# Patient Record
Sex: Male | Born: 1960 | Race: White | Hispanic: No | Marital: Single | Smoking: Former smoker
Health system: Southern US, Community
[De-identification: ages and names within clinical notes are randomized; demographics above are authoritative.]

## PROBLEM LIST (undated history)

## (undated) DIAGNOSIS — E119 Type 2 diabetes mellitus without complications: Secondary | ICD-10-CM

## (undated) DIAGNOSIS — M1990 Unspecified osteoarthritis, unspecified site: Secondary | ICD-10-CM

## (undated) DIAGNOSIS — F32A Depression, unspecified: Secondary | ICD-10-CM

## (undated) DIAGNOSIS — K7460 Unspecified cirrhosis of liver: Secondary | ICD-10-CM

## (undated) DIAGNOSIS — F329 Major depressive disorder, single episode, unspecified: Secondary | ICD-10-CM

## (undated) HISTORY — DX: Type 2 diabetes mellitus without complications: E11.9

## (undated) HISTORY — DX: Unspecified cirrhosis of liver: K74.60

---

## 2010-03-21 ENCOUNTER — Ambulatory Visit (HOSPITAL_COMMUNITY): Admission: RE | Admit: 2010-03-21 | Discharge: 2010-03-21 | Payer: Self-pay | Admitting: Family Medicine

## 2010-05-05 ENCOUNTER — Ambulatory Visit (HOSPITAL_COMMUNITY): Admission: RE | Admit: 2010-05-05 | Discharge: 2010-05-05 | Payer: Self-pay | Admitting: Family Medicine

## 2014-08-11 ENCOUNTER — Other Ambulatory Visit (HOSPITAL_COMMUNITY): Payer: Self-pay | Admitting: Family Medicine

## 2014-08-11 ENCOUNTER — Ambulatory Visit (HOSPITAL_COMMUNITY)
Admission: RE | Admit: 2014-08-11 | Discharge: 2014-08-11 | Disposition: A | Discharge status: Home / Self Care | Payer: BC Managed Care – PPO | Source: Ambulatory Visit | Attending: Family Medicine | Admitting: Family Medicine

## 2014-08-11 DIAGNOSIS — R609 Edema, unspecified: Secondary | ICD-10-CM

## 2014-08-11 DIAGNOSIS — M7989 Other specified soft tissue disorders: Secondary | ICD-10-CM | POA: Diagnosis present

## 2018-02-19 ENCOUNTER — Other Ambulatory Visit (HOSPITAL_COMMUNITY)
Admission: RE | Admit: 2018-02-19 | Discharge: 2018-02-19 | Disposition: A | Discharge status: Home / Self Care | Payer: BLUE CROSS/BLUE SHIELD | Source: Ambulatory Visit | Attending: Family Medicine | Admitting: Family Medicine

## 2018-02-19 ENCOUNTER — Ambulatory Visit (HOSPITAL_COMMUNITY)
Admission: RE | Admit: 2018-02-19 | Discharge: 2018-02-19 | Disposition: A | Discharge status: Home / Self Care | Payer: BLUE CROSS/BLUE SHIELD | Source: Ambulatory Visit | Attending: Family Medicine | Admitting: Family Medicine

## 2018-02-19 ENCOUNTER — Other Ambulatory Visit (HOSPITAL_COMMUNITY): Payer: Self-pay | Admitting: Family Medicine

## 2018-02-19 ENCOUNTER — Ambulatory Visit (HOSPITAL_COMMUNITY): Admission: RE | Admit: 2018-02-19 | Payer: BLUE CROSS/BLUE SHIELD | Source: Ambulatory Visit

## 2018-02-19 DIAGNOSIS — R188 Other ascites: Secondary | ICD-10-CM

## 2018-02-19 DIAGNOSIS — R69 Illness, unspecified: Secondary | ICD-10-CM | POA: Diagnosis present

## 2018-02-19 DIAGNOSIS — R161 Splenomegaly, not elsewhere classified: Secondary | ICD-10-CM | POA: Diagnosis not present

## 2018-02-19 LAB — CBC
HCT: 38.7 % — ABNORMAL LOW (ref 39.0–52.0)
HEMOGLOBIN: 12.3 g/dL — AB (ref 13.0–17.0)
MCH: 27.7 pg (ref 26.0–34.0)
MCHC: 31.8 g/dL (ref 30.0–36.0)
MCV: 87.2 fL (ref 78.0–100.0)
Platelets: 79 10*3/uL — ABNORMAL LOW (ref 150–400)
RBC: 4.44 MIL/uL (ref 4.22–5.81)
RDW: 16.8 % — ABNORMAL HIGH (ref 11.5–15.5)
WBC: 4.2 10*3/uL (ref 4.0–10.5)

## 2018-02-19 LAB — COMPREHENSIVE METABOLIC PANEL
ALK PHOS: 136 U/L — AB (ref 38–126)
ALT: 40 U/L (ref 17–63)
AST: 57 U/L — AB (ref 15–41)
Albumin: 3.2 g/dL — ABNORMAL LOW (ref 3.5–5.0)
Anion gap: 9 (ref 5–15)
BUN: 14 mg/dL (ref 6–20)
CO2: 25 mmol/L (ref 22–32)
CREATININE: 0.59 mg/dL — AB (ref 0.61–1.24)
Calcium: 8.9 mg/dL (ref 8.9–10.3)
Chloride: 103 mmol/L (ref 101–111)
GFR calc non Af Amer: >60 mL/min (ref >60)
GLUCOSE: 122 mg/dL — AB (ref 65–99)
Potassium: 4.1 mmol/L (ref 3.5–5.1)
SODIUM: 137 mmol/L (ref 135–145)
Total Bilirubin: 1.8 mg/dL — ABNORMAL HIGH (ref 0.3–1.2)
Total Protein: 5.8 g/dL — ABNORMAL LOW (ref 6.5–8.1)

## 2018-02-19 LAB — SEDIMENTATION RATE: Sed Rate: 9 mm/hr (ref 0–16)

## 2018-02-19 LAB — IRON AND TIBC
Iron: 55 ug/dL (ref 45–182)
SATURATION RATIOS: 16 % — AB (ref 17.9–39.5)
TIBC: 349 ug/dL (ref 250–450)
UIBC: 294 ug/dL

## 2018-02-19 LAB — BRAIN NATRIURETIC PEPTIDE: B Natriuretic Peptide: 48 pg/mL (ref 0.0–100.0)

## 2018-02-19 LAB — RETICULOCYTES
RBC.: 4.44 MIL/uL (ref 4.22–5.81)
RETIC CT PCT: 2 % (ref 0.4–3.1)
Retic Count, Absolute: 88.8 10*3/uL (ref 19.0–186.0)

## 2018-02-19 LAB — FERRITIN: FERRITIN: 75 ng/mL (ref 24–336)

## 2018-02-19 LAB — FOLATE: Folate: 9.4 ng/mL (ref >5.9)

## 2018-02-19 LAB — VITAMIN B12: VITAMIN B 12: 589 pg/mL (ref 180–914)

## 2018-02-19 LAB — HEMOGLOBIN A1C
Hgb A1c MFr Bld: 6.3 % — ABNORMAL HIGH (ref 4.8–5.6)
Mean Plasma Glucose: 134.11 mg/dL

## 2018-02-19 LAB — C-REACTIVE PROTEIN: CRP: 1 mg/dL — ABNORMAL HIGH (ref <1.0)

## 2018-02-20 ENCOUNTER — Other Ambulatory Visit (HOSPITAL_COMMUNITY): Payer: Self-pay | Admitting: Family Medicine

## 2018-02-20 ENCOUNTER — Ambulatory Visit (HOSPITAL_COMMUNITY)
Admission: RE | Admit: 2018-02-20 | Discharge: 2018-02-20 | Disposition: A | Discharge status: Home / Self Care | Payer: BLUE CROSS/BLUE SHIELD | Source: Ambulatory Visit | Attending: Nurse Practitioner | Admitting: Nurse Practitioner

## 2018-02-20 ENCOUNTER — Encounter (HOSPITAL_COMMUNITY): Payer: Self-pay

## 2018-02-20 ENCOUNTER — Ambulatory Visit (HOSPITAL_COMMUNITY)
Admission: RE | Admit: 2018-02-20 | Discharge: 2018-02-20 | Disposition: A | Discharge status: Home / Self Care | Payer: BLUE CROSS/BLUE SHIELD | Source: Ambulatory Visit | Attending: Family Medicine | Admitting: Family Medicine

## 2018-02-20 DIAGNOSIS — R188 Other ascites: Secondary | ICD-10-CM

## 2018-02-20 DIAGNOSIS — K7460 Unspecified cirrhosis of liver: Secondary | ICD-10-CM | POA: Insufficient documentation

## 2018-02-20 LAB — GLUCOSE, PLEURAL OR PERITONEAL FLUID: Glucose, Fluid: 143 mg/dL

## 2018-02-20 LAB — BODY FLUID CELL COUNT WITH DIFFERENTIAL
Eos, Fluid: 0 %
Lymphs, Fluid: 51 %
Monocyte-Macrophage-Serous Fluid: 47 % — ABNORMAL LOW (ref 50–90)
Neutrophil Count, Fluid: 2 % (ref 0–25)
Total Nucleated Cell Count, Fluid: 146 cu mm (ref 0–1000)

## 2018-02-20 LAB — HEPATITIS PANEL, ACUTE
HEP A IGM: NEGATIVE
HEP B C IGM: NEGATIVE
Hepatitis B Surface Ag: NEGATIVE

## 2018-02-20 LAB — GRAM STAIN: GRAM STAIN: NONE SEEN

## 2018-02-20 LAB — ALBUMIN, PLEURAL OR PERITONEAL FLUID: Albumin, Fluid: <1 g/dL

## 2018-02-20 LAB — PROTEIN, PLEURAL OR PERITONEAL FLUID: Total protein, fluid: <3 g/dL

## 2018-02-20 MED ORDER — ALBUMIN HUMAN 25 % IV SOLN
50.0000 g | Freq: Once | INTRAVENOUS | Status: AC
  Administered 2018-02-20: 50 g via INTRAVENOUS

## 2018-02-20 MED ORDER — ALBUMIN HUMAN 25 % IV SOLN
INTRAVENOUS | Status: AC
  Filled 2018-02-20: qty 200

## 2018-02-20 MED ORDER — IOPAMIDOL (ISOVUE-300) INJECTION 61%
100.0000 mL | Freq: Once | INTRAVENOUS | Status: AC | PRN
  Administered 2018-02-20: 100 mL via INTRAVENOUS

## 2018-02-20 NOTE — Procedures (Signed)
PreOperative Dx: Ascites Postoperative Dx: Ascites Procedure:   US guided paracentesis Radiologist:  Thornton Papas Anesthesia:  10 ml of1% lidocaine Specimen:  4.0 L of slightly cloudy yellow ascitic fluid EBL:   < 1 ml Complications: None

## 2018-02-21 LAB — LD, BODY FLUID (OTHER): LD, BODY FLUID: 98 IU/L

## 2018-02-21 LAB — AMYLASE, BODY FLUID (OTHER): AMYLASE, BODY FLUID: 23 U/L

## 2018-02-24 LAB — PATHOLOGIST SMEAR REVIEW

## 2018-02-25 LAB — CULTURE, BODY FLUID-BOTTLE: CULTURE: NO GROWTH

## 2018-02-25 LAB — CULTURE, BODY FLUID W GRAM STAIN -BOTTLE

## 2018-02-25 LAB — TOTAL BILIRUBIN, BODY FLUID: Total bilirubin, fluid: 0.2 mg/dL

## 2018-03-24 ENCOUNTER — Telehealth (INDEPENDENT_AMBULATORY_CARE_PROVIDER_SITE_OTHER): Payer: Self-pay | Admitting: Internal Medicine

## 2018-03-24 ENCOUNTER — Encounter (INDEPENDENT_AMBULATORY_CARE_PROVIDER_SITE_OTHER): Payer: Self-pay | Admitting: Internal Medicine

## 2018-03-24 ENCOUNTER — Ambulatory Visit (INDEPENDENT_AMBULATORY_CARE_PROVIDER_SITE_OTHER): Payer: BLUE CROSS/BLUE SHIELD | Admitting: Internal Medicine

## 2018-03-24 VITALS — BP 118/90 | HR 62 | Temp 98.4°F | Ht 73.0 in | Wt 233.2 lb

## 2018-03-24 DIAGNOSIS — R188 Other ascites: Secondary | ICD-10-CM | POA: Diagnosis not present

## 2018-03-24 DIAGNOSIS — K7460 Unspecified cirrhosis of liver: Secondary | ICD-10-CM | POA: Diagnosis not present

## 2018-03-24 DIAGNOSIS — E119 Type 2 diabetes mellitus without complications: Secondary | ICD-10-CM | POA: Insufficient documentation

## 2018-03-24 MED ORDER — SPIRONOLACTONE 100 MG PO TABS: 100.0000 mg | ORAL_TABLET | Freq: Every day | ORAL | 3 refills | Status: DC

## 2018-03-24 NOTE — Addendum Note (Signed)
Addended by: Butch Penny on: 03/24/2018 04:06 PM   Modules accepted: Orders

## 2018-03-24 NOTE — Telephone Encounter (Signed)
err

## 2018-03-24 NOTE — Progress Notes (Signed)
Subjective:    Patient ID: Lawrence Rogers, male    DOB: [birth year], 57 y.o.   MRN: [id number]  HPI Referred by Dr. Karie Kirks for cirrhosis/ascites.Underwent 1st paracentesis (new onset ascites and new diagnosis of cirrhosis) which was negative for SBP. Never has had any trouble up until this point. No hx of IV drugs or etoh. No family hx of liver disease. Wt 261 in March. Today his weight is 233.  Appetite is good. No change in his bowels.   02/19/2018 Acute hepatitis panel negative. Ferritin 75, Iron 55, TIBC 349, UIBC 294.  CRP 1.0, sedrate 9, BNP 48, H and H 12.3 and 38.7 Platelet ct low at 79.  Vitamin B12 589  CBC    Component Value Date/Time   WBC 4.2 02/19/2018 1105   RBC 4.44 02/19/2018 1105   RBC 4.44 02/19/2018 1105   HGB 12.3 (L) 02/19/2018 1105   HCT 38.7 (L) 02/19/2018 1105   PLT 79 (L) 02/19/2018 1105   MCV 87.2 02/19/2018 1105   MCH 27.7 02/19/2018 1105   MCHC 31.8 02/19/2018 1105   RDW 16.8 (H) 02/19/2018 1105     CMP Latest Ref Rng & Units 02/19/2018  Glucose 65 - 99 mg/dL 122(H)  BUN 6 - 20 mg/dL 14  Creatinine 0.61 - 1.24 mg/dL 0.59(L)  Sodium 135 - 145 mmol/L 137  Potassium 3.5 - 5.1 mmol/L 4.1  Chloride 101 - 111 mmol/L 103  CO2 22 - 32 mmol/L 25  Calcium 8.9 - 10.3 mg/dL 8.9  Total Protein 6.5 - 8.1 g/dL 5.8(L)  Total Bilirubin 0.3 - 1.2 mg/dL 1.8(H)  Alkaline Phos 38 - 126 U/L 136(H)  AST 15 - 41 U/L 57(H)  ALT 17 - 63 U/L 40       02/20/2018 CT abdomen w contrast: abdominal swelling for 2 months 1. Cirrhosis of the liver. 2. Stigmata of portal venous hypertension including splenomegaly, esophageal varices and ascites.   02/19/2018 US abdomen: ascites: Large amt of ascites present.  CBC 108m. Portal vein is patient on color Doppler imaging with normal direction of blood flow towards the liver.  Cirrhosis. No suspicious hepatic masses, Splenomegaly.    Review of Systems Past Medical History:  Diagnosis Date  . Cirrhosis ([place]   .  Diabetes ([facility]       Not on File  Current Outpatient Medications on File Prior to Visit  Medication Sig Dispense Refill  . furosemide (LASIX) 40 MG tablet Take 40 mg by mouth.    . metFORMIN (GLUCOPHAGE) 1000 MG tablet Take 1,000 mg by mouth 2 (two) times daily with a meal.    . potassium chloride (K-DUR) 10 MEQ tablet Take 20 mEq by mouth daily.    .Marland Kitchenspironolactone (ALDACTONE) 100 MG tablet Take 50 mg by mouth daily.     No current facility-administered medications on file prior to visit.         Objective:   Physical Exam Blood pressure 118/90, pulse 62, temperature 98.4 F (36.9 C), height 6' 1"  (1.854 m), weight 233 lb 3.2 oz (105.8 kg). Alert and oriented. Skin warm and dry. Oral mucosa is moist.   . Sclera anicteric, conjunctivae is pink. Thyroid not enlarged. No cervical lymphadenopathy. Lungs clear. Heart regular rate and rhythm.  Abdomen is soft. Bowel sounds are positive. No hepatomegaly. No abdominal masses felt. No tenderness., Abdomen is distended but not tense. 2-3+ edema to lower extremities.            Assessment & Plan:  Cirrhosis/Ascites. Will need to be screened for varices.(EGD with possible banding) Will rule out auto immune process.  Thrombocytopenia related to his liver disease. PT/INR, AFP, Ceruloplasmin, Alpha 1, SMA, ANA  After labs are back, will probably need a liver biopsy. Will discuss with Dr. Laural Golden.

## 2018-03-24 NOTE — Patient Instructions (Addendum)
Lasix 49m spironolactone 1024m Hepatic function in 2 weeks .Stop the potassium.

## 2018-03-27 ENCOUNTER — Encounter (HOSPITAL_COMMUNITY): Payer: Self-pay

## 2018-03-27 ENCOUNTER — Ambulatory Visit (HOSPITAL_COMMUNITY)
Admission: RE | Admit: 2018-03-27 | Discharge: 2018-03-27 | Disposition: A | Discharge status: Home / Self Care | Payer: BLUE CROSS/BLUE SHIELD | Source: Ambulatory Visit | Attending: Internal Medicine | Admitting: Internal Medicine

## 2018-03-27 DIAGNOSIS — K7460 Unspecified cirrhosis of liver: Secondary | ICD-10-CM | POA: Diagnosis present

## 2018-03-27 DIAGNOSIS — R188 Other ascites: Secondary | ICD-10-CM | POA: Insufficient documentation

## 2018-03-27 LAB — PROTIME-INR
INR: 1
PROTHROMBIN TIME: 11 s (ref 9.0–11.5)

## 2018-03-27 LAB — ALPHA-1-ANTITRYPSIN: A1 ANTITRYPSIN SER: 182 mg/dL (ref 83–199)

## 2018-03-27 LAB — ANTI-SMOOTH MUSCLE ANTIBODY, IGG

## 2018-03-27 LAB — BODY FLUID CELL COUNT WITH DIFFERENTIAL
Eos, Fluid: 0 %
LYMPHS FL: 56 %
MONOCYTE-MACROPHAGE-SEROUS FLUID: 40 % — AB (ref 50–90)
NEUTROPHIL FLUID: 4 % (ref 0–25)
WBC FLUID: 316 uL (ref 0–1000)

## 2018-03-27 LAB — ANA: ANA: NEGATIVE

## 2018-03-27 LAB — AFP TUMOR MARKER: AFP-Tumor Marker: 3.2 ng/mL (ref <6.1)

## 2018-03-27 LAB — GRAM STAIN: Gram Stain: NONE SEEN

## 2018-03-27 LAB — CERULOPLASMIN: CERULOPLASMIN: 32 mg/dL (ref 18–36)

## 2018-03-27 MED ORDER — ALBUMIN HUMAN 25 % IV SOLN
INTRAVENOUS | Status: AC
  Filled 2018-03-27: qty 200

## 2018-03-27 MED ORDER — ALBUMIN HUMAN 25 % IV SOLN
50.0000 g | Freq: Once | INTRAVENOUS | Status: AC
  Administered 2018-03-27: 50 g via INTRAVENOUS

## 2018-03-27 NOTE — Procedures (Signed)
   US guided RLQ paracentesis  2.8 L yellow fluid Sent for labs per MD  Tolerated well

## 2018-03-27 NOTE — Progress Notes (Signed)
Paracentesis complete no signs of distress.

## 2018-04-01 ENCOUNTER — Other Ambulatory Visit (INDEPENDENT_AMBULATORY_CARE_PROVIDER_SITE_OTHER): Payer: Self-pay | Admitting: *Deleted

## 2018-04-01 ENCOUNTER — Telehealth (INDEPENDENT_AMBULATORY_CARE_PROVIDER_SITE_OTHER): Payer: Self-pay | Admitting: Internal Medicine

## 2018-04-01 ENCOUNTER — Encounter (INDEPENDENT_AMBULATORY_CARE_PROVIDER_SITE_OTHER): Payer: Self-pay | Admitting: *Deleted

## 2018-04-01 DIAGNOSIS — K7460 Unspecified cirrhosis of liver: Secondary | ICD-10-CM

## 2018-04-01 LAB — CULTURE, BODY FLUID-BOTTLE

## 2018-04-01 LAB — CULTURE, BODY FLUID W GRAM STAIN -BOTTLE: Culture: NO GROWTH

## 2018-04-01 NOTE — Telephone Encounter (Signed)
Wt today is 226. At OV wt 233

## 2018-04-01 NOTE — Telephone Encounter (Signed)
C-Met noted for April 15 2018 and a letter has been sent.

## 2018-04-01 NOTE — Progress Notes (Signed)
ompre

## 2018-04-01 NOTE — Telephone Encounter (Signed)
Tammy, Cmet in 2 weeks. Please send letter

## 2018-04-02 ENCOUNTER — Other Ambulatory Visit (INDEPENDENT_AMBULATORY_CARE_PROVIDER_SITE_OTHER): Payer: Self-pay | Admitting: Internal Medicine

## 2018-04-02 ENCOUNTER — Other Ambulatory Visit (INDEPENDENT_AMBULATORY_CARE_PROVIDER_SITE_OTHER): Payer: Self-pay | Admitting: *Deleted

## 2018-04-02 DIAGNOSIS — K7469 Other cirrhosis of liver: Secondary | ICD-10-CM

## 2018-04-02 NOTE — Progress Notes (Signed)
bi

## 2018-04-10 ENCOUNTER — Encounter (INDEPENDENT_AMBULATORY_CARE_PROVIDER_SITE_OTHER): Payer: Self-pay | Admitting: *Deleted

## 2018-04-10 ENCOUNTER — Other Ambulatory Visit (INDEPENDENT_AMBULATORY_CARE_PROVIDER_SITE_OTHER): Payer: Self-pay | Admitting: *Deleted

## 2018-04-10 DIAGNOSIS — K7469 Other cirrhosis of liver: Secondary | ICD-10-CM

## 2018-04-16 ENCOUNTER — Other Ambulatory Visit: Payer: Self-pay | Admitting: Radiology

## 2018-04-17 LAB — COMPREHENSIVE METABOLIC PANEL
AG Ratio: 1.9 (calc) (ref 1.0–2.5)
ALBUMIN MSPROF: 3.6 g/dL (ref 3.6–5.1)
ALT: 41 U/L (ref 9–46)
AST: 48 U/L — AB (ref 10–35)
Alkaline phosphatase (APISO): 117 U/L — ABNORMAL HIGH (ref 40–115)
BUN: 12 mg/dL (ref 7–25)
CHLORIDE: 107 mmol/L (ref 98–110)
CO2: 26 mmol/L (ref 20–32)
Calcium: 9.1 mg/dL (ref 8.6–10.3)
Creat: 0.71 mg/dL (ref 0.70–1.33)
GLOBULIN: 1.9 g/dL (ref 1.9–3.7)
GLUCOSE: 155 mg/dL — AB (ref 65–139)
POTASSIUM: 4 mmol/L (ref 3.5–5.3)
SODIUM: 138 mmol/L (ref 135–146)
TOTAL PROTEIN: 5.5 g/dL — AB (ref 6.1–8.1)
Total Bilirubin: 0.9 mg/dL (ref 0.2–1.2)

## 2018-04-18 ENCOUNTER — Ambulatory Visit (HOSPITAL_COMMUNITY)
Admission: RE | Admit: 2018-04-18 | Discharge: 2018-04-18 | Disposition: A | Discharge status: Home / Self Care | Payer: BLUE CROSS/BLUE SHIELD | Source: Ambulatory Visit | Attending: Internal Medicine | Admitting: Internal Medicine

## 2018-04-18 ENCOUNTER — Encounter (HOSPITAL_COMMUNITY): Payer: Self-pay

## 2018-04-18 ENCOUNTER — Other Ambulatory Visit (INDEPENDENT_AMBULATORY_CARE_PROVIDER_SITE_OTHER): Payer: Self-pay | Admitting: Internal Medicine

## 2018-04-18 DIAGNOSIS — K766 Portal hypertension: Secondary | ICD-10-CM | POA: Insufficient documentation

## 2018-04-18 DIAGNOSIS — R161 Splenomegaly, not elsewhere classified: Secondary | ICD-10-CM | POA: Diagnosis not present

## 2018-04-18 DIAGNOSIS — D696 Thrombocytopenia, unspecified: Secondary | ICD-10-CM | POA: Diagnosis not present

## 2018-04-18 DIAGNOSIS — Z7984 Long term (current) use of oral hypoglycemic drugs: Secondary | ICD-10-CM | POA: Insufficient documentation

## 2018-04-18 DIAGNOSIS — E119 Type 2 diabetes mellitus without complications: Secondary | ICD-10-CM | POA: Diagnosis not present

## 2018-04-18 DIAGNOSIS — K7469 Other cirrhosis of liver: Secondary | ICD-10-CM

## 2018-04-18 DIAGNOSIS — I8510 Secondary esophageal varices without bleeding: Secondary | ICD-10-CM | POA: Insufficient documentation

## 2018-04-18 DIAGNOSIS — R188 Other ascites: Secondary | ICD-10-CM | POA: Diagnosis not present

## 2018-04-18 HISTORY — PX: IR US GUIDE VASC ACCESS RIGHT: IMG2390

## 2018-04-18 HISTORY — PX: IR PARACENTESIS: IMG2679

## 2018-04-18 HISTORY — PX: IR TRANSCATHETER BX: IMG713

## 2018-04-18 HISTORY — PX: IR VENOGRAM HEPATIC W HEMODYNAMIC EVALUATION: IMG692

## 2018-04-18 LAB — CBC WITH DIFFERENTIAL/PLATELET
BASOS ABS: 0 10*3/uL (ref 0.0–0.1)
BASOS PCT: 0 %
EOS PCT: 3 %
Eosinophils Absolute: 0.1 10*3/uL (ref 0.0–0.7)
HCT: 39.3 % (ref 39.0–52.0)
Hemoglobin: 12.7 g/dL — ABNORMAL LOW (ref 13.0–17.0)
Lymphocytes Relative: 35 %
Lymphs Abs: 1.4 10*3/uL (ref 0.7–4.0)
MCH: 27.9 pg (ref 26.0–34.0)
MCHC: 32.3 g/dL (ref 30.0–36.0)
MCV: 86.2 fL (ref 78.0–100.0)
MONO ABS: 0.3 10*3/uL (ref 0.1–1.0)
MONOS PCT: 6 %
Neutro Abs: 2.3 10*3/uL (ref 1.7–7.7)
Neutrophils Relative %: 56 %
PLATELETS: 74 10*3/uL — AB (ref 150–400)
RBC: 4.56 MIL/uL (ref 4.22–5.81)
RDW: 15 % (ref 11.5–15.5)
WBC: 4.1 10*3/uL (ref 4.0–10.5)

## 2018-04-18 LAB — COMPREHENSIVE METABOLIC PANEL
ALBUMIN: 3.6 g/dL (ref 3.5–5.0)
ALT: 50 U/L (ref 17–63)
ANION GAP: 10 (ref 5–15)
AST: 59 U/L — AB (ref 15–41)
Alkaline Phosphatase: 109 U/L (ref 38–126)
BUN: 12 mg/dL (ref 6–20)
CO2: 22 mmol/L (ref 22–32)
Calcium: 9.4 mg/dL (ref 8.9–10.3)
Chloride: 106 mmol/L (ref 101–111)
Creatinine, Ser: 0.65 mg/dL (ref 0.61–1.24)
GFR calc Af Amer: >60 mL/min (ref >60)
Glucose, Bld: 146 mg/dL — ABNORMAL HIGH (ref 65–99)
POTASSIUM: 3.9 mmol/L (ref 3.5–5.1)
Sodium: 138 mmol/L (ref 135–145)
Total Bilirubin: 1.8 mg/dL — ABNORMAL HIGH (ref 0.3–1.2)
Total Protein: 6.3 g/dL — ABNORMAL LOW (ref 6.5–8.1)

## 2018-04-18 LAB — PROTIME-INR
INR: 1.1
PROTHROMBIN TIME: 14.2 s (ref 11.4–15.2)

## 2018-04-18 LAB — GLUCOSE, CAPILLARY: GLUCOSE-CAPILLARY: 138 mg/dL — AB (ref 65–99)

## 2018-04-18 MED ORDER — IOPAMIDOL (ISOVUE-300) INJECTION 61%
INTRAVENOUS | Status: AC
  Filled 2018-04-18: qty 50

## 2018-04-18 MED ORDER — FENTANYL CITRATE (PF) 100 MCG/2ML IJ SOLN
INTRAMUSCULAR | Status: AC
  Filled 2018-04-18: qty 4

## 2018-04-18 MED ORDER — MIDAZOLAM HCL 2 MG/2ML IJ SOLN
INTRAMUSCULAR | Status: AC | PRN
  Administered 2018-04-18: 2 mg via INTRAVENOUS
  Administered 2018-04-18: 1 mg via INTRAVENOUS

## 2018-04-18 MED ORDER — LIDOCAINE HCL (PF) 1 % IJ SOLN
INTRAMUSCULAR | Status: AC | PRN
  Administered 2018-04-18: 10 mL

## 2018-04-18 MED ORDER — IOPAMIDOL (ISOVUE-300) INJECTION 61%: 30.0000 mL | Freq: Once | INTRAVENOUS | Status: DC | PRN

## 2018-04-18 MED ORDER — SODIUM CHLORIDE 0.9 % IV SOLN
INTRAVENOUS | Status: DC
  Administered 2018-04-18: 08:00:00 via INTRAVENOUS

## 2018-04-18 MED ORDER — IOPAMIDOL (ISOVUE-300) INJECTION 61%
50.0000 mL | Freq: Once | INTRAVENOUS | Status: AC | PRN
  Administered 2018-04-18: 20 mL

## 2018-04-18 MED ORDER — MIDAZOLAM HCL 2 MG/2ML IJ SOLN
INTRAMUSCULAR | Status: AC
  Filled 2018-04-18: qty 6

## 2018-04-18 MED ORDER — LIDOCAINE HCL (PF) 1 % IJ SOLN
INTRAMUSCULAR | Status: AC | PRN
  Administered 2018-04-18: 5 mL

## 2018-04-18 MED ORDER — LIDOCAINE HCL (PF) 1 % IJ SOLN
INTRAMUSCULAR | Status: AC
  Filled 2018-04-18: qty 30

## 2018-04-18 MED ORDER — FENTANYL CITRATE (PF) 100 MCG/2ML IJ SOLN
INTRAMUSCULAR | Status: AC | PRN
  Administered 2018-04-18 (×2): 50 ug via INTRAVENOUS

## 2018-04-18 NOTE — H&P (Signed)
Referring Physician(s): Setzer,Terri L  Supervising Physician: Sandi Mariscal  Patient Status:  [facility] OP  Chief Complaint:  "I'm having a liver biopsy"  Subjective: Patient familiar to IR service from prior paracenteses, latest on 03/27/2018 yielding 2.8 L.  He has a history of cirrhosis by imaging, portal hypertension, elevated liver function tests, splenomegaly, esophageal varices, ascites and thrombocytopenia.  He presents today for transjugular liver biopsy for further evaluation.  He currently denies fever, headache, chest pain, dyspnea, cough, nausea, vomiting or bleeding.  He does have some abdominal fullness/bloating as well as occasional back pain and lower extremity swelling.  Past Medical History:  Diagnosis Date  . Cirrhosis ([place])   . Diabetes ([place])   History reviewed. No pertinent surgical history.   Allergies: Patient has no known allergies.  Medications: Prior to Admission medications   Medication Sig Start Date End Date Taking? Authorizing Provider  furosemide (LASIX) 40 MG tablet Take 40 mg by mouth.   Yes [provider]  metFORMIN (GLUCOPHAGE) 1000 MG tablet Take 1,000 mg by mouth 2 (two) times daily with a meal.   Yes [provider]  spironolactone (ALDACTONE) 100 MG tablet Take 1 tablet (100 mg total) by mouth daily. 03/24/18  Yes Setzer, Terri L, NP  potassium chloride (K-DUR) 10 MEQ tablet Take 20 mEq by mouth daily.    [provider]     Vital Signs: BP 131/70 (BP Location: Left Arm)   Pulse 70   Temp 98.4 F (36.9 C) (Oral)   Resp 16   SpO2 99%   Physical Exam awake, alert.  Chest with slightly diminished breath sounds left base, right clear.  Heart with regular rate and rhythm.  Abdomen distended,  positive bowel sounds, nontender.  Bilateral lower extremity edema noted  Imaging: No results found.  Labs:  CBC: Recent Labs    02/19/18 1105 04/18/18 0753  WBC 4.2 4.1  HGB 12.3* 12.7*  HCT 38.7* 39.3  PLT 79*  74*    COAGS: Recent Labs    03/24/18 1423 04/18/18 0753  INR 1.0 1.10    BMP: Recent Labs    02/19/18 1105 04/17/18 0734 04/18/18 0753  NA 137 138 138  K 4.1 4.0 3.9  CL 103 107 106  CO2 25 26 22   GLUCOSE 122* 155* 146*  BUN 14 12 12   CALCIUM 8.9 9.1 9.4  CREATININE 0.59* 0.71 0.65  GFRNONAA >60  --  >60  GFRAA >60  --  >60    LIVER FUNCTION TESTS: Recent Labs    02/19/18 1105 04/17/18 0734 04/18/18 0753  BILITOT 1.8* 0.9 1.8*  AST 57* 48* 59*  ALT 40 41 50  ALKPHOS 136*  --  109  PROT 5.8* 5.5* 6.3*  ALBUMIN 3.2*  --  3.6    Assessment and Plan: Pt with history of cirrhosis by imaging, portal hypertension, elevated liver function tests, splenomegaly, esophageal varices, ascites and thrombocytopenia.  He presents today for transjugular liver biopsy for further evaluation.Risks and benefits discussed with the patient including, but not limited to bleeding, infection, damage to adjacent structures or low yield requiring additional tests.  All of the patient's questions were answered, patient is agreeable to proceed. Consent signed and in chart.     Electronically Signed: D. Rowe Robert, PA-C 04/18/2018, 8:40 AM   I spent a total of 25 minutes at the the patient's bedside AND on the patient's hospital floor or unit, greater than 50% of which was counseling/coordinating care for transjugular  liver biopsy

## 2018-04-18 NOTE — Procedures (Signed)
Pre procedural Dx: Cirrhosis, ascites  Post procedural Dx: Same  Technically successful fluoro guided transjugular liver biopsy. Technically successful US guided paracentesis.    EBL: None.   Complications: None immediate.   Ronny Bacon, MD Pager #: [phone]

## 2018-04-18 NOTE — Discharge Instructions (Signed)
Please keep dressing covered for at least 24 hours. You may shower after 24 hours.    Liver Biopsy, Care After These instructions give you information on caring for yourself after your procedure. Your doctor may also give you more specific instructions. Call your doctor if you have any problems or questions after your procedure. Follow these instructions at home:  Rest at home for 1-2 days or as told by your doctor.  Have someone stay with you for at least 24 hours.  Do not do these things in the first 24 hours: ? Drive. ? Use machinery. ? Take care of other people. ? Sign legal documents. ? Take a bath or shower.  There are many different ways to close and cover a cut (incision). For example, a cut can be closed with stitches, skin glue, or adhesive strips. Follow your doctor's instructions on: ? Taking care of your cut. ? Changing and removing your bandage (dressing). ? Removing whatever was used to close your cut.  Do not drink alcohol in the first week.  Do not lift more than 5 pounds or play contact sports for the first 2 weeks.  Take medicines only as told by your doctor. For 1 week, do not take medicine that has aspirin in it or medicines like ibuprofen.  Get your test results. Contact a doctor if:  A cut bleeds and leaves more than just a small spot of blood.  A cut is red, puffs up (swells), or hurts more than before.  Fluid or something else comes from a cut.  A cut smells bad.  You have a fever or chills. Get help right away if:  You have swelling, bloating, or pain in your belly (abdomen).  You get dizzy or faint.  You have a rash.  You feel sick to your stomach (nauseous) or throw up (vomit).  You have trouble breathing, feel short of breath, or feel faint.  Your chest hurts.  You have problems talking or seeing.  You have trouble balancing or moving your arms or legs. This information is not intended to replace advice given to you by your  health care provider. Make sure you discuss any questions you have with your health care provider. Document Released: 09/11/2008 Document Revised: 05/10/2016 Document Reviewed: 01/29/2014 Elsevier Interactive Patient Education  2018 [employer]. Paracentesis Paracentesis is a procedure to remove excess fluid (ascites) from the belly (abdomen). Ascites can result from certain conditions, such as infection, inflammation, abdominal injury, heart failure, chronic scarring of the liver (cirrhosis), or cancer. Ascites is removed using a needle that is inserted through the skin and tissue into the abdomen. This procedure may be done:  To determine the cause of the ascites.  To relieve symptoms that are caused by the ascites, such as pain or shortness of breath.  To see if there is bleeding after an abdominal injury.  Tell a health care provider about:  Any allergies you have.  All medicines you are taking, including vitamins, herbs, eye drops, creams, and over-the-counter medicines.  Any problems you or family members have had with anesthetic medicines.  Any blood disorders you have.  Any surgeries you have had.  Any medical conditions you have.  Whether you are pregnant or may be pregnant. What are the risks? Generally, this is a safe procedure. However, problems may occur, including:  Infection.  Bleeding.  Injury to an abdominal organ, such as the bowel (large intestine), liver, spleen, or bladder.  Low blood pressure (hypotension).  Spreading of cancer, if there are cancer cells in the abdominal fluid.  Mental status changes in people who have liver disease. These changes would be caused by shifts in the balance of fluids and minerals (electrolytes) in the body.  What happens before the procedure?  Ask your health care provider about: ? Changing or stopping your regular medicines. This is especially important if you are taking diabetes medicines or blood  thinners. ? Taking medicines such as aspirin and ibuprofen. These medicines can thin your blood. Do not take these medicines before your procedure if your health care provider instructs you not to.  A blood sample may be done to determine your blood clotting time.  You will be asked to urinate. What happens during the procedure?  You may be asked to lie on your back with your head raised (elevated).  To reduce your risk of infection: ? Your health care team will wash or sanitize their hands. ? Your skin will be washed with soap.  You will be given a medicine to numb the area (local anesthetic).  Your abdominal skin will be punctured with a needle or a scalpel.  A drainage tube will be inserted through the puncture site. Fluid will drain through the tube into a container.  After enough fluid has been removed, the tube will be removed.  A sample of the fluid will be sent for examination.  A bandage (dressing) will be placed over the puncture site. The procedure may vary among health care providers and hospitals. What happens after the procedure?  It is your responsibility to get your test results. Ask your health care provider or the department performing the test when your results will be ready. This information is not intended to replace advice given to you by your health care provider. Make sure you discuss any questions you have with your health care provider. Document Released: 06/18/2005 Document Revised: 05/10/2016 Document Reviewed: 02/15/2015 Elsevier Interactive Patient Education  2018 [place].    Moderate Conscious Sedation, Adult, Care After These instructions provide you with information about caring for yourself after your procedure. Your health care provider may also give you more specific instructions. Your treatment has been planned according to current medical practices, but problems sometimes occur. Call your health care provider if you have any problems or  questions after your procedure. What can I expect after the procedure? After your procedure, it is common:  To feel sleepy for several hours.  To feel clumsy and have poor balance for several hours.  To have poor judgment for several hours.  To vomit if you eat too soon.  Follow these instructions at home: For at least 24 hours after the procedure:   Do not: ? Participate in activities where you could fall or become injured. ? Drive. ? Use heavy machinery. ? Drink alcohol. ? Take sleeping pills or medicines that cause drowsiness. ? Make important decisions or sign legal documents. ? Take care of children on your own.  Rest. Eating and drinking  Follow the diet recommended by your health care provider.  If you vomit: ? Drink water, juice, or soup when you can drink without vomiting. ? Make sure you have little or no nausea before eating solid foods. General instructions  Have a responsible adult stay with you until you are awake and alert.  Take over-the-counter and prescription medicines only as told by your health care provider.  If you smoke, do not smoke without supervision.  Keep all follow-up visits  as told by your health care provider. This is important. Contact a health care provider if:  You keep feeling nauseous or you keep vomiting.  You feel light-headed.  You develop a rash.  You have a fever. Get help right away if:  You have trouble breathing. This information is not intended to replace advice given to you by your health care provider. Make sure you discuss any questions you have with your health care provider. Document Released: 09/23/2013 Document Revised: 05/07/2016 Document Reviewed: 03/24/2016 Elsevier Interactive Patient Education  [employer].

## 2018-04-30 ENCOUNTER — Telehealth (INDEPENDENT_AMBULATORY_CARE_PROVIDER_SITE_OTHER): Payer: Self-pay | Admitting: Internal Medicine

## 2018-04-30 NOTE — Telephone Encounter (Signed)
  Lawrence Rogers  OV in 3 months

## 2018-07-31 ENCOUNTER — Other Ambulatory Visit (INDEPENDENT_AMBULATORY_CARE_PROVIDER_SITE_OTHER): Payer: Self-pay | Admitting: Internal Medicine

## 2018-07-31 DIAGNOSIS — K7460 Unspecified cirrhosis of liver: Secondary | ICD-10-CM

## 2018-07-31 DIAGNOSIS — R188 Other ascites: Principal | ICD-10-CM

## 2018-08-04 ENCOUNTER — Ambulatory Visit (INDEPENDENT_AMBULATORY_CARE_PROVIDER_SITE_OTHER): Payer: BLUE CROSS/BLUE SHIELD | Admitting: Internal Medicine

## 2018-08-06 ENCOUNTER — Encounter (INDEPENDENT_AMBULATORY_CARE_PROVIDER_SITE_OTHER): Payer: Self-pay | Admitting: Internal Medicine

## 2018-08-06 ENCOUNTER — Encounter (INDEPENDENT_AMBULATORY_CARE_PROVIDER_SITE_OTHER): Payer: Self-pay | Admitting: *Deleted

## 2018-08-06 ENCOUNTER — Telehealth (INDEPENDENT_AMBULATORY_CARE_PROVIDER_SITE_OTHER): Payer: Self-pay | Admitting: Internal Medicine

## 2018-08-06 ENCOUNTER — Ambulatory Visit (INDEPENDENT_AMBULATORY_CARE_PROVIDER_SITE_OTHER): Payer: BLUE CROSS/BLUE SHIELD | Admitting: Internal Medicine

## 2018-08-06 VITALS — BP 132/78 | HR 64 | Resp 18 | Ht 73.0 in | Wt 229.9 lb

## 2018-08-06 DIAGNOSIS — K7460 Unspecified cirrhosis of liver: Secondary | ICD-10-CM

## 2018-08-06 NOTE — Progress Notes (Addendum)
Subjective:    Patient ID: Lawrence Rogers, male    DOB: [birth year], 57 y.o.   MRN: [id number]  HPI Here today for f/u. Last seen in April of this year as a new patient. Wt in April 233.  Today his weight is 229.9. New onset of cirrhosis/ascites. Underwent 1st paracentesis in March of 2019 with removal of 4 liters.Negative for SBP  Has undergone 3 paracentesis.    He states he is working 10 hrs a day [employer]. He denies any abdominal distention. His appetite is okay. BMs are normal. No melena or BRRB. No etoh in 30 years which was very minimal at that time.  No prior hx of IV drugs.  02/19/2018 HA1C 6.3 Meld score of 10.  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   03/24/2018 SMA less than 20, ANA negative, ceruloplasmin 32, alpha 1 antitrypsin 182,  AFP 3.2, PT/INR 1.0 and 11.0  02/19/2018 Acute hepatitis panel negative. Ferritin 75, Iron 55, TIBC 349, UIBC 294.  CRP 1.0, sedrate 9, BNP 48, H and H 12.3 and 38.7 Platelet ct low at 79.  Vitamin B12 589 CMP Latest Ref Rng & Units 04/18/2018 04/17/2018 02/19/2018  Glucose 65 - 99 mg/dL 146(H) 155(H) 122(H)  BUN 6 - 20 mg/dL 12 12 14   Creatinine 0.61 - 1.24 mg/dL 0.65 0.71 0.59(L)  Sodium 135 - 145 mmol/L 138 138 137  Potassium 3.5 - 5.1 mmol/L 3.9 4.0 4.1  Chloride 101 - 111 mmol/L 106 107 103  CO2 22 - 32 mmol/L 22 26 25   Calcium 8.9 - 10.3 mg/dL 9.4 9.1 8.9  Total Protein 6.5 - 8.1 g/dL 6.3(L) 5.5(L) 5.8(L)  Total Bilirubin 0.3 - 1.2 mg/dL 1.8(H) 0.9 1.8(H)  Alkaline Phos 38 - 126 U/L 109 - 136(H)  AST 15 - 41 U/L 59(H) 48(H) 57(H)  ALT 17 - 63 U/L 50 41 40   CBC    Component Value Date/Time   WBC 4.1 04/18/2018 0753   RBC 4.56 04/18/2018 0753   HGB 12.7 (L) 04/18/2018 0753   HCT 39.3 04/18/2018 0753   PLT 74 (L) 04/18/2018 0753   MCV 86.2 04/18/2018 0753   MCH 27.9 04/18/2018 0753   MCHC 32.3 04/18/2018 0753   RDW 15.0 04/18/2018 0753   LYMPHSABS 1.4 04/18/2018 0753   MONOABS 0.3  04/18/2018 0753   EOSABS 0.1 04/18/2018 0753   BASOSABS 0.0 04/18/2018 0753       02/20/2018 CT abdomen w contrast: abdominal swelling for 2 months 1. Cirrhosis of the liver. 2. Stigmata of portal venous hypertension including splenomegaly, esophageal varices and ascites.   02/19/2018 US abdomen: ascites: Large amt of ascites present.  CBD 12m. Portal vein is patient on color Doppler imaging with normal direction of blood flow towards the liver.  Cirrhosis. No suspicious hepatic masses, Splenomegaly.   Review of Systems Past Medical History:  Diagnosis Date  . [place]   . Diabetes ([facility]     Past Surgical History:  Procedure Laterality Date  . IR PARACENTESIS  04/18/2018  . IR TRANSCATHETER BX  04/18/2018  . IR [place] VASC ACCESS RIGHT  04/18/2018  . IR VENOGRAM HEPATIC W HEMODYNAMIC EVALUATION  04/18/2018    No Known Allergies  Current Outpatient Medications on File Prior to Visit  Medication Sig Dispense Refill  . furosemide (LASIX) 40 MG tablet Take 40 mg by mouth daily.     . metFORMIN (GLUCOPHAGE) 1000 MG tablet Take 1,000 mg by mouth  2 (two) times daily with a meal.    . spironolactone (ALDACTONE) 100 MG tablet TAKE ONE TABLET (100MG TOTAL) BY MOUTH DAILY 30 tablet 3  . triamcinolone cream (KENALOG) 0.5 % Apply 1 application topically as needed.   1  . potassium chloride (K-DUR) 10 MEQ tablet Take 20 mEq by mouth daily.     No current facility-administered medications on file prior to visit.         Objective:   Physical Exam Blood pressure 132/78, pulse 64, resp. rate 18, height 6' 1"  (1.854 m), weight 229 lb 14.4 oz (104.3 kg). Alert and oriented. Skin warm and dry. Oral mucosa is moist.   . Sclera anicteric, conjunctivae is pink. Thyroid not enlarged. No cervical lymphadenopathy. Lungs clear. Heart regular rate and rhythm.  . Bowel sounds are positive. No hepatomegaly. No abdominal masses felt. No tenderness.  2+ edema to lower extremities.  Umbilical hernia  noted. Abdomen is distended but not tense. Abdomen is soft.         Assessment & Plan:  Cirrhosis with ascites. He is doing well.  Has some swelling to his ankles. Am going to get a CMET and CBC today.  Needs Hepatitis A and B vaccine. Referral to Dr. Monica Martinez for evaluation of liver transplant.  I discussed this case with Dr. Laural Golden. Will also need an EGD with propofol possible banding

## 2018-08-06 NOTE — Telephone Encounter (Signed)
Needs referral [facility] Dr. Monica Martinez

## 2018-08-06 NOTE — Patient Instructions (Addendum)
Labs today  Patient needs Hepatitis A and B vaccine. Can be given at Dr. Karie Kirks office hopefully.  Rx sent to [place].

## 2018-08-07 ENCOUNTER — Encounter (INDEPENDENT_AMBULATORY_CARE_PROVIDER_SITE_OTHER): Payer: Self-pay | Admitting: *Deleted

## 2018-08-07 ENCOUNTER — Other Ambulatory Visit (INDEPENDENT_AMBULATORY_CARE_PROVIDER_SITE_OTHER): Payer: Self-pay | Admitting: Internal Medicine

## 2018-08-07 DIAGNOSIS — K7460 Unspecified cirrhosis of liver: Secondary | ICD-10-CM | POA: Insufficient documentation

## 2018-08-07 DIAGNOSIS — K7469 Other cirrhosis of liver: Secondary | ICD-10-CM | POA: Insufficient documentation

## 2018-08-07 LAB — CBC WITH DIFFERENTIAL/PLATELET
Basophils Absolute: 29 cells/uL (ref 0–200)
Basophils Relative: 0.6 %
EOS PCT: 1.7 %
Eosinophils Absolute: 82 cells/uL (ref 15–500)
HEMATOCRIT: 38.7 % (ref 38.5–50.0)
Hemoglobin: 12.5 g/dL — ABNORMAL LOW (ref 13.2–17.1)
Lymphs Abs: 1589 cells/uL (ref 850–3900)
MCH: 28.2 pg (ref 27.0–33.0)
MCHC: 32.3 g/dL (ref 32.0–36.0)
MCV: 87.2 fL (ref 80.0–100.0)
MPV: 12.2 fL (ref 7.5–12.5)
Monocytes Relative: 5.8 %
NEUTROS PCT: 58.8 %
Neutro Abs: 2822 cells/uL (ref 1500–7800)
Platelets: 74 10*3/uL — ABNORMAL LOW (ref 140–400)
RBC: 4.44 10*6/uL (ref 4.20–5.80)
RDW: 13.6 % (ref 11.0–15.0)
TOTAL LYMPHOCYTE: 33.1 %
WBC: 4.8 10*3/uL (ref 3.8–10.8)
WBCMIX: 278 {cells}/uL (ref 200–950)

## 2018-08-07 LAB — COMPLETE METABOLIC PANEL WITH GFR
AG Ratio: 1.7 (calc) (ref 1.0–2.5)
ALBUMIN MSPROF: 3.8 g/dL (ref 3.6–5.1)
ALT: 51 U/L — ABNORMAL HIGH (ref 9–46)
AST: 49 U/L — ABNORMAL HIGH (ref 10–35)
Alkaline phosphatase (APISO): 144 U/L — ABNORMAL HIGH (ref 40–115)
BILIRUBIN TOTAL: 1 mg/dL (ref 0.2–1.2)
BUN: 14 mg/dL (ref 7–25)
CALCIUM: 9.2 mg/dL (ref 8.6–10.3)
CHLORIDE: 103 mmol/L (ref 98–110)
CO2: 27 mmol/L (ref 20–32)
CREATININE: 0.92 mg/dL (ref 0.70–1.33)
GFR, EST AFRICAN AMERICAN: 107 mL/min/{1.73_m2} (ref >=60)
GFR, EST NON AFRICAN AMERICAN: 93 mL/min/{1.73_m2} (ref >=60)
GLUCOSE: 366 mg/dL — AB (ref 65–139)
Globulin: 2.3 g/dL (calc) (ref 1.9–3.7)
Potassium: 3.9 mmol/L (ref 3.5–5.3)
Sodium: 136 mmol/L (ref 135–146)
TOTAL PROTEIN: 6.1 g/dL (ref 6.1–8.1)

## 2018-08-07 NOTE — Telephone Encounter (Signed)
Referral &  Notes faxed, they will contact patient to schedule

## 2018-08-13 ENCOUNTER — Ambulatory Visit (HOSPITAL_COMMUNITY)
Admission: RE | Admit: 2018-08-13 | Discharge: 2018-08-13 | Disposition: A | Discharge status: Home / Self Care | Payer: BLUE CROSS/BLUE SHIELD | Source: Ambulatory Visit | Attending: Internal Medicine | Admitting: Internal Medicine

## 2018-08-13 DIAGNOSIS — R188 Other ascites: Secondary | ICD-10-CM | POA: Insufficient documentation

## 2018-08-13 DIAGNOSIS — K7460 Unspecified cirrhosis of liver: Secondary | ICD-10-CM | POA: Insufficient documentation

## 2018-08-25 ENCOUNTER — Encounter (HOSPITAL_COMMUNITY): Payer: Self-pay

## 2018-08-25 ENCOUNTER — Other Ambulatory Visit: Payer: Self-pay

## 2018-08-25 ENCOUNTER — Encounter (HOSPITAL_COMMUNITY)
Admission: RE | Admit: 2018-08-25 | Discharge: 2018-08-25 | Disposition: A | Discharge status: Home / Self Care | Payer: BLUE CROSS/BLUE SHIELD | Source: Ambulatory Visit | Attending: Internal Medicine | Admitting: Internal Medicine

## 2018-08-25 DIAGNOSIS — Z01810 Encounter for preprocedural cardiovascular examination: Secondary | ICD-10-CM | POA: Diagnosis present

## 2018-08-25 HISTORY — DX: Unspecified osteoarthritis, unspecified site: M19.90

## 2018-08-25 HISTORY — DX: Major depressive disorder, single episode, unspecified: F32.9

## 2018-08-25 HISTORY — DX: Depression, unspecified: F32.A

## 2018-08-25 NOTE — Patient Instructions (Signed)
5    Your procedure is scheduled on: 08/29/2018  Report to [facility] at     8:00  AM.  Call this number if you have problems the morning of surgery: [phone]   Remember:   Do not drink or eat food:After Midnight.    Clear liquids include soda, tea, black coffee, apple or grape juice, broth.  Take these medicines the morning of surgery with A SIP OF WATER: none   Do not wear jewelry, make-up or nail polish.  Do not wear lotions, powders, or perfumes. You may wear deodorant.  Do not shave 48 hours prior to surgery. Men may shave face and neck.  Do not bring valuables to the hospital.  Contacts, dentures or bridgework may not be worn into surgery.  Leave suitcase in the car. After surgery it may be brought to your room.  For patients admitted to the hospital, checkout time is 11:00 AM the day of discharge.   Patients discharged the day of surgery will not be allowed to drive home.  Name and phone number of your driver:    Please read over the following fact sheets that you were given: Pain Booklet, Lab Information and Anesthesia Post-op Instructions   Endoscopy Care After Please read the instructions outlined below and refer to this sheet in the next few weeks. These discharge instructions provide you with general information on caring for yourself after you leave the hospital. Your doctor may also give you specific instructions. While your treatment has been planned according to the most current medical practices available, unavoidable complications occasionally occur. If you have any problems or questions after discharge, please call your doctor. HOME CARE INSTRUCTIONS Activity  You may resume your regular activity but move at a slower pace for the next 24 hours.   Take frequent rest periods for the next 24 hours.   Walking will help expel (get rid of) the air and reduce the bloated feeling in your abdomen.   No driving for 24 hours (because of the anesthesia (medicine) used  during the test).   You may shower.   Do not sign any important legal documents or operate any machinery for 24 hours (because of the anesthesia used during the test).  Nutrition  Drink plenty of fluids.   You may resume your normal diet.   Begin with a light meal and progress to your normal diet.   Avoid alcoholic beverages for 24 hours or as instructed by your caregiver.  Medications You may resume your normal medications unless your caregiver tells you otherwise. What you can expect today  You may experience abdominal discomfort such as a feeling of fullness or "gas" pains.   You may experience a sore throat for 2 to 3 days. This is normal. Gargling with salt water may help this.  Follow-up Your doctor will discuss the results of your test with you. SEEK IMMEDIATE MEDICAL CARE IF:  You have excessive nausea (feeling sick to your stomach) and/or vomiting.   You have severe abdominal pain and distention (swelling).   You have trouble swallowing.   You have a temperature over 100 F (37.8 C).   You have rectal bleeding or vomiting of blood.  Document Released: 07/17/2004 Document Revised: 11/22/2011 Document Reviewed: 01/28/2008

## 2018-08-29 ENCOUNTER — Ambulatory Visit (HOSPITAL_COMMUNITY): Payer: BLUE CROSS/BLUE SHIELD | Admitting: Anesthesiology

## 2018-08-29 ENCOUNTER — Encounter (HOSPITAL_COMMUNITY)
Admission: RE | Disposition: A | Discharge status: Home / Self Care | Payer: Self-pay | Source: Ambulatory Visit | Attending: Internal Medicine

## 2018-08-29 ENCOUNTER — Ambulatory Visit (HOSPITAL_COMMUNITY)
Admission: RE | Admit: 2018-08-29 | Discharge: 2018-08-29 | Disposition: A | Discharge status: Home / Self Care | Payer: BLUE CROSS/BLUE SHIELD | Source: Ambulatory Visit | Attending: Internal Medicine | Admitting: Internal Medicine

## 2018-08-29 DIAGNOSIS — K766 Portal hypertension: Secondary | ICD-10-CM

## 2018-08-29 DIAGNOSIS — K7469 Other cirrhosis of liver: Secondary | ICD-10-CM | POA: Insufficient documentation

## 2018-08-29 DIAGNOSIS — K3189 Other diseases of stomach and duodenum: Secondary | ICD-10-CM | POA: Insufficient documentation

## 2018-08-29 DIAGNOSIS — Z79899 Other long term (current) drug therapy: Secondary | ICD-10-CM | POA: Diagnosis not present

## 2018-08-29 DIAGNOSIS — M1990 Unspecified osteoarthritis, unspecified site: Secondary | ICD-10-CM | POA: Diagnosis not present

## 2018-08-29 DIAGNOSIS — Z7984 Long term (current) use of oral hypoglycemic drugs: Secondary | ICD-10-CM | POA: Insufficient documentation

## 2018-08-29 DIAGNOSIS — K7460 Unspecified cirrhosis of liver: Secondary | ICD-10-CM

## 2018-08-29 DIAGNOSIS — K449 Diaphragmatic hernia without obstruction or gangrene: Secondary | ICD-10-CM

## 2018-08-29 DIAGNOSIS — F329 Major depressive disorder, single episode, unspecified: Secondary | ICD-10-CM | POA: Insufficient documentation

## 2018-08-29 DIAGNOSIS — R188 Other ascites: Secondary | ICD-10-CM | POA: Diagnosis not present

## 2018-08-29 DIAGNOSIS — I8510 Secondary esophageal varices without bleeding: Secondary | ICD-10-CM | POA: Insufficient documentation

## 2018-08-29 DIAGNOSIS — E119 Type 2 diabetes mellitus without complications: Secondary | ICD-10-CM | POA: Insufficient documentation

## 2018-08-29 DIAGNOSIS — K253 Acute gastric ulcer without hemorrhage or perforation: Secondary | ICD-10-CM

## 2018-08-29 DIAGNOSIS — K259 Gastric ulcer, unspecified as acute or chronic, without hemorrhage or perforation: Secondary | ICD-10-CM | POA: Insufficient documentation

## 2018-08-29 DIAGNOSIS — E0810 Diabetes mellitus due to underlying condition with ketoacidosis without coma: Secondary | ICD-10-CM

## 2018-08-29 HISTORY — PX: ESOPHAGEAL BANDING: SHX5518

## 2018-08-29 HISTORY — PX: ESOPHAGOGASTRODUODENOSCOPY (EGD) WITH PROPOFOL: SHX5813

## 2018-08-29 LAB — GLUCOSE, CAPILLARY
GLUCOSE-CAPILLARY: 206 mg/dL — AB (ref 70–99)
GLUCOSE-CAPILLARY: 183 mg/dL — AB (ref 70–99)

## 2018-08-29 LAB — HEMOGLOBIN A1C
Hgb A1c MFr Bld: 10.1 % — ABNORMAL HIGH (ref 4.8–5.6)
MEAN PLASMA GLUCOSE: 243.17 mg/dL

## 2018-08-29 SURGERY — ESOPHAGOGASTRODUODENOSCOPY (EGD) WITH PROPOFOL
Anesthesia: General

## 2018-08-29 MED ORDER — CHLORHEXIDINE GLUCONATE CLOTH 2 % EX PADS: 6.0000 | MEDICATED_PAD | Freq: Once | CUTANEOUS | Status: DC

## 2018-08-29 MED ORDER — MIDAZOLAM HCL 2 MG/2ML IJ SOLN: 0.5000 mg | Freq: Once | INTRAMUSCULAR | Status: DC | PRN

## 2018-08-29 MED ORDER — LACTATED RINGERS IV SOLN
INTRAVENOUS | Status: DC
  Administered 2018-08-29: 09:00:00 via INTRAVENOUS

## 2018-08-29 MED ORDER — MIDAZOLAM HCL 5 MG/5ML IJ SOLN
INTRAMUSCULAR | Status: DC | PRN
  Administered 2018-08-29: 2 mg via INTRAVENOUS

## 2018-08-29 MED ORDER — MIDAZOLAM HCL 2 MG/2ML IJ SOLN
INTRAMUSCULAR | Status: AC
  Filled 2018-08-29: qty 2

## 2018-08-29 MED ORDER — GLYCOPYRROLATE 0.2 MG/ML IJ SOLN
INTRAMUSCULAR | Status: AC
  Filled 2018-08-29: qty 1

## 2018-08-29 MED ORDER — HYDROCODONE-ACETAMINOPHEN 7.5-325 MG PO TABS: 1.0000 | ORAL_TABLET | Freq: Once | ORAL | Status: DC | PRN

## 2018-08-29 MED ORDER — PROPOFOL 500 MG/50ML IV EMUL
INTRAVENOUS | Status: DC | PRN
  Administered 2018-08-29: 125 ug/kg/min via INTRAVENOUS

## 2018-08-29 MED ORDER — PROMETHAZINE HCL 25 MG/ML IJ SOLN: 6.2500 mg | INTRAMUSCULAR | Status: DC | PRN

## 2018-08-29 MED ORDER — NADOLOL 20 MG PO TABS: 20.0000 mg | ORAL_TABLET | Freq: Every day | ORAL | 5 refills | Status: DC

## 2018-08-29 MED ORDER — PANTOPRAZOLE SODIUM 40 MG PO TBEC: 40.0000 mg | DELAYED_RELEASE_TABLET | Freq: Two times a day (BID) | ORAL | 2 refills | Status: DC

## 2018-08-29 MED ORDER — HYDROMORPHONE HCL 1 MG/ML IJ SOLN: 0.2500 mg | INTRAMUSCULAR | Status: DC | PRN

## 2018-08-29 MED ORDER — PROPOFOL 10 MG/ML IV BOLUS
INTRAVENOUS | Status: AC
  Filled 2018-08-29: qty 20

## 2018-08-29 MED ORDER — LACTATED RINGERS IV SOLN
INTRAVENOUS | Status: DC | PRN
  Administered 2018-08-29: 08:00:00 via INTRAVENOUS

## 2018-08-29 MED ORDER — GLYCOPYRROLATE 0.2 MG/ML IJ SOLN
INTRAMUSCULAR | Status: DC | PRN
  Administered 2018-08-29: 0.2 mg via INTRAVENOUS

## 2018-08-29 NOTE — Discharge Instructions (Addendum)
No aspirin or NSAIDs. Resume usual medications as before. Pantoprazole 40 mg by mouth 30 minutes before breakfast and evening meal daily. Nadolol 20 mg by mouth daily. Full liquids today and usual diet starting tomorrow morning. No driving for 24 hours. Physician will call with results of blood test. Repeat EGD in 8 weeks.                                                                                                                                      Esophagogastroduodenoscopy, Care After Refer to this sheet in the next few weeks. These instructions provide you with information about caring for yourself after your procedure. Your health care provider may also give you more specific instructions. Your treatment has been planned according to current medical practices, but problems sometimes occur. Call your health care provider if you have any problems or questions after your procedure. What can I expect after the procedure? After the procedure, it is common to have:  A sore throat.  Nausea.  Bloating.  Dizziness.  Fatigue.  Follow these instructions at home:  Do not eat or drink anything until the numbing medicine (local anesthetic) has worn off and your gag reflex has returned. You will know that the local anesthetic has worn off when you can swallow comfortably.  Do not drive for 24 hours if you received a medicine to help you relax (sedative).  If your health care provider took a tissue sample for testing during the procedure, make sure to get your test results. This is your responsibility. Ask your health care provider or the department performing the test when your results will be ready.  Keep all follow-up visits as told by your health care provider. This is important. Contact a health care provider if:  You cannot stop coughing.  You are not urinating.  You are urinating less than usual. Get help right away if:  You have trouble swallowing.  You cannot eat or  drink.  You have throat or chest pain that gets worse.  You are dizzy or light-headed.  You faint.  You have nausea or vomiting.  You have chills.  You have a fever.  You have severe abdominal pain.  You have black, tarry, or bloody stools. This information is not intended to replace advice given to you by your health care provider. Make sure you discuss any questions you have with your health care provider. Document Released: 11/19/2012 Document Revised: 05/10/2016 Document Reviewed: 10/27/2015 Elsevier Interactive Patient Education  [employer].

## 2018-08-29 NOTE — Transfer of Care (Signed)
Immediate Anesthesia Transfer of Care Note  Patient: Lawrence Rogers  Procedure(s) Performed: ESOPHAGOGASTRODUODENOSCOPY (EGD) WITH PROPOFOL (N/A ) ESOPHAGEAL BANDING (N/A )  Patient Location: PACU  Anesthesia Type:MAC  Level of Consciousness: awake  Airway & Oxygen Therapy: Patient Spontanous Breathing and Patient connected to nasal cannula oxygen  Post-op Assessment: Report given to RN, Post -op Vital signs reviewed and stable and Patient moving all extremities  Post vital signs: Reviewed and stable  Last Vitals:  Vitals Value Taken Time  BP    Temp    Pulse 96 08/29/2018  9:46 AM  Resp    SpO2 96 % 08/29/2018  9:46 AM  Vitals shown include unvalidated device data.  Last Pain:  Vitals:   08/29/18 0915  PainSc: 0-No pain         Complications: No apparent anesthesia complications

## 2018-08-29 NOTE — Anesthesia Preprocedure Evaluation (Signed)
Anesthesia Evaluation  Patient identified by MRN, date of birth, ID band Patient awake    Reviewed: Allergy & Precautions, NPO status , Patient's Chart, lab work & pertinent test results  Airway Mallampati: II  TM Distance: >3 FB Neck ROM: Full    Dental no notable dental hx. (+) Edentulous Upper, Missing   Pulmonary neg pulmonary ROS,  Step daughter -states he has undiagnosed OSA- never formally tested   Pulmonary exam normal breath sounds clear to auscultation       Cardiovascular Exercise Tolerance: Good negative cardio ROS Normal cardiovascular examI Rhythm:Regular Rate:Normal     Neuro/Psych Depression negative neurological ROS  negative psych ROS   GI/Hepatic negative GI ROS, Neg liver ROS, (+) Cirrhosis       , Getting w/u for Liver transplant in October  States last paracentesis was in ~04/2018   Endo/Other  negative endocrine ROSdiabetes, Type 2  Renal/GU negative Renal ROS  negative genitourinary   Musculoskeletal negative musculoskeletal ROS (+)   Abdominal   Peds negative pediatric ROS (+)  Hematology negative hematology ROS (+)   Anesthesia Other Findings   Reproductive/Obstetrics negative OB ROS                             Anesthesia Physical Anesthesia Plan  ASA: III  Anesthesia Plan: General   Post-op Pain Management:    Induction: Intravenous  PONV Risk Score and Plan:   Airway Management Planned: Nasal Cannula and Simple Face Mask  Additional Equipment:   Intra-op Plan:   Post-operative Plan:   Informed Consent: I have reviewed the patients History and Physical, chart, labs and discussed the procedure including the risks, benefits and alternatives for the proposed anesthesia with the patient or authorized representative who has indicated his/her understanding and acceptance.   Dental advisory given  Plan Discussed with: CRNA  Anesthesia Plan  Comments:         Anesthesia Quick Evaluation

## 2018-08-29 NOTE — H&P (Signed)
Lawrence Rogers is an 57 y.o. male.   Chief Complaint: Patient is here for EGD and possible esophageal variceal banding. HPI: Patient is a 57 year old Caucasian male who was diagnosed with cirrhosis earlier this year when he presented with new onset of ascites.  No history of elevated transaminases or jaundice in the past.  Patient does not drink alcohol.  Biochemical markers are unremarkable.  Liver biopsy he had advanced cirrhosis.  Etiology felt to be Karlene Lineman given history of diabetes mellitus. Patient is here for EGD to assess and treat her esophageal varices.  He denies hematemesis melena or rectal bleeding.  He also denies abdominal pain. He does not take aspirin or other OTC NSAIDs on a regular basis. Patient has an appointment to be seen at transplant center at [facility] next month.  Past Medical History:  Diagnosis Date  . Arthritis   . Cirrhosis ([place])   . Depression   . Diabetes [facility])     Past Surgical History:  Procedure Laterality Date  . IR PARACENTESIS  04/18/2018  . IR TRANSCATHETER BX  04/18/2018  . IR US GUIDE VASC ACCESS RIGHT  04/18/2018  . IR VENOGRAM HEPATIC W HEMODYNAMIC EVALUATION  04/18/2018    No family history on file. Social History:  reports that he has never smoked. He has never used smokeless tobacco. He reports that he does not drink alcohol or use drugs.  Allergies: No Known Allergies  Medications Prior to Admission  Medication Sig Dispense Refill  . furosemide (LASIX) 40 MG tablet Take 40 mg by mouth daily.     . metFORMIN (GLUCOPHAGE-XR) 500 MG 24 hr tablet Take 1,000 mg by mouth 2 (two) times daily.  2  . spironolactone (ALDACTONE) 100 MG tablet TAKE ONE TABLET (100MG TOTAL) BY MOUTH DAILY (Patient taking differently: Take 100 mg by mouth daily. ) 30 tablet 3  . triamcinolone cream (KENALOG) 0.5 % Apply 1 application topically daily as needed (irritation).   1    Results for orders placed or performed during the hospital encounter of 08/29/18 (from the past 48  hour(s))  Glucose, capillary     Status: Abnormal   Collection Time: 08/29/18  8:01 AM  Result Value Ref Range   Glucose-Capillary 206 (H) 70 - 99 mg/dL   No results found.  ROS  There were no vitals taken for this visit. Physical Exam  Constitutional: He appears well-developed and well-nourished.  HENT:  Mouth/Throat: Oropharynx is clear and moist.  Eyes: Conjunctivae are normal.  Neck: No thyromegaly present.  Cardiovascular: Normal rate, regular rhythm and normal heart sounds.  No murmur heard. Respiratory: Effort normal and breath sounds normal.  GI:  Abdomen is full with bulging flanks.  It is soft and nontender.  Spleen is easily palpable.  Liver edge is indistinct.   Musculoskeletal: He exhibits edema.  Trace to 1+ edema involving both ankles right greater than left.  Lymphadenopathy:    He has no cervical adenopathy.  Neurological: He is alert.  No asterixis.  Skin: Skin is warm and dry.     Assessment/Plan Cirrhosis. EGD to assess and band esophageal varices.  Hildred Laser, MD 08/29/2018, 9:08 AM

## 2018-08-29 NOTE — Op Note (Signed)
[facility] Patient Name: Lawrence Rogers Procedure Date: 08/29/2018 8:49 AM MRN: [id number] Date of Birth: [birth year] Attending MD: Hildred Laser , MD CSN: [id number] Age: 57 Admit Type: Outpatient Procedure:                Upper GI endoscopy Indications:              Cirrhosis with suspected esophageal varices, For                            therapy of esophageal varices Providers:                Hildred Laser, MD, Otis Peak B. Sharon Seller, RN, Nelma Rothman, Technician Referring MD:             [name], MD Medicines:                Propofol per Anesthesia Complications:            No immediate complications. Estimated Blood Loss:     Estimated blood loss: none. Procedure:                Pre-Anesthesia Assessment:                           - Prior to the procedure, a History and Physical                            was performed, and patient medications and                            allergies were reviewed. The patient's tolerance of                            previous anesthesia was also reviewed. The risks                            and benefits of the procedure and the sedation                            options and risks were discussed with the patient.                            All questions were answered, and informed consent                            was obtained. Prior Anticoagulants: The patient has                            taken no previous anticoagulant or antiplatelet                            agents. ASA Grade Assessment: III - A patient with  severe systemic disease. After reviewing the risks                            and benefits, the patient was deemed in                            satisfactory condition to undergo the procedure.                           After obtaining informed consent, the endoscope was                            passed under direct vision. Throughout the   procedure, the patient's blood pressure, pulse, and                            oxygen saturations were monitored continuously. The                            [id number]-H190 ([id number]) scope was introduced through the                            and advanced to the second part of duodenum. The                            upper GI endoscopy was accomplished without                            difficulty. Scope In: 9:20:56 AM Scope Out: 9:36:42 AM Total Procedure Duration: 0 hours 15 minutes 46 seconds  Findings:      The proximal esophagus was normal.      Grade II, grade III varices were found in the distal esophagus. Four       bands were successfully placed with incomplete eradication of varices.       There was no bleeding during, and at the end, of the procedure.      The Z-line was regular and was found 43 cm from the incisors.      A 2 cm hiatal hernia was present.      Moderate portal hypertensive gastropathy was found in the entire       examined stomach.      One non-bleeding gastric ulcer with no stigmata of bleeding was found in       the prepyloric region of the stomach. The lesion was 4 mm in largest       dimension.      The exam of the stomach was otherwise normal.      The duodenal bulb was normal.      Congested mucosa without active bleeding and with no stigmata of       bleeding was found in the second portion of the duodenum. Impression:               - Normal proximal esophagus.                           - Grade II and grade III esophageal varices.  Incompletely eradicated. Banded.                           - Z-line regular, 43 cm from the incisors.                           - 2 cm hiatal hernia.                           - Portal hypertensive gastropathy.                           - Non-bleeding gastric ulcer with no stigmata of                            bleeding.                           - Normal duodenal bulb.                           -  Congested duodenal mucosa.                           - No specimens collected. Moderate Sedation:      Per Anesthesia Care Recommendation:           - Patient has a contact number available for                            emergencies. The signs and symptoms of potential                            delayed complications were discussed with the                            patient. Return to normal activities tomorrow.                            Written discharge instructions were provided to the                            patient.                           - Full liquid diet today.                           - Continue present medications.                           - Pantoprazole 40 mg po bid.                           - Nadolol 20 mg po qd.                           - H.Pylori serology.                           -  Repeat upper endoscopy in 8 weeks. Procedure Code(s):        --- Professional ---                           [phone], Esophagogastroduodenoscopy, flexible,                            transoral; with band ligation of esophageal/gastric                            varices Diagnosis Code(s):        --- Professional ---                           K74.60, Unspecified cirrhosis of liver                           I85.10, Secondary esophageal varices without                            bleeding                           K44.9, Diaphragmatic hernia without obstruction or                            gangrene                           K76.6, Portal hypertension                           K31.89, Other diseases of stomach and duodenum                           K25.9, Gastric ulcer, unspecified as acute or                            chronic, without hemorrhage or perforation CPT copyright 2017 American [employer] Association. All rights reserved. The codes documented in this report are preliminary and upon coder review may  be revised to meet current compliance requirements. Hildred Laser, MD Hildred Laser, MD 08/29/2018 9:49:52 AM This report has been signed electronically. Number of Addenda: 0

## 2018-08-29 NOTE — Anesthesia Postprocedure Evaluation (Signed)
Anesthesia Post Note  Patient: COMMODORE BELLEW  Procedure(s) Performed: ESOPHAGOGASTRODUODENOSCOPY (EGD) WITH PROPOFOL (N/A ) ESOPHAGEAL BANDING (N/A )  Patient location during evaluation: PACU Anesthesia Type: MAC Level of consciousness: awake and patient cooperative Pain management: pain level controlled Vital Signs Assessment: post-procedure vital signs reviewed and stable Respiratory status: spontaneous breathing, nonlabored ventilation and respiratory function stable Cardiovascular status: blood pressure returned to baseline Postop Assessment: no apparent nausea or vomiting Anesthetic complications: no     Last Vitals: There were no vitals filed for this visit.  Last Pain:  Vitals:   08/29/18 0915  PainSc: 0-No pain                 Mostafa Yuan J

## 2018-09-01 LAB — H. PYLORI ANTIBODY, IGG: H PYLORI IGG: 0.34 {index_val} (ref 0.00–0.79)

## 2018-09-04 ENCOUNTER — Encounter (HOSPITAL_COMMUNITY): Payer: Self-pay | Admitting: Internal Medicine

## 2018-09-18 ENCOUNTER — Other Ambulatory Visit (INDEPENDENT_AMBULATORY_CARE_PROVIDER_SITE_OTHER): Payer: Self-pay | Admitting: *Deleted

## 2018-09-18 DIAGNOSIS — K259 Gastric ulcer, unspecified as acute or chronic, without hemorrhage or perforation: Secondary | ICD-10-CM

## 2018-09-22 ENCOUNTER — Encounter (INDEPENDENT_AMBULATORY_CARE_PROVIDER_SITE_OTHER): Payer: Self-pay | Admitting: *Deleted

## 2018-09-22 DIAGNOSIS — K259 Gastric ulcer, unspecified as acute or chronic, without hemorrhage or perforation: Secondary | ICD-10-CM | POA: Insufficient documentation

## 2018-10-07 ENCOUNTER — Emergency Department (HOSPITAL_COMMUNITY)
Admission: EM | Admit: 2018-10-07 | Discharge: 2018-10-07 | Disposition: A | Discharge status: Home / Self Care | Payer: BLUE CROSS/BLUE SHIELD | Attending: Emergency Medicine | Admitting: Emergency Medicine

## 2018-10-07 ENCOUNTER — Emergency Department (HOSPITAL_COMMUNITY): Payer: BLUE CROSS/BLUE SHIELD

## 2018-10-07 ENCOUNTER — Encounter (HOSPITAL_COMMUNITY): Payer: Self-pay | Admitting: Emergency Medicine

## 2018-10-07 ENCOUNTER — Other Ambulatory Visit: Payer: Self-pay

## 2018-10-07 DIAGNOSIS — Z7984 Long term (current) use of oral hypoglycemic drugs: Secondary | ICD-10-CM | POA: Diagnosis not present

## 2018-10-07 DIAGNOSIS — Y999 Unspecified external cause status: Secondary | ICD-10-CM | POA: Insufficient documentation

## 2018-10-07 DIAGNOSIS — S7002XA Contusion of left hip, initial encounter: Secondary | ICD-10-CM | POA: Diagnosis not present

## 2018-10-07 DIAGNOSIS — Y9389 Activity, other specified: Secondary | ICD-10-CM | POA: Insufficient documentation

## 2018-10-07 DIAGNOSIS — Y92410 Unspecified street and highway as the place of occurrence of the external cause: Secondary | ICD-10-CM | POA: Diagnosis not present

## 2018-10-07 DIAGNOSIS — E119 Type 2 diabetes mellitus without complications: Secondary | ICD-10-CM | POA: Diagnosis not present

## 2018-10-07 DIAGNOSIS — S7012XA Contusion of left thigh, initial encounter: Secondary | ICD-10-CM | POA: Diagnosis not present

## 2018-10-07 DIAGNOSIS — Z87891 Personal history of nicotine dependence: Secondary | ICD-10-CM | POA: Diagnosis not present

## 2018-10-07 DIAGNOSIS — Z79899 Other long term (current) drug therapy: Secondary | ICD-10-CM | POA: Diagnosis not present

## 2018-10-07 DIAGNOSIS — S79912A Unspecified injury of left hip, initial encounter: Secondary | ICD-10-CM | POA: Diagnosis present

## 2018-10-07 LAB — CBC WITH DIFFERENTIAL/PLATELET
Abs Immature Granulocytes: 0.03 10*3/uL (ref 0.00–0.07)
BASOS ABS: 0 10*3/uL (ref 0.0–0.1)
BASOS PCT: 0 %
EOS PCT: 1 %
Eosinophils Absolute: 0.1 10*3/uL (ref 0.0–0.5)
HCT: 41.7 % (ref 39.0–52.0)
Hemoglobin: 13.3 g/dL (ref 13.0–17.0)
Immature Granulocytes: 1 %
LYMPHS PCT: 25 %
Lymphs Abs: 1.5 10*3/uL (ref 0.7–4.0)
MCH: 28.1 pg (ref 26.0–34.0)
MCHC: 31.9 g/dL (ref 30.0–36.0)
MCV: 88.2 fL (ref 80.0–100.0)
Monocytes Absolute: 0.4 10*3/uL (ref 0.1–1.0)
Monocytes Relative: 6 %
NRBC: 0 % (ref 0.0–0.2)
Neutro Abs: 4.1 10*3/uL (ref 1.7–7.7)
Neutrophils Relative %: 67 %
PLATELETS: DECREASED 10*3/uL (ref 150–400)
RBC: 4.73 MIL/uL (ref 4.22–5.81)
RDW: 14.9 % (ref 11.5–15.5)
WBC: 6.2 10*3/uL (ref 4.0–10.5)

## 2018-10-07 LAB — CBC
HCT: 42.7 % (ref 39.0–52.0)
HEMOGLOBIN: 13.6 g/dL (ref 13.0–17.0)
MCH: 28 pg (ref 26.0–34.0)
MCHC: 31.9 g/dL (ref 30.0–36.0)
MCV: 87.9 fL (ref 80.0–100.0)
Platelets: 83 10*3/uL — ABNORMAL LOW (ref 150–400)
RBC: 4.86 MIL/uL (ref 4.22–5.81)
RDW: 15.1 % (ref 11.5–15.5)
WBC: 9.1 10*3/uL (ref 4.0–10.5)
nRBC: 0 % (ref 0.0–0.2)

## 2018-10-07 LAB — COMPREHENSIVE METABOLIC PANEL
ALBUMIN: 3.9 g/dL (ref 3.5–5.0)
ALT: 82 U/L — ABNORMAL HIGH (ref 0–44)
ANION GAP: 7 (ref 5–15)
AST: 74 U/L — ABNORMAL HIGH (ref 15–41)
Alkaline Phosphatase: 128 U/L — ABNORMAL HIGH (ref 38–126)
BUN: 14 mg/dL (ref 6–20)
CO2: 25 mmol/L (ref 22–32)
Calcium: 9.3 mg/dL (ref 8.9–10.3)
Chloride: 103 mmol/L (ref 98–111)
Creatinine, Ser: 0.91 mg/dL (ref 0.61–1.24)
GFR calc Af Amer: >60 mL/min (ref >60)
GFR calc non Af Amer: >60 mL/min (ref >60)
GLUCOSE: 289 mg/dL — AB (ref 70–99)
POTASSIUM: 4.2 mmol/L (ref 3.5–5.1)
Sodium: 135 mmol/L (ref 135–145)
Total Bilirubin: 1.7 mg/dL — ABNORMAL HIGH (ref 0.3–1.2)
Total Protein: 6.8 g/dL (ref 6.5–8.1)

## 2018-10-07 LAB — APTT: aPTT: 30 seconds (ref 24–36)

## 2018-10-07 LAB — TYPE AND SCREEN
ABO/RH(D): O POS
ANTIBODY SCREEN: NEGATIVE

## 2018-10-07 LAB — PROTIME-INR
INR: 1.13
PROTHROMBIN TIME: 14.4 s (ref 11.4–15.2)

## 2018-10-07 MED ORDER — SODIUM CHLORIDE 0.9 % IV BOLUS
1000.0000 mL | Freq: Once | INTRAVENOUS | Status: AC
  Administered 2018-10-07: 1000 mL via INTRAVENOUS

## 2018-10-07 MED ORDER — SODIUM CHLORIDE 0.9 % IV BOLUS
500.0000 mL | Freq: Once | INTRAVENOUS | Status: AC
  Administered 2018-10-07: 500 mL via INTRAVENOUS

## 2018-10-07 MED ORDER — IOPAMIDOL (ISOVUE-300) INJECTION 61%
100.0000 mL | Freq: Once | INTRAVENOUS | Status: AC | PRN
  Administered 2018-10-07: 100 mL via INTRAVENOUS

## 2018-10-07 MED ORDER — OXYCODONE HCL 5 MG PO TABS
5.0000 mg | ORAL_TABLET | Freq: Once | ORAL | Status: AC
  Administered 2018-10-07: 5 mg via ORAL
  Filled 2018-10-07: qty 1

## 2018-10-07 MED ORDER — METHOCARBAMOL 500 MG PO TABS
1000.0000 mg | ORAL_TABLET | Freq: Once | ORAL | Status: AC
  Administered 2018-10-07: 1000 mg via ORAL
  Filled 2018-10-07: qty 2

## 2018-10-07 MED ORDER — OXYCODONE HCL 5 MG PO TABS
ORAL_TABLET | ORAL | Status: AC
  Filled 2018-10-07: qty 1

## 2018-10-07 MED ORDER — OXYCODONE HCL 5 MG PO TABS: 5.0000 mg | ORAL_TABLET | ORAL | 0 refills | Status: DC | PRN

## 2018-10-07 MED ORDER — METHOCARBAMOL 500 MG PO TABS: 500.0000 mg | ORAL_TABLET | Freq: Three times a day (TID) | ORAL | 0 refills | Status: DC | PRN

## 2018-10-07 NOTE — ED Notes (Signed)
CT notified that prescribing physician is Dr Karie Kirks

## 2018-10-07 NOTE — ED Notes (Signed)
To CT

## 2018-10-07 NOTE — ED Notes (Signed)
Frozen meal heated up and given to pt

## 2018-10-07 NOTE — ED Notes (Signed)
To Rad
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
## 2018-10-07 NOTE — ED Notes (Signed)
Unable to DC pt due to low BP at this time  Pt sipping water and reports thirsty

## 2018-10-07 NOTE — ED Notes (Addendum)
BP cuff to L arm  144/65 standing

## 2018-10-07 NOTE — ED Notes (Signed)
Pt ambulated in hall with assist if needed  He reports initial dizziness  Pain 4/10

## 2018-10-07 NOTE — ED Notes (Signed)
Pt finishing supper   He is informed to hold his metformin for the next 48 hours (2 days)

## 2018-10-08 NOTE — ED Notes (Signed)
Pt needing work note

## 2018-10-10 ENCOUNTER — Encounter (HOSPITAL_COMMUNITY): Payer: Self-pay

## 2018-10-10 ENCOUNTER — Encounter (HOSPITAL_COMMUNITY)
Admission: RE | Admit: 2018-10-10 | Discharge: 2018-10-10 | Disposition: A | Discharge status: Home / Self Care | Payer: BLUE CROSS/BLUE SHIELD | Source: Ambulatory Visit | Attending: Internal Medicine | Admitting: Internal Medicine

## 2018-10-14 ENCOUNTER — Telehealth (INDEPENDENT_AMBULATORY_CARE_PROVIDER_SITE_OTHER): Payer: Self-pay | Admitting: Internal Medicine

## 2018-10-14 NOTE — Telephone Encounter (Signed)
Please call Sarah at Dr Vickey Sages office - [phone] - regarding patients treatment of Hep A & B

## 2018-10-15 NOTE — Telephone Encounter (Signed)
Talked with Judson Roch , she states that they had called the [facility] and they do not do the Hep A and Hep B vaccinations.  She is going to talk with the NP and see if they might refer the patient to he [facility] . They will let us know the outcome for our documentation.

## 2018-10-24 ENCOUNTER — Ambulatory Visit (HOSPITAL_COMMUNITY)
Admission: RE | Admit: 2018-10-24 | Discharge: 2018-10-24 | Disposition: A | Discharge status: Home / Self Care | Payer: BLUE CROSS/BLUE SHIELD | Source: Ambulatory Visit | Attending: Internal Medicine | Admitting: Internal Medicine

## 2018-10-24 ENCOUNTER — Ambulatory Visit (HOSPITAL_COMMUNITY): Payer: BLUE CROSS/BLUE SHIELD | Admitting: Anesthesiology

## 2018-10-24 ENCOUNTER — Encounter (HOSPITAL_COMMUNITY)
Admission: RE | Disposition: A | Discharge status: Home / Self Care | Payer: Self-pay | Source: Ambulatory Visit | Attending: Internal Medicine

## 2018-10-24 ENCOUNTER — Encounter (HOSPITAL_COMMUNITY): Payer: Self-pay | Admitting: Anesthesiology

## 2018-10-24 DIAGNOSIS — K7581 Nonalcoholic steatohepatitis (NASH): Secondary | ICD-10-CM | POA: Insufficient documentation

## 2018-10-24 DIAGNOSIS — Z79899 Other long term (current) drug therapy: Secondary | ICD-10-CM | POA: Insufficient documentation

## 2018-10-24 DIAGNOSIS — F329 Major depressive disorder, single episode, unspecified: Secondary | ICD-10-CM | POA: Insufficient documentation

## 2018-10-24 DIAGNOSIS — K7469 Other cirrhosis of liver: Secondary | ICD-10-CM | POA: Diagnosis not present

## 2018-10-24 DIAGNOSIS — K228 Other specified diseases of esophagus: Secondary | ICD-10-CM | POA: Insufficient documentation

## 2018-10-24 DIAGNOSIS — K449 Diaphragmatic hernia without obstruction or gangrene: Secondary | ICD-10-CM

## 2018-10-24 DIAGNOSIS — K766 Portal hypertension: Secondary | ICD-10-CM | POA: Insufficient documentation

## 2018-10-24 DIAGNOSIS — Z7984 Long term (current) use of oral hypoglycemic drugs: Secondary | ICD-10-CM | POA: Insufficient documentation

## 2018-10-24 DIAGNOSIS — I8500 Esophageal varices without bleeding: Secondary | ICD-10-CM | POA: Diagnosis not present

## 2018-10-24 DIAGNOSIS — E119 Type 2 diabetes mellitus without complications: Secondary | ICD-10-CM | POA: Insufficient documentation

## 2018-10-24 DIAGNOSIS — M1990 Unspecified osteoarthritis, unspecified site: Secondary | ICD-10-CM | POA: Insufficient documentation

## 2018-10-24 DIAGNOSIS — K259 Gastric ulcer, unspecified as acute or chronic, without hemorrhage or perforation: Secondary | ICD-10-CM | POA: Insufficient documentation

## 2018-10-24 DIAGNOSIS — Z8711 Personal history of peptic ulcer disease: Secondary | ICD-10-CM | POA: Diagnosis not present

## 2018-10-24 DIAGNOSIS — Z87891 Personal history of nicotine dependence: Secondary | ICD-10-CM | POA: Insufficient documentation

## 2018-10-24 DIAGNOSIS — K3189 Other diseases of stomach and duodenum: Secondary | ICD-10-CM | POA: Insufficient documentation

## 2018-10-24 HISTORY — PX: ESOPHAGOGASTRODUODENOSCOPY (EGD) WITH PROPOFOL: SHX5813

## 2018-10-24 HISTORY — PX: ESOPHAGEAL BANDING: SHX5518

## 2018-10-24 LAB — GLUCOSE, CAPILLARY: GLUCOSE-CAPILLARY: 227 mg/dL — AB (ref 70–99)

## 2018-10-24 SURGERY — ESOPHAGOGASTRODUODENOSCOPY (EGD) WITH PROPOFOL
Anesthesia: Monitor Anesthesia Care

## 2018-10-24 MED ORDER — PROPOFOL 500 MG/50ML IV EMUL
INTRAVENOUS | Status: DC | PRN
  Administered 2018-10-24: 08:00:00 via INTRAVENOUS
  Administered 2018-10-24: 150 ug/kg/min via INTRAVENOUS

## 2018-10-24 MED ORDER — PROPOFOL 10 MG/ML IV BOLUS
INTRAVENOUS | Status: DC | PRN
  Administered 2018-10-24: 30 mg via INTRAVENOUS

## 2018-10-24 MED ORDER — CHLORHEXIDINE GLUCONATE CLOTH 2 % EX PADS: 6.0000 | MEDICATED_PAD | Freq: Once | CUTANEOUS | Status: DC

## 2018-10-24 MED ORDER — LACTATED RINGERS IV SOLN
INTRAVENOUS | Status: DC
  Administered 2018-10-24: 1000 mL via INTRAVENOUS

## 2018-10-24 MED ORDER — GLYCOPYRROLATE 0.2 MG/ML IJ SOLN
INTRAMUSCULAR | Status: DC | PRN
  Administered 2018-10-24: 0.2 mg via INTRAVENOUS

## 2018-10-24 MED ORDER — PANTOPRAZOLE SODIUM 40 MG PO TBEC: 40.0000 mg | DELAYED_RELEASE_TABLET | Freq: Every day | ORAL | 5 refills | Status: DC

## 2018-10-24 MED ORDER — PROPOFOL 10 MG/ML IV BOLUS
INTRAVENOUS | Status: AC
  Filled 2018-10-24: qty 80

## 2018-10-24 MED ORDER — LIDOCAINE HCL (CARDIAC) PF 50 MG/5ML IV SOSY
PREFILLED_SYRINGE | INTRAVENOUS | Status: DC | PRN
  Administered 2018-10-24: 40 mg via INTRAVENOUS

## 2018-10-24 MED ORDER — LIDOCAINE HCL (PF) 1 % IJ SOLN
INTRAMUSCULAR | Status: AC
  Filled 2018-10-24: qty 5

## 2018-10-24 MED ORDER — GLYCOPYRROLATE 0.2 MG/ML IJ SOLN
INTRAMUSCULAR | Status: AC
  Filled 2018-10-24: qty 1

## 2018-10-24 NOTE — Op Note (Signed)
[facility] Patient Name: Lawrence Rogers Procedure Date: 10/24/2018 7:11 AM MRN: [id number] Date of Birth: Oct 16, 1961 Attending MD: Hildred Laser , MD CSN: [id number] Age: 57 Admit Type: Outpatient Procedure:                Upper GI endoscopy Indications:              Follow-up of gastric ulcer, For therapy of                            esophageal varices Providers:                Hildred Laser, MD, Lurline Del, RN, Randa Spike,                            Technician Referring MD:             [name], MD Medicines:                Propofol per Anesthesia Complications:            No immediate complications. Estimated Blood Loss:     Estimated blood loss was minimal. Procedure:                Pre-Anesthesia Assessment:                           - Prior to the procedure, a History and Physical                            was performed, and patient medications and                            allergies were reviewed. The patient's tolerance of                            previous anesthesia was also reviewed. The risks                            and benefits of the procedure and the sedation                            options and risks were discussed with the patient.                            All questions were answered, and informed consent                            was obtained. Prior Anticoagulants: The patient has                            taken no previous anticoagulant or antiplatelet                            agents. ASA Grade Assessment: III - A patient with  severe systemic disease. After reviewing the risks                            and benefits, the patient was deemed in                            satisfactory condition to undergo the procedure.                           After obtaining informed consent, the endoscope was                            passed under direct vision. Throughout the                            procedure, the  patient's blood pressure, pulse, and                            oxygen saturations were monitored continuously. The                            [id number]-[id number] ([id number]) scope was introduced through the                            and advanced to the second part of duodenum. The                            upper GI endoscopy was accomplished without                            difficulty. The patient tolerated the procedure                            well. Scope In: 7:36:36 AM Scope Out: 7:50:17 AM Total Procedure Duration: 0 hours 13 minutes 41 seconds  Findings:      The proximal esophagus was normal.      Grade III varices were found in the mid esophagus and in the distal       esophagus. Three bands were successfully placed with incomplete       eradication of varices. There was no bleeding at the end of the       procedure.      A post variceal banding scar was found in the distal esophagus.      The Z-line was regular and was found 43 cm from the incisors.      A 2 cm hiatal hernia was present.      Moderate portal hypertensive gastropathy was found in the gastric       fundus, in the gastric body and in the gastric antrum.      A healed ulcer was found in the prepyloric region of the stomach.      Diffuse mildly congested mucosa without active bleeding and with no       stigmata of bleeding was found in the duodenal bulb and in the second       portion of the duodenum. Impression:               -  Normal proximal esophagus.                           - Grade III esophageal varices. Incompletely                            eradicated. Banded.                           - Scar in the distal esophagus.                           - Z-line regular, 43 cm from the incisors.                           - 2 cm hiatal hernia.                           - Portal hypertensive gastropathy.                           - Scar in the prepyloric region of the stomach.                           - Congested  duodenal mucosa.                           - No specimens collected. Moderate Sedation:      Per Anesthesia Care Recommendation:           - Patient has a contact number available for                            emergencies. The signs and symptoms of potential                            delayed complications were discussed with the                            patient. Return to normal activities tomorrow.                            Written discharge instructions were provided to the                            patient.                           - Full liquid diet today.                           - Continue present medications.                           - No aspirin, ibuprofen, naproxen, or other                            non-steroidal  anti-inflammatory drugs.                           - Return to GI clinic in 3 weeks. Procedure Code(s):        --- Professional ---                           [phone], Esophagogastroduodenoscopy, flexible,                            transoral; with band ligation of esophageal/gastric                            varices Diagnosis Code(s):        --- Professional ---                           I85.00, Esophageal varices without bleeding                           K22.8, Other specified diseases of esophagus                           K31.89, Other diseases of stomach and duodenum                           K44.9, Diaphragmatic hernia without obstruction or                            gangrene                           K76.6, Portal hypertension                           K25.9, Gastric ulcer, unspecified as acute or                            chronic, without hemorrhage or perforation CPT copyright 2018 American Medical Association. All rights reserved. The codes documented in this report are preliminary and upon coder review may  be revised to meet current compliance requirements. Hildred Laser, MD Hildred Laser, MD 10/24/2018 8:03:29 AM This report has been signed  electronically. Number of Addenda: 0

## 2018-10-24 NOTE — Anesthesia Preprocedure Evaluation (Signed)
Anesthesia Evaluation  Patient identified by MRN, date of birth, ID band Patient awake    Reviewed: Allergy & Precautions, H&P , NPO status , Patient's Chart, lab work & pertinent test results, reviewed documented beta blocker date and time   Airway Mallampati: II  TM Distance: >3 FB Neck ROM: full    Dental  (+) Edentulous Upper, Poor Dentition, Missing   Pulmonary neg pulmonary ROS, former smoker,    Pulmonary exam normal breath sounds clear to auscultation       Cardiovascular Exercise Tolerance: Good negative cardio ROS   Rhythm:regular Rate:Normal     Neuro/Psych PSYCHIATRIC DISORDERS Depression negative neurological ROS     GI/Hepatic Neg liver ROS, PUD,   Endo/Other  negative endocrine ROSdiabetes  Renal/GU negative Renal ROS  negative genitourinary   Musculoskeletal   Abdominal   Peds  Hematology negative hematology ROS (+)   Anesthesia Other Findings   Reproductive/Obstetrics negative OB ROS                             Anesthesia Physical Anesthesia Plan  ASA: III  Anesthesia Plan: MAC   Post-op Pain Management:    Induction:   PONV Risk Score and Plan:   Airway Management Planned:   Additional Equipment:   Intra-op Plan:   Post-operative Plan:   Informed Consent: I have reviewed the patients History and Physical, chart, labs and discussed the procedure including the risks, benefits and alternatives for the proposed anesthesia with the patient or authorized representative who has indicated his/her understanding and acceptance.   Dental Advisory Given  Plan Discussed with: CRNA  Anesthesia Plan Comments:         Anesthesia Quick Evaluation
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
## 2018-10-24 NOTE — Anesthesia Procedure Notes (Signed)
Procedure Name: MAC Date/Time: 10/24/2018 7:27 AM Performed by: Andree Elk  A, CRNA Pre-anesthesia Checklist: Patient identified, Emergency Drugs available, Suction available, Patient being monitored and Timeout performed Oxygen Delivery Method: Simple face mask

## 2018-10-24 NOTE — Transfer of Care (Signed)
Immediate Anesthesia Transfer of Care Note  Patient: Lawrence Rogers  Procedure(s) Performed: ESOPHAGOGASTRODUODENOSCOPY (EGD) WITH PROPOFOL (N/A ) ESOPHAGEAL BANDING  Patient Location: PACU  Anesthesia Type:MAC  Level of Consciousness: awake, alert , oriented and patient cooperative  Airway & Oxygen Therapy: Patient Spontanous Breathing  Post-op Assessment: Report given to RN and Post -op Vital signs reviewed and stable  Post vital signs: Reviewed and stable  Last Vitals:  Vitals Value Taken Time  BP 106/81 10/24/2018  7:57 AM  Temp    Pulse 107 10/24/2018  7:58 AM  Resp 19 10/24/2018  7:58 AM  SpO2 97 % 10/24/2018  7:58 AM  Vitals shown include unvalidated device data.  Last Pain:  Vitals:   10/24/18 0653  TempSrc: Oral  PainSc: 2       Patients Stated Pain Goal: 8 (30/17/20 9106)  Complications: No apparent anesthesia complications

## 2018-10-24 NOTE — Anesthesia Postprocedure Evaluation (Signed)
Anesthesia Post Note  Patient: Lawrence Rogers  Procedure(s) Performed: ESOPHAGOGASTRODUODENOSCOPY (EGD) WITH PROPOFOL (N/A ) ESOPHAGEAL BANDING  Patient location during evaluation: PACU Anesthesia Type: MAC Level of consciousness: awake and alert and oriented Pain management: pain level controlled Vital Signs Assessment: post-procedure vital signs reviewed and stable Respiratory status: spontaneous breathing Cardiovascular status: stable Postop Assessment: no apparent nausea or vomiting Anesthetic complications: no     Last Vitals:  Vitals:   10/24/18 0715 10/24/18 0720  BP: 103/70 99/71  Pulse:    Resp: 14 14  Temp:    SpO2: 97% 99%    Last Pain:  Vitals:   10/24/18 0653  TempSrc: Oral  PainSc: 2                  ADAMS, AMY A

## 2018-10-24 NOTE — Discharge Instructions (Signed)
°  Decrease pantoprazole to once daily.  Take 40 mg by mouth 30 minutes before breakfast daily. Resume other medications as before. Clear liquids today and then usual diet starting tomorrow morning. No driving for 24 hours. Office visit in 3 months.   Esophagogastroduodenoscopy, Care After Refer to this sheet in the next few weeks. These instructions provide you with information about caring for yourself after your procedure. Your health care provider may also give you more specific instructions. Your treatment has been planned according to current medical practices, but problems sometimes occur. Call your health care provider if you have any problems or questions after your procedure. What can I expect after the procedure? After the procedure, it is common to have:  A sore throat.  Nausea.  Bloating.  Dizziness.  Fatigue.  Follow these instructions at home:  Do not eat or drink anything until the numbing medicine (local anesthetic) has worn off and your gag reflex has returned. You will know that the local anesthetic has worn off when you can swallow comfortably.  Do not drive for 24 hours if you received a medicine to help you relax (sedative).  If your health care provider took a tissue sample for testing during the procedure, make sure to get your test results. This is your responsibility. Ask your health care provider or the department performing the test when your results will be ready.  Keep all follow-up visits as told by your health care provider. This is important. Contact a health care provider if:  You cannot stop coughing.  You are not urinating.  You are urinating less than usual. Get help right away if:  You have trouble swallowing.  You cannot eat or drink.  You have throat or chest pain that gets worse.  You are dizzy or light-headed.  You faint.  You have nausea or vomiting.  You have chills.  You have a fever.  You have severe abdominal  pain.  You have black, tarry, or bloody stools. This information is not intended to replace advice given to you by your health care provider. Make sure you discuss any questions you have with your health care provider. Document Released: 11/19/2012 Document Revised: 05/10/2016 Document Reviewed: 10/27/2015 Elsevier Interactive Patient Education  [employer].

## 2018-10-30 ENCOUNTER — Encounter (HOSPITAL_COMMUNITY): Payer: Self-pay | Admitting: Internal Medicine

## 2018-11-06 ENCOUNTER — Encounter (INDEPENDENT_AMBULATORY_CARE_PROVIDER_SITE_OTHER): Payer: Self-pay | Admitting: Internal Medicine

## 2018-11-06 ENCOUNTER — Ambulatory Visit (INDEPENDENT_AMBULATORY_CARE_PROVIDER_SITE_OTHER): Payer: BLUE CROSS/BLUE SHIELD | Admitting: Internal Medicine

## 2018-11-06 ENCOUNTER — Other Ambulatory Visit (INDEPENDENT_AMBULATORY_CARE_PROVIDER_SITE_OTHER): Payer: Self-pay | Admitting: *Deleted

## 2018-11-06 VITALS — BP 112/80 | HR 84 | Temp 98.2°F | Ht 74.0 in | Wt 219.2 lb

## 2018-11-06 DIAGNOSIS — K7460 Unspecified cirrhosis of liver: Secondary | ICD-10-CM

## 2018-11-06 DIAGNOSIS — R188 Other ascites: Secondary | ICD-10-CM | POA: Diagnosis not present

## 2018-11-06 DIAGNOSIS — K7031 Alcoholic cirrhosis of liver with ascites: Secondary | ICD-10-CM

## 2018-11-06 DIAGNOSIS — K760 Fatty (change of) liver, not elsewhere classified: Secondary | ICD-10-CM

## 2018-11-06 NOTE — Progress Notes (Addendum)
Subjective:    Patient ID: Lawrence Rogers, male    DOB: [birth year], 57 y.o.   MRN: [id number]  HPI Here today for f/u. Last seen in August of this year. Weight in August 229.Today his weight is 219.2. New onset of cirrhosis/ascites. Underwent 1st paracentesis in March of this year with the removal of 4 liters. Negative for SBP. Has undergone 3 paracentesis. Has not had a paracentesis since April.  Auto immune process ruled out . Has been evaluated at [facility] for possible transplant last month. Evaluated and was told he was not a candidate at this time. Will be seen back in 1 year.  He says he is doing pretty good.  Monday and Tuesday he had some nausea. None today.  Back to work full time. Trying to eat healthy. Maintaining a low fat diet 50/50 10/03/2018 AFP 4 10/03/2018 H and H 13.6 and 42.0, Platelet ct 104.    Has undergone 2 EGD's with banding.  Will finish Hepatitis A and B vaccine 1st of year.   Nadolol was stopped by Dr. Brigitte Pulse per medication list.   08/13/2018 [place] RUQ IMPRESSION: 1. Cirrhosis without focal liver lesion identified. 2. Small volume perihepatic ascites.  HA1C 9.7  10/2018.   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   03/24/2018 SMA less than 20, ANA negative, ceruloplasmin 32, alpha 1 antitrypsin 182,  AFP 3.2, PT/INR 1.0 and 11.0 10/03/2018 Acute Hepatitis Panel negative.    CMP Latest Ref Rng & Units 10/07/2018 08/06/2018 04/18/2018  Glucose 70 - 99 mg/dL 289(H) 366(H) 146(H)  BUN 6 - 20 mg/dL 14 14 12   Creatinine 0.61 - 1.24 mg/dL 0.91 0.92 0.65  Sodium 135 - 145 mmol/L 135 136 138  Potassium 3.5 - 5.1 mmol/L 4.2 3.9 3.9  Chloride 98 - 111 mmol/L 103 103 106  CO2 22 - 32 mmol/L 25 27 22   Calcium 8.9 - 10.3 mg/dL 9.3 9.2 9.4  Total Protein 6.5 - 8.1 g/dL 6.8 6.1 6.3(L)  Total Bilirubin 0.3 - 1.2 mg/dL 1.7(H) 1.0 1.8(H)  Alkaline Phos 38 - 126 U/L 128(H) - 109  AST 15 - 41 U/L 74(H) 49(H) 59(H)  ALT 0 - 44 U/L 82(H) 51(H)  50    CBC    Component Value Date/Time   WBC 9.1 10/07/2018 2037   RBC 4.86 10/07/2018 2037   HGB 13.6 10/07/2018 2037   HCT 42.7 10/07/2018 2037   PLT 83 (L) 10/07/2018 2037   MCV 87.9 10/07/2018 2037   MCH 28.0 10/07/2018 2037   MCHC 31.9 10/07/2018 2037   RDW 15.1 10/07/2018 2037   LYMPHSABS 1.5 10/07/2018 1733   MONOABS 0.4 10/07/2018 1733   EOSABS 0.1 10/07/2018 1733   BASOSABS 0.0 10/07/2018 1733    Review of Systems Past Medical History:  Diagnosis Date  . Arthritis   . Cirrhosis ([place])   . Depression   . Diabetes [facility])     Past Surgical History:  Procedure Laterality Date  . ESOPHAGEAL BANDING N/A 08/29/2018   Procedure: ESOPHAGEAL BANDING;  Surgeon: Rogene Houston, MD;  [facility];  Service: Endoscopy;  Laterality: N/A;  . ESOPHAGEAL BANDING  10/24/2018   Procedure: ESOPHAGEAL BANDING;  Surgeon: Rogene Houston, MD;  [facility];  Service: Endoscopy;;  3 bands  . ESOPHAGOGASTRODUODENOSCOPY (EGD) WITH PROPOFOL N/A 08/29/2018   Procedure: ESOPHAGOGASTRODUODENOSCOPY (EGD) WITH PROPOFOL;  Surgeon: Rogene Houston, MD;  [facility];  Service: Endoscopy;  Laterality:  N/A;  9:30  . ESOPHAGOGASTRODUODENOSCOPY (EGD) WITH PROPOFOL N/A 10/24/2018   Procedure: ESOPHAGOGASTRODUODENOSCOPY (EGD) WITH PROPOFOL;  Surgeon: Rogene Houston, MD;  [facility];  Service: Endoscopy;  Laterality: N/A;  7:30  . IR PARACENTESIS  04/18/2018  . IR TRANSCATHETER BX  04/18/2018  . IR US GUIDE VASC ACCESS RIGHT  04/18/2018  . IR VENOGRAM HEPATIC W HEMODYNAMIC EVALUATION  04/18/2018    No Known Allergies  Current Outpatient Medications on File Prior to Visit  Medication Sig Dispense Refill  . cholecalciferol (VITAMIN D3) 25 MCG (1000 UT) tablet Take 1,000 Units by mouth daily.    . furosemide (LASIX) 40 MG tablet Take 80 mg by mouth daily.     . metFORMIN (GLUCOPHAGE-XR) 500 MG 24 hr tablet Take 1,000 mg by mouth 2 (two) times daily.  2  .  methocarbamol (ROBAXIN) 500 MG tablet Take 1 tablet (500 mg total) by mouth every 8 (eight) hours as needed for muscle spasms. 30 tablet 0  . pantoprazole (PROTONIX) 40 MG tablet Take 1 tablet (40 mg total) by mouth daily before breakfast. 30 tablet 5  . spironolactone (ALDACTONE) 100 MG tablet TAKE ONE TABLET (100MG TOTAL) BY MOUTH DAILY (Patient taking differently: Take 200 mg by mouth daily. ) 30 tablet 3  . triamcinolone cream (KENALOG) 0.5 % Apply 1 application topically daily as needed (irritation).   1   No current facility-administered medications on file prior to visit.         Objective:   Physical Exam Blood pressure 112/80, pulse 84, temperature 98.2 F (36.8 C), height 6' 2"  (1.88 m), weight 219 lb 3.2 oz (99.4 kg). Alert and oriented. Skin warm and dry. Oral mucosa is moist.   . Sclera anicteric, conjunctivae is pink. Thyroid not enlarged. No cervical lymphadenopathy. Lungs clear. Heart regular rate and rhythm.  Abdomen is soft. Bowel sounds are positive. No hepatomegaly. No abdominal masses felt. No tenderness.  No edema to lower extremities.          Assessment & Plan:  Cirrhosis with ascites. NAFLD. He needs to diet and exercise. OV in 4 months.  Needs to get BS under control.

## 2018-11-06 NOTE — Patient Instructions (Addendum)
Diet and exercise.  Labs in 3 months.  Milk Thistle

## 2019-01-12 ENCOUNTER — Other Ambulatory Visit (INDEPENDENT_AMBULATORY_CARE_PROVIDER_SITE_OTHER): Payer: Self-pay | Admitting: *Deleted

## 2019-01-12 ENCOUNTER — Encounter (INDEPENDENT_AMBULATORY_CARE_PROVIDER_SITE_OTHER): Payer: Self-pay | Admitting: *Deleted

## 2019-01-12 DIAGNOSIS — K760 Fatty (change of) liver, not elsewhere classified: Secondary | ICD-10-CM

## 2019-01-12 DIAGNOSIS — K7460 Unspecified cirrhosis of liver: Secondary | ICD-10-CM

## 2019-01-12 DIAGNOSIS — R188 Other ascites: Secondary | ICD-10-CM

## 2019-02-07 LAB — COMPREHENSIVE METABOLIC PANEL
AG RATIO: 1.9 (calc) (ref 1.0–2.5)
ALBUMIN MSPROF: 3.7 g/dL (ref 3.6–5.1)
ALKALINE PHOSPHATASE (APISO): 108 U/L (ref 35–144)
ALT: 55 U/L — ABNORMAL HIGH (ref 9–46)
AST: 56 U/L — AB (ref 10–35)
BILIRUBIN TOTAL: 1.4 mg/dL — AB (ref 0.2–1.2)
BUN: 15 mg/dL (ref 7–25)
CHLORIDE: 106 mmol/L (ref 98–110)
CO2: 25 mmol/L (ref 20–32)
Calcium: 9.1 mg/dL (ref 8.6–10.3)
Creat: 0.77 mg/dL (ref 0.70–1.33)
Globulin: 2 g/dL (calc) (ref 1.9–3.7)
Glucose, Bld: 121 mg/dL — ABNORMAL HIGH (ref 65–99)
POTASSIUM: 4.3 mmol/L (ref 3.5–5.3)
Sodium: 138 mmol/L (ref 135–146)
Total Protein: 5.7 g/dL — ABNORMAL LOW (ref 6.1–8.1)

## 2019-02-07 LAB — CBC
HCT: 35 % — ABNORMAL LOW (ref 38.5–50.0)
Hemoglobin: 11.6 g/dL — ABNORMAL LOW (ref 13.2–17.1)
MCH: 28.6 pg (ref 27.0–33.0)
MCHC: 33.1 g/dL (ref 32.0–36.0)
MCV: 86.2 fL (ref 80.0–100.0)
MPV: 12 fL (ref 7.5–12.5)
PLATELETS: 94 10*3/uL — AB (ref 140–400)
RBC: 4.06 10*6/uL — ABNORMAL LOW (ref 4.20–5.80)
RDW: 13.8 % (ref 11.0–15.0)
WBC: 4.9 10*3/uL (ref 3.8–10.8)

## 2019-02-07 LAB — AMMONIA: Ammonia: 112 umol/L — ABNORMAL HIGH (ref <=72)

## 2019-02-18 ENCOUNTER — Telehealth (INDEPENDENT_AMBULATORY_CARE_PROVIDER_SITE_OTHER): Payer: Self-pay | Admitting: Internal Medicine

## 2019-02-18 ENCOUNTER — Other Ambulatory Visit (INDEPENDENT_AMBULATORY_CARE_PROVIDER_SITE_OTHER): Payer: Self-pay | Admitting: *Deleted

## 2019-02-18 ENCOUNTER — Other Ambulatory Visit (INDEPENDENT_AMBULATORY_CARE_PROVIDER_SITE_OTHER): Payer: Self-pay | Admitting: Internal Medicine

## 2019-02-18 DIAGNOSIS — K760 Fatty (change of) liver, not elsewhere classified: Secondary | ICD-10-CM

## 2019-02-18 DIAGNOSIS — K7469 Other cirrhosis of liver: Secondary | ICD-10-CM

## 2019-02-18 MED ORDER — LACTULOSE 20 GM/30ML PO SOLN: 30.0000 mL | Freq: Two times a day (BID) | ORAL | 3 refills | Status: DC

## 2019-02-18 NOTE — Progress Notes (Signed)
Lactulose ordered

## 2019-02-18 NOTE — Telephone Encounter (Signed)
This has been ordered and the patient will be sent a letter as a reminder.

## 2019-02-18 NOTE — Telephone Encounter (Signed)
Lawrence Rogers, Ammonia in 4 weeks.

## 2019-03-05 ENCOUNTER — Encounter (INDEPENDENT_AMBULATORY_CARE_PROVIDER_SITE_OTHER): Payer: Self-pay | Admitting: *Deleted

## 2019-03-05 ENCOUNTER — Other Ambulatory Visit (INDEPENDENT_AMBULATORY_CARE_PROVIDER_SITE_OTHER): Payer: Self-pay | Admitting: *Deleted

## 2019-03-05 DIAGNOSIS — K760 Fatty (change of) liver, not elsewhere classified: Secondary | ICD-10-CM

## 2019-03-10 ENCOUNTER — Other Ambulatory Visit: Payer: Self-pay

## 2019-03-10 ENCOUNTER — Telehealth (INDEPENDENT_AMBULATORY_CARE_PROVIDER_SITE_OTHER): Payer: Self-pay | Admitting: Internal Medicine

## 2019-03-10 ENCOUNTER — Ambulatory Visit (INDEPENDENT_AMBULATORY_CARE_PROVIDER_SITE_OTHER): Payer: BLUE CROSS/BLUE SHIELD | Admitting: Internal Medicine

## 2019-03-10 DIAGNOSIS — K7469 Other cirrhosis of liver: Secondary | ICD-10-CM

## 2019-03-10 DIAGNOSIS — R188 Other ascites: Secondary | ICD-10-CM

## 2019-03-10 NOTE — Progress Notes (Addendum)
Virtual Visit via Telephone Note Time 11:15am-1130pm. Total time 15 minutes. Unable to do video OV.  Telephone OV due to COVID-19.   I connected with Lawrence Rogers on 03/10/19 at 11:00 AM EDT by telephone and verified that I am speaking with the correct person using two identifiers. Patient was at home and was agreeable to this visit.    I discussed the limitations, risks, security and privacy concerns of performing an evaluation and management service by telephone and the availability of in person appointments. I also discussed with the patient that there may be a patient responsible charge related to this service. The patient expressed understanding and agreed to proceed.   History of Present Illness: Hx of NAFLD with cirrhosis, complicated by ascites..  Last seen 11/06/2018.   Wt in November 219, Today his stated weight  228. States he has some abdominal swelling, but not tense. He is exercising by walking. His appetite has remained good. Has received the Hep A and B vaccine.  02/06/2019 Ammon 117 He was started on Lactulose. 10/07/2018 PT/INR 14.4 and 1.13.  Auto immune process ruled out.  Underwent a liver biopsy in May of 2019 which revealed cirrhosis (stage 4 of 4 of uncertain etiology. Mild active steatohepatitis.  Has been evaluated at [facility] for possible transplant. He was told his Meld score was too low.  Has undergone 2 EGDs with bandings last year.  Three paracentesis last year.  Last paracentesis in May of 2019.  Past Medical History:  Diagnosis Date  . Arthritis   . Cirrhosis ([employer])   . Depression   . Diabetes [facility]     Past Surgical History:  Procedure Laterality Date  . ESOPHAGEAL BANDING N/A 08/29/2018   Procedure: ESOPHAGEAL BANDING;  Surgeon: Rogene Houston, MD;  [facility];  Service: Endoscopy;  Laterality: N/A;  . ESOPHAGEAL BANDING  10/24/2018   Procedure: ESOPHAGEAL BANDING;  Surgeon: Rogene Houston, MD;  [facility];   Service: Endoscopy;;  3 bands  . ESOPHAGOGASTRODUODENOSCOPY (EGD) WITH PROPOFOL N/A 08/29/2018   Procedure: ESOPHAGOGASTRODUODENOSCOPY (EGD) WITH PROPOFOL;  Surgeon: Rogene Houston, MD;  [facility];  Service: Endoscopy;  Laterality: N/A;  9:30  . ESOPHAGOGASTRODUODENOSCOPY (EGD) WITH PROPOFOL N/A 10/24/2018   Procedure: ESOPHAGOGASTRODUODENOSCOPY (EGD) WITH PROPOFOL;  Surgeon: Rogene Houston, MD;  [facility];  Service: Endoscopy;  Laterality: N/A;  7:30  . IR PARACENTESIS  04/18/2018  . IR TRANSCATHETER BX  04/18/2018  . IR US GUIDE VASC ACCESS RIGHT  04/18/2018  . IR VENOGRAM HEPATIC W HEMODYNAMIC EVALUATION  04/18/2018    No Known Allergies  Current Outpatient Medications on File Prior to Visit  Medication Sig Dispense Refill  . cholecalciferol (VITAMIN D3) 25 MCG (1000 UT) tablet Take 1,000 Units by mouth daily.    . furosemide (LASIX) 40 MG tablet Take 80 mg by mouth daily.     . Lactulose 20 GM/30ML SOLN Take 30 mLs (20 g total) by mouth 2 (two) times daily. 450 mL 3  . metFORMIN (GLUCOPHAGE-XR) 500 MG 24 hr tablet Take 1,000 mg by mouth 2 (two) times daily.  2  . pantoprazole (PROTONIX) 40 MG tablet Take 1 tablet (40 mg total) by mouth daily before breakfast. 30 tablet 5  . spironolactone (ALDACTONE) 100 MG tablet TAKE ONE TABLET (100MG TOTAL) BY MOUTH DAILY (Patient taking differently: Take 200 mg by mouth daily. ) 30 tablet 3  . traMADol (ULTRAM) 50 MG tablet Take by  mouth every 6 (six) hours as needed.    . triamcinolone cream (KENALOG) 0.5 % Apply 1 application topically daily as needed (irritation).   1   No current facility-administered medications on file prior to visit.        Observations/Objective:   Assessment and Plan: NAFLD/Cirrhosis complicated by ascites. Auto immune process ruled out. AFP, ammonia, [place] RUQ in 2 weeks.  Follow Up Instructions: OV in 6 months. Rx for Lactulose called to his pharmacy ([place])  I discussed the  assessment and treatment plan with the patient. The patient was provided an opportunity to ask questions and all were answered. The patient agreed with the plan and demonstrated an understanding of the instructions.   The patient was advised to call back or seek an in-person evaluation if the symptoms worsen or if the condition fails to improve as anticipated.  I provided 15 minutes of non-face-to-face time during this encounter.   Butch Penny, NP

## 2019-03-10 NOTE — Patient Instructions (Signed)
Labs and [place]. Rx for Lactulose called to his pharmacy.

## 2019-04-10 ENCOUNTER — Ambulatory Visit (HOSPITAL_COMMUNITY)
Admission: RE | Admit: 2019-04-10 | Discharge: 2019-04-10 | Disposition: A | Discharge status: Home / Self Care | Payer: BLUE CROSS/BLUE SHIELD | Source: Ambulatory Visit | Attending: Internal Medicine | Admitting: Internal Medicine

## 2019-04-10 ENCOUNTER — Other Ambulatory Visit: Payer: Self-pay

## 2019-04-10 DIAGNOSIS — K7469 Other cirrhosis of liver: Secondary | ICD-10-CM | POA: Diagnosis present

## 2019-04-30 ENCOUNTER — Other Ambulatory Visit (INDEPENDENT_AMBULATORY_CARE_PROVIDER_SITE_OTHER): Payer: Self-pay | Admitting: Internal Medicine

## 2019-06-15 ENCOUNTER — Other Ambulatory Visit (INDEPENDENT_AMBULATORY_CARE_PROVIDER_SITE_OTHER): Payer: Self-pay | Admitting: Internal Medicine

## 2019-06-15 ENCOUNTER — Telehealth (INDEPENDENT_AMBULATORY_CARE_PROVIDER_SITE_OTHER): Payer: Self-pay | Admitting: Internal Medicine

## 2019-06-15 ENCOUNTER — Other Ambulatory Visit: Payer: Self-pay

## 2019-06-15 ENCOUNTER — Other Ambulatory Visit: Payer: BC Managed Care – PPO

## 2019-06-15 DIAGNOSIS — Z20822 Contact with and (suspected) exposure to covid-19: Secondary | ICD-10-CM

## 2019-06-15 DIAGNOSIS — R188 Other ascites: Secondary | ICD-10-CM

## 2019-06-15 NOTE — Telephone Encounter (Signed)
Patient called stated needs paracentesis - please call him at [phone]

## 2019-06-15 NOTE — Telephone Encounter (Signed)
[place] para sch'd 06/18/19 at 930, patient aware

## 2019-06-15 NOTE — Telephone Encounter (Signed)
Lawrence Rogers, US paracentesis

## 2019-06-18 ENCOUNTER — Ambulatory Visit (HOSPITAL_COMMUNITY): Payer: BC Managed Care – PPO

## 2019-06-18 LAB — NOVEL CORONAVIRUS, NAA: SARS-CoV-2, NAA: NOT DETECTED

## 2019-07-19 ENCOUNTER — Emergency Department (HOSPITAL_COMMUNITY): Payer: BC Managed Care – PPO

## 2019-07-19 ENCOUNTER — Other Ambulatory Visit: Payer: Self-pay

## 2019-07-19 ENCOUNTER — Inpatient Hospital Stay (HOSPITAL_COMMUNITY)
Admission: EM | Admit: 2019-07-19 | Discharge: 2019-07-30 | DRG: 163 | Disposition: A | Discharge status: Home / Self Care | Payer: BC Managed Care – PPO | Attending: Internal Medicine | Admitting: Internal Medicine

## 2019-07-19 ENCOUNTER — Encounter (HOSPITAL_COMMUNITY): Payer: Self-pay | Admitting: Emergency Medicine

## 2019-07-19 DIAGNOSIS — R0902 Hypoxemia: Secondary | ICD-10-CM

## 2019-07-19 DIAGNOSIS — R161 Splenomegaly, not elsewhere classified: Secondary | ICD-10-CM | POA: Diagnosis present

## 2019-07-19 DIAGNOSIS — I700 Atherosclerosis of aorta: Secondary | ICD-10-CM | POA: Diagnosis present

## 2019-07-19 DIAGNOSIS — Z833 Family history of diabetes mellitus: Secondary | ICD-10-CM

## 2019-07-19 DIAGNOSIS — Z87891 Personal history of nicotine dependence: Secondary | ICD-10-CM

## 2019-07-19 DIAGNOSIS — K7460 Unspecified cirrhosis of liver: Secondary | ICD-10-CM | POA: Diagnosis present

## 2019-07-19 DIAGNOSIS — J869 Pyothorax without fistula: Secondary | ICD-10-CM | POA: Diagnosis not present

## 2019-07-19 DIAGNOSIS — J918 Pleural effusion in other conditions classified elsewhere: Secondary | ICD-10-CM | POA: Diagnosis present

## 2019-07-19 DIAGNOSIS — Z09 Encounter for follow-up examination after completed treatment for conditions other than malignant neoplasm: Secondary | ICD-10-CM

## 2019-07-19 DIAGNOSIS — E119 Type 2 diabetes mellitus without complications: Secondary | ICD-10-CM

## 2019-07-19 DIAGNOSIS — R188 Other ascites: Secondary | ICD-10-CM

## 2019-07-19 DIAGNOSIS — Z7984 Long term (current) use of oral hypoglycemic drugs: Secondary | ICD-10-CM

## 2019-07-19 DIAGNOSIS — J939 Pneumothorax, unspecified: Secondary | ICD-10-CM

## 2019-07-19 DIAGNOSIS — E1165 Type 2 diabetes mellitus with hyperglycemia: Secondary | ICD-10-CM | POA: Diagnosis present

## 2019-07-19 DIAGNOSIS — D696 Thrombocytopenia, unspecified: Secondary | ICD-10-CM | POA: Diagnosis present

## 2019-07-19 DIAGNOSIS — J9811 Atelectasis: Secondary | ICD-10-CM | POA: Diagnosis present

## 2019-07-19 DIAGNOSIS — K219 Gastro-esophageal reflux disease without esophagitis: Secondary | ICD-10-CM | POA: Diagnosis present

## 2019-07-19 DIAGNOSIS — K7581 Nonalcoholic steatohepatitis (NASH): Secondary | ICD-10-CM | POA: Diagnosis present

## 2019-07-19 DIAGNOSIS — R591 Generalized enlarged lymph nodes: Secondary | ICD-10-CM | POA: Diagnosis present

## 2019-07-19 DIAGNOSIS — G8921 Chronic pain due to trauma: Secondary | ICD-10-CM | POA: Diagnosis present

## 2019-07-19 DIAGNOSIS — Z79891 Long term (current) use of opiate analgesic: Secondary | ICD-10-CM

## 2019-07-19 DIAGNOSIS — J9601 Acute respiratory failure with hypoxia: Secondary | ICD-10-CM | POA: Diagnosis present

## 2019-07-19 DIAGNOSIS — Z20828 Contact with and (suspected) exposure to other viral communicable diseases: Secondary | ICD-10-CM | POA: Diagnosis present

## 2019-07-19 DIAGNOSIS — R918 Other nonspecific abnormal finding of lung field: Secondary | ICD-10-CM | POA: Diagnosis present

## 2019-07-19 DIAGNOSIS — Z79899 Other long term (current) drug therapy: Secondary | ICD-10-CM

## 2019-07-19 DIAGNOSIS — Z9889 Other specified postprocedural states: Secondary | ICD-10-CM

## 2019-07-19 DIAGNOSIS — J90 Pleural effusion, not elsewhere classified: Secondary | ICD-10-CM

## 2019-07-19 DIAGNOSIS — R0603 Acute respiratory distress: Secondary | ICD-10-CM | POA: Diagnosis not present

## 2019-07-19 DIAGNOSIS — K766 Portal hypertension: Secondary | ICD-10-CM | POA: Diagnosis present

## 2019-07-19 DIAGNOSIS — K7290 Hepatic failure, unspecified without coma: Secondary | ICD-10-CM

## 2019-07-19 DIAGNOSIS — I8510 Secondary esophageal varices without bleeding: Secondary | ICD-10-CM | POA: Diagnosis present

## 2019-07-19 DIAGNOSIS — R0602 Shortness of breath: Secondary | ICD-10-CM

## 2019-07-19 LAB — ALBUMIN, PLEURAL OR PERITONEAL FLUID: Albumin, Fluid: 1.2 g/dL

## 2019-07-19 LAB — COMPREHENSIVE METABOLIC PANEL
ALT: 48 U/L — ABNORMAL HIGH (ref 0–44)
AST: 70 U/L — ABNORMAL HIGH (ref 15–41)
Albumin: 3.7 g/dL (ref 3.5–5.0)
Alkaline Phosphatase: 117 U/L (ref 38–126)
Anion gap: 10 (ref 5–15)
BUN: 12 mg/dL (ref 6–20)
CO2: 21 mmol/L — ABNORMAL LOW (ref 22–32)
Calcium: 9.2 mg/dL (ref 8.9–10.3)
Chloride: 105 mmol/L (ref 98–111)
Creatinine, Ser: 0.78 mg/dL (ref 0.61–1.24)
GFR calc Af Amer: >60 mL/min (ref >60)
GFR calc non Af Amer: >60 mL/min (ref >60)
Glucose, Bld: 169 mg/dL — ABNORMAL HIGH (ref 70–99)
Potassium: 3.7 mmol/L (ref 3.5–5.1)
Sodium: 136 mmol/L (ref 135–145)
Total Bilirubin: 2.1 mg/dL — ABNORMAL HIGH (ref 0.3–1.2)
Total Protein: 6.4 g/dL — ABNORMAL LOW (ref 6.5–8.1)

## 2019-07-19 LAB — CBC WITH DIFFERENTIAL/PLATELET
Abs Immature Granulocytes: 0.01 10*3/uL (ref 0.00–0.07)
Basophils Absolute: 0 10*3/uL (ref 0.0–0.1)
Basophils Relative: 0 %
Eosinophils Absolute: 0 10*3/uL (ref 0.0–0.5)
Eosinophils Relative: 0 %
HCT: 40 % (ref 39.0–52.0)
Hemoglobin: 12 g/dL — ABNORMAL LOW (ref 13.0–17.0)
Immature Granulocytes: 0 %
Lymphocytes Relative: 7 %
Lymphs Abs: 0.5 10*3/uL — ABNORMAL LOW (ref 0.7–4.0)
MCH: 25.3 pg — ABNORMAL LOW (ref 26.0–34.0)
MCHC: 30 g/dL (ref 30.0–36.0)
MCV: 84.4 fL (ref 80.0–100.0)
Monocytes Absolute: 0.3 10*3/uL (ref 0.1–1.0)
Monocytes Relative: 4 %
Neutro Abs: 6.7 10*3/uL (ref 1.7–7.7)
Neutrophils Relative %: 89 %
Platelets: 92 10*3/uL — ABNORMAL LOW (ref 150–400)
RBC: 4.74 MIL/uL (ref 4.22–5.81)
RDW: 17 % — ABNORMAL HIGH (ref 11.5–15.5)
WBC: 7.6 10*3/uL (ref 4.0–10.5)
nRBC: 0 % (ref 0.0–0.2)

## 2019-07-19 LAB — GRAM STAIN

## 2019-07-19 LAB — URINALYSIS, ROUTINE W REFLEX MICROSCOPIC
Bilirubin Urine: NEGATIVE
Glucose, UA: NEGATIVE mg/dL
Ketones, ur: NEGATIVE mg/dL
Nitrite: NEGATIVE
Protein, ur: NEGATIVE mg/dL
Specific Gravity, Urine: 1.036 — ABNORMAL HIGH (ref 1.005–1.030)
pH: 5 (ref 5.0–8.0)

## 2019-07-19 LAB — GLUCOSE, CAPILLARY
Glucose-Capillary: 236 mg/dL — ABNORMAL HIGH (ref 70–99)
Glucose-Capillary: 194 mg/dL — ABNORMAL HIGH (ref 70–99)

## 2019-07-19 LAB — TROPONIN I (HIGH SENSITIVITY)
Troponin I (High Sensitivity): 3 ng/L (ref <18)
Troponin I (High Sensitivity): 3 ng/L (ref <18)

## 2019-07-19 LAB — GLUCOSE, PLEURAL OR PERITONEAL FLUID: Glucose, Fluid: 97 mg/dL

## 2019-07-19 LAB — BODY FLUID CELL COUNT WITH DIFFERENTIAL
Eos, Fluid: 0 %
Lymphs, Fluid: 5 %
Monocyte-Macrophage-Serous Fluid: 4 % — ABNORMAL LOW (ref 50–90)
Neutrophil Count, Fluid: 91 % — ABNORMAL HIGH (ref 0–25)
Total Nucleated Cell Count, Fluid: 22531 cu mm — ABNORMAL HIGH (ref 0–1000)

## 2019-07-19 LAB — CBC
HCT: 34.8 % — ABNORMAL LOW (ref 39.0–52.0)
Hemoglobin: 10.6 g/dL — ABNORMAL LOW (ref 13.0–17.0)
MCH: 25.2 pg — ABNORMAL LOW (ref 26.0–34.0)
MCHC: 30.5 g/dL (ref 30.0–36.0)
MCV: 82.9 fL (ref 80.0–100.0)
Platelets: 78 10*3/uL — ABNORMAL LOW (ref 150–400)
RBC: 4.2 MIL/uL — ABNORMAL LOW (ref 4.22–5.81)
RDW: 17.1 % — ABNORMAL HIGH (ref 11.5–15.5)
WBC: 8.1 10*3/uL (ref 4.0–10.5)
nRBC: 0 % (ref 0.0–0.2)

## 2019-07-19 LAB — PROTEIN, PLEURAL OR PERITONEAL FLUID: Total protein, fluid: <3 g/dL

## 2019-07-19 LAB — LACTATE DEHYDROGENASE, PLEURAL OR PERITONEAL FLUID: LD, Fluid: 84 U/L — ABNORMAL HIGH (ref 3–23)

## 2019-07-19 LAB — D-DIMER, QUANTITATIVE: D-Dimer, Quant: 0.77 ug/mL-FEU — ABNORMAL HIGH (ref 0.00–0.50)

## 2019-07-19 LAB — SARS CORONAVIRUS 2 BY RT PCR (HOSPITAL ORDER, PERFORMED IN ~~LOC~~ HOSPITAL LAB): SARS Coronavirus 2: NEGATIVE

## 2019-07-19 LAB — LIPASE, BLOOD: Lipase: 24 U/L (ref 11–51)

## 2019-07-19 MED ORDER — PANTOPRAZOLE SODIUM 40 MG PO TBEC
40.0000 mg | DELAYED_RELEASE_TABLET | Freq: Every day | ORAL | Status: DC
  Administered 2019-07-20 – 2019-07-30 (×10): 40 mg via ORAL
  Filled 2019-07-19 (×10): qty 1

## 2019-07-19 MED ORDER — INSULIN ASPART 100 UNIT/ML ~~LOC~~ SOLN
0.0000 [IU] | Freq: Three times a day (TID) | SUBCUTANEOUS | Status: DC
  Administered 2019-07-20: 12:00:00 2 [IU] via SUBCUTANEOUS
  Administered 2019-07-21: 17:00:00 3 [IU] via SUBCUTANEOUS
  Administered 2019-07-21 – 2019-07-22 (×2): 2 [IU] via SUBCUTANEOUS
  Administered 2019-07-22: 17:00:00 8 [IU] via SUBCUTANEOUS
  Administered 2019-07-23 – 2019-07-24 (×4): 3 [IU] via SUBCUTANEOUS
  Administered 2019-07-25: 5 [IU] via SUBCUTANEOUS
  Administered 2019-07-26 (×2): 3 [IU] via SUBCUTANEOUS
  Administered 2019-07-27: 5 [IU] via SUBCUTANEOUS
  Administered 2019-07-28 (×2): 2 [IU] via SUBCUTANEOUS
  Administered 2019-07-28: 5 [IU] via SUBCUTANEOUS
  Administered 2019-07-29 (×2): 2 [IU] via SUBCUTANEOUS
  Administered 2019-07-29: 5 [IU] via SUBCUTANEOUS
  Administered 2019-07-30: 3 [IU] via SUBCUTANEOUS
  Administered 2019-07-30: 2 [IU] via SUBCUTANEOUS

## 2019-07-19 MED ORDER — MORPHINE SULFATE (PF) 4 MG/ML IV SOLN
4.0000 mg | Freq: Once | INTRAVENOUS | Status: AC
  Administered 2019-07-19: 4 mg via INTRAVENOUS
  Filled 2019-07-19: qty 1

## 2019-07-19 MED ORDER — LIDOCAINE-EPINEPHRINE (PF) 2 %-1:200000 IJ SOLN
10.0000 mL | Freq: Once | INTRAMUSCULAR | Status: AC
  Administered 2019-07-19: 10 mL via INTRADERMAL

## 2019-07-19 MED ORDER — GLIPIZIDE 5 MG PO TABS: 5.0000 mg | ORAL_TABLET | Freq: Every day | ORAL | Status: DC

## 2019-07-19 MED ORDER — ALBUTEROL SULFATE (2.5 MG/3ML) 0.083% IN NEBU
5.0000 mg | INHALATION_SOLUTION | Freq: Once | RESPIRATORY_TRACT | Status: DC
  Filled 2019-07-19: qty 6

## 2019-07-19 MED ORDER — FUROSEMIDE 80 MG PO TABS
80.0000 mg | ORAL_TABLET | Freq: Every day | ORAL | Status: DC
  Administered 2019-07-20 – 2019-07-21 (×2): 80 mg via ORAL
  Filled 2019-07-19 (×2): qty 1

## 2019-07-19 MED ORDER — ENOXAPARIN SODIUM 40 MG/0.4ML ~~LOC~~ SOLN
40.0000 mg | SUBCUTANEOUS | Status: DC
  Administered 2019-07-19 – 2019-07-21 (×3): 40 mg via SUBCUTANEOUS
  Filled 2019-07-19 (×3): qty 0.4

## 2019-07-19 MED ORDER — INSULIN GLARGINE 100 UNIT/ML ~~LOC~~ SOLN
10.0000 [IU] | Freq: Every day | SUBCUTANEOUS | Status: DC
  Administered 2019-07-19 – 2019-07-29 (×10): 10 [IU] via SUBCUTANEOUS
  Filled 2019-07-19 (×14): qty 0.1

## 2019-07-19 MED ORDER — SPIRONOLACTONE 25 MG PO TABS
200.0000 mg | ORAL_TABLET | Freq: Every day | ORAL | Status: DC
  Administered 2019-07-20 – 2019-07-21 (×2): 200 mg via ORAL
  Filled 2019-07-19 (×2): qty 8

## 2019-07-19 MED ORDER — TRAMADOL HCL 50 MG PO TABS
100.0000 mg | ORAL_TABLET | Freq: Four times a day (QID) | ORAL | Status: DC | PRN
  Administered 2019-07-19 – 2019-07-28 (×13): 100 mg via ORAL
  Filled 2019-07-19 (×13): qty 2

## 2019-07-19 MED ORDER — INSULIN ASPART 100 UNIT/ML ~~LOC~~ SOLN
0.0000 [IU] | Freq: Every day | SUBCUTANEOUS | Status: DC
  Administered 2019-07-19: 2 [IU] via SUBCUTANEOUS
  Administered 2019-07-21: 22:00:00 1 [IU] via SUBCUTANEOUS
  Administered 2019-07-22 – 2019-07-29 (×2): 2 [IU] via SUBCUTANEOUS

## 2019-07-19 MED ORDER — METHOCARBAMOL 500 MG PO TABS
500.0000 mg | ORAL_TABLET | Freq: Three times a day (TID) | ORAL | Status: DC | PRN
  Administered 2019-07-19 – 2019-07-24 (×3): 500 mg via ORAL
  Filled 2019-07-19 (×3): qty 1

## 2019-07-19 MED ORDER — IOHEXOL 350 MG/ML SOLN
100.0000 mL | Freq: Once | INTRAVENOUS | Status: AC | PRN
  Administered 2019-07-19: 100 mL via INTRAVENOUS

## 2019-07-19 NOTE — ED Triage Notes (Signed)
Patient c/o shortness of breath and mid back pain that started today while out shopping. Denies any chest pain. Per patient took 129m of tramadol and used ice pack, 2 hours prior to arriving to ED, with no relief. Patient reports trying to get appointment x1 month for paracentesis. Ascites noted.

## 2019-07-19 NOTE — ED Notes (Signed)
Patient transported to CT 

## 2019-07-19 NOTE — H&P (Signed)
History and Physical  [name] CVE:[id number] DOB: Oct 05, 1961 DOA: 07/19/2019  Referring physician: Coral Ceo, PA-C, ED provider PCP: [name], MD  Outpatient Specialists:   Patient Coming From: Home  Chief Complaint: Shortness of breath  HPI: Lawrence Rogers is a 58 y.o. male with a history of fatty liver cirrhosis, diabetes, depression.  Patient seen for shortness of breath that is been gradually increasing over the past month, but acutely worse since this morning.  Patient has known pleural effusion and has been trying to get a thoracentesis.  He was required to get a COVID test prior to the procedure, which he did last month but was still required to wait.  He presents today with acute shortness of breath that is worse with exertion and improved with rest.  No other palliating or provoking factors.  Denies fevers, chills, nausea, vomiting.  In the past, he has had a paracentesis a few times, which is largely due to ascites secondary to his cirrhosis.  Emergency Department Course: CTA shows complete collapse of his right lower lobe and partial collapse of his right middle lobe.  He does have some mild lymphadenopathy and 3 small lung nodules measuring between 6 and 7 mm.  Thoracentesis done by ED physician ultrasound-guided.  Review of Systems:   Pt denies any fevers, chills, nausea, vomiting, diarrhea, constipation, abdominal pain, palpitations, headache, vision changes, lightheadedness, dizziness, melena, rectal bleeding.  Review of systems are otherwise negative  Past Medical History:  Diagnosis Date   Arthritis    Cirrhosis ([place])    Depression    Diabetes ([place])    Past Surgical History:  Procedure Laterality Date   ESOPHAGEAL BANDING N/A 08/29/2018   Procedure: ESOPHAGEAL BANDING;  Surgeon: Rogene Houston, MD;  [facility];  Service: Endoscopy;  Laterality: N/A;   ESOPHAGEAL BANDING  10/24/2018   Procedure: ESOPHAGEAL BANDING;  Surgeon:  Rogene Houston, MD;  [facility];  Service: Endoscopy;;  3 bands   ESOPHAGOGASTRODUODENOSCOPY (EGD) WITH PROPOFOL N/A 08/29/2018   Procedure: ESOPHAGOGASTRODUODENOSCOPY (EGD) WITH PROPOFOL;  Surgeon: Rogene Houston, MD;  [facility];  Service: Endoscopy;  Laterality: N/A;  9:30   ESOPHAGOGASTRODUODENOSCOPY (EGD) WITH PROPOFOL N/A 10/24/2018   Procedure: ESOPHAGOGASTRODUODENOSCOPY (EGD) WITH PROPOFOL;  Surgeon: Rogene Houston, MD;  [facility];  Service: Endoscopy;  Laterality: N/A;  7:30   IR PARACENTESIS  04/18/2018   IR TRANSCATHETER BX  04/18/2018   IR US GUIDE VASC ACCESS RIGHT  04/18/2018   IR VENOGRAM HEPATIC W HEMODYNAMIC EVALUATION  04/18/2018   Social History:  reports that he has quit smoking. His smoking use included cigarettes. He has never used smokeless tobacco. He reports that he does not drink alcohol or use drugs. Patient lives at home  No Known Allergies  No family history on file.  Family history of diabetes  Prior to Admission medications   Medication Sig Start Date End Date Taking? Authorizing Provider  furosemide (LASIX) 40 MG tablet Take 80 mg by mouth daily.    Yes [provider]  glipiZIDE (GLUCOTROL) 5 MG tablet Take 5 mg by mouth daily before breakfast.   Yes [provider]  metFORMIN (GLUCOPHAGE-XR) 500 MG 24 hr tablet Take 1,000 mg by mouth 2 (two) times daily. 05/26/18  Yes [provider]  methocarbamol (ROBAXIN) 500 MG tablet Take 500 mg by mouth every 8 (eight) hours as needed for muscle spasms.   Yes [provider]  pantoprazole (  PROTONIX) 40 MG tablet TAKE ONE TABLET (40MG TOTAL) BY MOUTH DAILY BEFORE BREAKFAST 05/04/19  Yes Rehman, Mechele Dawley, MD  spironolactone (ALDACTONE) 100 MG tablet TAKE ONE TABLET (100MG TOTAL) BY MOUTH DAILY Patient taking differently: Take 200 mg by mouth daily.  07/31/18  Yes Setzer, Rona Ravens, NP  traMADol (ULTRAM) 50 MG tablet Take 100 mg by mouth every 6  (six) hours as needed.    Yes [provider]  triamcinolone cream (KENALOG) 0.5 % Apply 1 application topically daily as needed (irritation).  06/30/18  Yes [provider]    Physical Exam: BP 132/70    Pulse 99    Temp 98.1 F (36.7 C) (Oral)    Resp 17    Ht 6' 1"  (1.854 m)    Wt 99.8 kg    SpO2 95%    BMI 29.03 kg/m    General: 53 Caucasian male. Awake and alert and oriented x3. No acute cardiopulmonary distress.   HEENT: Normocephalic atraumatic.  Right and left ears normal in appearance.  Pupils equal, round, reactive to light. Extraocular muscles are intact. Sclerae anicteric and noninjected.  Moist mucosal membranes. No mucosal lesions.   Neck: Neck supple without lymphadenopathy. No carotid bruits. No masses palpated.   Cardiovascular: Regular rate with normal S1-S2 sounds. No murmurs, rubs, gallops auscultated. No JVD.   Respiratory: Absent breath sounds in the right lower lobe and diminished breath sounds in the right middle lobe.  No rales or rhonchi.  No accessory muscle use.  Abdomen: Abdomen is significantly distended.  Abdomen soft and nontender. Active bowel sounds. No masses or hepatosplenomegaly   Skin: No rashes, lesions, or ulcerations.  Dry, warm to touch. 2+ dorsalis pedis and radial pulses.  Musculoskeletal: No calf or leg pain. All major joints not erythematous nontender.  No upper or lower joint deformation.  Good ROM.  No contractures   Psychiatric: Intact judgment and insight. Pleasant and cooperative.  Neurologic: No focal neurological deficits. Strength is 5/5 and symmetric in upper and lower extremities.  Cranial nerves II through XII are grossly intact.           Labs on Admission: I have personally reviewed following labs and imaging studies  CBC: Recent Labs  Lab 07/19/19 1231  WBC 7.6  NEUTROABS 6.7  HGB 12.0*  HCT 40.0  MCV 84.4  PLT 92*   Basic Metabolic Panel: Recent Labs  Lab 07/19/19 1231  NA 136  K  3.7  CL 105  CO2 21*  GLUCOSE 169*  BUN 12  CREATININE 0.78  CALCIUM 9.2   GFR: Estimated Creatinine Clearance: 126.7 mL/min (by C-G formula based on SCr of 0.78 mg/dL). Liver Function Tests: Recent Labs  Lab 07/19/19 1231  AST 70*  ALT 48*  ALKPHOS 117  BILITOT 2.1*  PROT 6.4*  ALBUMIN 3.7   Recent Labs  Lab 07/19/19 1237  LIPASE 24   No results for input(s): AMMONIA in the last 168 hours. Coagulation Profile: No results for input(s): INR, PROTIME in the last 168 hours. Cardiac Enzymes: No results for input(s): CKTOTAL, CKMB, CKMBINDEX, TROPONINI in the last 168 hours. BNP (last 3 results) No results for input(s): PROBNP in the last 8760 hours. HbA1C: No results for input(s): HGBA1C in the last 72 hours. CBG: No results for input(s): GLUCAP in the last 168 hours. Lipid Profile: No results for input(s): CHOL, HDL, LDLCALC, TRIG, CHOLHDL, LDLDIRECT in the last 72 hours. Thyroid Function Tests: No results for input(s): TSH, T4TOTAL, FREET4,  T3FREE, THYROIDAB in the last 72 hours. Anemia Panel: No results for input(s): VITAMINB12, FOLATE, FERRITIN, TIBC, IRON, RETICCTPCT in the last 72 hours. Urine analysis:    Component Value Date/Time   COLORURINE YELLOW 07/19/2019 1553   APPEARANCEUR HAZY (A) 07/19/2019 1553   LABSPEC 1.036 (H) 07/19/2019 1553   PHURINE 5.0 07/19/2019 1553   GLUCOSEU NEGATIVE 07/19/2019 1553   HGBUR SMALL (A) 07/19/2019 1553   BILIRUBINUR NEGATIVE 07/19/2019 1553   KETONESUR NEGATIVE 07/19/2019 1553   PROTEINUR NEGATIVE 07/19/2019 1553   NITRITE NEGATIVE 07/19/2019 1553   LEUKOCYTESUR TRACE (A) 07/19/2019 1553   Sepsis Labs: @LABRCNTIP (procalcitonin:4,lacticidven:4) ) Recent Results (from the past 240 hour(s))  SARS Coronavirus 2 [facility] order, Performed in [facility] hospital lab) Nasopharyngeal Nasopharyngeal Swab     Status: None   Collection Time: 07/19/19 12:32 PM   Specimen: Nasopharyngeal Swab  Result Value Ref Range Status     SARS Coronavirus 2 NEGATIVE NEGATIVE Final    Comment: (NOTE) If result is NEGATIVE SARS-CoV-2 target nucleic acids are NOT DETECTED. The SARS-CoV-2 RNA is generally detectable in upper and lower  respiratory specimens during the acute phase of infection. The lowest  concentration of SARS-CoV-2 viral copies this assay can detect is 250  copies / mL. A negative result does not preclude SARS-CoV-2 infection  and should not be used as the sole basis for treatment or other  patient management decisions.  A negative result may occur with  improper specimen collection / handling, submission of specimen other  than nasopharyngeal swab, presence of viral mutation(s) within the  areas targeted by this assay, and inadequate number of viral copies  (<250 copies / mL). A negative result must be combined with clinical  observations, patient history, and epidemiological information. If result is POSITIVE SARS-CoV-2 target nucleic acids are DETECTED. The SARS-CoV-2 RNA is generally detectable in upper and lower  respiratory specimens dur ing the acute phase of infection.  Positive  results are indicative of active infection with SARS-CoV-2.  Clinical  correlation with patient history and other diagnostic information is  necessary to determine patient infection status.  Positive results do  not rule out bacterial infection or co-infection with other viruses. If result is PRESUMPTIVE POSTIVE SARS-CoV-2 nucleic acids MAY BE PRESENT.   A presumptive positive result was obtained on the submitted specimen  and confirmed on repeat testing.  While 2019 novel coronavirus  (SARS-CoV-2) nucleic acids may be present in the submitted sample  additional confirmatory testing may be necessary for epidemiological  and / or clinical management purposes  to differentiate between  SARS-CoV-2 and other Sarbecovirus currently known to infect humans.  If clinically indicated additional testing with an alternate test   methodology [phone]) is advised. The SARS-CoV-2 RNA is generally  detectable in upper and lower respiratory sp ecimens during the acute  phase of infection. The expected result is Negative. Fact Sheet for Patients:  StrictlyIdeas.no Fact Sheet for Healthcare Providers: BankingDealers.co.za This test is not yet approved or cleared by the [place] FDA and has been authorized for detection and/or diagnosis of SARS-CoV-2 by FDA under an Emergency Use Authorization (EUA).  This EUA will remain in effect (meaning this test can be used) for the duration of the COVID-19 declaration under Section 564(b)(1) of the Act, 21 U.S.C. section 360bbb-3(b)(1), unless the authorization is terminated or revoked sooner. Performed at [facility], [address], [place], [place] [address]   Culture, body fluid-bottle     Status: None (Preliminary result)  Collection Time: 07/19/19  4:18 PM   Specimen: Pleura  Result Value Ref Range Status   Specimen Description PLEURAL BOTTLES DRAWN AEROBIC AND ANAEROBIC  Final   Special Requests   Final    10CC Performed at [facility], [address], [place], [place] [address]    Culture PENDING  Incomplete   Report Status PENDING  Incomplete  Gram stain     Status: None   Collection Time: 07/19/19  4:18 PM   Specimen: Pleura  Result Value Ref Range Status   Specimen Description PLEURAL  Final   Special Requests NONE  Final   Gram Stain   Final    CYTOSPIN SMEAR NO ORGANISMS SEEN WBC PRESENT,BOTH PMN AND MONONUCLEAR Performed at [facility], [address], [place], [place] [address]    Report Status 07/19/2019 FINAL  Final     Radiological Exams on Admission: Dg Chest 2 View  Result Date: 07/19/2019 CLINICAL DATA:  Post right thoracentesis in ER, did inspiration and expiration images EXAM: CHEST - 2 VIEW COMPARISON:  Current CTA of the chest. FINDINGS: There is still significant opacity extending from  the mid lung to the lung base obscuring the right heart border and right hemidiaphragm. This is consistent with residual fluid and atelectasis. No pneumothorax. Minimal left effusion, stable. Mild left lung base atelectasis. Left lung otherwise clear. IMPRESSION: 1. No pneumothorax or evidence of a complication following thoracentesis. 2. There is still significant opacity on the right consistent with residual pleural fluid and atelectasis. Electronically Signed   By: Lajean Manes M.D.   On: 07/19/2019 16:57   Ct Angio Chest Pe W And/or Wo Contrast  Result Date: 07/19/2019 CLINICAL DATA:  Patient c/o shortness of breath and mid back pain that started today while out shopping. Denies any chest pain. Per patient took 124m of tramadol and used ice pack, 2 hours prior to arriving to ED, with no relief. Patient reports trying to get appointment x1 month for paracentesis. Ascites noted.Hx of DM, cirrhosis. D-dimer .727EXAM: CT ANGIOGRAPHY CHEST WITH CONTRAST TECHNIQUE: Multidetector CT imaging of the chest was performed using the standard protocol during bolus administration of intravenous contrast. Multiplanar CT image reconstructions and MIPs were obtained to evaluate the vascular anatomy. CONTRAST:  1051mOMNIPAQUE IOHEXOL 350 MG/ML SOLN COMPARISON:  Chest radiograph dated 10/07/2018. Abdomen and pelvis CT, 10/07/2018. FINDINGS: Cardiovascular: There is satisfactory opacification of the central pulmonary arteries. The segmental and smaller branches are less well-defined due to some respiratory motion and some dispersion of the contrast bolus. Allowing for this, there is no evidence of a pulmonary embolism. Heart is normal in size. No pericardial effusion. No significant coronary artery calcifications. Great vessels are normal in caliber. No aortic dissection. Minimal aortic atherosclerosis. Mediastinum/Nodes: Numerous subcentimeter neck base and mediastinal lymph nodes. 9 are enlarged by size criteria. Visualized  thyroid is unremarkable. Trachea is patent. 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 Lungs/Pleura: Large right pleural effusion occupying most of the right hemithorax. Right lower lobe is completely collapsed. There is partial atelectasis of the right middle lobe and mild dependent atelectasis in the right upper lobe. Minimal left pleural effusion. No convincing centrally obstructing mass or lesion. Small cavitary nodule, subpleural left anterior upper lobe, image 46, series 10, 7 mm. 7 mm nodule, subpleural posteromedial left lower lobe, image 111, series 10. Probable additional nodule, left lower lobe, image 115, series 10, 6 mm. No evidence of pneumonia or pulmonary edema. No pneumothorax. Minor dependent  atelectasis in the posterior inferior left lower lobe. Upper Abdomen: Changes of advanced cirrhosis with a nodular liver, extensive venous collaterals and splenomegaly as well as a small amount of ascites. There are prominent and enlarged Peri celiac and gastrohepatic ligament lymph nodes as well as retroperitoneal lymph nodes. Adenopathy and ascites have increased compared to the prior CT. Musculoskeletal: No fracture or acute finding. No osteoblastic or osteolytic lesions. Review of the MIP images confirms the above findings. IMPRESSION: 1. Large right-sided pleural effusion with complete collapse of the right lower lobe and partial atelectasis of the right middle lobe and mild dependent atelectasis in the right upper lobe. 2. Minimal left pleural effusion. 3. Three left lung nodules. Two in the left lower lobe would be in the included field of view from the prior abdomen and pelvis CT, with a are not visualized. Metastatic/neoplastic disease is possible. 4. Multiple subcentimeter neck base and mediastinal lymph nodes, more numerous than typically seen, but nonenlarged. There are prominent and enlarged Peri celiac, gastrohepatic ligament and  retroperitoneal lymph nodes increased in size and number from the prior CT. 5. Changes of advanced cirrhosis with portal venous hypertension, the latter reflected by extensive varices, splenomegaly and ascites. No defined liver mass visualized, although this would be best assessed with liver MRI with and without contrast. Aortic Atherosclerosis (ICD10-I70.0). Electronically Signed   By: Lajean Manes M.D.   On: 07/19/2019 15:12    EKG: Independently reviewed.  Sinus tachycardia with no ST changes.  Assessment/Plan: Principal Problem:   Acute respiratory distress Active Problems:   Diabetes ([place])   Cirrhosis of liver with ascites ([facility])   Pleural effusion    This patient was discussed with the ED physician, including pertinent vitals, physical exam findings, labs, and imaging.  We also discussed care given by the ED provider.  1. Acute respiratory distress a. Continue oxygen therapy 2. Pleural effusion a. Status post thoracentesis by EDP b. Will continue with oxygen therapy c. We will evaluate how patient progresses symptomatically.  May need to consult IR for repeat thoracentesis if patient continues to be symptomatic. d. Question whether the patient would benefit from paracentesis as well 3. Cirrhosis of the liver with ascites a.  4. Diabetes a. Hold metformin due to CTA b. We will give patient Lantus overnight c. Sliding scale insulin with CBGs before meals and nightly  DVT prophylaxis: Lovenox Consultants: None Code Status: Full code Family Communication: None Disposition Plan: Patient should be able to return home following stabilization   Truett Mainland, DO

## 2019-07-19 NOTE — ED Notes (Signed)
Hospitalist at bedside 

## 2019-07-19 NOTE — ED Provider Notes (Signed)
THORACENTESIS BEDSIDE  Date/Time: 07/19/2019 4:16 PM Performed by: Noemi Chapel, MD Authorized by: Noemi Chapel, MD   Consent:    Consent obtained:  Written   Consent given by:  Patient   Risks discussed:  Bleeding, infection, pain, pneumothorax and incomplete drainage   Alternatives discussed:  No treatment, delayed treatment and alternative treatment Anesthesia (see MAR for exact dosages):    Anesthesia method:  Local infiltration   Local anesthetic:  Lidocaine 1% w/o epi Procedure details:    Preparation: Patient was prepped and draped in usual sterile fashion     Patient position:  Sitting   Location:  R posterior   Intercostal space:  6th   Puncture method:  Over-the-needle catheter   Ultrasound guidance: no     Indwelling catheter placed: no     Needle gauge:  18   Number of attempts:  1   Drainage characteristics:  Serosanguinous Post-procedure details:    Chest x-ray performed: yes     Chest x-ray findings:  Pleural effusion improved   Patient tolerance of procedure:  Tolerated well, no immediate complications Comments:          This patient presents with absent lung sounds in the right side of his chest, increasing shortness of breath and dyspnea and appears to have a large pleural effusion on the right with almost complete compression of the right lung.  He was consented to a thoracentesis, this was done successfully as above, 1 L of fluid was removed and samples removed for laboratory evaluation.  The case was discussed with the hospitalist Dr. Sheran Lawless.  The patient will need a post procedure chest x-ray and repeat evaluation to make sure that his dyspnea has improved.  Admit if unable to tolerate O2 at home.    Noemi Chapel, MD 07/21/19 2044

## 2019-07-19 NOTE — ED Notes (Signed)
Pt ambulated around nursing station on RA. O2 sats 94% prior to ambulating. Pt's O2 sat increased to 97% with ambulation, but pt's work of breathing increased. He had labored breathing and pt stated, "I feel like my breathing is so heavy". Pt returned to bed and upon return to bed, pt's O2 sat was 90% on RA. Pt was placed back on O2 via [place] at 2L with O2 sat increase to 94%. MD and PA notified.

## 2019-07-19 NOTE — Sedation Documentation (Signed)
Dr. Sabra Heck withdrew 1 L via thoracentesis of clear dark yellow fluid from right lung cavity. Pt tolerated procedure well and fluid sent to lab.
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
## 2019-07-20 DIAGNOSIS — R188 Other ascites: Secondary | ICD-10-CM | POA: Diagnosis present

## 2019-07-20 DIAGNOSIS — J90 Pleural effusion, not elsewhere classified: Secondary | ICD-10-CM | POA: Diagnosis present

## 2019-07-20 DIAGNOSIS — D696 Thrombocytopenia, unspecified: Secondary | ICD-10-CM | POA: Diagnosis present

## 2019-07-20 DIAGNOSIS — K7460 Unspecified cirrhosis of liver: Secondary | ICD-10-CM

## 2019-07-20 DIAGNOSIS — R0603 Acute respiratory distress: Secondary | ICD-10-CM | POA: Diagnosis not present

## 2019-07-20 DIAGNOSIS — I8510 Secondary esophageal varices without bleeding: Secondary | ICD-10-CM | POA: Diagnosis present

## 2019-07-20 DIAGNOSIS — Z20828 Contact with and (suspected) exposure to other viral communicable diseases: Secondary | ICD-10-CM | POA: Diagnosis present

## 2019-07-20 DIAGNOSIS — K219 Gastro-esophageal reflux disease without esophagitis: Secondary | ICD-10-CM | POA: Diagnosis present

## 2019-07-20 DIAGNOSIS — R591 Generalized enlarged lymph nodes: Secondary | ICD-10-CM | POA: Diagnosis present

## 2019-07-20 DIAGNOSIS — J9601 Acute respiratory failure with hypoxia: Secondary | ICD-10-CM | POA: Diagnosis present

## 2019-07-20 DIAGNOSIS — K7581 Nonalcoholic steatohepatitis (NASH): Secondary | ICD-10-CM | POA: Diagnosis present

## 2019-07-20 DIAGNOSIS — Z87891 Personal history of nicotine dependence: Secondary | ICD-10-CM | POA: Diagnosis not present

## 2019-07-20 DIAGNOSIS — Z79891 Long term (current) use of opiate analgesic: Secondary | ICD-10-CM | POA: Diagnosis not present

## 2019-07-20 DIAGNOSIS — J918 Pleural effusion in other conditions classified elsewhere: Secondary | ICD-10-CM | POA: Diagnosis present

## 2019-07-20 DIAGNOSIS — R161 Splenomegaly, not elsewhere classified: Secondary | ICD-10-CM | POA: Diagnosis present

## 2019-07-20 DIAGNOSIS — J9811 Atelectasis: Secondary | ICD-10-CM | POA: Diagnosis present

## 2019-07-20 DIAGNOSIS — Z7984 Long term (current) use of oral hypoglycemic drugs: Secondary | ICD-10-CM | POA: Diagnosis not present

## 2019-07-20 DIAGNOSIS — K766 Portal hypertension: Secondary | ICD-10-CM | POA: Diagnosis present

## 2019-07-20 DIAGNOSIS — E1165 Type 2 diabetes mellitus with hyperglycemia: Secondary | ICD-10-CM | POA: Diagnosis present

## 2019-07-20 DIAGNOSIS — Z79899 Other long term (current) drug therapy: Secondary | ICD-10-CM | POA: Diagnosis not present

## 2019-07-20 DIAGNOSIS — J869 Pyothorax without fistula: Secondary | ICD-10-CM | POA: Diagnosis not present

## 2019-07-20 DIAGNOSIS — I700 Atherosclerosis of aorta: Secondary | ICD-10-CM | POA: Diagnosis present

## 2019-07-20 DIAGNOSIS — Z833 Family history of diabetes mellitus: Secondary | ICD-10-CM | POA: Diagnosis not present

## 2019-07-20 DIAGNOSIS — R918 Other nonspecific abnormal finding of lung field: Secondary | ICD-10-CM | POA: Diagnosis present

## 2019-07-20 DIAGNOSIS — G8921 Chronic pain due to trauma: Secondary | ICD-10-CM | POA: Diagnosis present

## 2019-07-20 LAB — BASIC METABOLIC PANEL
Anion gap: 7 (ref 5–15)
BUN: 10 mg/dL (ref 6–20)
CO2: 22 mmol/L (ref 22–32)
Calcium: 8.6 mg/dL — ABNORMAL LOW (ref 8.9–10.3)
Chloride: 105 mmol/L (ref 98–111)
Creatinine, Ser: 0.75 mg/dL (ref 0.61–1.24)
GFR calc Af Amer: >60 mL/min (ref >60)
GFR calc non Af Amer: >60 mL/min (ref >60)
Glucose, Bld: 182 mg/dL — ABNORMAL HIGH (ref 70–99)
Potassium: 3.7 mmol/L (ref 3.5–5.1)
Sodium: 134 mmol/L — ABNORMAL LOW (ref 135–145)

## 2019-07-20 LAB — COMPREHENSIVE METABOLIC PANEL
ALT: 39 U/L (ref 0–44)
AST: 47 U/L — ABNORMAL HIGH (ref 15–41)
Albumin: 3 g/dL — ABNORMAL LOW (ref 3.5–5.0)
Alkaline Phosphatase: 84 U/L (ref 38–126)
Anion gap: 8 (ref 5–15)
BUN: 11 mg/dL (ref 6–20)
CO2: 22 mmol/L (ref 22–32)
Calcium: 8.5 mg/dL — ABNORMAL LOW (ref 8.9–10.3)
Chloride: 104 mmol/L (ref 98–111)
Creatinine, Ser: 0.66 mg/dL (ref 0.61–1.24)
GFR calc Af Amer: >60 mL/min (ref >60)
GFR calc non Af Amer: >60 mL/min (ref >60)
Glucose, Bld: 117 mg/dL — ABNORMAL HIGH (ref 70–99)
Potassium: 3.6 mmol/L (ref 3.5–5.1)
Sodium: 134 mmol/L — ABNORMAL LOW (ref 135–145)
Total Bilirubin: 2.6 mg/dL — ABNORMAL HIGH (ref 0.3–1.2)
Total Protein: 5.3 g/dL — ABNORMAL LOW (ref 6.5–8.1)

## 2019-07-20 LAB — GLUCOSE, CAPILLARY
Glucose-Capillary: 105 mg/dL — ABNORMAL HIGH (ref 70–99)
Glucose-Capillary: 133 mg/dL — ABNORMAL HIGH (ref 70–99)
Glucose-Capillary: 108 mg/dL — ABNORMAL HIGH (ref 70–99)
Glucose-Capillary: 143 mg/dL — ABNORMAL HIGH (ref 70–99)

## 2019-07-20 LAB — PH, BODY FLUID: pH, Body Fluid: 7.2

## 2019-07-20 LAB — HEMOGLOBIN A1C
Hgb A1c MFr Bld: 7 % — ABNORMAL HIGH (ref 4.8–5.6)
Mean Plasma Glucose: 154.2 mg/dL

## 2019-07-20 LAB — LACTATE DEHYDROGENASE: LDH: 114 U/L (ref 98–192)

## 2019-07-20 MED ORDER — ONDANSETRON HCL 4 MG/2ML IJ SOLN: 4.0000 mg | Freq: Four times a day (QID) | INTRAMUSCULAR | Status: DC | PRN

## 2019-07-20 MED ORDER — VANCOMYCIN HCL IN DEXTROSE 1-5 GM/200ML-% IV SOLN
1000.0000 mg | Freq: Three times a day (TID) | INTRAVENOUS | Status: DC
  Administered 2019-07-20 – 2019-07-23 (×8): 1000 mg via INTRAVENOUS
  Filled 2019-07-20 (×10): qty 200

## 2019-07-20 MED ORDER — VANCOMYCIN HCL IN DEXTROSE 1-5 GM/200ML-% IV SOLN
1000.0000 mg | Freq: Once | INTRAVENOUS | Status: AC
  Administered 2019-07-20: 1000 mg via INTRAVENOUS
  Filled 2019-07-20: qty 200

## 2019-07-20 MED ORDER — SODIUM CHLORIDE 0.9 % IV SOLN
INTRAVENOUS | Status: DC | PRN
  Administered 2019-07-20: 11:00:00 250 mL via INTRAVENOUS

## 2019-07-20 MED ORDER — SODIUM CHLORIDE 0.9 % IV SOLN
2.0000 g | INTRAVENOUS | Status: DC
  Administered 2019-07-20 – 2019-07-30 (×11): 2 g via INTRAVENOUS
  Filled 2019-07-20 (×2): qty 20
  Filled 2019-07-20 (×6): qty 2
  Filled 2019-07-20 (×2): qty 20
  Filled 2019-07-20 (×2): qty 2

## 2019-07-20 NOTE — Progress Notes (Signed)
Report given to [facility] with [facility]. Stated ETA 20 minutes. Patient aware of transfer and [facility] en route. Donavan Foil, RN

## 2019-07-20 NOTE — Progress Notes (Signed)
Patient to transfer to [facility] 4E [id number]-01. Notified [facility] of need for transport, stated would have truck available soon. Called report to [facility], receiving nurse on 4E. Pt in room awaiting [facility] transport for transfer. States comfortable at this time. Donavan Foil, RN

## 2019-07-20 NOTE — Progress Notes (Signed)
Patient left unit via stretcher with [facility] transport. Transferred to [facility] 4E. Donavan Foil, RN

## 2019-07-20 NOTE — Progress Notes (Signed)
PROGRESS NOTE  Lawrence Rogers RAX:[id number] DOB: 02-28-1961 DOA: 07/19/2019 PCP: [name], MD  Brief History:  58 year old male with a history of NASH Cirrhosis, diabetes mellitus type 2, and GERD presenting with 1 month history of shortness of breath and dyspnea on exertion.  He stated that his breathing and dyspnea on exertion got significantly worse in the 24 hours prior to admission.  In addition, he began developing right-sided pleuritic chest pain.  He had denied any fevers, chills, nausea, vomiting, diarrhea, abdominal pain, headache, coughing, hemoptysis. In the emergency department, the patient had a low-grade temperature of 99.6 F but remained hemodynamically stable.  He required supplemental oxygen at 3 L for his hypoxia.  WBC was 7.6 at the time of admission.  CT angiogram of the chest showed a large right-sided pleural effusion with complete collapse of the right lower lobe.  There is also some scattered left-sided lung nodules.  There was no pulmonary embolus.  Thoracocentesis was performed in the emergency department draining 1 L of fluid.  Pleural fluid studies show an exudate with WBC 22,531.  Preliminary Gram stain shows gram-positive cocci.  The patient was started on vancomycin and ceftriaxone.  He has been transferred to [facility] for thoracic surgery consultation for empyema and possible decortication.  Assessment/Plan: Acute respiratory failure with hypoxia -Secondary to pleural effusion/empyema -Currently stable on 3 L nasal cannula -Wean oxygen as tolerated for saturation greater than 92%  Lung Empyema -Pleural fluid shows 22,531 WBC -Lights criteria suggests exudate as expected -start vanco and ceftriaxone -transfer to [facility] for possible decortication -I have spoken with TCTS already  NASH Liver Cirrhosis with ascites -Continue furosemide and spironolactone  Diabetes mellitus type 2 -Check hemoglobin A1c -Continue NovoLog sliding  scale -Holding metformin and glipizide  GERD -Continue Protonix     Disposition Plan:   Transfer to [facility] Family Communication:   Daughter updated on phone 07/20/19  Consultants:  TCTS  Code Status:  FULL   DVT Prophylaxis: Jerauld Lovenox   Procedures: As Listed in Progress Note Above  Antibiotics: Vancomycin 8/3>>> Ceftriaxone 8/3>>>       Subjective: Patient continues to have pleuritic right-sided chest pain.  He is still having dyspnea on exertion although better since the thoracocentesis.  He denies any headache, nausea, vomiting, diarrhea, abdominal pain, hemoptysis, dysuria, hematochezia, melena.  Objective: Vitals:   07/19/19 2022 07/20/19 0000 07/20/19 0500 07/20/19 0843  BP:   139/68   Pulse:  90 91   Resp:   18   Temp:   98.7 F (37.1 C)   TempSrc:   Oral   SpO2: 98% 97% 96% 96%  Weight:      Height:        Intake/Output Summary (Last 24 hours) at 07/20/2019 1042 Last data filed at 07/20/2019 0413 Gross per 24 hour  Intake 240 ml  Output 600 ml  Net -360 ml   Weight change:  Exam:   General:  Pt is alert, follows commands appropriately, not in acute distress  HEENT: No icterus, No thrush, No neck mass, [place]/AT  Cardiovascular: RRR, S1/S2, no rubs, no gallops  Respiratory: Bibasilar crackles.  Diminished breath sounds on the right.  Abdomen: Soft/+BS, non tender, non distended, no guarding  Extremities: 2 + LEedema, No lymphangitis, No petechiae, No rashes, no synovitis   Data Reviewed: I have personally reviewed following labs and imaging studies Basic Metabolic Panel: Recent Labs  Lab 07/19/19 1231  07/19/19 2311 07/20/19 0633  NA 136 134* 134*  K 3.7 3.7 3.6  CL 105 105 104  CO2 21* 22 22  GLUCOSE 169* 182* 117*  BUN 12 10 11   CREATININE 0.78 0.75 0.66  CALCIUM 9.2 8.6* 8.5*   Liver Function Tests: Recent Labs  Lab 07/19/19 1231 07/20/19 0633  AST 70* 47*  ALT 48* 39  ALKPHOS 117 84  BILITOT 2.1* 2.6*  PROT 6.4*  5.3*  ALBUMIN 3.7 3.0*   Recent Labs  Lab 07/19/19 1237  LIPASE 24   No results for input(s): AMMONIA in the last 168 hours. Coagulation Profile: No results for input(s): INR, PROTIME in the last 168 hours. CBC: Recent Labs  Lab 07/19/19 1231 07/19/19 2312  WBC 7.6 8.1  NEUTROABS 6.7  --   HGB 12.0* 10.6*  HCT 40.0 34.8*  MCV 84.4 82.9  PLT 92* 78*   Cardiac Enzymes: No results for input(s): CKTOTAL, CKMB, CKMBINDEX, TROPONINI in the last 168 hours. BNP: Invalid input(s): POCBNP CBG: Recent Labs  Lab 07/19/19 2043 07/19/19 2132 07/20/19 0838  GLUCAP 236* 194* 105*   HbA1C: No results for input(s): HGBA1C in the last 72 hours. Urine analysis:    Component Value Date/Time   COLORURINE YELLOW 07/19/2019 1553   APPEARANCEUR HAZY (A) 07/19/2019 1553   LABSPEC 1.036 (H) 07/19/2019 1553   PHURINE 5.0 07/19/2019 1553   GLUCOSEU NEGATIVE 07/19/2019 1553   HGBUR SMALL (A) 07/19/2019 1553   BILIRUBINUR NEGATIVE 07/19/2019 1553   KETONESUR NEGATIVE 07/19/2019 1553   PROTEINUR NEGATIVE 07/19/2019 1553   NITRITE NEGATIVE 07/19/2019 1553   LEUKOCYTESUR TRACE (A) 07/19/2019 1553   Sepsis Labs: @LABRCNTIP (procalcitonin:4,lacticidven:4) ) Recent Results (from the past 240 hour(s))  SARS Coronavirus 2 [facility] order, Performed in [facility] hospital lab) Nasopharyngeal Nasopharyngeal Swab     Status: None   Collection Time: 07/19/19 12:32 PM   Specimen: Nasopharyngeal Swab  Result Value Ref Range Status   SARS Coronavirus 2 NEGATIVE NEGATIVE Final    Comment: (NOTE) If result is NEGATIVE SARS-CoV-2 target nucleic acids are NOT DETECTED. The SARS-CoV-2 RNA is generally detectable in upper and lower  respiratory specimens during the acute phase of infection. The lowest  concentration of SARS-CoV-2 viral copies this assay can detect is 250  copies / mL. A negative result does not preclude SARS-CoV-2 infection  and should not be used as the sole basis for treatment or  other  patient management decisions.  A negative result may occur with  improper specimen collection / handling, submission of specimen other  than nasopharyngeal swab, presence of viral mutation(s) within the  areas targeted by this assay, and inadequate number of viral copies  (<250 copies / mL). A negative result must be combined with clinical  observations, patient history, and epidemiological information. If result is POSITIVE SARS-CoV-2 target nucleic acids are DETECTED. The SARS-CoV-2 RNA is generally detectable in upper and lower  respiratory specimens dur ing the acute phase of infection.  Positive  results are indicative of active infection with SARS-CoV-2.  Clinical  correlation with patient history and other diagnostic information is  necessary to determine patient infection status.  Positive results do  not rule out bacterial infection or co-infection with other viruses. If result is PRESUMPTIVE POSTIVE SARS-CoV-2 nucleic acids MAY BE PRESENT.   A presumptive positive result was obtained on the submitted specimen  and confirmed on repeat testing.  While 2019 novel coronavirus  (SARS-CoV-2) nucleic acids may be present in the submitted sample  additional confirmatory testing may be necessary for epidemiological  and / or clinical management purposes  to differentiate between  SARS-CoV-2 and other Sarbecovirus currently known to infect humans.  If clinically indicated additional testing with an alternate test  methodology [phone]) is advised. The SARS-CoV-2 RNA is generally  detectable in upper and lower respiratory sp ecimens during the acute  phase of infection. The expected result is Negative. Fact Sheet for Patients:  StrictlyIdeas.no Fact Sheet for Healthcare Providers: BankingDealers.co.za This test is not yet approved or cleared by the [place] FDA and has been authorized for detection and/or diagnosis of  SARS-CoV-2 by FDA under an Emergency Use Authorization (EUA).  This EUA will remain in effect (meaning this test can be used) for the duration of the COVID-19 declaration under Section 564(b)(1) of the Act, 21 U.S.C. section 360bbb-3(b)(1), unless the authorization is terminated or revoked sooner. Performed at [facility], [address] [place], [place] [address]   Culture, body fluid-bottle     Status: None (Preliminary result)   Collection Time: 07/19/19  4:18 PM   Specimen: Pleura  Result Value Ref Range Status   Specimen Description PLEURAL BOTTLES DRAWN AEROBIC AND ANAEROBIC  Final   Special Requests 10CC  Final   Gram Stain   Final    GRAM POSITIVE COCCI Gram Stain Report Called to,Read Back By and Verified With: A. ROGERS @0907  08/03/2020KAY    Culture   Final    NO GROWTH < 24 HOURS Performed at [facility], [address], [place], [place] [address]    Report Status PENDING  Incomplete  Gram stain     Status: None   Collection Time: 07/19/19  4:18 PM   Specimen: Pleura  Result Value Ref Range Status   Specimen Description PLEURAL  Final   Special Requests NONE  Final   Gram Stain   Final    CYTOSPIN SMEAR NO ORGANISMS SEEN WBC PRESENT,BOTH PMN AND MONONUCLEAR Performed at [facility], [address], [place], [place] [address]    Report Status 07/19/2019 FINAL  Final     Scheduled Meds:  enoxaparin (LOVENOX) injection  40 mg Subcutaneous Q24H   furosemide  80 mg Oral Daily   insulin aspart  0-15 Units Subcutaneous TID WC   insulin aspart  0-5 Units Subcutaneous QHS   insulin glargine  10 Units Subcutaneous QHS   pantoprazole  40 mg Oral Daily   spironolactone  200 mg Oral Daily   Continuous Infusions:  cefTRIAXone (ROCEPHIN)  IV     vancomycin     Followed by   vancomycin      Procedures/Studies: Dg Chest 2 View  Result Date: 07/19/2019 CLINICAL DATA:  Post right thoracentesis in ER, did inspiration and expiration images EXAM: CHEST - 2  VIEW COMPARISON:  Current CTA of the chest. FINDINGS: There is still significant opacity extending from the mid lung to the lung base obscuring the right heart border and right hemidiaphragm. This is consistent with residual fluid and atelectasis. No pneumothorax. Minimal left effusion, stable. Mild left lung base atelectasis. Left lung otherwise clear. IMPRESSION: 1. No pneumothorax or evidence of a complication following thoracentesis. 2. There is still significant opacity on the right consistent with residual pleural fluid and atelectasis. Electronically Signed   By: Lajean Manes M.D.   On: 07/19/2019 16:57   Ct Angio Chest Pe W And/or Wo Contrast  Result Date: 07/19/2019 CLINICAL DATA:  Patient c/o shortness of breath and mid back pain that started  today while out shopping. Denies any chest pain. Per patient took 174m of tramadol and used ice pack, 2 hours prior to arriving to ED, with no relief. Patient reports trying to get appointment x1 month for paracentesis. Ascites noted.Hx of DM, cirrhosis. D-dimer .732EXAM: CT ANGIOGRAPHY CHEST WITH CONTRAST TECHNIQUE: Multidetector CT imaging of the chest was performed using the standard protocol during bolus administration of intravenous contrast. Multiplanar CT image reconstructions and MIPs were obtained to evaluate the vascular anatomy. CONTRAST:  1058mOMNIPAQUE IOHEXOL 350 MG/ML SOLN COMPARISON:  Chest radiograph dated 10/07/2018. Abdomen and pelvis CT, 10/07/2018. FINDINGS: Cardiovascular: There is satisfactory opacification of the central pulmonary arteries. The segmental and smaller branches are less well-defined due to some respiratory motion and some dispersion of the contrast bolus. Allowing for this, there is no evidence of a pulmonary embolism. Heart is normal in size. No pericardial effusion. No significant coronary artery calcifications. Great vessels are normal in caliber. No aortic dissection. Minimal aortic atherosclerosis. Mediastinum/Nodes:  Numerous subcentimeter neck base and mediastinal lymph nodes. 9 are enlarged by size criteria. Visualized thyroid is unremarkable. Trachea is patent. 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 Lungs/Pleura: Large right pleural effusion occupying most of the right hemithorax. Right lower lobe is completely collapsed. There is partial atelectasis of the right middle lobe and mild dependent atelectasis in the right upper lobe. Minimal left pleural effusion. No convincing centrally obstructing mass or lesion. Small cavitary nodule, subpleural left anterior upper lobe, image 46, series 10, 7 mm. 7 mm nodule, subpleural posteromedial left lower lobe, image 111, series 10. Probable additional nodule, left lower lobe, image 115, series 10, 6 mm. No evidence of pneumonia or pulmonary edema. No pneumothorax. Minor dependent atelectasis in the posterior inferior left lower lobe. Upper Abdomen: Changes of advanced cirrhosis with a nodular liver, extensive venous collaterals and splenomegaly as well as a small amount of ascites. There are prominent and enlarged Peri celiac and gastrohepatic ligament lymph nodes as well as retroperitoneal lymph nodes. Adenopathy and ascites have increased compared to the prior CT. Musculoskeletal: No fracture or acute finding. No osteoblastic or osteolytic lesions. Review of the MIP images confirms the above findings. IMPRESSION: 1. Large right-sided pleural effusion with complete collapse of the right lower lobe and partial atelectasis of the right middle lobe and mild dependent atelectasis in the right upper lobe. 2. Minimal left pleural effusion. 3. Three left lung nodules. Two in the left lower lobe would be in the included field of view from the prior abdomen and pelvis CT, with a are not visualized. Metastatic/neoplastic disease is possible. 4. Multiple subcentimeter neck base and mediastinal lymph nodes, more numerous than typically  seen, but nonenlarged. There are prominent and enlarged Peri celiac, gastrohepatic ligament and retroperitoneal lymph nodes increased in size and number from the prior CT. 5. Changes of advanced cirrhosis with portal venous hypertension, the latter reflected by extensive varices, splenomegaly and ascites. No defined liver mass visualized, although this would be best assessed with liver MRI with and without contrast. Aortic Atherosclerosis (ICD10-I70.0). Electronically Signed   By: DaLajean Manes.D.   On: 07/19/2019 15:12    DaOrson EvaDO  [facility] Pager [phone] 7PM-7AM, please contact night-coverage [email or web address] Password [email or web address] 07/20/2019, 10:42 AM   LOS: 0 days

## 2019-07-20 NOTE — Progress Notes (Signed)
CRITICAL VALUE ALERT  Critical Value:  Fluid culture/gram stain gram positive cocci per lab notification this am  Date & Time Notied:  07/20/2019 0908  Provider Notified: Text-paged Dr. Carles Collet  Orders Received/Actions taken:

## 2019-07-20 NOTE — Progress Notes (Signed)
Pt transferred to [id number]-[id number] via carelink from [facility]. Pt given CHG bath. Tele applied, CCMD notified. VSS. Will continue to monitor. Amanda Cockayne, RN

## 2019-07-20 NOTE — Progress Notes (Signed)
Pharmacy Antibiotic Note  Lawrence Rogers is a 58 y.o. male admitted on 07/19/2019 with pneumonia/lung empyema  Pharmacy has been consulted for Vancomycin dosing.  Plan: Vancomycin 2000 mg IV x 1 dose. Vancomycin 1000 mg IV every 8 hours.  Goal trough 15-20 mcg/mL.  Ceftriaxone 2000 mg IV every 24 hours. Monitor labs, c/s, and vanco levels as indicated.  Height: 6' 1"  (185.4 cm) Weight: 229 lb 4.5 oz (104 kg) IBW/kg (Calculated) : 79.9  Temp (24hrs), Avg:98.6 F (37 C), Min:98.1 F (36.7 C), Max:99.6 F (37.6 C)  Recent Labs  Lab 07/19/19 1231 07/19/19 2311 07/19/19 2312 07/20/19 0633  WBC 7.6  --  8.1  --   CREATININE 0.78 0.75  --  0.66    Estimated Creatinine Clearance: 129 mL/min (by C-G formula based on SCr of 0.66 mg/dL).    No Known Allergies  Antimicrobials this admission: CTX 8/3 >>  Vanco 8/3 >>   Dose adjustments this admission: N/A  Microbiology results: 8/2 Plueral fluid: gram + cocci   Thank you for allowing pharmacy to be a part of this patient's care.  Ramond Craver 07/20/2019 11:07 AM

## 2019-07-21 ENCOUNTER — Inpatient Hospital Stay (HOSPITAL_COMMUNITY): Payer: BC Managed Care – PPO

## 2019-07-21 ENCOUNTER — Encounter (HOSPITAL_COMMUNITY): Payer: Self-pay | Admitting: Anesthesiology

## 2019-07-21 ENCOUNTER — Telehealth (INDEPENDENT_AMBULATORY_CARE_PROVIDER_SITE_OTHER): Payer: Self-pay | Admitting: Nurse Practitioner

## 2019-07-21 DIAGNOSIS — J869 Pyothorax without fistula: Secondary | ICD-10-CM

## 2019-07-21 LAB — BASIC METABOLIC PANEL
Anion gap: 10 (ref 5–15)
BUN: 7 mg/dL (ref 6–20)
CO2: 25 mmol/L (ref 22–32)
Calcium: 8.7 mg/dL — ABNORMAL LOW (ref 8.9–10.3)
Chloride: 99 mmol/L (ref 98–111)
Creatinine, Ser: 0.75 mg/dL (ref 0.61–1.24)
GFR calc Af Amer: >60 mL/min (ref >60)
GFR calc non Af Amer: >60 mL/min (ref >60)
Glucose, Bld: 137 mg/dL — ABNORMAL HIGH (ref 70–99)
Potassium: 3.4 mmol/L — ABNORMAL LOW (ref 3.5–5.1)
Sodium: 134 mmol/L — ABNORMAL LOW (ref 135–145)

## 2019-07-21 LAB — HEPATIC FUNCTION PANEL
ALT: 36 U/L (ref 0–44)
AST: 38 U/L (ref 15–41)
Albumin: 2.8 g/dL — ABNORMAL LOW (ref 3.5–5.0)
Alkaline Phosphatase: 91 U/L (ref 38–126)
Bilirubin, Direct: 0.5 mg/dL — ABNORMAL HIGH (ref 0.0–0.2)
Indirect Bilirubin: 1.1 mg/dL — ABNORMAL HIGH (ref 0.3–0.9)
Total Bilirubin: 1.6 mg/dL — ABNORMAL HIGH (ref 0.3–1.2)
Total Protein: 5.5 g/dL — ABNORMAL LOW (ref 6.5–8.1)

## 2019-07-21 LAB — GLUCOSE, CAPILLARY
Glucose-Capillary: 120 mg/dL — ABNORMAL HIGH (ref 70–99)
Glucose-Capillary: 131 mg/dL — ABNORMAL HIGH (ref 70–99)
Glucose-Capillary: 160 mg/dL — ABNORMAL HIGH (ref 70–99)
Glucose-Capillary: 124 mg/dL — ABNORMAL HIGH (ref 70–99)

## 2019-07-21 LAB — CBC
HCT: 35.7 % — ABNORMAL LOW (ref 39.0–52.0)
Hemoglobin: 11.1 g/dL — ABNORMAL LOW (ref 13.0–17.0)
MCH: 25.4 pg — ABNORMAL LOW (ref 26.0–34.0)
MCHC: 31.1 g/dL (ref 30.0–36.0)
MCV: 81.7 fL (ref 80.0–100.0)
Platelets: 87 10*3/uL — ABNORMAL LOW (ref 150–400)
RBC: 4.37 MIL/uL (ref 4.22–5.81)
RDW: 17.2 % — ABNORMAL HIGH (ref 11.5–15.5)
WBC: 5.7 10*3/uL (ref 4.0–10.5)
nRBC: 0 % (ref 0.0–0.2)

## 2019-07-21 LAB — TYPE AND SCREEN
ABO/RH(D): O POS
Antibody Screen: NEGATIVE

## 2019-07-21 LAB — HIV ANTIBODY (ROUTINE TESTING W REFLEX): HIV Screen 4th Generation wRfx: NONREACTIVE

## 2019-07-21 LAB — ABO/RH: ABO/RH(D): O POS

## 2019-07-21 LAB — PROTIME-INR
INR: 1.2 (ref 0.8–1.2)
Prothrombin Time: 15.2 seconds (ref 11.4–15.2)

## 2019-07-21 LAB — HEMOGLOBIN A1C
Hgb A1c MFr Bld: 7 % — ABNORMAL HIGH (ref 4.8–5.6)
Mean Plasma Glucose: 154.2 mg/dL

## 2019-07-21 MED ORDER — POTASSIUM CHLORIDE CRYS ER 20 MEQ PO TBCR
40.0000 meq | EXTENDED_RELEASE_TABLET | Freq: Once | ORAL | Status: AC
  Administered 2019-07-21: 16:00:00 40 meq via ORAL
  Filled 2019-07-21: qty 2

## 2019-07-21 NOTE — Consult Note (Signed)
[address]       [place] [address]             [phone]                    Gaetan R Jorgenson [place] Medical Record #[id number] Date of Birth: Aug 10, 1961  Referring: No ref. provider found Primary Care: [name], MD Primary Cardiologist: No primary care provider on file.  Chief Complaint:    Chief Complaint  Patient presents with  . Shortness of Breath    History of Present Illness:    Lawrence Rogers 58 y.o. male is admitted with acute onset dyspnea.  Presented to the ED on 8/2, and was noted to have a large volume right pleural effusion, and underwent that thoracentesis with return on 1L of straw-colored fluid.  Gram positive Cocci have grown in the cultures.  He was diagnosed with cirrhosis, and underwent 3 paracentesis last year.    Outside of the dyspnea, and mild right sided chest pain, he has no other complaints.     Past Medical History:  Diagnosis Date  . Arthritis   . Cirrhosis ([place])   . Depression   . Diabetes [facility])     Past Surgical History:  Procedure Laterality Date  . ESOPHAGEAL BANDING N/A 08/29/2018   Procedure: ESOPHAGEAL BANDING;  Surgeon: Rogene Houston, MD;  Location: [facility]O [facility];  Service: Endoscopy;  Laterality: N/A;  . ESOPHAGEAL BANDING  10/24/2018   Procedure: ESOPHAGEAL BANDING;  Surgeon: Rogene Houston, MD;  Location: [facility] ENDO [facility];  Service: Endoscopy;;  3 bands  . ESOPHAGOGASTRODUODENOSCOPY (EGD) WITH PROPOFOL N/A 08/29/2018   Procedure: ESOPHAGOGASTRODUODENOSCOPY (EGD) WITH PROPOFOL;  Surgeon: Rogene Houston, MD;  Location: [facility];  Service: Endoscopy;  Laterality: N/A;  9:30  . ESOPHAGOGASTRODUODENOSCOPY (EGD) WITH PROPOFOL N/A 10/24/2018   Procedure: ESOPHAGOGASTRODUODENOSCOPY (EGD) WITH PROPOFOL;  Surgeon: Rogene Houston, MD;  Location: [facility] ENDO [facility];  Service: Endoscopy;  Laterality: N/A;  7:30  . IR PARACENTESIS  04/18/2018  . IR TRANSCATHETER BX  04/18/2018  . IR US GUIDE VASC ACCESS  RIGHT  04/18/2018  . IR VENOGRAM HEPATIC W HEMODYNAMIC EVALUATION  04/18/2018    No family history on file.   Social History   Tobacco Use  Smoking Status Former Smoker  . Types: Cigarettes  Smokeless Tobacco Never Used    Social History   Substance and Sexual Activity  Alcohol Use Never  . Frequency: Never   Comment: Has not drank in 30 yrs.      No Known Allergies  Current Facility-Administered Medications  Medication Dose Route Frequency Provider Last Rate Last Dose  . 0.9 %  sodium chloride infusion   Intravenous PRN Orson Eva, MD   Stopped at 07/20/19 1204  . cefTRIAXone (ROCEPHIN) 2 g in sodium chloride 0.9 % 100 mL IVPB  2 g Intravenous Q24H Orson Eva, MD 200 mL/hr at 07/21/19 0936 2 g at 07/21/19 0936  . enoxaparin (LOVENOX) injection 40 mg  40 mg Subcutaneous Q24H Tat, David, MD   40 mg at 07/20/19 2211  . furosemide (LASIX) tablet 80 mg  80 mg Oral Daily Tat, Shanon Brow, MD   80 mg at 07/21/19 0841  . insulin aspart (novoLOG) injection 0-15 Units  0-15 Units Subcutaneous TID WC Orson Eva, MD   2 Units at 07/21/19 1207  . insulin aspart (novoLOG) injection 0-5 Units  0-5 Units Subcutaneous QHS Orson Eva, MD  2 Units at 07/19/19 2056  . insulin glargine (LANTUS) injection 10 Units  10 Units Subcutaneous Benay Pike, MD   10 Units at 07/19/19 2056  . methocarbamol (ROBAXIN) tablet 500 mg  500 mg Oral Q8H PRN Orson Eva, MD   500 mg at 07/19/19 2036  . ondansetron (ZOFRAN) injection 4 mg  4 mg Intravenous Q6H PRN Tat, David, MD      . pantoprazole (PROTONIX) EC tablet 40 mg  40 mg Oral Daily Tat, David, MD   40 mg at 07/21/19 0842  . spironolactone (ALDACTONE) tablet 200 mg  200 mg Oral Daily Tat, David, MD   200 mg at 07/21/19 0841  . traMADol (ULTRAM) tablet 100 mg  100 mg Oral Q6H PRN Orson Eva, MD   100 mg at 07/20/19 2017  . vancomycin (VANCOCIN) IVPB 1000 mg/200 mL premix  1,000 mg Intravenous Franco Collet, MD 200 mL/hr at 07/21/19 1210 1,000 mg at 07/21/19 1210      Review of Systems:  Constitutional: no fevers, chills.  Weight stable Cardiovascular: denies any chest pain Pulmonary: dyspnea GI: negative MSK: negative Neuro: negative  PHYSICAL EXAMINATION: BP 93/68 (BP Location: Right Arm)   Pulse 71   Temp 98.4 F (36.9 C) (Oral)   Resp 18   Ht 6' 1"  (1.854 m)   Wt 104 kg   SpO2 97%   BMI 30.25 kg/m  General: non-toxic, appears stated age.   HEENT: NCAT.  Moist mucous membranes, Oropharynx clear.   Neuro: Alert, Oriented X 3. Non focal. Psych: appropriate mood. Cardiovascular: Sinus regular rate, no murmurs Pulmonary:  Decreased on the right.   GI: distended.  Fluid wave present MSK: Chest wall normal.   Diagnostic Studies & Laboratory data:     Recent Radiology Findings:  CT Chest: large right pleural effusion with collapse of the RLL  CXR: large to moderate pleural effusion    I have independently reviewed the above radiology studies  and reviewed the findings with the patient.   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  His serum/pleural albumin ration is 2.8, and the pleural fluid total protein is < 3.  Given the elevated WBC and gram + cocci, he will require surgical drainage.  He will need aggressive medical management or his cirrhosis as well.    Will plan for R VATS, Decortication on 07/22/19.      Lajuana Matte 07/21/2019 1:41  PM

## 2019-07-21 NOTE — Anesthesia Preprocedure Evaluation (Addendum)
Anesthesia Evaluation  Patient identified by MRN, date of birth, ID band Patient awake    Reviewed: Allergy & Precautions, NPO status , Patient's Chart, lab work & pertinent test results  Airway Mallampati: I  TM Distance: >3 FB Neck ROM: Full    Dental  (+) Edentulous Upper, Edentulous Lower   Pulmonary former smoker,     + decreased breath sounds      Cardiovascular negative cardio ROS   Rhythm:Regular Rate:Normal     Neuro/Psych Depression    GI/Hepatic PUD, GERD  Medicated,(+) Cirrhosis   Esophageal Varices    , Hepatitis -  Endo/Other  diabetes, Type 2, Oral Hypoglycemic Agents  Renal/GU negative Renal ROS     Musculoskeletal  (+) Arthritis ,   Abdominal Normal abdominal exam  (+)   Peds  Hematology negative hematology ROS (+)   Anesthesia Other Findings   Reproductive/Obstetrics                            Anesthesia Physical Anesthesia Plan  ASA: III  Anesthesia Plan: General   Post-op Pain Management:    Induction: Intravenous  PONV Risk Score and Plan: 3 and Ondansetron, Dexamethasone and Midazolam  Airway Management Planned: Double Lumen EBT  Additional Equipment: Arterial line  Intra-op Plan:   Post-operative Plan: Extubation in OR  Informed Consent: I have reviewed the patients History and Physical, chart, labs and discussed the procedure including the risks, benefits and alternatives for the proposed anesthesia with the patient or authorized representative who has indicated his/her understanding and acceptance.       Plan Discussed with: CRNA  Anesthesia Plan Comments:        Anesthesia Quick Evaluation

## 2019-07-21 NOTE — Progress Notes (Signed)
PROGRESS NOTE    Lawrence Rogers  RFF:[id number] DOB: 1961/09/05 DOA: 07/19/2019 PCP: [name], MD    Brief Narrative:   58 year old male with a history of NASH Cirrhosis, diabetes mellitus type 2, and GERD presenting with 1 month history of shortness of breath and dyspnea on exertion.  He stated that his breathing and dyspnea on exertion got significantly worse in the 24 hours prior to admission.  In addition, he began developing right-sided pleuritic chest pain.  He had denied any fevers, chills, nausea, vomiting, diarrhea, abdominal pain, headache, coughing, hemoptysis.  In the emergency department, the patient had a low-grade temperature of 99.6 F but remained hemodynamically stable.  He required supplemental oxygen at 3 L for his hypoxia.  WBC was 7.6 at the time of admission.  CT angiogram of the chest showed a large right-sided pleural effusion with complete collapse of the right lower lobe.  There is also some scattered left-sided lung nodules.  There was no pulmonary embolus.  Thoracocentesis was performed in the emergency department draining 1 L of fluid. Pleural fluid studies show an exudate with WBC 22,531.  Preliminary Gram stain shows gram-positive cocci.  The patient was started on vancomycin and ceftriaxone.    Transferred to [facility] on 07/20/2019 for thoracic surgery consultation for empyema and possible decortication.   Assessment & Plan:   Principal Problem:   Acute respiratory distress Active Problems:   Diabetes ([place])   Cirrhosis of liver with ascites (HCC)   Pleural effusion   Empyema lung (HCC)   Acute respiratory failure with hypoxia (HCC)   Empyema (HCC)  Acute hypoxic respiratory failure Right large pleural effusion with empyema Patient presenting with progressive shortness of breath and right-sided pleuritic chest pain.  CT angiogram chest notable for a large right-sided pleural effusion with complete collapse of the right lower lobe; negative for  pulmonary embolism.  Patient underwent thoracentesis by EDP at any [facility] with removal of 1 L fluid with pleural fluid studies notable for WBC count of 22,531 and Gram stain showing gram-positive cocci.  Effusion consistent with exudate/empyema. --Cardiothoracic surgery consulted, appreciate assistance --Continue antibiotics with vancomycin and ceftriaxone --Pleural Fluid culture shows no growth x2 days --Continue supplemental oxygen, wean to maintain SPO2 greater than 92% --Further dependent on CTS evaluation  Hx NASH cirrhosis with ascites --Continue furosemide 80 mg p.o. daily --continue spironolactone 200 mg p.o. daily --Monitor strict I's and O's and daily weights  Type 2 diabetes mellitus Hemoglobin A1c 7.0 on 07/19/2019.  Home regimen includes glipizide 5 mg p.o. daily, metformin 1000 mg p.o. twice daily. --Hold oral hypoglycemics while inpatient --Lantus 10 units subcutaneously daily --Insulin sliding scale for further coverage --Monitor CBGs qAC/HS  GERD: Continue PPI    DVT prophylaxis: Lovenox Code Status: Full code Family Communication: None Disposition Plan: Continue inpatient hospitalization, further depend on clinical course, anticipate discharge home when medically ready   Consultants:   Cardiothoracic surgery  Procedures:   Thoracentesis by [facility] EDP 8/2 w/ 1L removed  Antimicrobials:   Vancomycin 8/2>>  Ceftriaxone 8/2>>   Subjective: Seen and examined at bedside, resting comfortably.  Continues with mild/moderate dyspnea, slightly improved following thoracentesis.  No other complaints at this time.  Denies headache, no fever/chills/night sweats, no nausea vomiting/diarrhea, no chest pain, no palpitations, no abdominal pain, no weakness, no issues with bowel/bladder function, no paresthesias.  No acute events overnight per nursing staff.  Objective: Vitals:   07/20/19 1556 07/20/19 1857 07/21/19 0414 07/21/19 1107  BP:  113/63 110/64 91/63  93/68  Pulse: 83 91 71 71  Resp: 18  19 18   Temp: 99.6 F (37.6 C) 99 F (37.2 C) 98.1 F (36.7 C) 98.4 F (36.9 C)  TempSrc: Oral Oral Oral Oral  SpO2: 95% 96% 95% 97%  Weight:      Height:        Intake/Output Summary (Last 24 hours) at 07/21/2019 1434 Last data filed at 07/21/2019 1211 Gross per 24 hour  Intake 935 ml  Output 1090 ml  Net -155 ml   Filed Weights   07/19/19 1211 07/19/19 2017  Weight: 99.8 kg 104 kg    Examination:  General exam: Appears calm and comfortable  Respiratory system: Breath sounds decreased bilateral bases, right greater than left, normal respiratory effort, no crackles/wheezing, on 3 L nasal cannula, SPO2 95%. Cardiovascular system: S1 & S2 heard, RRR. No JVD, murmurs, rubs, gallops or clicks.  2+ pitting edema bilateral lower extremities to mid shin Gastrointestinal system: Abdomen is nondistended, soft and nontender. No organomegaly or masses felt. Normal bowel sounds heard. Central nervous system: Alert and oriented. No focal neurological deficits. Extremities: Symmetric 5 x 5 power.  2+ pitting edema bilateral lower extremities to mid shin Skin: No rashes, lesions or ulcers Psychiatry: Judgement and insight appear normal. Mood & affect appropriate.     Data Reviewed: I have personally reviewed following labs and imaging studies  CBC: Recent Labs  Lab 07/19/19 1231 07/19/19 2312 07/21/19 1149  WBC 7.6 8.1 5.7  NEUTROABS 6.7  --   --   HGB 12.0* 10.6* 11.1*  HCT 40.0 34.8* 35.7*  MCV 84.4 82.9 81.7  PLT 92* 78* 87*   Basic Metabolic Panel: Recent Labs  Lab 07/19/19 1231 07/19/19 2311 07/20/19 0633 07/21/19 1149  NA 136 134* 134* 134*  K 3.7 3.7 3.6 3.4*  CL 105 105 104 99  CO2 21* 22 22 25   GLUCOSE 169* 182* 117* 137*  BUN 12 10 11 7   CREATININE 0.78 0.75 0.66 0.75  CALCIUM 9.2 8.6* 8.5* 8.7*   GFR: Estimated Creatinine Clearance: 129 mL/min (by C-G formula based on SCr of 0.75 mg/dL). Liver Function Tests: Recent  Labs  Lab 07/19/19 1231 07/20/19 0633 07/21/19 1149  AST 70* 47* 38  ALT 48* 39 36  ALKPHOS 117 84 91  BILITOT 2.1* 2.6* 1.6*  PROT 6.4* 5.3* 5.5*  ALBUMIN 3.7 3.0* 2.8*   Recent Labs  Lab 07/19/19 1237  LIPASE 24   No results for input(s): AMMONIA in the last 168 hours. Coagulation Profile: No results for input(s): INR, PROTIME in the last 168 hours. Cardiac Enzymes: No results for input(s): CKTOTAL, CKMB, CKMBINDEX, TROPONINI in the last 168 hours. BNP (last 3 results) No results for input(s): PROBNP in the last 8760 hours. HbA1C: Recent Labs    07/19/19 2311 07/21/19 1149  HGBA1C 7.0* 7.0*   CBG: Recent Labs  Lab 07/20/19 1200 07/20/19 1630 07/20/19 2210 07/21/19 0631 07/21/19 1106  GLUCAP 133* 108* 143* 120* 131*   Lipid Profile: No results for input(s): CHOL, HDL, LDLCALC, TRIG, CHOLHDL, LDLDIRECT in the last 72 hours. Thyroid Function Tests: No results for input(s): TSH, T4TOTAL, FREET4, T3FREE, THYROIDAB in the last 72 hours. Anemia Panel: No results for input(s): VITAMINB12, FOLATE, FERRITIN, TIBC, IRON, RETICCTPCT in the last 72 hours. Sepsis Labs: No results for input(s): PROCALCITON, LATICACIDVEN in the last 168 hours.  Recent Results (from the past 240 hour(s))  SARS Coronavirus 2 [facility] order, Performed in [facility]  hospital lab) Nasopharyngeal Nasopharyngeal Swab     Status: None   Collection Time: 07/19/19 12:32 PM   Specimen: Nasopharyngeal Swab  Result Value Ref Range Status   SARS Coronavirus 2 NEGATIVE NEGATIVE Final    Comment: (NOTE) If result is NEGATIVE SARS-CoV-2 target nucleic acids are NOT DETECTED. The SARS-CoV-2 RNA is generally detectable in upper and lower  respiratory specimens during the acute phase of infection. The lowest  concentration of SARS-CoV-2 viral copies this assay can detect is 250  copies / mL. A negative result does not preclude SARS-CoV-2 infection  and should not be used as the sole basis for treatment  or other  patient management decisions.  A negative result may occur with  improper specimen collection / handling, submission of specimen other  than nasopharyngeal swab, presence of viral mutation(s) within the  areas targeted by this assay, and inadequate number of viral copies  (<250 copies / mL). A negative result must be combined with clinical  observations, patient history, and epidemiological information. If result is POSITIVE SARS-CoV-2 target nucleic acids are DETECTED. The SARS-CoV-2 RNA is generally detectable in upper and lower  respiratory specimens dur ing the acute phase of infection.  Positive  results are indicative of active infection with SARS-CoV-2.  Clinical  correlation with patient history and other diagnostic information is  necessary to determine patient infection status.  Positive results do  not rule out bacterial infection or co-infection with other viruses. If result is PRESUMPTIVE POSTIVE SARS-CoV-2 nucleic acids MAY BE PRESENT.   A presumptive positive result was obtained on the submitted specimen  and confirmed on repeat testing.  While 2019 novel coronavirus  (SARS-CoV-2) nucleic acids may be present in the submitted sample  additional confirmatory testing may be necessary for epidemiological  and / or clinical management purposes  to differentiate between  SARS-CoV-2 and other Sarbecovirus currently known to infect humans.  If clinically indicated additional testing with an alternate test  methodology [phone]) is advised. The SARS-CoV-2 RNA is generally  detectable in upper and lower respiratory sp ecimens during the acute  phase of infection. The expected result is Negative. Fact Sheet for Patients:  StrictlyIdeas.no Fact Sheet for Healthcare Providers: BankingDealers.co.za This test is not yet approved or cleared by the [place] FDA and has been authorized for detection and/or diagnosis of  SARS-CoV-2 by FDA under an Emergency Use Authorization (EUA).  This EUA will remain in effect (meaning this test can be used) for the duration of the COVID-19 declaration under Section 564(b)(1) of the Act, 21 U.S.C. section 360bbb-3(b)(1), unless the authorization is terminated or revoked sooner. Performed at [facility], [address] [place], [place] [address]   Culture, body fluid-bottle     Status: None (Preliminary result)   Collection Time: 07/19/19  4:18 PM   Specimen: Pleura  Result Value Ref Range Status   Specimen Description PLEURAL BOTTLES DRAWN AEROBIC AND ANAEROBIC  Final   Special Requests 10CC  Final   Gram Stain   Final    GRAM POSITIVE COCCI Gram Stain Report Called to,Read Back By and Verified With: A. ROGERS @[id number]  08/03/2020KAY AEROBIC AND ANAEROBIC BOTTLES    Culture   Final    NO GROWTH 2 DAYS Performed at [facility], [address], [place], [place] [address]    Report Status PENDING  Incomplete  Gram stain     Status: None   Collection Time: 07/19/19  4:18 PM   Specimen: Pleura  Result Value Ref Range  Status   Specimen Description PLEURAL  Final   Special Requests NONE  Final   Gram Stain   Final    CYTOSPIN SMEAR NO ORGANISMS SEEN WBC PRESENT,BOTH PMN AND MONONUCLEAR Performed at [facility], [address], [place], [place] [address]    Report Status 07/19/2019 FINAL  Final         Radiology Studies: Dg Chest 2 View  Result Date: 07/19/2019 CLINICAL DATA:  Post right thoracentesis in ER, did inspiration and expiration images EXAM: CHEST - 2 VIEW COMPARISON:  Current CTA of the chest. FINDINGS: There is still significant opacity extending from the mid lung to the lung base obscuring the right heart border and right hemidiaphragm. This is consistent with residual fluid and atelectasis. No pneumothorax. Minimal left effusion, stable. Mild left lung base atelectasis. Left lung otherwise clear. IMPRESSION: 1. No pneumothorax or evidence of a  complication following thoracentesis. 2. There is still significant opacity on the right consistent with residual pleural fluid and atelectasis. Electronically Signed   By: Lajean Manes M.D.   On: 07/19/2019 16:57   Ct Angio Chest Pe W And/or Wo Contrast  Result Date: 07/19/2019 CLINICAL DATA:  Patient c/o shortness of breath and mid back pain that started today while out shopping. Denies any chest pain. Per patient took 164m of tramadol and used ice pack, 2 hours prior to arriving to ED, with no relief. Patient reports trying to get appointment x1 month for paracentesis. Ascites noted.Hx of DM, cirrhosis. D-dimer .748EXAM: CT ANGIOGRAPHY CHEST WITH CONTRAST TECHNIQUE: Multidetector CT imaging of the chest was performed using the standard protocol during bolus administration of intravenous contrast. Multiplanar CT image reconstructions and MIPs were obtained to evaluate the vascular anatomy. CONTRAST:  1045mOMNIPAQUE IOHEXOL 350 MG/ML SOLN COMPARISON:  Chest radiograph dated 10/07/2018. Abdomen and pelvis CT, 10/07/2018. FINDINGS: Cardiovascular: There is satisfactory opacification of the central pulmonary arteries. The segmental and smaller branches are less well-defined due to some respiratory motion and some dispersion of the contrast bolus. Allowing for this, there is no evidence of a pulmonary embolism. Heart is normal in size. No pericardial effusion. No significant coronary artery calcifications. Great vessels are normal in caliber. No aortic dissection. Minimal aortic atherosclerosis. Mediastinum/Nodes: Numerous subcentimeter neck base and mediastinal lymph nodes. 9 are enlarged by size criteria. Visualized thyroid is unremarkable. Trachea is patent. 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 Lungs/Pleura: Large right pleural effusion occupying most of the right hemithorax. Right lower lobe is completely collapsed. There is partial atelectasis of the  right middle lobe and mild dependent atelectasis in the right upper lobe. Minimal left pleural effusion. No convincing centrally obstructing mass or lesion. Small cavitary nodule, subpleural left anterior upper lobe, image 46, series 10, 7 mm. 7 mm nodule, subpleural posteromedial left lower lobe, image 111, series 10. Probable additional nodule, left lower lobe, image 115, series 10, 6 mm. No evidence of pneumonia or pulmonary edema. No pneumothorax. Minor dependent atelectasis in the posterior inferior left lower lobe. Upper Abdomen: Changes of advanced cirrhosis with a nodular liver, extensive venous collaterals and splenomegaly as well as a small amount of ascites. There are prominent and enlarged Peri celiac and gastrohepatic ligament lymph nodes as well as retroperitoneal lymph nodes. Adenopathy and ascites have increased compared to the prior CT. Musculoskeletal: No fracture or acute finding. No osteoblastic or osteolytic lesions. Review of the MIP images confirms the above findings. IMPRESSION: 1. Large right-sided pleural effusion with complete collapse  of the right lower lobe and partial atelectasis of the right middle lobe and mild dependent atelectasis in the right upper lobe. 2. Minimal left pleural effusion. 3. Three left lung nodules. Two in the left lower lobe would be in the included field of view from the prior abdomen and pelvis CT, with a are not visualized. Metastatic/neoplastic disease is possible. 4. Multiple subcentimeter neck base and mediastinal lymph nodes, more numerous than typically seen, but nonenlarged. There are prominent and enlarged Peri celiac, gastrohepatic ligament and retroperitoneal lymph nodes increased in size and number from the prior CT. 5. Changes of advanced cirrhosis with portal venous hypertension, the latter reflected by extensive varices, splenomegaly and ascites. No defined liver mass visualized, although this would be best assessed with liver MRI with and without  contrast. Aortic Atherosclerosis (ICD10-I70.0). Electronically Signed   By: Lajean Manes M.D.   On: 07/19/2019 15:12   Dg Chest Port 1 View  Result Date: 07/21/2019 CLINICAL DATA:  Shortness of breath history of lung surgery EXAM: PORTABLE CHEST 1 VIEW COMPARISON:  CT chest July 19, 2019, radiograph FINDINGS: Again noted is a moderate to large right-sided pleural effusion with hazy opacity in the right lung likely layering pleural effusion. There is a trace blunting of the left costophrenic angle. There is mildly increased pulmonary vasculature seen throughout. Cardiomediastinal silhouette is unchanged from prior exam. No acute osseous findings. IMPRESSION: 1. Moderate-to-large right pleural effusion with probable adjacent layering effusion. 2. Pulmonary vascular congestion Electronically Signed   By: Prudencio Pair M.D.   On: 07/21/2019 08:57        Scheduled Meds:  enoxaparin (LOVENOX) injection  40 mg Subcutaneous Q24H   furosemide  80 mg Oral Daily   insulin aspart  0-15 Units Subcutaneous TID WC   insulin aspart  0-5 Units Subcutaneous QHS   insulin glargine  10 Units Subcutaneous QHS   pantoprazole  40 mg Oral Daily   spironolactone  200 mg Oral Daily   Continuous Infusions:  sodium chloride Stopped (07/20/19 1204)   cefTRIAXone (ROCEPHIN)  IV 2 g (07/21/19 0936)   vancomycin 1,000 mg (07/21/19 1210)     LOS: 1 day    Time spent: 41 minutes spent on chart review, personally reviewing all imaging studies and labs, discussion with nursing staff, consultants, updating family and interview/physical exam; more than 50% of that time was spent in counseling and/or coordination of care.    Jerremy Maione J [place] DO [facility] Pager [phone]  If 7PM-7AM, please contact night-coverage [email or web address] Password [email or web address] 07/21/2019, 2:34 PM

## 2019-07-21 NOTE — Telephone Encounter (Signed)
Patient left message stating he needs a paracentesis scheduled - he is currently a patient at [facility]

## 2019-07-22 ENCOUNTER — Inpatient Hospital Stay (HOSPITAL_COMMUNITY): Payer: BC Managed Care – PPO | Admitting: Certified Registered Nurse Anesthetist

## 2019-07-22 ENCOUNTER — Encounter (HOSPITAL_COMMUNITY)
Admission: EM | Disposition: A | Discharge status: Home / Self Care | Payer: Self-pay | Source: Home / Self Care | Attending: Internal Medicine

## 2019-07-22 ENCOUNTER — Inpatient Hospital Stay (HOSPITAL_COMMUNITY): Payer: BC Managed Care – PPO

## 2019-07-22 ENCOUNTER — Encounter (HOSPITAL_COMMUNITY): Payer: Self-pay | Admitting: Certified Registered Nurse Anesthetist

## 2019-07-22 DIAGNOSIS — J869 Pyothorax without fistula: Secondary | ICD-10-CM

## 2019-07-22 HISTORY — PX: PLEURAL EFFUSION DRAINAGE: SHX5099

## 2019-07-22 HISTORY — PX: VIDEO ASSISTED THORACOSCOPY (VATS)/DECORTICATION: SHX6171

## 2019-07-22 LAB — CBC
HCT: 34.4 % — ABNORMAL LOW (ref 39.0–52.0)
Hemoglobin: 10.7 g/dL — ABNORMAL LOW (ref 13.0–17.0)
MCH: 25.5 pg — ABNORMAL LOW (ref 26.0–34.0)
MCHC: 31.1 g/dL (ref 30.0–36.0)
MCV: 82.1 fL (ref 80.0–100.0)
Platelets: 88 10*3/uL — ABNORMAL LOW (ref 150–400)
RBC: 4.19 MIL/uL — ABNORMAL LOW (ref 4.22–5.81)
RDW: 17.2 % — ABNORMAL HIGH (ref 11.5–15.5)
WBC: 5.2 10*3/uL (ref 4.0–10.5)
nRBC: 0 % (ref 0.0–0.2)

## 2019-07-22 LAB — COMPREHENSIVE METABOLIC PANEL
ALT: 31 U/L (ref 0–44)
AST: 33 U/L (ref 15–41)
Albumin: 2.6 g/dL — ABNORMAL LOW (ref 3.5–5.0)
Alkaline Phosphatase: 84 U/L (ref 38–126)
Anion gap: 9 (ref 5–15)
BUN: 9 mg/dL (ref 6–20)
CO2: 25 mmol/L (ref 22–32)
Calcium: 8.5 mg/dL — ABNORMAL LOW (ref 8.9–10.3)
Chloride: 101 mmol/L (ref 98–111)
Creatinine, Ser: 0.62 mg/dL (ref 0.61–1.24)
GFR calc Af Amer: >60 mL/min (ref >60)
GFR calc non Af Amer: >60 mL/min (ref >60)
Glucose, Bld: 110 mg/dL — ABNORMAL HIGH (ref 70–99)
Potassium: 3.7 mmol/L (ref 3.5–5.1)
Sodium: 135 mmol/L (ref 135–145)
Total Bilirubin: 1.5 mg/dL — ABNORMAL HIGH (ref 0.3–1.2)
Total Protein: 5 g/dL — ABNORMAL LOW (ref 6.5–8.1)

## 2019-07-22 LAB — GLUCOSE, CAPILLARY
Glucose-Capillary: 148 mg/dL — ABNORMAL HIGH (ref 70–99)
Glucose-Capillary: 100 mg/dL — ABNORMAL HIGH (ref 70–99)
Glucose-Capillary: 153 mg/dL — ABNORMAL HIGH (ref 70–99)
Glucose-Capillary: 267 mg/dL — ABNORMAL HIGH (ref 70–99)
Glucose-Capillary: 218 mg/dL — ABNORMAL HIGH (ref 70–99)

## 2019-07-22 LAB — PROTIME-INR
INR: 1.3 — ABNORMAL HIGH (ref 0.8–1.2)
Prothrombin Time: 15.8 seconds — ABNORMAL HIGH (ref 11.4–15.2)

## 2019-07-22 LAB — MAGNESIUM: Magnesium: 2 mg/dL (ref 1.7–2.4)

## 2019-07-22 SURGERY — VIDEO ASSISTED THORACOSCOPY (VATS)/DECORTICATION
Anesthesia: General | Site: Chest | Laterality: Right

## 2019-07-22 MED ORDER — MEPERIDINE HCL 25 MG/ML IJ SOLN: 6.2500 mg | INTRAMUSCULAR | Status: DC | PRN

## 2019-07-22 MED ORDER — LACTATED RINGERS IV SOLN: INTRAVENOUS | Status: DC

## 2019-07-22 MED ORDER — ALBUMIN HUMAN 5 % IV SOLN
12.5000 g | Freq: Once | INTRAVENOUS | Status: AC
  Administered 2019-07-22: 11:00:00 12.5 g via INTRAVENOUS

## 2019-07-22 MED ORDER — 0.9 % SODIUM CHLORIDE (POUR BTL) OPTIME
TOPICAL | Status: DC | PRN
  Administered 2019-07-22: 2000 mL

## 2019-07-22 MED ORDER — SENNOSIDES-DOCUSATE SODIUM 8.6-50 MG PO TABS
1.0000 | ORAL_TABLET | Freq: Every day | ORAL | Status: DC
  Administered 2019-07-22 – 2019-07-29 (×7): 1 via ORAL
  Filled 2019-07-22 (×8): qty 1

## 2019-07-22 MED ORDER — MIDAZOLAM HCL 2 MG/2ML IJ SOLN
INTRAMUSCULAR | Status: AC
  Filled 2019-07-22: qty 2

## 2019-07-22 MED ORDER — ALBUMIN HUMAN 5 % IV SOLN
INTRAVENOUS | Status: AC
  Filled 2019-07-22: qty 250

## 2019-07-22 MED ORDER — PROPOFOL 10 MG/ML IV BOLUS
INTRAVENOUS | Status: DC | PRN
  Administered 2019-07-22: 140 mg via INTRAVENOUS

## 2019-07-22 MED ORDER — PHENYLEPHRINE 40 MCG/ML (10ML) SYRINGE FOR IV PUSH (FOR BLOOD PRESSURE SUPPORT)
PREFILLED_SYRINGE | INTRAVENOUS | Status: AC
  Filled 2019-07-22: qty 10

## 2019-07-22 MED ORDER — DEXAMETHASONE SODIUM PHOSPHATE 10 MG/ML IJ SOLN
INTRAMUSCULAR | Status: AC
  Filled 2019-07-22: qty 2

## 2019-07-22 MED ORDER — SODIUM CHLORIDE 0.9 % IV SOLN
INTRAVENOUS | Status: DC
  Administered 2019-07-22 – 2019-07-23 (×2): via INTRAVENOUS

## 2019-07-22 MED ORDER — ACETAMINOPHEN 160 MG/5ML PO SOLN: 1000.0000 mg | Freq: Four times a day (QID) | ORAL | Status: DC

## 2019-07-22 MED ORDER — SUCCINYLCHOLINE CHLORIDE 200 MG/10ML IV SOSY
PREFILLED_SYRINGE | INTRAVENOUS | Status: AC
  Filled 2019-07-22: qty 10

## 2019-07-22 MED ORDER — ACETAMINOPHEN 160 MG/5ML PO SOLN: 325.0000 mg | Freq: Once | ORAL | Status: DC | PRN

## 2019-07-22 MED ORDER — ACETAMINOPHEN 325 MG PO TABS: 325.0000 mg | ORAL_TABLET | Freq: Once | ORAL | Status: DC | PRN

## 2019-07-22 MED ORDER — KETOROLAC TROMETHAMINE 30 MG/ML IJ SOLN
INTRAMUSCULAR | Status: DC | PRN
  Administered 2019-07-22: 30 mg via INTRAVENOUS

## 2019-07-22 MED ORDER — EPHEDRINE 5 MG/ML INJ
INTRAVENOUS | Status: AC
  Filled 2019-07-22: qty 10

## 2019-07-22 MED ORDER — BUPIVACAINE HCL 0.5 % IJ SOLN
INTRAMUSCULAR | Status: DC | PRN
  Administered 2019-07-22: 30 mL

## 2019-07-22 MED ORDER — SPIRONOLACTONE 25 MG PO TABS
200.0000 mg | ORAL_TABLET | Freq: Every day | ORAL | Status: DC
  Administered 2019-07-23 – 2019-07-30 (×8): 200 mg via ORAL
  Filled 2019-07-22 (×8): qty 8

## 2019-07-22 MED ORDER — ACETAMINOPHEN 10 MG/ML IV SOLN: 1000.0000 mg | Freq: Once | INTRAVENOUS | Status: DC | PRN

## 2019-07-22 MED ORDER — LIDOCAINE 2% (20 MG/ML) 5 ML SYRINGE
INTRAMUSCULAR | Status: DC | PRN
  Administered 2019-07-22: 50 mg via INTRAVENOUS

## 2019-07-22 MED ORDER — ACETAMINOPHEN 500 MG PO TABS
1000.0000 mg | ORAL_TABLET | Freq: Four times a day (QID) | ORAL | Status: DC
  Filled 2019-07-22 (×2): qty 2

## 2019-07-22 MED ORDER — DEXAMETHASONE SODIUM PHOSPHATE 10 MG/ML IJ SOLN
INTRAMUSCULAR | Status: DC | PRN
  Administered 2019-07-22: 10 mg via INTRAVENOUS

## 2019-07-22 MED ORDER — PROMETHAZINE HCL 25 MG/ML IJ SOLN: 6.2500 mg | INTRAMUSCULAR | Status: DC | PRN

## 2019-07-22 MED ORDER — LACTATED RINGERS IV SOLN
INTRAVENOUS | Status: DC | PRN
  Administered 2019-07-22: 09:00:00 via INTRAVENOUS

## 2019-07-22 MED ORDER — HYDROMORPHONE HCL 1 MG/ML IJ SOLN: 0.2500 mg | INTRAMUSCULAR | Status: DC | PRN

## 2019-07-22 MED ORDER — PROPOFOL 10 MG/ML IV BOLUS
INTRAVENOUS | Status: AC
  Filled 2019-07-22: qty 20

## 2019-07-22 MED ORDER — BISACODYL 5 MG PO TBEC
10.0000 mg | DELAYED_RELEASE_TABLET | Freq: Every day | ORAL | Status: DC
  Administered 2019-07-23 – 2019-07-30 (×6): 10 mg via ORAL
  Filled 2019-07-22 (×8): qty 2

## 2019-07-22 MED ORDER — SODIUM CHLORIDE (PF) 0.9 % IJ SOLN
INTRAMUSCULAR | Status: DC | PRN
  Administered 2019-07-22: 50 mL

## 2019-07-22 MED ORDER — FENTANYL CITRATE (PF) 250 MCG/5ML IJ SOLN
INTRAMUSCULAR | Status: DC | PRN
  Administered 2019-07-22: 50 ug via INTRAVENOUS
  Administered 2019-07-22: 100 ug via INTRAVENOUS
  Administered 2019-07-22: 50 ug via INTRAVENOUS

## 2019-07-22 MED ORDER — BUPIVACAINE HCL (PF) 0.5 % IJ SOLN
INTRAMUSCULAR | Status: AC
  Filled 2019-07-22: qty 30

## 2019-07-22 MED ORDER — ROCURONIUM BROMIDE 10 MG/ML (PF) SYRINGE
PREFILLED_SYRINGE | INTRAVENOUS | Status: AC
  Filled 2019-07-22: qty 10

## 2019-07-22 MED ORDER — ONDANSETRON HCL 4 MG/2ML IJ SOLN
INTRAMUSCULAR | Status: AC
  Filled 2019-07-22: qty 2

## 2019-07-22 MED ORDER — BUPIVACAINE LIPOSOME 1.3 % IJ SUSP
INTRAMUSCULAR | Status: DC | PRN
  Administered 2019-07-22: 20 mL

## 2019-07-22 MED ORDER — PHENYLEPHRINE 40 MCG/ML (10ML) SYRINGE FOR IV PUSH (FOR BLOOD PRESSURE SUPPORT)
PREFILLED_SYRINGE | INTRAVENOUS | Status: DC | PRN
  Administered 2019-07-22: 80 ug via INTRAVENOUS

## 2019-07-22 MED ORDER — ENOXAPARIN SODIUM 40 MG/0.4ML ~~LOC~~ SOLN
40.0000 mg | SUBCUTANEOUS | Status: DC
  Administered 2019-07-23 – 2019-07-29 (×7): 40 mg via SUBCUTANEOUS
  Filled 2019-07-22 (×7): qty 0.4

## 2019-07-22 MED ORDER — LIDOCAINE 2% (20 MG/ML) 5 ML SYRINGE
INTRAMUSCULAR | Status: AC
  Filled 2019-07-22: qty 5

## 2019-07-22 MED ORDER — KETOROLAC TROMETHAMINE 15 MG/ML IJ SOLN
15.0000 mg | Freq: Four times a day (QID) | INTRAMUSCULAR | Status: AC | PRN
  Administered 2019-07-23 – 2019-07-25 (×5): 15 mg via INTRAVENOUS
  Filled 2019-07-22 (×5): qty 1

## 2019-07-22 MED ORDER — SUGAMMADEX SODIUM 200 MG/2ML IV SOLN
INTRAVENOUS | Status: DC | PRN
  Administered 2019-07-22: 200 mg via INTRAVENOUS

## 2019-07-22 MED ORDER — ROCURONIUM BROMIDE 50 MG/5ML IV SOSY
PREFILLED_SYRINGE | INTRAVENOUS | Status: DC | PRN
  Administered 2019-07-22: 50 mg via INTRAVENOUS
  Administered 2019-07-22: 20 mg via INTRAVENOUS

## 2019-07-22 MED ORDER — ONDANSETRON HCL 4 MG/2ML IJ SOLN
INTRAMUSCULAR | Status: DC | PRN
  Administered 2019-07-22: 4 mg via INTRAVENOUS

## 2019-07-22 MED ORDER — FENTANYL CITRATE (PF) 250 MCG/5ML IJ SOLN
INTRAMUSCULAR | Status: AC
  Filled 2019-07-22: qty 5

## 2019-07-22 SURGICAL SUPPLY — 93 items
APPLICATOR COTTON TIP 6 STRL (MISCELLANEOUS) ×1 IMPLANT
APPLICATOR COTTON TIP 6IN STRL (MISCELLANEOUS) ×3
APPLIER CLIP ROT 10 11.4 M/L (STAPLE)
CANISTER SUCT 3000ML PPV (MISCELLANEOUS) ×3 IMPLANT
CATH THORACIC 28FR (CATHETERS) IMPLANT
CATH THORACIC 28FR RT ANG (CATHETERS) IMPLANT
CATH THORACIC 36FR (CATHETERS) IMPLANT
CATH THORACIC 36FR RT ANG (CATHETERS) IMPLANT
CATH TROCAR 20FR (CATHETERS) IMPLANT
CLIP APPLIE ROT 10 11.4 M/L (STAPLE) IMPLANT
CLIP VESOCCLUDE MED 6/CT (CLIP) ×3 IMPLANT
CONN ST 1/4X3/8  BEN (MISCELLANEOUS)
CONN ST 1/4X3/8 BEN (MISCELLANEOUS) IMPLANT
CONN Y 3/8X3/8X3/8  BEN (MISCELLANEOUS)
CONN Y 3/8X3/8X3/8 BEN (MISCELLANEOUS) IMPLANT
CONT SPEC 4OZ CLIKSEAL STRL BL (MISCELLANEOUS) ×6 IMPLANT
COVER SURGICAL LIGHT HANDLE (MISCELLANEOUS) IMPLANT
COVER WAND RF STERILE (DRAPES) ×3 IMPLANT
DERMABOND ADVANCED (GAUZE/BANDAGES/DRESSINGS) ×2
DERMABOND ADVANCED .7 DNX12 (GAUZE/BANDAGES/DRESSINGS) ×1 IMPLANT
DISSECTOR BLUNT TIP ENDO 5MM (MISCELLANEOUS) IMPLANT
DRAIN CHANNEL 28F RND 3/8 FF (WOUND CARE) IMPLANT
DRAIN CHANNEL 32F RND 10.7 FF (WOUND CARE) IMPLANT
DRAPE CV SPLIT W-CLR ANES SCRN (DRAPES) ×3 IMPLANT
DRAPE ORTHO SPLIT 77X108 STRL (DRAPES) ×2
DRAPE SURG ORHT 6 SPLT 77X108 (DRAPES) ×1 IMPLANT
DRAPE WARM FLUID 44X44 (DRAPES) ×3 IMPLANT
ELECT BLADE 6.5 EXT (BLADE) ×3 IMPLANT
ELECT REM PT RETURN 9FT ADLT (ELECTROSURGICAL) ×3
ELECTRODE REM PT RTRN 9FT ADLT (ELECTROSURGICAL) ×1 IMPLANT
GAUZE SPONGE 4X4 12PLY STRL (GAUZE/BANDAGES/DRESSINGS) ×3 IMPLANT
GLOVE BIO SURGEON STRL SZ7 (GLOVE) ×6 IMPLANT
GOWN STRL REUS W/ TWL LRG LVL3 (GOWN DISPOSABLE) ×2 IMPLANT
GOWN STRL REUS W/ TWL XL LVL3 (GOWN DISPOSABLE) ×1 IMPLANT
GOWN STRL REUS W/TWL LRG LVL3 (GOWN DISPOSABLE) ×4
GOWN STRL REUS W/TWL XL LVL3 (GOWN DISPOSABLE) ×2
HANDLE STAPLE ENDO GIA SHORT (STAPLE) ×2
HEMOSTAT SURGICEL 2X14 (HEMOSTASIS) IMPLANT
KIT BASIN OR (CUSTOM PROCEDURE TRAY) ×3 IMPLANT
KIT SUCTION CATH 14FR (SUCTIONS) IMPLANT
KIT TURNOVER KIT B (KITS) ×3 IMPLANT
NEEDLE 22X1 1/2 (OR ONLY) (NEEDLE) ×6 IMPLANT
NEEDLE SPNL 18GX3.5 QUINCKE PK (NEEDLE) IMPLANT
NS IRRIG 1000ML POUR BTL (IV SOLUTION) ×9 IMPLANT
PACK CHEST (CUSTOM PROCEDURE TRAY) ×3 IMPLANT
PAD ARMBOARD 7.5X6 YLW CONV (MISCELLANEOUS) ×6 IMPLANT
POUCH ENDO CATCH II 15MM (MISCELLANEOUS) IMPLANT
POUCH SPECIMEN RETRIEVAL 10MM (ENDOMECHANICALS) IMPLANT
SCISSORS LAP 5X35 DISP (ENDOMECHANICALS) IMPLANT
SEALANT PROGEL (MISCELLANEOUS) IMPLANT
SEALANT SURG COSEAL 4ML (VASCULAR PRODUCTS) IMPLANT
SEALANT SURG COSEAL 8ML (VASCULAR PRODUCTS) IMPLANT
SEALER LIGASURE MARYLAND 30 (ELECTROSURGICAL) ×3 IMPLANT
SET IRRIG TUBING LAPAROSCOPIC (IRRIGATION / IRRIGATOR) IMPLANT
SOLUTION ANTI FOG 6CC (MISCELLANEOUS) ×3 IMPLANT
SPECIMEN JAR MEDIUM (MISCELLANEOUS) IMPLANT
SPONGE INTESTINAL PEANUT (DISPOSABLE) ×6 IMPLANT
SPONGE TONSIL TAPE 1 RFD (DISPOSABLE) ×3 IMPLANT
STAPLER ENDO GIA 12MM SHORT (STAPLE) ×1 IMPLANT
STOPCOCK 4 WAY LG BORE MALE ST (IV SETS) ×6 IMPLANT
SUT MNCRL AB 3-0 PS2 18 (SUTURE) IMPLANT
SUT MON AB 2-0 CT1 36 (SUTURE) IMPLANT
SUT PDS AB 1 CTX 36 (SUTURE) IMPLANT
SUT PROLENE 4 0 RB 1 (SUTURE)
SUT PROLENE 4-0 RB1 .5 CRCL 36 (SUTURE) IMPLANT
SUT SILK  1 MH (SUTURE) ×2
SUT SILK 1 MH (SUTURE) ×1 IMPLANT
SUT SILK 1 TIES 10X30 (SUTURE) ×3 IMPLANT
SUT SILK 2 0 SH (SUTURE) IMPLANT
SUT SILK 2 0SH CR/8 30 (SUTURE) IMPLANT
SUT VIC AB 1 CTX 36 (SUTURE)
SUT VIC AB 1 CTX36XBRD ANBCTR (SUTURE) IMPLANT
SUT VIC AB 2-0 CT1 27 (SUTURE) ×4
SUT VIC AB 2-0 CT1 TAPERPNT 27 (SUTURE) ×2 IMPLANT
SUT VIC AB 2-0 SH 27 (SUTURE)
SUT VIC AB 2-0 SH 27XBRD (SUTURE) IMPLANT
SUT VIC AB 3-0 SH 27 (SUTURE) ×4
SUT VIC AB 3-0 SH 27X BRD (SUTURE) ×2 IMPLANT
SUT VICRYL 0 UR6 27IN ABS (SUTURE) ×3 IMPLANT
SUT VICRYL 2 TP 1 (SUTURE) IMPLANT
SYR 10ML LL (SYRINGE) ×3 IMPLANT
SYR 30ML LL (SYRINGE) ×3 IMPLANT
SYSTEM SAHARA CHEST DRAIN ATS (WOUND CARE) ×3 IMPLANT
TAPE CLOTH 4X10 WHT NS (GAUZE/BANDAGES/DRESSINGS) ×3 IMPLANT
TAPE CLOTH SURG 4X10 WHT LF (GAUZE/BANDAGES/DRESSINGS) ×3 IMPLANT
TIP APPLICATOR SPRAY EXTEND 16 (VASCULAR PRODUCTS) IMPLANT
TOWEL GREEN STERILE (TOWEL DISPOSABLE) ×3 IMPLANT
TOWEL GREEN STERILE FF (TOWEL DISPOSABLE) ×3 IMPLANT
TRAP SPECIMEN MUCOUS 40CC (MISCELLANEOUS) ×6 IMPLANT
TRAY FOLEY MTR SLVR 16FR STAT (SET/KITS/TRAYS/PACK) ×3 IMPLANT
TROCAR XCEL BLADELESS 5X75MML (TROCAR) ×3 IMPLANT
TUBING EXTENTION W/L.L. (IV SETS) ×6 IMPLANT
WATER STERILE IRR 1000ML POUR (IV SOLUTION) ×3 IMPLANT

## 2019-07-22 NOTE — Anesthesia Procedure Notes (Signed)
Procedure Name: Intubation Date/Time: 07/22/2019 8:46 AM Performed by: Shirlyn Goltz, CRNA Pre-anesthesia Checklist: Patient identified, Emergency Drugs available, Suction available and Patient being monitored Patient Re-evaluated:Patient Re-evaluated prior to induction Oxygen Delivery Method: Circle system utilized Preoxygenation: Pre-oxygenation with 100% oxygen Induction Type: IV induction Ventilation: Mask ventilation without difficulty Laryngoscope Size: Mac and 4 Endobronchial tube: Left, Double lumen EBT, EBT position confirmed by auscultation and EBT position confirmed by fiberoptic bronchoscope and 39 Fr Number of attempts: 1 Airway Equipment and Method: Stylet Placement Confirmation: ETT inserted through vocal cords under direct vision,  positive ETCO2 and breath sounds checked- equal and bilateral Tube secured with: Tape Dental Injury: Teeth and Oropharynx as per pre-operative assessment

## 2019-07-22 NOTE — Progress Notes (Signed)
Per Dr. Kipp Brood okay to treat pt by cuff blood pressure at this time. Dr. Kipp Brood aware of small air leak, no new orders. Will continue to monitor pt closely.

## 2019-07-22 NOTE — Op Note (Signed)
     [address]       [place] [address]             [phone]         07/22/2019 Patient:  .Maree Erie Pre-Op Dx: right pleural effusion   Post-op Dx:  same Procedure: - Right video assisted thoracoscopy - Decortication - Intercostal nerve block  Surgeon and Role:      * Mel Langan, Lucile Crater, MD - Primary    * M. 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  His serum/pleural albumin ration is 2.8, and the pleural fluid total protein is < 3.  Given the elevated WBC and gram + cocci, he will require surgical drainage.   Findings: Straw colored pleural fluid, proteinaceous exudate.  Good expansion of the lung.  Operative Technique: After the risks, benefits and alternatives were thoroughly discussed, the patient was brought to the operative theatre.  Anesthesia was induced, the patient was then placed in a left decubitus position and was prepped and draped in normal sterile fashion.  An appropriate surgical pause was performed, and pre-operative antibiotics were dosed accordingly.  We began with 2cm incision in the anterior axillary line at the 5th intercostal space.  The chest was entered, and we then placed a 1cm incision at the 10th intercostal space, and introduced our camera port.  The lung was directly visualized.  2.5L of straw colored pleural fluid was removed from the pleural space.  Proteinaceous exudate was evident on the lung and in the fissure.   The exudate was carefully removed from the lung.  We achieved good expansion of the lung.  The chest was then irrigated.    An intercostal nerve block was performed under direct visualization.  A 84F chest with then placed, and we watch  the remaining lobes re-expand.  The skin and soft tissue were closed with absorbable suture    The patient tolerated the procedure without any immediate complications, and was transferred to the PACU in stable condition.  Kamica Florance Bary Leriche

## 2019-07-22 NOTE — OR Nursing (Signed)
Dr. Kipp Brood drained 2700 cc of pleural fluid during VATS procedure.

## 2019-07-22 NOTE — Telephone Encounter (Signed)
Lawrence Rogers, I called patient's home number listed in profile, I left a message for patient to call our office once he is discharged from [facility]. If he is still in the hospital a paracentesis might be done while he is there.

## 2019-07-22 NOTE — Brief Op Note (Signed)
07/19/2019 - 07/22/2019  9:45 AM  PATIENT:  Lawrence Rogers  58 y.o. male  PRE-OPERATIVE DIAGNOSIS:  Right Pleural Effusion, Cirrhosis  POST-OPERATIVE DIAGNOSIS: Right Pleural Effusion, Cirrhosis  PROCEDURE:  VIDEO ASSISTED THORACOSCOPY (VATS), DRAINAGE OF PLEURAL EFFUSION, DECORTICATION (Right)  SURGEON:  Lajuana Matte, MD   PHYSICIAN ASSISTANT: Adhya Cocco, PA-C  ANESTHESIA:   general  EBL:  MINIMAL  BLOOD ADMINISTERED:none  DRAINS: 28FR Blake drain in the right pleural space.    LOCAL MEDICATIONS USED:  Exparel  SPECIMEN:  Source of Specimen:  Right pleural peel, pleural fluid  DISPOSITION OF SPECIMEN:  Microbiology lab and pathology  COUNTS:  YES  DICTATION: .Dragon Dictation  PLAN OF CARE: Admit to inpatient   PATIENT DISPOSITION:  PACU - hemodynamically stable.   Delay start of Pharmacological VTE agent (>24hrs) due to surgical blood loss or risk of bleeding: yes

## 2019-07-22 NOTE — Transfer of Care (Signed)
Immediate Anesthesia Transfer of Care Note  Patient: Lawrence Rogers  Procedure(s) Performed: VIDEO ASSISTED THORACOSCOPY (VATS)/DECORTICATION (Right Chest) Drainage Of Pleural Effusion (Right Chest)  Patient Location: PACU  Anesthesia Type:General  Level of Consciousness: drowsy  Airway & Oxygen Therapy: Patient Spontanous Breathing and Patient connected to face mask oxygen  Post-op Assessment: Report given to RN and Post -op Vital signs reviewed and stable  Post vital signs: Reviewed and stable  Last Vitals:  Vitals Value Taken Time  BP 82/63 07/22/19 1015  Temp    Pulse 80 07/22/19 1016  Resp 14 07/22/19 1016  SpO2 98 % 07/22/19 1016  Vitals shown include unvalidated device data.  Last Pain:  Vitals:   07/22/19 0500  TempSrc: Oral  PainSc:       Patients Stated Pain Goal: 0 (61/61/22 4001)  Complications: No apparent anesthesia complications

## 2019-07-22 NOTE — Anesthesia Procedure Notes (Signed)
Arterial Line Insertion Start/End8/04/2019 7:45 AM, 07/22/2019 8:00 AM Performed by: Inda Coke, CRNA, CRNA  Patient location: Pre-op. Preanesthetic checklist: patient identified, IV checked, site marked, risks and benefits discussed, surgical consent, monitors and equipment checked, pre-op evaluation and anesthesia consent Lidocaine 1% used for infiltration and patient sedated Right, radial was placed Catheter size: 20 G Hand hygiene performed , maximum sterile barriers used  and Seldinger technique used Allen's test indicative of satisfactory collateral circulation Attempts: 1 Procedure performed without using ultrasound guided technique. Following insertion, dressing applied. Post procedure assessment: normal

## 2019-07-22 NOTE — Progress Notes (Signed)
     [address]       [place] [address]             [phone]       No events overnight  Vitals:   07/21/19 2100 07/22/19 0500  BP: 96/67 101/68  Pulse: 71 72  Resp: 18 (!) 22  Temp: 98.4 F (36.9 C) 98.6 F (37 C)  SpO2: 92% 95%   Alert NAD RRR Clear on L, decreased on the right  CBC Latest Ref Rng & Units 07/22/2019 07/21/2019 07/19/2019  WBC 4.0 - 10.5 K/uL 5.2 5.7 8.1  Hemoglobin 13.0 - 17.0 g/dL 10.7(L) 11.1(L) 10.6(L)  Hematocrit 39.0 - 52.0 % 34.4(L) 35.7(L) 34.8(L)  Platelets 150 - 400 K/uL 88(L) 87(L) 78(L)   BMP Latest Ref Rng & Units 07/22/2019 07/21/2019 07/20/2019  Glucose 70 - 99 mg/dL 110(H) 137(H) 117(H)  BUN 6 - 20 mg/dL 9 7 11   Creatinine 0.61 - 1.24 mg/dL 0.62 0.75 0.66  BUN/Creat Ratio 6 - 22 (calc) - - -  Sodium 135 - 145 mmol/L 135 134(L) 134(L)  Potassium 3.5 - 5.1 mmol/L 3.7 3.4(L) 3.6  Chloride 98 - 111 mmol/L 101 99 104  CO2 22 - 32 mmol/L 25 25 22   Calcium 8.9 - 10.3 mg/dL 8.5(L) 8.7(L) 8.5(L)    Right pleural effusion, 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

## 2019-07-22 NOTE — Progress Notes (Signed)
Pt transferred back to 4E-23 via bed with staff. Chest tube set up to suction per order. Chest tube output 320 ml on assessment. VSS. Ordered pt lunch tray. Will continue to monitor.

## 2019-07-22 NOTE — Progress Notes (Signed)
PROGRESS NOTE    Lawrence Rogers  WIO:[id number] DOB: 01/18/1961 DOA: 07/19/2019 PCP: [name], MD    Brief Narrative:   58 year old male with a history of NASH Cirrhosis, diabetes mellitus type 2, and GERD presenting with 1 month history of shortness of breath and dyspnea on exertion.  He stated that his breathing and dyspnea on exertion got significantly worse in the 24 hours prior to admission.  In addition, he began developing right-sided pleuritic chest pain.  He had denied any fevers, chills, nausea, vomiting, diarrhea, abdominal pain, headache, coughing, hemoptysis.  In the emergency department, the patient had a low-grade temperature of 99.6 F but remained hemodynamically stable.  He required supplemental oxygen at 3 L for his hypoxia.  WBC was 7.6 at the time of admission.  CT angiogram of the chest showed a large right-sided pleural effusion with complete collapse of the right lower lobe.  There is also some scattered left-sided lung nodules.  There was no pulmonary embolus.  Thoracocentesis was performed in the emergency department draining 1 L of fluid. Pleural fluid studies show an exudate with WBC 22,531.  Preliminary Gram stain shows gram-positive cocci.  The patient was started on vancomycin and ceftriaxone.    Transferred to [facility] on 07/20/2019 for thoracic surgery consultation for empyema and possible decortication.   Assessment & Plan:   Principal Problem:   Empyema lung ([place]) Active Problems:   Diabetes ([name])   Cirrhosis of liver with ascites (HCC)   Acute respiratory distress   Pleural effusion   Acute respiratory failure with hypoxia (HCC)   Empyema (HCC)  Acute hypoxic respiratory failure Right large pleural effusion with empyema  Patient presenting with progressive shortness of breath and right-sided pleuritic chest pain.  CT angiogram chest notable for a large right-sided pleural effusion with complete collapse of the right lower lobe; negative for  pulmonary embolism.  Patient underwent [facility]acentesis by [facility] at [facility] with removal of 1 L fluid with pleural fluid studies notable for WBC count of 22,531 and Gram stain showing gram-positive cocci.  Effusion consistent with exudate/empyema. --Cardiothoracic surgery consulted, appreciate assistance --Pleural Fluid culture 07/19/2019+ for viridans Streptococcus; susceptibilities pending --s/p R VATS with decortication and intercostal nerve block on 07/22/2019 --Intraoperative pleural culture on 07/22/2019: Pending --Continue chest tube to suction per CTS --Continue antibiotics with vancomycin and ceftriaxone --Continue supplemental oxygen, wean to maintain SPO2 greater than 92%  Hx NASH cirrhosis with ascites Patient with mild abdominal distention and lower extremity edema. MELD score on admission 11 correlating with a 6% estimated 5-monthmortality. --Continue furosemide 80 mg p.o. daily --continue spironolactone 200 mg p.o. daily --Sodium restricted diet, fluid restrict 2000 mL's/day --Likely would benefit from therapeutic paracentesis during this hospitalization --Strict I's and O's and daily weights  Type 2 diabetes mellitus Hemoglobin A1c 7.0 on 07/19/2019.  Home regimen includes glipizide 5 mg p.o. daily, metformin 1000 mg p.o. twice daily. --Hold oral hypoglycemics while inpatient --Lantus 10 units subcutaneously daily --Insulin sliding scale for further coverage --Monitor CBGs qAC/HS  GERD: Continue PPI   DVT prophylaxis: Lovenox Code Status: Full code Family Communication: None Disposition Plan: Continue inpatient hospitalization, further dependent on clinical course, anticipate discharge home when medically ready   Consultants:   Cardiothoracic surgery  Procedures:   Thoracentesis by [facility] 8/2 w/ 1L removed  R VATS with decortication and intercostal nerve block on 07/22/2019 by Dr. LKipp Brood Antimicrobials:   Vancomycin 8/2>>  Ceftriaxone  8/2>>   Subjective: Patient  seen and examined at bedside following return from PACU.  Eating lunch, reports appetite improved and shortness of breath much improved following surgical intervention.  No other complaints at this time.  Denies headache, no fever/chills/night sweats, no nausea vomiting/diarrhea, no chest pain, no palpitations, no abdominal pain, no weakness, no issues with bowel/bladder function, no paresthesias.  No acute events overnight per nursing staff.  Objective: Vitals:   07/22/19 1210 07/22/19 1225 07/22/19 1230 07/22/19 1249  BP: (!) 109/58 109/61 110/62 114/62  Pulse: 73 67 68 73  Resp: 17 13 16 17   Temp:   (!) 97.5 F (36.4 C) 98.1 F (36.7 C)  TempSrc:    Oral  SpO2: 94% 97% 97% 96%  Weight:      Height:        Intake/Output Summary (Last 24 hours) at 07/22/2019 1340 Last data filed at 07/22/2019 1236 Gross per 24 hour  Intake 1375 ml  Output 1422 ml  Net -47 ml   Filed Weights   07/19/19 1211 07/19/19 2017  Weight: 99.8 kg 104 kg    Examination:  General exam: Appears calm and comfortable  Respiratory system: Breath sounds slightly decreased right base, normal respiratory effort, no crackles/wheezing, on 3 L nasal cannula, SPO2 95%. Cardiovascular system: S1 & S2 heard, RRR. No JVD, murmurs, rubs, gallops or clicks.  2+ pitting edema bilateral lower extremities to mid shin Gastrointestinal system: Abdomen is nondistended, soft and nontender. No organomegaly or masses felt. Normal bowel sounds heard. Central nervous system: Alert and oriented. No focal neurological deficits. Extremities: Symmetric 5 x 5 power.  2+ pitting edema bilateral lower extremities to mid shin Skin: No rashes, lesions or ulcers Psychiatry: Judgement and insight appear normal. Mood & affect appropriate.     Data Reviewed: I have personally reviewed following labs and imaging studies  CBC: Recent Labs  Lab 07/19/19 1231 07/19/19 2312 07/21/19 1149 07/22/19 0358  WBC 7.6  8.1 5.7 5.2  NEUTROABS 6.7  --   --   --   HGB 12.0* 10.6* 11.1* 10.7*  HCT 40.0 34.8* 35.7* 34.4*  MCV 84.4 82.9 81.7 82.1  PLT 92* 78* 87* 88*   Basic Metabolic Panel: Recent Labs  Lab 07/19/19 1231 07/19/19 2311 07/20/19 0633 07/21/19 1149 07/22/19 0358  NA 136 134* 134* 134* 135  K 3.7 3.7 3.6 3.4* 3.7  CL 105 105 104 99 101  CO2 21* 22 22 25 25   GLUCOSE 169* 182* 117* 137* 110*  BUN 12 10 11 7 9   CREATININE 0.78 0.75 0.66 0.75 0.62  CALCIUM 9.2 8.6* 8.5* 8.7* 8.5*  MG  --   --   --   --  2.0   GFR: Estimated Creatinine Clearance: 129 mL/min (by C-G formula based on SCr of 0.62 mg/dL). Liver Function Tests: Recent Labs  Lab 07/19/19 1231 07/20/19 0633 07/21/19 1149 07/22/19 0358  AST 70* 47* 38 33  ALT 48* 39 36 31  ALKPHOS 117 84 91 84  BILITOT 2.1* 2.6* 1.6* 1.5*  PROT 6.4* 5.3* 5.5* 5.0*  ALBUMIN 3.7 3.0* 2.8* 2.6*   Recent Labs  Lab 07/19/19 1237  LIPASE 24   No results for input(s): AMMONIA in the last 168 hours. Coagulation Profile: Recent Labs  Lab 07/21/19 1600 07/22/19 0358  INR 1.2 1.3*   Cardiac Enzymes: No results for input(s): CKTOTAL, CKMB, CKMBINDEX, TROPONINI in the last 168 hours. BNP (last 3 results) No results for input(s): PROBNP in the last 8760 hours. HbA1C: Recent Labs  07/19/19 2311 07/21/19 1149  HGBA1C 7.0* 7.0*   CBG: Recent Labs  Lab 07/21/19 1615 07/21/19 2123 07/22/19 0605 07/22/19 1012 07/22/19 1305  GLUCAP 160* 124* 148* 100* 153*   Lipid Profile: No results for input(s): CHOL, HDL, LDLCALC, TRIG, CHOLHDL, LDLDIRECT in the last 72 hours. Thyroid Function Tests: No results for input(s): TSH, T4TOTAL, FREET4, T3FREE, THYROIDAB in the last 72 hours. Anemia Panel: No results for input(s): VITAMINB12, FOLATE, FERRITIN, TIBC, IRON, RETICCTPCT in the last 72 hours. Sepsis Labs: No results for input(s): PROCALCITON, LATICACIDVEN in the last 168 hours.  Recent Results (from the past 240 hour(s))  SARS  Coronavirus 2 [facility] order, Performed in [facility] hospital lab) Nasopharyngeal Nasopharyngeal Swab     Status: None   Collection Time: 07/19/19 12:32 PM   Specimen: Nasopharyngeal Swab  Result Value Ref Range Status   SARS Coronavirus 2 NEGATIVE NEGATIVE Final    Comment: (NOTE) If result is NEGATIVE SARS-CoV-2 target nucleic acids are NOT DETECTED. The SARS-CoV-2 RNA is generally detectable in upper and lower  respiratory specimens during the acute phase of infection. The lowest  concentration of SARS-CoV-2 viral copies this assay can detect is 250  copies / mL. A negative result does not preclude SARS-CoV-2 infection  and should not be used as the sole basis for treatment or other  patient management decisions.  A negative result may occur with  improper specimen collection / handling, submission of specimen other  than nasopharyngeal swab, presence of viral mutation(s) within the  areas targeted by this assay, and inadequate number of viral copies  (<250 copies / mL). A negative result must be combined with clinical  observations, patient history, and epidemiological information. If result is POSITIVE SARS-CoV-2 target nucleic acids are DETECTED. The SARS-CoV-2 RNA is generally detectable in upper and lower  respiratory specimens dur ing the acute phase of infection.  Positive  results are indicative of active infection with SARS-CoV-2.  Clinical  correlation with patient history and other diagnostic information is  necessary to determine patient infection status.  Positive results do  not rule out bacterial infection or co-infection with other viruses. If result is PRESUMPTIVE POSTIVE SARS-CoV-2 nucleic acids MAY BE PRESENT.   A presumptive positive result was obtained on the submitted specimen  and confirmed on repeat testing.  While 2019 novel coronavirus  (SARS-CoV-2) nucleic acids may be present in the submitted sample  additional confirmatory testing may be necessary  for epidemiological  and / or clinical management purposes  to differentiate between  SARS-CoV-2 and other Sarbecovirus currently known to infect humans.  If clinically indicated additional testing with an alternate test  methodology [phone]) is advised. The SARS-CoV-2 RNA is generally  detectable in upper and lower respiratory sp ecimens during the acute  phase of infection. The expected result is Negative. Fact Sheet for Patients:  StrictlyIdeas.no Fact Sheet for Healthcare Providers: BankingDealers.co.za This test is not yet approved or cleared by the [place] FDA and has been authorized for detection and/or diagnosis of SARS-CoV-2 by FDA under an Emergency Use Authorization (EUA).  This EUA will remain in effect (meaning this test can be used) for the duration of the COVID-19 declaration under Section 564(b)(1) of the Act, 21 U.S.C. section 360bbb-3(b)(1), unless the authorization is terminated or revoked sooner. Performed at [facility], [address], [place], [place] [address]   Culture, body fluid-bottle     Status: Abnormal (Preliminary result)   Collection Time: 07/19/19  4:18 PM   Specimen:  Pleura  Result Value Ref Range Status   Specimen Description   Final    PLEURAL BOTTLES DRAWN AEROBIC AND ANAEROBIC Performed at [facility], [address] [place], [place] [address]    Special Requests   Final    10CC Performed at [facility], [address], [place], [place] [address]    Gram Stain   Final    GRAM POSITIVE COCCI Gram Stain Report Called to,Read Back By and Verified With: A. ROGERS @0907  08/03/2020KAY AEROBIC AND ANAEROBIC BOTTLES Performed at [facility], [address], [place], [place] [address]    Culture (A)  Final    VIRIDANS STREPTOCOCCUS SUSCEPTIBILITIES TO FOLLOW Performed at [facility], [place] [address] [place], [place] [address]    Report Status PENDING  Incomplete  Gram stain      Status: None   Collection Time: 07/19/19  4:18 PM   Specimen: Pleura  Result Value Ref Range Status   Specimen Description PLEURAL  Final   Special Requests NONE  Final   Gram Stain   Final    CYTOSPIN SMEAR NO ORGANISMS SEEN WBC PRESENT,BOTH PMN AND MONONUCLEAR Performed at [facility], [address] [place], [place] [address]    Report Status 07/19/2019 FINAL  Final  Body fluid culture     Status: None (Preliminary result)   Collection Time: 07/22/19  9:19 AM   Specimen: Pleura  Result Value Ref Range Status   Specimen Description PLEURAL RIGHT  Final   Special Requests PATIENT ON FOLLOWING VANC  Final   Gram Stain   Final    MODERATE WBC PRESENT,BOTH PMN AND MONONUCLEAR NO ORGANISMS SEEN Performed at [facility], [address], [place], [place] [address]    Culture PENDING  Incomplete   Report Status PENDING  Incomplete  Aerobic/Anaerobic Culture (surgical/deep wound)     Status: None (Preliminary result)   Collection Time: 07/22/19  9:27 AM   Specimen: Pleural, Right; Tissue  Result Value Ref Range Status   Specimen Description TISSUE RIGHT PLEURAL  Final   Special Requests PATIENT ON FOLLOWING VANC  Final   Gram Stain   Final    MODERATE WBC PRESENT,BOTH PMN AND MONONUCLEAR NO ORGANISMS SEEN Performed at [facility], [place] [address], [place], [place] [address]    Culture PENDING  Incomplete   Report Status PENDING  Incomplete         Radiology Studies: Dg Chest Port 1 View  Result Date: 07/22/2019 CLINICAL DATA:  Post right VATS. EXAM: PORTABLE CHEST 1 VIEW COMPARISON:  Pelvis 04/06/2019 FINDINGS: The cardiac silhouette is normal. 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 Resolution of previously seen right pleural effusion. Right chest tube in place. No significant pneumothorax. IMPRESSION: 1. Resolution of previously seen right pleural effusion. 2. Right chest tube in place. No significant pneumothorax. Electronically Signed   By:  Fidela Salisbury M.D.   On: 07/22/2019 10:32   Dg Chest Port 1 View  Result Date: 07/21/2019 CLINICAL DATA:  Shortness of breath history of lung surgery EXAM: PORTABLE CHEST 1 VIEW COMPARISON:  CT chest July 19, 2019, radiograph FINDINGS: Again noted is a moderate to large right-sided pleural effusion with hazy opacity in the right lung likely layering pleural effusion. There is a trace blunting of the left costophrenic angle. There is mildly increased pulmonary vasculature seen throughout. Cardiomediastinal silhouette is unchanged from prior exam. No acute osseous findings. IMPRESSION: 1. Moderate-to-large right pleural effusion with probable adjacent layering  effusion. 2. Pulmonary vascular congestion Electronically Signed   By: Prudencio Pair M.D.   On: 07/21/2019 08:57        Scheduled Meds:  acetaminophen  1,000 mg Oral Q6H   Or   acetaminophen (TYLENOL) oral liquid 160 mg/5 mL  1,000 mg Oral Q6H   bisacodyl  10 mg Oral Daily   [START ON 07/23/2019] enoxaparin (LOVENOX) injection  40 mg Subcutaneous Q24H   insulin aspart  0-15 Units Subcutaneous TID WC   insulin aspart  0-5 Units Subcutaneous QHS   insulin glargine  10 Units Subcutaneous QHS   pantoprazole  40 mg Oral Daily   senna-docusate  1 tablet Oral QHS   [START ON 07/23/2019] spironolactone  200 mg Oral Daily   Continuous Infusions:  sodium chloride 75 mL/hr at 07/22/19 1302   cefTRIAXone (ROCEPHIN)  IV Stopped (07/21/19 1027)   vancomycin 1,000 mg (07/22/19 1328)     LOS: 2 days    Time spent: 39 minutes spent on chart review, personally reviewing all imaging studies and labs, discussion with nursing staff, consultants, updating family and interview/physical exam; more than 50% of that time was spent in counseling and/or coordination of care.    Geriann Lafont J [place] DO [facility] Pager [phone]  If 7PM-7AM, please contact night-coverage [email or web address] Password [email or web address] 07/22/2019, 1:40 PM

## 2019-07-22 NOTE — Anesthesia Postprocedure Evaluation (Signed)
Anesthesia Post Note  Patient: Lawrence Rogers  Procedure(s) Performed: VIDEO ASSISTED THORACOSCOPY (VATS)/DECORTICATION (Right Chest) Drainage Of Pleural Effusion (Right Chest)     Patient location during evaluation: PACU Anesthesia Type: General Level of consciousness: awake and alert Pain management: pain level controlled Vital Signs Assessment: post-procedure vital signs reviewed and stable Respiratory status: spontaneous breathing, nonlabored ventilation, respiratory function stable and patient connected to nasal cannula oxygen Cardiovascular status: blood pressure returned to baseline and stable Postop Assessment: no apparent nausea or vomiting Anesthetic complications: no    Last Vitals:  Vitals:   07/22/19 1400 07/22/19 1500  BP: 122/67 111/61  Pulse: 86 78  Resp: 17 15  Temp:    SpO2: 96% 92%    Last Pain:  Vitals:   07/22/19 1249  TempSrc: Oral  PainSc:                  [name]

## 2019-07-23 ENCOUNTER — Encounter (HOSPITAL_COMMUNITY): Payer: Self-pay | Admitting: Thoracic Surgery (Cardiothoracic Vascular Surgery)

## 2019-07-23 ENCOUNTER — Inpatient Hospital Stay (HOSPITAL_COMMUNITY): Payer: BC Managed Care – PPO

## 2019-07-23 LAB — BLOOD GAS, ARTERIAL
Acid-base deficit: 0.5 mmol/L (ref 0.0–2.0)
Bicarbonate: 23.8 mmol/L (ref 20.0–28.0)
O2 Content: 1 L/min
O2 Saturation: 97.8 %
Patient temperature: 98.6
pCO2 arterial: 40.1 mmHg (ref 32.0–48.0)
pH, Arterial: 7.391 (ref 7.350–7.450)
pO2, Arterial: 101 mmHg (ref 83.0–108.0)

## 2019-07-23 LAB — CBC
HCT: 31.5 % — ABNORMAL LOW (ref 39.0–52.0)
Hemoglobin: 9.7 g/dL — ABNORMAL LOW (ref 13.0–17.0)
MCH: 25.7 pg — ABNORMAL LOW (ref 26.0–34.0)
MCHC: 30.8 g/dL (ref 30.0–36.0)
MCV: 83.3 fL (ref 80.0–100.0)
Platelets: 93 10*3/uL — ABNORMAL LOW (ref 150–400)
RBC: 3.78 MIL/uL — ABNORMAL LOW (ref 4.22–5.81)
RDW: 16.8 % — ABNORMAL HIGH (ref 11.5–15.5)
WBC: 5.8 10*3/uL (ref 4.0–10.5)
nRBC: 0 % (ref 0.0–0.2)

## 2019-07-23 LAB — GLUCOSE, CAPILLARY
Glucose-Capillary: 193 mg/dL — ABNORMAL HIGH (ref 70–99)
Glucose-Capillary: 200 mg/dL — ABNORMAL HIGH (ref 70–99)
Glucose-Capillary: 173 mg/dL — ABNORMAL HIGH (ref 70–99)
Glucose-Capillary: 146 mg/dL — ABNORMAL HIGH (ref 70–99)

## 2019-07-23 LAB — CULTURE, BODY FLUID W GRAM STAIN -BOTTLE

## 2019-07-23 LAB — MAGNESIUM: Magnesium: 2 mg/dL (ref 1.7–2.4)

## 2019-07-23 LAB — BASIC METABOLIC PANEL
Anion gap: 9 (ref 5–15)
BUN: 15 mg/dL (ref 6–20)
CO2: 22 mmol/L (ref 22–32)
Calcium: 8.5 mg/dL — ABNORMAL LOW (ref 8.9–10.3)
Chloride: 103 mmol/L (ref 98–111)
Creatinine, Ser: 0.75 mg/dL (ref 0.61–1.24)
GFR calc Af Amer: >60 mL/min (ref >60)
GFR calc non Af Amer: >60 mL/min (ref >60)
Glucose, Bld: 219 mg/dL — ABNORMAL HIGH (ref 70–99)
Potassium: 4.4 mmol/L (ref 3.5–5.1)
Sodium: 134 mmol/L — ABNORMAL LOW (ref 135–145)

## 2019-07-23 LAB — ACID FAST SMEAR (AFB, MYCOBACTERIA): Acid Fast Smear: NEGATIVE

## 2019-07-23 MED ORDER — OXYCODONE HCL 5 MG PO TABS
5.0000 mg | ORAL_TABLET | ORAL | Status: AC | PRN
  Administered 2019-07-23 – 2019-07-24 (×2): 5 mg via ORAL
  Filled 2019-07-23 (×2): qty 1

## 2019-07-23 NOTE — Plan of Care (Signed)
  Problem: Pain Managment: Goal: General experience of comfort will improve Outcome: Progressing   

## 2019-07-23 NOTE — Progress Notes (Signed)
Pharmacy Antibiotic Note  Lawrence Rogers is a 58 y.o. male admitted on 07/19/2019 with strep viridans pneumonia/lung empyema  Pharmacy has been consulted for Vancomycin dosing (he is also on rocephin). Culture sensitivities pending.  -WBC= 5.8, afeb, SCr= 0.75  Plan: Vancomycin 1000 mg IV every 8 hours.  Goal trough 15-20 mcg/mL.  Consider discontinuing vancomycin Ceftriaxone 2000 mg IV every 24 hours. Follow culture sensitivities   Height: 6' 1"  (881.1 cm) Weight: 225 lb 8.5 oz (102.3 kg) IBW/kg (Calculated) : 79.9  Temp (24hrs), Avg:98 F (36.7 C), Min:97.3 F (36.3 C), Max:98.5 F (36.9 C)  Recent Labs  Lab 07/19/19 1231 07/19/19 2311 07/19/19 2312 07/20/19 0633 07/21/19 1149 07/22/19 0358 07/23/19 0535  WBC 7.6  --  8.1  --  5.7 5.2 5.8  CREATININE 0.78 0.75  --  0.66 0.75 0.62 0.75    Estimated Creatinine Clearance: 128.1 mL/min (by C-G formula based on SCr of 0.75 mg/dL).    No Known Allergies  Antimicrobials this admission: CTX 8/3 >>  Vanco 8/3 >>   Dose adjustments this admission: N/A  Microbiology results: 8/2 Plueral fluid:viridans strep 8/5 pleural fluid pending   Thank you for allowing pharmacy to be a part of this patient's care.  Hildred Laser, PharmD Clinical Pharmacist **Pharmacist phone directory can now be found on [email or web address] (PW [email or web address]).  Listed under [place].

## 2019-07-23 NOTE — Progress Notes (Signed)
1 Day Post-Op Procedure(s) (LRB): VIDEO ASSISTED THORACOSCOPY (VATS)/DECORTICATION (Right) Drainage Of Pleural Effusion (Right) Subjective: Awake and alert, denies shortness of breath. Says pain is controlled.   Objective: Vital signs in last 24 hours: Temp:  [97.3 F (36.3 C)-98.5 F (36.9 C)] 98.2 F (36.8 C) (08/06 0406) Pulse Rate:  [65-93] 65 (08/06 0406) Cardiac Rhythm: Normal sinus rhythm (08/06 0015) Resp:  [11-22] 16 (08/06 0406) BP: (72-137)/(44-71) 107/57 (08/06 0406) SpO2:  [88 %-100 %] 100 % (08/06 0406) Arterial Line BP: (73-106)/(43-68) 96/61 (08/06 0015) Weight:  [102.3 kg] 102.3 kg (08/06 0406)     Intake/Output from previous day: 08/05 0701 - 08/06 0700 In: 2725.2 [P.O.:795; I.V.:1430.2; IV Piggyback:500] Out: 2837 [Urine:885; Blood:50; Chest Tube:1902] Intake/Output this shift: No intake/output data recorded.  General appearance: alert, cooperative and mild distress Neurologic: intact Heart: regular rate and rhythm Lungs: Breath sounds are clear anterior.  Ct drained ~670m past 12 hours. No air leak.  Wound: Right chest incision is covered with a dry dressing.   Lab Results: Recent Labs    07/22/19 0358 07/23/19 0535  WBC 5.2 5.8  HGB 10.7* 9.7*  HCT 34.4* 31.5*  PLT 88* 93*   BMET:  Recent Labs    07/22/19 0358 07/23/19 0535  NA 135 134*  K 3.7 4.4  CL 101 103  CO2 25 22  GLUCOSE 110* 219*  BUN 9 15  CREATININE 0.62 0.75  CALCIUM 8.5* 8.5*    PT/INR:  Recent Labs    07/22/19 0358  LABPROT 15.8*  INR 1.3*   ABG    Component Value Date/Time   PHART 7.391 07/23/2019 0540   HCO3 23.8 07/23/2019 0540   ACIDBASEDEF 0.5 07/23/2019 0540   O2SAT 97.8 07/23/2019 0540   CBG (last 3)  Recent Labs    07/22/19 1625 07/22/19 2152 07/23/19 0654  GLUCAP 267* 218* 193*    Assessment/Plan: S/P Procedure(s) (LRB): VIDEO ASSISTED THORACOSCOPY (VATS)/DECORTICATION (Right) Drainage Of Pleural Effusion (Right)  -POD1 VATS, drainage  of recurrent right pleural effusion. Small air leak noted yesterday has resolved. Significant volume of drainage persists. Place tube to water seal. D/C a-line.  Repeat CXR in AM.  -[facility]  Sensitivities pending. Continue broad spectrum abx coverage.   -Diabetes mellitus- hyperglycemic overnight, covered with SSI.   -NASH-mgt per medicine team  -DVT PPX-to resume SQ enoxaparin  this PM.    LOS: 3 days    MAntony Odea PA-C [phone]/05/2019

## 2019-07-23 NOTE — Progress Notes (Signed)
PROGRESS NOTE    LEGION DISCHER  ZRA:[id number] DOB: [birth year] DOA: 07/19/2019 PCP: [name], MD    Brief Narrative:   58 year old male with a history of NASH Cirrhosis, diabetes mellitus type 2, and GERD presenting with 1 month history of shortness of breath and dyspnea on exertion.  He stated that his breathing and dyspnea on exertion got significantly worse in the 24 hours prior to admission.  In addition, he began developing right-sided pleuritic chest pain.  He had denied any fevers, chills, nausea, vomiting, diarrhea, abdominal pain, headache, coughing, hemoptysis.  In the emergency department, the patient had a low-grade temperature of 99.6 F but remained hemodynamically stable.  He required supplemental oxygen at 3 L for his hypoxia.  WBC was 7.6 at the time of admission.  CT angiogram of the chest showed a large right-sided pleural effusion with complete collapse of the right lower lobe.  There is also some scattered left-sided lung nodules.  There was no pulmonary embolus.  Thoracocentesis was performed in the emergency department draining 1 L of fluid. Pleural fluid studies show an exudate with WBC 22,531.  Preliminary Gram stain shows gram-positive cocci.  The patient was started on vancomycin and ceftriaxone.    Transferred to [facility] on 07/20/2019 for thoracic surgery consultation for empyema and possible decortication.   Assessment & Plan:   Principal Problem:   Empyema lung ([place]) Active Problems:   Diabetes ([place])   Cirrhosis of liver with ascites (HCC)   Acute respiratory distress   Pleural effusion   Acute respiratory failure with hypoxia (HCC)   Empyema (HCC)  Acute hypoxic respiratory failure Right large pleural effusion with empyema Streptococcus mitis/oralis Patient presenting with progressive shortness of breath and right-sided pleuritic chest pain.  CT angiogram chest notable for a large right-sided pleural effusion with complete collapse of the right  lower lobe; negative for pulmonary embolism.  Patient underwent thoracentesis by EDP at any [facility] with removal of 1 L fluid with pleural fluid studies notable for WBC count of 22,531 and Gram stain showing gram-positive cocci.  Effusion consistent with exudate/empyema. --Cardiothoracic surgery following, appreciate assistance --Pleural Fluid culture 07/19/2019 positive for Streptococcus mitis/oralis --s/p R VATS with decortication and intercostal nerve block on 07/22/2019 --Intraoperative pleural fluid/tissue culture on 07/22/2019: no growth x 24hrs --chest tube placed to water seal per CTS today --Continue antibiotics with vancomycin and ceftriaxone --Repeat chest x-ray in the a.m.  Hx NASH cirrhosis with ascites Patient with mild abdominal distention and lower extremity edema. MELD score on admission 11 correlating with a 6% estimated 75-monthmortality. --Continue furosemide 80 mg p.o. daily --continue spironolactone 200 mg p.o. daily --Sodium restricted diet, fluid restrict 2000 mL's/day --Likely would benefit from therapeutic paracentesis during this hospitalization --Strict I's and O's and daily weights  Type 2 diabetes mellitus Hemoglobin A1c 7.0 on 07/19/2019.  Home regimen includes glipizide 5 mg p.o. daily, metformin 1000 mg p.o. twice daily. --Hold oral hypoglycemics while inpatient --Lantus 10 units subcutaneously daily --Insulin sliding scale for further coverage --Monitor CBGs qAC/HS  GERD: Continue PPI   DVT prophylaxis: Lovenox Code Status: Full code Family Communication: None Disposition Plan: Continue inpatient hospitalization, further dependent on clinical course, anticipate discharge home when medically ready   Consultants:   Cardiothoracic surgery  Procedures:   Thoracentesis by AForest[facility] NaEDP 8/2 w/ 1L removed  R VATS with decortication and intercostal nerve block on 07/22/2019 by Dr. LKipp Brood Antimicrobials:   Vancomycin 8/2>>  Ceftriaxone 8/2>>  Subjective: Patient seen and examined at bedside, resting comfortably.  Breathing much improved.  Has been titrated off of supplemental oxygen.  Slight irritation at chest tube site.  No other complaints or concerns at this time.  Denies headache, no fever/chills/night sweats, no nausea vomiting/diarrhea, no chest pain, no palpitations, no abdominal pain, no weakness, no issues with bowel/bladder function, no paresthesias.  No acute events overnight per nursing staff.  Objective: Vitals:   07/22/19 1945 07/22/19 2220 07/23/19 0015 07/23/19 0406  BP: (!) 113/58 108/64 110/62 (!) 107/57  Pulse: 75 65 65 65  Resp:    16  Temp: 98.5 F (36.9 C)  98.2 F (36.8 C) 98.2 F (36.8 C)  TempSrc: Oral  Oral Oral  SpO2: 98% 92% 93% 100%  Weight:    102.3 kg  Height:        Intake/Output Summary (Last 24 hours) at 07/23/2019 1208 Last data filed at 07/23/2019 0600 Gross per 24 hour  Intake 2418.31 ml  Output 2544 ml  Net -125.69 ml   Filed Weights   07/19/19 1211 07/19/19 2017 07/23/19 0406  Weight: 99.8 kg 104 kg 102.3 kg    Examination:  General exam: Appears calm and comfortable  Respiratory system: Breath sounds slightly decreased right base, normal respiratory effort, no crackles/wheezing, oxygenating well on room air, chest tube site noted, currently on low wall suction Cardiovascular system: S1 & S2 heard, RRR. No JVD, murmurs, rubs, gallops or clicks.  2+ pitting edema bilateral lower extremities to mid shin Gastrointestinal system: Abdomen is nondistended, soft and nontender. No organomegaly or masses felt. Normal bowel sounds heard. Central nervous system: Alert and oriented. No focal neurological deficits. Extremities: Symmetric 5 x 5 power.  1+ pitting edema bilateral lower extremities to mid shin Skin: No rashes, lesions or ulcers Psychiatry: Judgement and insight appear normal. Mood & affect appropriate.     Data Reviewed: I have personally reviewed following labs and  imaging studies  CBC: Recent Labs  Lab 07/19/19 1231 07/19/19 2312 07/21/19 1149 07/22/19 0358 07/23/19 0535  WBC 7.6 8.1 5.7 5.2 5.8  NEUTROABS 6.7  --   --   --   --   HGB 12.0* 10.6* 11.1* 10.7* 9.7*  HCT 40.0 34.8* 35.7* 34.4* 31.5*  MCV 84.4 82.9 81.7 82.1 83.3  PLT 92* 78* 87* 88* 93*   Basic Metabolic Panel: Recent Labs  Lab 07/19/19 2311 07/20/19 0633 07/21/19 1149 07/22/19 0358 07/23/19 0535  NA 134* 134* 134* 135 134*  K 3.7 3.6 3.4* 3.7 4.4  CL 105 104 99 101 103  CO2 22 22 25 25 22   GLUCOSE 182* 117* 137* 110* 219*  BUN 10 11 7 9 15   CREATININE 0.75 0.66 0.75 0.62 0.75  CALCIUM 8.6* 8.5* 8.7* 8.5* 8.5*  MG  --   --   --  2.0 2.0   GFR: Estimated Creatinine Clearance: 128.1 mL/min (by C-G formula based on SCr of 0.75 mg/dL). Liver Function Tests: Recent Labs  Lab 07/19/19 1231 07/20/19 0633 07/21/19 1149 07/22/19 0358  AST 70* 47* 38 33  ALT 48* 39 36 31  ALKPHOS 117 84 91 84  BILITOT 2.1* 2.6* 1.6* 1.5*  PROT 6.4* 5.3* 5.5* 5.0*  ALBUMIN 3.7 3.0* 2.8* 2.6*   Recent Labs  Lab 07/19/19 1237  LIPASE 24   No results for input(s): AMMONIA in the last 168 hours. Coagulation Profile: Recent Labs  Lab 07/21/19 1600 07/22/19 0358  INR 1.2 1.3*   Cardiac Enzymes: No results  for input(s): CKTOTAL, CKMB, CKMBINDEX, TROPONINI in the last 168 hours. BNP (last 3 results) No results for input(s): PROBNP in the last 8760 hours. HbA1C: Recent Labs    07/21/19 1149  HGBA1C 7.0*   CBG: Recent Labs  Lab 07/22/19 1305 07/22/19 1625 07/22/19 2152 07/23/19 0654 07/23/19 1127  GLUCAP 153* 267* 218* 193* 200*   Lipid Profile: No results for input(s): CHOL, HDL, LDLCALC, TRIG, CHOLHDL, LDLDIRECT in the last 72 hours. Thyroid Function Tests: No results for input(s): TSH, T4TOTAL, FREET4, T3FREE, THYROIDAB in the last 72 hours. Anemia Panel: No results for input(s): VITAMINB12, FOLATE, FERRITIN, TIBC, IRON, RETICCTPCT in the last 72 hours.  Sepsis Labs: No results for input(s): PROCALCITON, LATICACIDVEN in the last 168 hours.  Recent Results (from the past 240 hour(s))  SARS Coronavirus 2 [facility] order, Performed in [facility] hospital lab) Nasopharyngeal Nasopharyngeal Swab     Status: None   Collection Time: 07/19/19 12:32 PM   Specimen: Nasopharyngeal Swab  Result Value Ref Range Status   SARS Coronavirus 2 NEGATIVE NEGATIVE Final    Comment: (NOTE) If result is NEGATIVE SARS-CoV-2 target nucleic acids are NOT DETECTED. The SARS-CoV-2 RNA is generally detectable in upper and lower  respiratory specimens during the acute phase of infection. The lowest  concentration of SARS-CoV-2 viral copies this assay can detect is 250  copies / mL. A negative result does not preclude SARS-CoV-2 infection  and should not be used as the sole basis for treatment or other  patient management decisions.  A negative result may occur with  improper specimen collection / handling, submission of specimen other  than nasopharyngeal swab, presence of viral mutation(s) within the  areas targeted by this assay, and inadequate number of viral copies  (<250 copies / mL). A negative result must be combined with clinical  observations, patient history, and epidemiological information. If result is POSITIVE SARS-CoV-2 target nucleic acids are DETECTED. The SARS-CoV-2 RNA is generally detectable in upper and lower  respiratory specimens dur ing the acute phase of infection.  Positive  results are indicative of active infection with SARS-CoV-2.  Clinical  correlation with patient history and other diagnostic information is  necessary to determine patient infection status.  Positive results do  not rule out bacterial infection or co-infection with other viruses. If result is PRESUMPTIVE POSTIVE SARS-CoV-2 nucleic acids MAY BE PRESENT.   A presumptive positive result was obtained on the submitted specimen  and confirmed on repeat testing.  While  2019 novel coronavirus  (SARS-CoV-2) nucleic acids may be present in the submitted sample  additional confirmatory testing may be necessary for epidemiological  and / or clinical management purposes  to differentiate between  SARS-CoV-2 and other Sarbecovirus currently known to infect humans.  If clinically indicated additional testing with an alternate test  methodology [phone]) is advised. The SARS-CoV-2 RNA is generally  detectable in upper and lower respiratory sp ecimens during the acute  phase of infection. The expected result is Negative. Fact Sheet for Patients:  StrictlyIdeas.no Fact Sheet for Healthcare Providers: BankingDealers.co.za This test is not yet approved or cleared by the [place] FDA and has been authorized for detection and/or diagnosis of SARS-CoV-2 by FDA under an Emergency Use Authorization (EUA).  This EUA will remain in effect (meaning this test can be used) for the duration of the COVID-19 declaration under Section 564(b)(1) of the Act, 21 U.S.C. section 360bbb-3(b)(1), unless the authorization is terminated or revoked sooner. Performed at [facility], [place]  [address], [place], [place] [address]   Culture, body fluid-bottle     Status: Abnormal   Collection Time: 07/19/19  4:18 PM   Specimen: Pleura  Result Value Ref Range Status   Specimen Description   Final    PLEURAL BOTTLES DRAWN AEROBIC AND ANAEROBIC Performed at [facility], [address], [place], [place] [address]    Special Requests   Final    10CC Performed at [facility], [address] [place], [place] [address]    Gram Stain   Final    GRAM POSITIVE COCCI Gram Stain Report Called to,Read Back By and Verified With: A. ROGERS @0907  08/03/2020KAY AEROBIC AND ANAEROBIC BOTTLES Performed at [facility], [address] [place], [place] [address]    Culture STREPTOCOCCUS MITIS/ORALIS (A)  Final   Report Status 07/23/2019 FINAL  Final    Organism ID, Bacteria STREPTOCOCCUS MITIS/ORALIS  Final      Susceptibility   Streptococcus mitis/oralis - MIC*    TETRACYCLINE 4 SENSITIVE Sensitive     VANCOMYCIN <=0.12 SENSITIVE Sensitive     CLINDAMYCIN <=0.25 SENSITIVE Sensitive     * STREPTOCOCCUS MITIS/ORALIS  Gram stain     Status: None   Collection Time: 07/19/19  4:18 PM   Specimen: Pleura  Result Value Ref Range Status   Specimen Description PLEURAL  Final   Special Requests NONE  Final   Gram Stain   Final    CYTOSPIN SMEAR NO ORGANISMS SEEN WBC PRESENT,BOTH PMN AND MONONUCLEAR Performed at [facility], [address], [place], [place] [address]    Report Status 07/19/2019 FINAL  Final  Body fluid culture     Status: None (Preliminary result)   Collection Time: 07/22/19  9:19 AM   Specimen: Pleura  Result Value Ref Range Status   Specimen Description PLEURAL RIGHT  Final   Special Requests PATIENT ON FOLLOWING VANC  Final   Gram Stain   Final    MODERATE WBC PRESENT,BOTH PMN AND MONONUCLEAR NO ORGANISMS SEEN    Culture   Final    NO GROWTH < 24 HOURS Performed at [facility], [place] [address], [place], [place] [address]    Report Status PENDING  Incomplete  Aerobic/Anaerobic Culture (surgical/deep wound)     Status: None (Preliminary result)   Collection Time: 07/22/19  9:27 AM   Specimen: Pleural, Right; Tissue  Result Value Ref Range Status   Specimen Description TISSUE RIGHT PLEURAL  Final   Special Requests PATIENT ON FOLLOWING VANC  Final   Gram Stain   Final    MODERATE WBC PRESENT,BOTH PMN AND MONONUCLEAR NO ORGANISMS SEEN    Culture   Final    NO GROWTH < 24 HOURS Performed at [facility], [place] [address], [place],  [address]    Report Status PENDING  Incomplete         Radiology Studies: Dg Chest Port 1 View  Result Date: 07/23/2019 CLINICAL DATA:  58 year old male postoperative day 1 right side decortication for right pleural effusion. EXAM: PORTABLE CHEST 1 VIEW  COMPARISON:  07/22/2019 and earlier. FINDINGS: Portable AP upright view at 0641 hours. The stable right chest tube. No pneumothorax. Improved right lung ventilation since 07/21/2019 but mildly increased patchy right lung base opacity since the postoperative film yesterday. Mildly lower lung volumes overall. Stable cardiac size and mediastinal contours. Visualized tracheal air column is within normal limits. Stable left lung. No acute osseous abnormality identified. IMPRESSION: 1. Stable right chest tube.  No  pneumothorax. 2. Mildly lower lung volumes with increased right lung base opacity, favor atelectasis. Electronically Signed   By: Genevie Ann M.D.   On: 07/23/2019 09:06   Dg Chest Port 1 View  Result Date: 07/22/2019 CLINICAL DATA:  Post right VATS. EXAM: PORTABLE CHEST 1 VIEW COMPARISON:  Pelvis 04/06/2019 FINDINGS: The cardiac silhouette is normal. 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 Resolution of previously seen right pleural effusion. Right chest tube in place. No significant pneumothorax. IMPRESSION: 1. Resolution of previously seen right pleural effusion. 2. Right chest tube in place. No significant pneumothorax. Electronically Signed   By: Fidela Salisbury M.D.   On: 07/22/2019 10:32        Scheduled Meds: . acetaminophen  1,000 mg Oral Q6H   Or  . acetaminophen (TYLENOL) oral liquid 160 mg/5 mL  1,000 mg Oral Q6H  . bisacodyl  10 mg Oral Daily  . enoxaparin (LOVENOX) injection  40 mg Subcutaneous Q24H  . insulin aspart  0-15 Units Subcutaneous TID WC  . insulin aspart  0-5 Units Subcutaneous QHS  . insulin glargine  10 Units Subcutaneous QHS  . pantoprazole  40 mg Oral Daily  . senna-docusate  1 tablet Oral QHS  . spironolactone  200 mg Oral Daily   Continuous Infusions: . sodium chloride Stopped (07/23/19 0532)  . cefTRIAXone (ROCEPHIN)  IV 2 g (07/23/19 1047)  . vancomycin 200 mL/hr at 07/23/19 0600     LOS: 3 days    Time spent: 36 minutes spent on  chart review, personally reviewing all imaging studies and labs, discussion with nursing staff, consultants, updating family and interview/physical exam; more than 50% of that time was spent in counseling and/or coordination of care.    Shishir Krantz J [place] DO [facility] Pager [phone]  If 7PM-7AM, please contact night-coverage [email or web address] Password [email or web address] 07/23/2019, 12:08 PM

## 2019-07-24 ENCOUNTER — Inpatient Hospital Stay (HOSPITAL_COMMUNITY): Payer: BC Managed Care – PPO

## 2019-07-24 LAB — CBC
HCT: 35.5 % — ABNORMAL LOW (ref 39.0–52.0)
Hemoglobin: 11 g/dL — ABNORMAL LOW (ref 13.0–17.0)
MCH: 25.7 pg — ABNORMAL LOW (ref 26.0–34.0)
MCHC: 31 g/dL (ref 30.0–36.0)
MCV: 82.9 fL (ref 80.0–100.0)
Platelets: 114 10*3/uL — ABNORMAL LOW (ref 150–400)
RBC: 4.28 MIL/uL (ref 4.22–5.81)
RDW: 17.2 % — ABNORMAL HIGH (ref 11.5–15.5)
WBC: 5.9 10*3/uL (ref 4.0–10.5)
nRBC: 0 % (ref 0.0–0.2)

## 2019-07-24 LAB — COMPREHENSIVE METABOLIC PANEL
ALT: 37 U/L (ref 0–44)
AST: 44 U/L — ABNORMAL HIGH (ref 15–41)
Albumin: 2.6 g/dL — ABNORMAL LOW (ref 3.5–5.0)
Alkaline Phosphatase: 108 U/L (ref 38–126)
Anion gap: 9 (ref 5–15)
BUN: 16 mg/dL (ref 6–20)
CO2: 22 mmol/L (ref 22–32)
Calcium: 8.5 mg/dL — ABNORMAL LOW (ref 8.9–10.3)
Chloride: 103 mmol/L (ref 98–111)
Creatinine, Ser: 0.8 mg/dL (ref 0.61–1.24)
GFR calc Af Amer: >60 mL/min (ref >60)
GFR calc non Af Amer: >60 mL/min (ref >60)
Glucose, Bld: 118 mg/dL — ABNORMAL HIGH (ref 70–99)
Potassium: 4 mmol/L (ref 3.5–5.1)
Sodium: 134 mmol/L — ABNORMAL LOW (ref 135–145)
Total Bilirubin: 1.5 mg/dL — ABNORMAL HIGH (ref 0.3–1.2)
Total Protein: 4.9 g/dL — ABNORMAL LOW (ref 6.5–8.1)

## 2019-07-24 LAB — GLUCOSE, CAPILLARY
Glucose-Capillary: 91 mg/dL (ref 70–99)
Glucose-Capillary: 157 mg/dL — ABNORMAL HIGH (ref 70–99)
Glucose-Capillary: 104 mg/dL — ABNORMAL HIGH (ref 70–99)
Glucose-Capillary: 145 mg/dL — ABNORMAL HIGH (ref 70–99)

## 2019-07-24 MED ORDER — FUROSEMIDE 10 MG/ML IJ SOLN
40.0000 mg | Freq: Two times a day (BID) | INTRAMUSCULAR | Status: DC
  Administered 2019-07-24 (×2): 40 mg via INTRAVENOUS
  Filled 2019-07-24 (×2): qty 4

## 2019-07-24 MED ORDER — ALBUMIN HUMAN 25 % IV SOLN
50.0000 g | INTRAVENOUS | Status: AC
  Administered 2019-07-24: 12:00:00 50 g via INTRAVENOUS
  Filled 2019-07-24: qty 200

## 2019-07-24 NOTE — Progress Notes (Signed)
PROGRESS NOTE    LYNNE RIGHI  NOI:[id number] DOB: 26-Sep-1961 DOA: 07/19/2019 PCP: [name], MD    Brief Narrative:   58 year old male with a history of NASH Cirrhosis, diabetes mellitus type 2, and GERD presenting with 1 month history of shortness of breath and dyspnea on exertion.  He stated that his breathing and dyspnea on exertion got significantly worse in the 24 hours prior to admission.  In addition, he began developing right-sided pleuritic chest pain.  He had denied any fevers, chills, nausea, vomiting, diarrhea, abdominal pain, headache, coughing, hemoptysis.  In the emergency department, the patient had a low-grade temperature of 99.6 F but remained hemodynamically stable.  He required supplemental oxygen at 3 L for his hypoxia.  WBC was 7.6 at the time of admission.  CT angiogram of the chest showed a large right-sided pleural effusion with complete collapse of the right lower lobe.  There is also some scattered left-sided lung nodules.  There was no pulmonary embolus.  Thoracocentesis was performed in the emergency department draining 1 L of fluid. Pleural fluid studies show an exudate with WBC 22,531.  Preliminary Gram stain shows gram-positive cocci.  The patient was started on vancomycin and ceftriaxone.    Transferred to [facility] on 07/20/2019 for thoracic surgery consultation for empyema and possible decortication.   Assessment & Plan:   Principal Problem:   Empyema lung ([place]) Active Problems:   Diabetes ([place])   Cirrhosis of liver with ascites (HCC)   Acute respiratory distress   Pleural effusion   Acute respiratory failure with hypoxia (HCC)   Empyema (HCC)  Acute hypoxic respiratory failure Right large pleural effusion with empyema Streptococcus mitis/oralis Patient presenting with progressive shortness of breath and right-sided pleuritic chest pain.  CT angiogram chest notable for a large right-sided pleural effusion with complete collapse of the right  lower lobe; negative for pulmonary embolism.  Patient underwent thoracentesis by EDP at any [facility] with removal of 1 L fluid with pleural fluid studies notable for WBC count of 22,531 and Gram stain showing gram-positive cocci.  Effusion consistent with exudate/empyema. --Cardiothoracic surgery following, appreciate assistance --Pleural Fluid culture 07/19/2019 positive for Streptococcus mitis/oralis --s/p R VATS with decortication and intercostal nerve block on 07/22/2019 --Intraoperative pleural fluid/tissue culture on 07/22/2019: no growth x 2 days --chest tube placed to water seal per CTS --CT w/ 1661m out past 24h --Continue antibiotics with ceftriaxone --Repeat chest x-ray in the a.m.  Hx NASH cirrhosis with ascites Patient with mild abdominal distention and lower extremity edema. MELD score on admission 11 correlating with a 6% estimated 361-monthortality.  On furosemide 80 mg p.o. daily and spironolactone 200 mg p.o. daily at home. --furosemide 40 mg IV BID --continue spironolactone 200 mg p.o. daily --Sodium restricted diet, fluid restrict 2000 mL's/day --IR for therapeutic paracentesis today, will administer 50 g of IV albumin with procedure --Strict I's and O's and daily weights  Type 2 diabetes mellitus Hemoglobin A1c 7.0 on 07/19/2019.  Home regimen includes glipizide 5 mg p.o. daily, metformin 1000 mg p.o. twice daily. --Hold oral hypoglycemics while inpatient --Lantus 10 units subcutaneously daily --Insulin sliding scale for further coverage --Monitor CBGs qAC/HS  GERD: Continue PPI  Thrombocytopenia Etiology likely secondary to underlying [place] cirrhosis.  Platelet count 114 today.  Continues on Lovenox for DVT prophylaxis. --Continue to monitor platelet count closely   DVT prophylaxis: Lovenox; continue to monitor platelet count closely Code Status: Full code Family Communication: None Disposition Plan: Continue inpatient hospitalization, further  dependent on  clinical course, anticipate discharge home when medically ready   Consultants:   Cardiothoracic surgery  Procedures:   Thoracentesis by [facility] EDP 8/2 w/ 1L removed  R VATS with decortication and intercostal nerve block on 07/22/2019 by Dr. Kipp Brood  Paracentesis 07/24/2019  Antimicrobials:   Vancomycin 8/2>>  Ceftriaxone 8/2>>   Subjective: Patient seen and examined at bedside, resting comfortably.  Continues with improved shortness of breath.  No other complaints or concerns at this time.  Denies headache, no fever/chills/night sweats, no nausea vomiting/diarrhea, no chest pain, no palpitations, no abdominal pain, no weakness, no issues with bowel/bladder function, no paresthesias.  No acute events overnight per nursing staff.  Objective: Vitals:   07/24/19 0250 07/24/19 0404 07/24/19 0808 07/24/19 1127  BP: 107/68 108/61 (!) 101/54 90/63  Pulse: 66 68 69 69  Resp:  13    Temp: 98.2 F (36.8 C) 98.5 F (36.9 C) 98.2 F (36.8 C) 97.9 F (36.6 C)  TempSrc: Oral Oral Oral Oral  SpO2: 95% 92% 98% 96%  Weight:  102.4 kg    Height:        Intake/Output Summary (Last 24 hours) at 07/24/2019 1243 Last data filed at 07/24/2019 0930 Gross per 24 hour  Intake 870 ml  Output 2550 ml  Net -1680 ml   Filed Weights   07/19/19 2017 07/23/19 0406 07/24/19 0404  Weight: 104 kg 102.3 kg 102.4 kg    Examination:  General exam: Appears calm and comfortable  Respiratory system: Breath sounds slightly decreased right base, normal respiratory effort, no crackles/wheezing, oxygenating well on room air, chest tube site noted, currently on low wall suction Cardiovascular system: S1 & S2 heard, RRR. No JVD, murmurs, rubs, gallops or clicks.  1+ pitting edema bilateral lower extremities to mid shin Gastrointestinal system: Abdomen is nondistended, soft and nontender. No organomegaly or masses felt. Normal bowel sounds heard. Central nervous system: Alert and oriented. No focal  neurological deficits. Extremities: Symmetric 5 x 5 power.  1+ pitting edema bilateral lower extremities to mid shin Skin: No rashes, lesions or ulcers Psychiatry: Judgement and insight appear normal. Mood & affect appropriate.     Data Reviewed: I have personally reviewed following labs and imaging studies  CBC: Recent Labs  Lab 07/19/19 1231 07/19/19 2312 07/21/19 1149 07/22/19 0358 07/23/19 0535 07/24/19 0722  WBC 7.6 8.1 5.7 5.2 5.8 5.9  NEUTROABS 6.7  --   --   --   --   --   HGB 12.0* 10.6* 11.1* 10.7* 9.7* 11.0*  HCT 40.0 34.8* 35.7* 34.4* 31.5* 35.5*  MCV 84.4 82.9 81.7 82.1 83.3 82.9  PLT 92* 78* 87* 88* 93* 630*   Basic Metabolic Panel: Recent Labs  Lab 07/20/19 0633 07/21/19 1149 07/22/19 0358 07/23/19 0535 07/24/19 0722  NA 134* 134* 135 134* 134*  K 3.6 3.4* 3.7 4.4 4.0  CL 104 99 101 103 103  CO2 22 25 25 22 22   GLUCOSE 117* 137* 110* 219* 118*  BUN 11 7 9 15 16   CREATININE 0.66 0.75 0.62 0.75 0.80  CALCIUM 8.5* 8.7* 8.5* 8.5* 8.5*  MG  --   --  2.0 2.0  --    GFR: Estimated Creatinine Clearance: 128.1 mL/min (by C-G formula based on SCr of 0.8 mg/dL). Liver Function Tests: Recent Labs  Lab 07/19/19 1231 07/20/19 0633 07/21/19 1149 07/22/19 0358 07/24/19 0722  AST 70* 47* 38 33 44*  ALT 48* 39 36 31 37  ALKPHOS 117 84  91 84 108  BILITOT 2.1* 2.6* 1.6* 1.5* 1.5*  PROT 6.4* 5.3* 5.5* 5.0* 4.9*  ALBUMIN 3.7 3.0* 2.8* 2.6* 2.6*   Recent Labs  Lab 07/19/19 1237  LIPASE 24   No results for input(s): AMMONIA in the last 168 hours. Coagulation Profile: Recent Labs  Lab 07/21/19 1600 07/22/19 0358  INR 1.2 1.3*   Cardiac Enzymes: No results for input(s): CKTOTAL, CKMB, CKMBINDEX, TROPONINI in the last 168 hours. BNP (last 3 results) No results for input(s): PROBNP in the last 8760 hours. HbA1C: No results for input(s): HGBA1C in the last 72 hours. CBG: Recent Labs  Lab 07/23/19 1127 07/23/19 1653 07/23/19 2128 07/24/19 0625  07/24/19 1126  GLUCAP 200* 173* 146* 91 157*   Lipid Profile: No results for input(s): CHOL, HDL, LDLCALC, TRIG, CHOLHDL, LDLDIRECT in the last 72 hours. Thyroid Function Tests: No results for input(s): TSH, T4TOTAL, FREET4, T3FREE, THYROIDAB in the last 72 hours. Anemia Panel: No results for input(s): VITAMINB12, FOLATE, FERRITIN, TIBC, IRON, RETICCTPCT in the last 72 hours. Sepsis Labs: No results for input(s): PROCALCITON, LATICACIDVEN in the last 168 hours.  Recent Results (from the past 240 hour(s))  SARS Coronavirus 2 [facility] order, Performed in [facility] hospital lab) Nasopharyngeal Nasopharyngeal Swab     Status: None   Collection Time: 07/19/19 12:32 PM   Specimen: Nasopharyngeal Swab  Result Value Ref Range Status   SARS Coronavirus 2 NEGATIVE NEGATIVE Final    Comment: (NOTE) If result is NEGATIVE SARS-CoV-2 target nucleic acids are NOT DETECTED. The SARS-CoV-2 RNA is generally detectable in upper and lower  respiratory specimens during the acute phase of infection. The lowest  concentration of SARS-CoV-2 viral copies this assay can detect is 250  copies / mL. A negative result does not preclude SARS-CoV-2 infection  and should not be used as the sole basis for treatment or other  patient management decisions.  A negative result may occur with  improper specimen collection / handling, submission of specimen other  than nasopharyngeal swab, presence of viral mutation(s) within the  areas targeted by this assay, and inadequate number of viral copies  (<250 copies / mL). A negative result must be combined with clinical  observations, patient history, and epidemiological information. If result is POSITIVE SARS-CoV-2 target nucleic acids are DETECTED. The SARS-CoV-2 RNA is generally detectable in upper and lower  respiratory specimens dur ing the acute phase of infection.  Positive  results are indicative of active infection with SARS-CoV-2.  Clinical  correlation  with patient history and other diagnostic information is  necessary to determine patient infection status.  Positive results do  not rule out bacterial infection or co-infection with other viruses. If result is PRESUMPTIVE POSTIVE SARS-CoV-2 nucleic acids MAY BE PRESENT.   A presumptive positive result was obtained on the submitted specimen  and confirmed on repeat testing.  While 2019 novel coronavirus  (SARS-CoV-2) nucleic acids may be present in the submitted sample  additional confirmatory testing may be necessary for epidemiological  and / or clinical management purposes  to differentiate between  SARS-CoV-2 and other Sarbecovirus currently known to infect humans.  If clinically indicated additional testing with an alternate test  methodology [phone]) is advised. The SARS-CoV-2 RNA is generally  detectable in upper and lower respiratory sp ecimens during the acute  phase of infection. The expected result is Negative. Fact Sheet for Patients:  StrictlyIdeas.no Fact Sheet for Healthcare Providers: BankingDealers.co.za This test is not yet approved or cleared by the [place]  States FDA and has been authorized for detection and/or diagnosis of SARS-CoV-2 by FDA under an Emergency Use Authorization (EUA).  This EUA will remain in effect (meaning this test can be used) for the duration of the COVID-19 declaration under Section 564(b)(1) of the Act, 21 U.S.C. section 360bbb-3(b)(1), unless the authorization is terminated or revoked sooner. Performed at [facility], [address] [place], [place] [address]   Culture, body fluid-bottle     Status: Abnormal   Collection Time: 07/19/19  4:18 PM   Specimen: Pleura  Result Value Ref Range Status   Specimen Description   Final    PLEURAL BOTTLES DRAWN AEROBIC AND ANAEROBIC Performed at [facility], [address] [place], [place] [address]    Special Requests   Final    10CC Performed at  [facility], [address], [place], [place] [address]    Gram Stain   Final    GRAM POSITIVE COCCI Gram Stain Report Called to,Read Back By and Verified With: A. ROGERS @0907  08/03/2020KAY AEROBIC AND ANAEROBIC BOTTLES Performed at [facility] [address], [place], [place] [address]    Culture STREPTOCOCCUS MITIS/ORALIS (A)  Final   Report Status 07/23/2019 FINAL  Final   Organism ID, Bacteria STREPTOCOCCUS MITIS/ORALIS  Final      Susceptibility   Streptococcus mitis/oralis - MIC*    TETRACYCLINE 4 SENSITIVE Sensitive     VANCOMYCIN <=0.12 SENSITIVE Sensitive     CLINDAMYCIN <=0.25 SENSITIVE Sensitive     PENICILLIN Value in next row Sensitive      SENSITIVE<=0.06    CEFTRIAXONE Value in next row Sensitive      SENSITIVE<=0.12    * STREPTOCOCCUS MITIS/ORALIS  Gram stain     Status: None   Collection Time: 07/19/19  4:18 PM   Specimen: Pleura  Result Value Ref Range Status   Specimen Description PLEURAL  Final   Special Requests NONE  Final   Gram Stain   Final    CYTOSPIN SMEAR NO ORGANISMS SEEN WBC PRESENT,BOTH PMN AND MONONUCLEAR Performed at [facility], [address] [place], [place] [address]    Report Status 07/19/2019 FINAL  Final  Acid Fast Smear (AFB)     Status: None   Collection Time: 07/22/19  9:19 AM   Specimen: Pleural, Right; Body Fluid  Result Value Ref Range Status   AFB Specimen Processing Concentration  Final   Acid Fast Smear Negative  Final    Comment: (NOTE) Performed At: [facility] [place], [place] [id number] Rush Farmer MD KP:[id number]    Source (AFB) PLEURAL  Final    Comment: RIGHT Performed at [facility], [place] [address] [place], [place] [address]   Body fluid culture     Status: None (Preliminary result)   Collection Time: 07/22/19  9:19 AM   Specimen: Pleura  Result Value Ref Range Status   Specimen Description PLEURAL RIGHT  Final   Special Requests PATIENT ON FOLLOWING VANC  Final    Gram Stain   Final    MODERATE WBC PRESENT,BOTH PMN AND MONONUCLEAR NO ORGANISMS SEEN    Culture   Final    NO GROWTH 2 DAYS Performed at [facility], [place] [address], [place],  [address]    Report Status PENDING  Incomplete  Aerobic/Anaerobic Culture (surgical/deep wound)     Status: None (Preliminary result)   Collection Time: 07/22/19  9:27 AM   Specimen: Pleural, Right; Tissue  Result Value Ref  Range Status   Specimen Description TISSUE RIGHT PLEURAL  Final   Special Requests PATIENT ON FOLLOWING VANC  Final   Gram Stain   Final    MODERATE WBC PRESENT,BOTH PMN AND MONONUCLEAR NO ORGANISMS SEEN    Culture   Final    NO GROWTH 2 DAYS NO ANAEROBES ISOLATED; CULTURE IN PROGRESS FOR 5 DAYS Performed at [facility], [address] [place], [place] [address]    Report Status PENDING  Incomplete         Radiology Studies: Dg Chest Port 1 View  Result Date: 07/24/2019 CLINICAL DATA:  Pleural effusion. EXAM: PORTABLE CHEST 1 VIEW COMPARISON:  07/23/2019.  07/22/2019.  CT 07/19/2019. FINDINGS: Right chest tube in stable position. Stable cardiomegaly. Persistent low lung volumes. Progressive infiltrate right lower lung. Small right pleural effusion cannot be excluded. Reference is made to prior CT report for discussion of pulmonary nodules present. IMPRESSION: 1.  Right chest tube in stable position.  No pneumothorax. 2. Low lung volumes. Progressive right lower lung infiltrate. Right pleural effusion again noted. Electronically Signed   By: Marcello Moores  Register   On: 07/24/2019 08:39   Dg Chest Port 1 View  Result Date: 07/23/2019 CLINICAL DATA:  58 year old male postoperative day 1 right side decortication for right pleural effusion. EXAM: PORTABLE CHEST 1 VIEW COMPARISON:  07/22/2019 and earlier. FINDINGS: Portable AP upright view at 0641 hours. The stable right chest tube. No pneumothorax. Improved right lung ventilation since 07/21/2019 but mildly increased patchy  right lung base opacity since the postoperative film yesterday. Mildly lower lung volumes overall. Stable cardiac size and mediastinal contours. Visualized tracheal air column is within normal limits. Stable left lung. No acute osseous abnormality identified. IMPRESSION: 1. Stable right chest tube.  No pneumothorax. 2. Mildly lower lung volumes with increased right lung base opacity, favor atelectasis. Electronically Signed   By: Genevie Ann M.D.   On: 07/23/2019 09:06        Scheduled Meds:  acetaminophen  1,000 mg Oral Q6H   Or   acetaminophen (TYLENOL) oral liquid 160 mg/5 mL  1,000 mg Oral Q6H   bisacodyl  10 mg Oral Daily   enoxaparin (LOVENOX) injection  40 mg Subcutaneous Q24H   furosemide  40 mg Intravenous BID   insulin aspart  0-15 Units Subcutaneous TID WC   insulin aspart  0-5 Units Subcutaneous QHS   insulin glargine  10 Units Subcutaneous QHS   pantoprazole  40 mg Oral Daily   senna-docusate  1 tablet Oral QHS   spironolactone  200 mg Oral Daily   Continuous Infusions:  cefTRIAXone (ROCEPHIN)  IV 2 g (07/24/19 1221)     LOS: 4 days    Time spent: 36 minutes spent on chart review, personally reviewing all imaging studies and labs, discussion with nursing staff, consultants, updating family and interview/physical exam; more than 50% of that time was spent in counseling and/or coordination of care.    Lavere Shinsky J [place] DO [facility] Pager [phone]  If 7PM-7AM, please contact night-coverage [email or web address] Password [facility] 07/24/2019, 12:43 PM

## 2019-07-24 NOTE — Progress Notes (Addendum)
2 Days Post-Op Procedure(s) (LRB): VIDEO ASSISTED THORACOSCOPY (VATS)/DECORTICATION (Right) Drainage Of Pleural Effusion (Right) Subjective: Up in the bedside chair. Says he rested better last night and is feeling better in general. Still  having some soreness at the right chest incision. The chest tube drained 1692m past 24 hours.   Objective: Vital signs in last 24 hours: Temp:  [98.2 F (36.8 C)-98.7 F (37.1 C)] 98.5 F (36.9 C) (08/07 0404) Pulse Rate:  [66-93] 68 (08/07 0404) Cardiac Rhythm: Normal sinus rhythm (08/07 0250) Resp:  [11-18] 13 (08/07 0404) BP: (105-117)/(59-75) 108/61 (08/07 0404) SpO2:  [91 %-98 %] 92 % (08/07 0404) Weight:  [102.4 kg] 102.4 kg (08/07 0404)     Intake/Output from previous day: 08/06 0701 - 08/07 0700 In: 1300 [P.O.:1200; IV Piggyback:100] Out: 1950 [Urine:300; Chest Tube:1650] Intake/Output this shift: No intake/output data recorded.  Physical Exam: General appearance: alert, cooperative and no distress Neurologic: intact Heart: regular rate and rhythm Lungs: Breath sounds are clear.  Ct drained 16569mpast 24 hours. No air leak. All connections secure.  Wound: Right chest incision is covered with a dry dressing.   Lab Results: Recent Labs    07/22/19 0358 07/23/19 0535  WBC 5.2 5.8  HGB 10.7* 9.7*  HCT 34.4* 31.5*  PLT 88* 93*   BMET:  Recent Labs    07/22/19 0358 07/23/19 0535  NA 135 134*  K 3.7 4.4  CL 101 103  CO2 25 22  GLUCOSE 110* 219*  BUN 9 15  CREATININE 0.62 0.75  CALCIUM 8.5* 8.5*    PT/INR:  Recent Labs    07/22/19 0358  LABPROT 15.8*  INR 1.3*   ABG    Component Value Date/Time   PHART 7.391 07/23/2019 0540   HCO3 23.8 07/23/2019 0540   ACIDBASEDEF 0.5 07/23/2019 0540   O2SAT 97.8 07/23/2019 0540   CBG (last 3)  Recent Labs    07/23/19 1653 07/23/19 2128 07/24/19 0625  GLUCAP 173* 146* 91    Assessment/Plan: S/P Procedure(s) (LRB): VIDEO ASSISTED THORACOSCOPY  (VATS)/DECORTICATION (Right) Drainage Of Pleural Effusion (Right)  -POD2 VATS, drainage of recurrent right pleural effusion. No air leak but a large volume of drainage persists. Will leave the chest tube to water seal.  Repeat CXR in AM.  -[facility]  Sensitivities pending. Pleural fluid cx from 8/5 show no growth at 24 hours.  Continue broad spectrum abx coverage.   -Diabetes mellitus-mgt per medicine service,  covered with SSI.   -NASH-mgt per medicine team  -DVT PPX-  SQ enoxaparin resumed last PM, need to watch plt count.     LOS: 4 days   MyAntony OdeaPA-C] [phone]/06/2019  Continues to have large volume output from the chest tube Will need medical optimization of his cirrhosis Continue CT to drainage for now.  Lomax Poehler O Bary Leriche

## 2019-07-24 NOTE — Progress Notes (Signed)
Per PA Progress Note, CT to H20 seal @ 0020 on 8/7. Will convey to Day RN.

## 2019-07-25 ENCOUNTER — Inpatient Hospital Stay (HOSPITAL_COMMUNITY): Payer: BC Managed Care – PPO

## 2019-07-25 LAB — CBC
HCT: 33.4 % — ABNORMAL LOW (ref 39.0–52.0)
Hemoglobin: 10.2 g/dL — ABNORMAL LOW (ref 13.0–17.0)
MCH: 25.2 pg — ABNORMAL LOW (ref 26.0–34.0)
MCHC: 30.5 g/dL (ref 30.0–36.0)
MCV: 82.7 fL (ref 80.0–100.0)
Platelets: 105 10*3/uL — ABNORMAL LOW (ref 150–400)
RBC: 4.04 MIL/uL — ABNORMAL LOW (ref 4.22–5.81)
RDW: 17.2 % — ABNORMAL HIGH (ref 11.5–15.5)
WBC: 4.6 10*3/uL (ref 4.0–10.5)
nRBC: 0 % (ref 0.0–0.2)

## 2019-07-25 LAB — BODY FLUID CULTURE: Culture: NO GROWTH

## 2019-07-25 LAB — BASIC METABOLIC PANEL
Anion gap: 9 (ref 5–15)
BUN: 13 mg/dL (ref 6–20)
CO2: 23 mmol/L (ref 22–32)
Calcium: 8.4 mg/dL — ABNORMAL LOW (ref 8.9–10.3)
Chloride: 104 mmol/L (ref 98–111)
Creatinine, Ser: 0.69 mg/dL (ref 0.61–1.24)
GFR calc Af Amer: >60 mL/min (ref >60)
GFR calc non Af Amer: >60 mL/min (ref >60)
Glucose, Bld: 108 mg/dL — ABNORMAL HIGH (ref 70–99)
Potassium: 3.7 mmol/L (ref 3.5–5.1)
Sodium: 136 mmol/L (ref 135–145)

## 2019-07-25 LAB — GLUCOSE, CAPILLARY
Glucose-Capillary: 109 mg/dL — ABNORMAL HIGH (ref 70–99)
Glucose-Capillary: 100 mg/dL — ABNORMAL HIGH (ref 70–99)
Glucose-Capillary: 89 mg/dL (ref 70–99)
Glucose-Capillary: 202 mg/dL — ABNORMAL HIGH (ref 70–99)
Glucose-Capillary: 120 mg/dL — ABNORMAL HIGH (ref 70–99)

## 2019-07-25 MED ORDER — FUROSEMIDE 10 MG/ML IJ SOLN
40.0000 mg | Freq: Three times a day (TID) | INTRAMUSCULAR | Status: DC
  Administered 2019-07-25 – 2019-07-30 (×16): 40 mg via INTRAVENOUS
  Filled 2019-07-25 (×16): qty 4

## 2019-07-25 NOTE — Progress Notes (Signed)
PROGRESS NOTE    Lawrence Rogers  TZG:[id number] DOB: [birth year] DOA: 07/19/2019 PCP: [name], MD    Brief Narrative:   58 year old male with a history of NASH Cirrhosis, diabetes mellitus type 2, and GERD presenting with 1 month history of shortness of breath and dyspnea on exertion.  He stated that his breathing and dyspnea on exertion got significantly worse in the 24 hours prior to admission.  In addition, he began developing right-sided pleuritic chest pain.  He had denied any fevers, chills, nausea, vomiting, diarrhea, abdominal pain, headache, coughing, hemoptysis.  In the emergency department, the patient had a low-grade temperature of 99.6 F but remained hemodynamically stable.  He required supplemental oxygen at 3 L for his hypoxia.  WBC was 7.6 at the time of admission.  CT angiogram of the chest showed a large right-sided pleural effusion with complete collapse of the right lower lobe.  There is also some scattered left-sided lung nodules.  There was no pulmonary embolus.  Thoracocentesis was performed in the emergency department draining 1 L of fluid. Pleural fluid studies show an exudate with WBC 22,531.  Preliminary Gram stain shows gram-positive cocci.  The patient was started on vancomycin and ceftriaxone.    Transferred to [facility] on 07/20/2019 for thoracic surgery consultation for empyema and possible decortication.   Assessment & Plan:   Principal Problem:   Empyema lung ([place]) Active Problems:   Diabetes ([place])   Cirrhosis of liver with ascites (HCC)   Acute respiratory distress   Pleural effusion   Acute respiratory failure with hypoxia (HCC)   Empyema (HCC)  Acute hypoxic respiratory failure Right large pleural effusion with empyema Streptococcus mitis/oralis Patient presenting with progressive shortness of breath and right-sided pleuritic chest pain.  CT angiogram chest notable for a large right-sided pleural effusion with complete collapse of the right  lower lobe; negative for pulmonary embolism.  Patient underwent thoracentesis by EDP at any [facility] with removal of 1 L fluid with pleural fluid studies notable for WBC count of 22,531 and Gram stain showing gram-positive cocci.  Effusion consistent with exudate/empyema. --Cardiothoracic surgery following, appreciate assistance --Pleural Fluid culture 07/19/2019 positive for Streptococcus mitis/oralis --s/p R VATS with decortication and intercostal nerve block on 07/22/2019 --Intraoperative pleural fluid/tissue culture on 07/22/2019: no growth x 2 days --chest tube placed to water seal per CTS --CT w/ 1650-->838m out past 24h --Continue antibiotics with ceftriaxone --Repeat chest x-ray in the a.m.  Hx NASH cirrhosis with ascites Patient with mild abdominal distention and lower extremity edema. MELD score on admission 11 correlating with a 6% estimated 340-monthortality.  On furosemide 80 mg p.o. daily and spironolactone 200 mg p.o. daily at home. --increase furosemide to 40 mg IV TID today --continue spironolactone 200 mg p.o. daily --Sodium restricted diet, fluid restrict 2000 mL's/day --IR consulted for therapeutic paracentesis; but not able 2/2 insufficient fluid present in abdomen --Strict I's and O's and daily weights  Type 2 diabetes mellitus Hemoglobin A1c 7.0 on 07/19/2019.  Home regimen includes glipizide 5 mg p.o. daily, metformin 1000 mg p.o. twice daily. 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  Platelet count 105 today.  Continues on Lovenox for DVT prophylaxis. --Continue to monitor platelet count closely   DVT prophylaxis: Lovenox; continue to monitor platelet count closely Code Status: Full code Family Communication: None Disposition Plan: Continue  inpatient  hospitalization, further dependent on clinical course, anticipate discharge home when medically ready   Consultants:   Cardiothoracic surgery  Procedures:   Thoracentesis by [facility] EDP 8/2 w/ 1L removed  R VATS with decortication and intercostal nerve block on 07/22/2019 by Dr. Kipp Brood  Attempt Paracentesis 07/24/2019; not enough fluid per IR  Antimicrobials:   Vancomycin 8/2 - 8/6  Ceftriaxone 8/2>>   Subjective: Patient seen and examined at bedside, resting comfortably.  No complaints or concerns at this time.  CT continues to drain, but less today. IR unable to perform paracentesis on 8/7 due insufficient fluid present in abdomen. Denies headache, no fever/chills/night sweats, no nausea vomiting/diarrhea, no chest pain, no palpitations, no abdominal pain, no weakness, no issues with bowel/bladder function, no paresthesias.  No acute events overnight per nursing staff.  Objective: Vitals:   07/24/19 2358 07/25/19 0408 07/25/19 0736 07/25/19 0800  BP: (!) 110/54 (!) 117/53 112/62   Pulse: 77 73 68 67  Resp: 13 14 16    Temp: 98.3 F (36.8 C) 98.5 F (36.9 C) 98.8 F (37.1 C)   TempSrc: Oral Oral Oral   SpO2: 93% 95% 95% 96%  Weight:  100 kg    Height:        Intake/Output Summary (Last 24 hours) at 07/25/2019 1032 Last data filed at 07/25/2019 0931 Gross per 24 hour  Intake 480 ml  Output 2450 ml  Net -1970 ml   Filed Weights   07/23/19 0406 07/24/19 0404 07/25/19 0408  Weight: 102.3 kg 102.4 kg 100 kg    Examination:  General exam: Appears calm and comfortable  Respiratory system: Breath sounds slightly decreased right base, normal respiratory effort, no crackles/wheezing, oxygenating well on room air, chest tube site noted, currently on waterseal Cardiovascular system: S1 & S2 heard, RRR. No JVD, murmurs, rubs, gallops or clicks.  1+ pitting edema bilateral lower extremities to mid shin Gastrointestinal system: Abdomen is nondistended, soft and nontender. No  organomegaly or masses felt. Normal bowel sounds heard. Central nervous system: Alert and oriented. No focal neurological deficits. Extremities: Symmetric 5 x 5 power.  1+ pitting edema bilateral lower extremities to mid shin Skin: No rashes, lesions or ulcers Psychiatry: Judgement and insight appear normal. Mood & affect appropriate.     Data Reviewed: I have personally reviewed following labs and imaging studies  CBC: Recent Labs  Lab 07/19/19 1231  07/21/19 1149 07/22/19 0358 07/23/19 0535 07/24/19 0722 07/25/19 0229  WBC 7.6   < > 5.7 5.2 5.8 5.9 4.6  NEUTROABS 6.7  --   --   --   --   --   --   HGB 12.0*   < > 11.1* 10.7* 9.7* 11.0* 10.2*  HCT 40.0   < > 35.7* 34.4* 31.5* 35.5* 33.4*  MCV 84.4   < > 81.7 82.1 83.3 82.9 82.7  PLT 92*   < > 87* 88* 93* 114* 105*   < > = values in this interval not displayed.   Basic Metabolic Panel: Recent Labs  Lab 07/21/19 1149 07/22/19 0358 07/23/19 0535 07/24/19 0722 07/25/19 0229  NA 134* 135 134* 134* 136  K 3.4* 3.7 4.4 4.0 3.7  CL 99 101 103 103 104  CO2 25 25 22 22 23   GLUCOSE 137* 110* 219* 118* 108*  BUN 7 9 15 16 13   CREATININE 0.75 0.62 0.75 0.80 0.69  CALCIUM 8.7* 8.5* 8.5* 8.5* 8.4*  MG  --  2.0 2.0  --   --  GFR: Estimated Creatinine Clearance: 126.7 mL/min (by C-G formula based on SCr of 0.69 mg/dL). Liver Function Tests: Recent Labs  Lab 07/19/19 1231 07/20/19 0633 07/21/19 1149 07/22/19 0358 07/24/19 0722  AST 70* 47* 38 33 44*  ALT 48* 39 36 31 37  ALKPHOS 117 84 91 84 108  BILITOT 2.1* 2.6* 1.6* 1.5* 1.5*  PROT 6.4* 5.3* 5.5* 5.0* 4.9*  ALBUMIN 3.7 3.0* 2.8* 2.6* 2.6*   Recent Labs  Lab 07/19/19 1237  LIPASE 24   No results for input(s): AMMONIA in the last 168 hours. Coagulation Profile: Recent Labs  Lab 07/21/19 1600 07/22/19 0358  INR 1.2 1.3*   Cardiac Enzymes: No results for input(s): CKTOTAL, CKMB, CKMBINDEX, TROPONINI in the last 168 hours. BNP (last 3 results) No results  for input(s): PROBNP in the last 8760 hours. HbA1C: No results for input(s): HGBA1C in the last 72 hours. CBG: Recent Labs  Lab 07/24/19 1126 07/24/19 1629 07/24/19 2119 07/25/19 0603 07/25/19 0757  GLUCAP 157* 104* 145* 109* 100*   Lipid Profile: No results for input(s): CHOL, HDL, LDLCALC, TRIG, CHOLHDL, LDLDIRECT in the last 72 hours. Thyroid Function Tests: No results for input(s): TSH, T4TOTAL, FREET4, T3FREE, THYROIDAB in the last 72 hours. Anemia Panel: No results for input(s): VITAMINB12, FOLATE, FERRITIN, TIBC, IRON, RETICCTPCT in the last 72 hours. Sepsis Labs: No results for input(s): PROCALCITON, LATICACIDVEN in the last 168 hours.  Recent Results (from the past 240 hour(s))  SARS Coronavirus 2 [facility] order, Performed in [facility] hospital lab) Nasopharyngeal Nasopharyngeal Swab     Status: None   Collection Time: 07/19/19 12:32 PM   Specimen: Nasopharyngeal Swab  Result Value Ref Range Status   SARS Coronavirus 2 NEGATIVE NEGATIVE Final    Comment: (NOTE) If result is NEGATIVE SARS-CoV-2 target nucleic acids are NOT DETECTED. The SARS-CoV-2 RNA is generally detectable in upper and lower  respiratory specimens during the acute phase of infection. The lowest  concentration of SARS-CoV-2 viral copies this assay can detect is 250  copies / mL. A negative result does not preclude SARS-CoV-2 infection  and should not be used as the sole basis for treatment or other  patient management decisions.  A negative result may occur with  improper specimen collection / handling, submission of specimen other  than nasopharyngeal swab, presence of viral mutation(s) within the  areas targeted by this assay, and inadequate number of viral copies  (<250 copies / mL). A negative result must be combined with clinical  observations, patient history, and epidemiological information. If result is POSITIVE SARS-CoV-2 target nucleic acids are DETECTED. The SARS-CoV-2 RNA is  generally detectable in upper and lower  respiratory specimens dur ing the acute phase of infection.  Positive  results are indicative of active infection with SARS-CoV-2.  Clinical  correlation with patient history and other diagnostic information is  necessary to determine patient infection status.  Positive results do  not rule out bacterial infection or co-infection with other viruses. If result is PRESUMPTIVE POSTIVE SARS-CoV-2 nucleic acids MAY BE PRESENT.   A presumptive positive result was obtained on the submitted specimen  and confirmed on repeat testing.  While 2019 novel coronavirus  (SARS-CoV-2) nucleic acids may be present in the submitted sample  additional confirmatory testing may be necessary for epidemiological  and / or clinical management purposes  to differentiate between  SARS-CoV-2 and other Sarbecovirus currently known to infect humans.  If clinically indicated additional testing with an alternate test  methodology [phone]) is  advised. The SARS-CoV-2 RNA is generally  detectable in upper and lower respiratory sp ecimens during the acute  phase of infection. The expected result is Negative. Fact Sheet for Patients:  StrictlyIdeas.no Fact Sheet for Healthcare Providers: BankingDealers.co.za This test is not yet approved or cleared by the [place] FDA and has been authorized for detection and/or diagnosis of SARS-CoV-2 by FDA under an Emergency Use Authorization (EUA).  This EUA will remain in effect (meaning this test can be used) for the duration of the COVID-19 declaration under Section 564(b)(1) of the Act, 21 U.S.C. section 360bbb-3(b)(1), unless the authorization is terminated or revoked sooner. Performed at [facility], [address] [place], [place] [address]   Culture, body fluid-bottle     Status: Abnormal   Collection Time: 07/19/19  4:18 PM   Specimen: Pleura  Result Value Ref Range Status    Specimen Description   Final    PLEURAL BOTTLES DRAWN AEROBIC AND ANAEROBIC Performed at [facility], [address], [place], [place] [address]    Special Requests   Final    10CC Performed at [facility], [address] [place], [place] [address]    Gram Stain   Final    GRAM POSITIVE COCCI Gram Stain Report Called to,Read Back By and Verified With: A. ROGERS @0907  08/03/2020KAY AEROBIC AND ANAEROBIC BOTTLES Performed at [facility], [address] [place], [place] [address]    Culture STREPTOCOCCUS MITIS/ORALIS (A)  Final   Report Status 07/23/2019 FINAL  Final   Organism ID, Bacteria STREPTOCOCCUS MITIS/ORALIS  Final      Susceptibility   Streptococcus mitis/oralis - MIC*    TETRACYCLINE 4 SENSITIVE Sensitive     VANCOMYCIN <=0.12 SENSITIVE Sensitive     CLINDAMYCIN <=0.25 SENSITIVE Sensitive     PENICILLIN Value in next row Sensitive      SENSITIVE<=0.06    CEFTRIAXONE Value in next row Sensitive      SENSITIVE<=0.12    * STREPTOCOCCUS MITIS/ORALIS  Gram stain     Status: None   Collection Time: 07/19/19  4:18 PM   Specimen: Pleura  Result Value Ref Range Status   Specimen Description PLEURAL  Final   Special Requests NONE  Final   Gram Stain   Final    CYTOSPIN SMEAR NO ORGANISMS SEEN WBC PRESENT,BOTH PMN AND MONONUCLEAR Performed at [facility], [address], [place], [place] [address]    Report Status 07/19/2019 FINAL  Final  Acid Fast Smear (AFB)     Status: None   Collection Time: 07/22/19  9:19 AM   Specimen: Pleural, Right; Body Fluid  Result Value Ref Range Status   AFB Specimen Processing Concentration  Final   Acid Fast Smear Negative  Final    Comment: (NOTE) Performed At: [facility] [place], [place] [id number] Rush Farmer MD NL:[id number]    Source (AFB) PLEURAL  Final    Comment: RIGHT Performed at [facility], [place] [address], [place], [place] [address]   Body fluid culture     Status: None   Collection  Time: 07/22/19  9:19 AM   Specimen: Pleura  Result Value Ref Range Status   Specimen Description PLEURAL RIGHT  Final   Special Requests PATIENT ON FOLLOWING VANC  Final   Gram Stain   Final    MODERATE WBC PRESENT,BOTH PMN AND MONONUCLEAR NO ORGANISMS SEEN    Culture   Final    NO GROWTH 3 DAYS Performed at [facility], [place] [address] [place], [place] [address]    Report Status 07/25/2019 FINAL  Final  Aerobic/Anaerobic Culture (surgical/deep wound)     Status: None (Preliminary result)   Collection Time: 07/22/19  9:27 AM   Specimen: Pleural, Right; Tissue  Result Value Ref Range Status   Specimen Description TISSUE RIGHT PLEURAL  Final   Special Requests PATIENT ON FOLLOWING VANC  Final   Gram Stain   Final    MODERATE WBC PRESENT,BOTH PMN AND MONONUCLEAR NO ORGANISMS SEEN    Culture   Final    NO GROWTH 2 DAYS NO ANAEROBES ISOLATED; CULTURE IN PROGRESS FOR 5 DAYS Performed at [facility], [place] [address] [place], [place] [address]    Report Status PENDING  Incomplete         Radiology Studies: Dg Chest Port 1 View  Result Date: 07/25/2019 CLINICAL DATA:  58 year old male with history of pleural effusion. Follow-up study. EXAM: PORTABLE CHEST 1 VIEW COMPARISON:  Chest x-ray 07/24/2019. FINDINGS: Right-sided chest tube in position with tip in the apex of the right hemithorax. Trace right apical pneumothorax occupying less than 5% of the volume of the right hemithorax. Patchy multifocal interstitial and airspace disease throughout the right mid to lower lung. Left lung is clear. Small right pleural effusion. No left pleural effusion. No evidence of pulmonary edema. Heart size appears borderline enlarged. Upper mediastinal contours are within normal limits. Aortic atherosclerosis. IMPRESSION: 1. Right-sided chest tube is stable in position with very small right hydropneumothorax. 2. Patchy airspace consolidation throughout the right mid to lower lung, similar to  the prior study. 3. Electronically Signed   By: Vinnie Langton M.D.   On: 07/25/2019 10:06   Dg Chest Port 1 View  Result Date: 07/24/2019 CLINICAL DATA:  Pleural effusion. EXAM: PORTABLE CHEST 1 VIEW COMPARISON:  07/23/2019.  07/22/2019.  CT 07/19/2019. FINDINGS: Right chest tube in stable position. Stable cardiomegaly. Persistent low lung volumes. Progressive infiltrate right lower lung. Small right pleural effusion cannot be excluded. Reference is made to prior CT report for discussion of pulmonary nodules present. IMPRESSION: 1.  Right chest tube in stable position.  No pneumothorax. 2. Low lung volumes. Progressive right lower lung infiltrate. Right pleural effusion again noted. Electronically Signed   By: Marcello Moores  Register   On: 07/24/2019 08:39   Ir Abdomen US Limited  Result Date: 07/24/2019 CLINICAL DATA:  58 year old with decompensated cirrhosis. Evaluate for ascites. EXAM: LIMITED ABDOMEN ULTRASOUND FOR ASCITES TECHNIQUE: Limited ultrasound survey for ascites was performed in all four abdominal quadrants. COMPARISON:  Chest CT 07/19/2019 FINDINGS: No significant fluid in the upper quadrants. Trace ascites in the right lower quadrant. No significant ascites in left lower quadrant. IMPRESSION: Trace ascites in the right lower quadrant. Paracentesis not performed. Electronically Signed   By: Markus Daft M.D.   On: 07/24/2019 16:33        Scheduled Meds: . acetaminophen  1,000 mg Oral Q6H   Or  . acetaminophen (TYLENOL) oral liquid 160 mg/5 mL  1,000 mg Oral Q6H  . bisacodyl  10 mg Oral Daily  . enoxaparin (LOVENOX) injection  40 mg Subcutaneous Q24H  . furosemide  40 mg Intravenous TID  . insulin aspart  0-15 Units Subcutaneous TID WC  . insulin aspart  0-5 Units Subcutaneous QHS  . insulin glargine  10 Units Subcutaneous QHS  . pantoprazole  40 mg Oral Daily  . senna-docusate  1 tablet Oral QHS  . spironolactone  200 mg  Oral Daily   Continuous Infusions: . cefTRIAXone (ROCEPHIN)   IV 2 g (07/24/19 1221)     LOS: 5 days    Time spent: 36 minutes spent on chart review, personally reviewing all imaging studies and labs, discussion with nursing staff, consultants, updating family and interview/physical exam; more than 50% of that time was spent in counseling and/or coordination of care.    Eric J [place] DO [facility] Pager [phone]  If 7PM-7AM, please contact night-coverage [email or web address] Password [facility] 07/25/2019, 10:32 AM

## 2019-07-25 NOTE — Progress Notes (Addendum)
[address]       [place] [address]             [phone]      3 Days Post-Op Procedure(s) (LRB): VIDEO ASSISTED THORACOSCOPY (VATS)/DECORTICATION (Right) Drainage Of Pleural Effusion (Right) Subjective: Drainage improving but remains significant- 1000cc out yesterday and 200 so far today  Objective: Vital signs in last 24 hours: Temp:  [97.9 F (36.6 C)-98.8 F (37.1 C)] 98.8 F (37.1 C) (08/08 0736) Pulse Rate:  [67-77] 67 (08/08 0800) Cardiac Rhythm: Normal sinus rhythm (08/08 0800) Resp:  [13-16] 16 (08/08 0736) BP: (90-129)/(52-65) 112/62 (08/08 0736) SpO2:  [92 %-96 %] 96 % (08/08 0800) Weight:  [100 kg] 100 kg (08/08 0408)  Hemodynamic parameters for last 24 hours:    Intake/Output from previous day: 08/07 0701 - 08/08 0700 In: 390 [P.O.:390] Out: 2450 [Urine:1650; Chest Tube:800] Intake/Output this shift: Total I/O In: -  Out: 400 [Urine:400]  General appearance: alert, cooperative and no distress Heart: regular rate and rhythm Lungs: mildly dim righ tbase with some crackles Abdomen: non tender , soft Extremities: no edema Wound: incis healing well  Lab Results: Recent Labs    07/24/19 0722 07/25/19 0229  WBC 5.9 4.6  HGB 11.0* 10.2*  HCT 35.5* 33.4*  PLT 114* 105*   BMET:  Recent Labs    07/24/19 0722 07/25/19 0229  NA 134* 136  K 4.0 3.7  CL 103 104  CO2 22 23  GLUCOSE 118* 108*  BUN 16 13  CREATININE 0.80 0.69  CALCIUM 8.5* 8.4*    PT/INR: No results for input(s): LABPROT, INR in the last 72 hours. ABG    Component Value Date/Time   PHART 7.391 07/23/2019 0540   HCO3 23.8 07/23/2019 0540   ACIDBASEDEF 0.5 07/23/2019 0540   O2SAT 97.8 07/23/2019 0540   CBG (last 3)  Recent Labs    07/24/19 2119 07/25/19 0603 07/25/19 0757  GLUCAP 145* 109* 100*    Meds Scheduled Meds: . acetaminophen  1,000 mg Oral Q6H   Or  . acetaminophen (TYLENOL) oral liquid 160 mg/5 mL  1,000 mg Oral Q6H  . bisacodyl   10 mg Oral Daily  . enoxaparin (LOVENOX) injection  40 mg Subcutaneous Q24H  . furosemide  40 mg Intravenous TID  . insulin aspart  0-15 Units Subcutaneous TID WC  . insulin aspart  0-5 Units Subcutaneous QHS  . insulin glargine  10 Units Subcutaneous QHS  . pantoprazole  40 mg Oral Daily  . senna-docusate  1 tablet Oral QHS  . spironolactone  200 mg Oral Daily   Continuous Infusions: . cefTRIAXone (ROCEPHIN)  IV 2 g (07/24/19 1221)   PRN Meds:.ketorolac, methocarbamol, ondansetron (ZOFRAN) IV, traMADol  Xrays Dg Chest Port 1 View  Result Date: 07/24/2019 CLINICAL DATA:  Pleural effusion. EXAM: PORTABLE CHEST 1 VIEW COMPARISON:  07/23/2019.  07/22/2019.  CT 07/19/2019. FINDINGS: Right chest tube in stable position. Stable cardiomegaly. Persistent low lung volumes. Progressive infiltrate right lower lung. Small right pleural effusion cannot be excluded. Reference is made to prior CT report for discussion of pulmonary nodules present. IMPRESSION: 1.  Right chest tube in stable position.  No pneumothorax. 2. Low lung volumes. Progressive right lower lung infiltrate. Right pleural effusion again noted. Electronically Signed   By: Marcello Moores  Register   On: 07/24/2019 08:39   Ir Abdomen US Limited  Result Date: 07/24/2019 CLINICAL DATA:  58 year old with decompensated cirrhosis. Evaluate for ascites. EXAM: LIMITED  ABDOMEN ULTRASOUND FOR ASCITES TECHNIQUE: Limited ultrasound survey for ascites was performed in all four abdominal quadrants. COMPARISON:  Chest CT 07/19/2019 FINDINGS: No significant fluid in the upper quadrants. Trace ascites in the right lower quadrant. No significant ascites in left lower quadrant. IMPRESSION: Trace ascites in the right lower quadrant. Paracentesis not performed. Electronically Signed   By: Markus Daft M.D.   On: 07/24/2019 16:33   Results for orders placed or performed during the hospital encounter of 07/19/19  SARS Coronavirus 2 [facility] order, Performed in [facility] hospital lab) Nasopharyngeal Nasopharyngeal Swab     Status: None   Collection Time: 07/19/19 12:32 PM   Specimen: Nasopharyngeal Swab  Result Value Ref Range Status   SARS Coronavirus 2 NEGATIVE NEGATIVE Final    Comment: (NOTE) If result is NEGATIVE SARS-CoV-2 target nucleic acids are NOT DETECTED. The SARS-CoV-2 RNA is generally detectable in upper and lower  respiratory specimens during the acute phase of infection. The lowest  concentration of SARS-CoV-2 viral copies this assay can detect is 250  copies / mL. A negative result does not preclude SARS-CoV-2 infection  and should not be used as the sole basis for treatment or other  patient management decisions.  A negative result may occur with  improper specimen collection / handling, submission of specimen other  than nasopharyngeal swab, presence of viral mutation(s) within the  areas targeted by this assay, and inadequate number of viral copies  (<250 copies / mL). A negative result must be combined with clinical  observations, patient history, and epidemiological information. If result is POSITIVE SARS-CoV-2 target nucleic acids are DETECTED. The SARS-CoV-2 RNA is generally detectable in upper and lower  respiratory specimens dur ing the acute phase of infection.  Positive  results are indicative of active infection with SARS-CoV-2.  Clinical  correlation with patient history and other diagnostic information is  necessary to determine patient infection status.  Positive results do  not rule out bacterial infection or co-infection with other viruses. If result is PRESUMPTIVE POSTIVE SARS-CoV-2 nucleic acids MAY BE PRESENT.   A presumptive positive result was obtained on the submitted specimen  and confirmed on repeat testing.  While 2019 novel coronavirus  (SARS-CoV-2) nucleic acids may be present in the submitted sample  additional confirmatory testing may be necessary for epidemiological  and / or clinical management  purposes  to differentiate between  SARS-CoV-2 and other Sarbecovirus currently known to infect humans.  If clinically indicated additional testing with an alternate test  methodology [id number]) is advised. The SARS-CoV-2 RNA is generally  detectable in upper and lower respiratory sp ecimens during the acute  phase of infection. The expected result is Negative. Fact Sheet for Patients:  StrictlyIdeas.no Fact Sheet for Healthcare Providers: BankingDealers.co.za This test is not yet approved or cleared by the [place] FDA and has been authorized for detection and/or diagnosis of SARS-CoV-2 by FDA under an Emergency Use Authorization (EUA).  This EUA will remain in effect (meaning this test can be used) for the duration of the COVID-19 declaration under Section 564(b)(1) of the Act, 21 U.S.C. section 360bbb-3(b)(1), unless the authorization is terminated or revoked sooner. Performed at [facility], [address], [place], [place] [address]   Culture, body fluid-bottle     Status: Abnormal   Collection Time: 07/19/19  4:18 PM   Specimen: Pleura  Result Value Ref Range Status   Specimen Description   Final    PLEURAL BOTTLES DRAWN AEROBIC AND ANAEROBIC Performed at  [facility], [address], [place], [place] [address]    Special Requests   Final    10CC Performed at [facility], [address], [place], [place] [address]    Gram Stain   Final    GRAM POSITIVE COCCI Gram Stain Report Called to,Read Back By and Verified With: A. ROGERS @0907  08/03/2020KAY AEROBIC AND ANAEROBIC BOTTLES Performed at [facility], [address], [place], [place] [address]    Culture STREPTOCOCCUS MITIS/ORALIS (A)  Final   Report Status 07/23/2019 FINAL  Final   Organism ID, Bacteria STREPTOCOCCUS MITIS/ORALIS  Final      Susceptibility   Streptococcus mitis/oralis - MIC*    TETRACYCLINE 4 SENSITIVE Sensitive     VANCOMYCIN <=0.12 SENSITIVE  Sensitive     CLINDAMYCIN <=0.25 SENSITIVE Sensitive     PENICILLIN Value in next row Sensitive      SENSITIVE<=0.06    CEFTRIAXONE Value in next row Sensitive      SENSITIVE<=0.12    * STREPTOCOCCUS MITIS/ORALIS  Gram stain     Status: None   Collection Time: 07/19/19  4:18 PM   Specimen: Pleura  Result Value Ref Range Status   Specimen Description PLEURAL  Final   Special Requests NONE  Final   Gram Stain   Final    CYTOSPIN SMEAR NO ORGANISMS SEEN WBC PRESENT,BOTH PMN AND MONONUCLEAR Performed at [facility], [address] [place], [place] [address]    Report Status 07/19/2019 FINAL  Final  Acid Fast Smear (AFB)     Status: None   Collection Time: 07/22/19  9:19 AM   Specimen: Pleural, Right; Body Fluid  Result Value Ref Range Status   AFB Specimen Processing Concentration  Final   Acid Fast Smear Negative  Final    Comment: (NOTE) Performed At: [facility] [place], [place] [id number] Rush Farmer MD FX:[id number]    Source (AFB) PLEURAL  Final    Comment: RIGHT Performed at [facility], [place] [address] [place], [place] [address]   Body fluid culture     Status: None (Preliminary result)   Collection Time: 07/22/19  9:19 AM   Specimen: Pleura  Result Value Ref Range Status   Specimen Description PLEURAL RIGHT  Final   Special Requests PATIENT ON FOLLOWING VANC  Final   Gram Stain   Final    MODERATE WBC PRESENT,BOTH PMN AND MONONUCLEAR NO ORGANISMS SEEN    Culture   Final    NO GROWTH 2 DAYS Performed at [facility], [place] [address], [place], [place] [address]    Report Status PENDING  Incomplete  Aerobic/Anaerobic Culture (surgical/deep wound)     Status: None (Preliminary result)   Collection Time: 07/22/19  9:27 AM   Specimen: Pleural, Right; Tissue  Result Value Ref Range Status   Specimen Description TISSUE RIGHT PLEURAL  Final   Special Requests PATIENT ON FOLLOWING VANC  Final   Gram Stain   Final    MODERATE  WBC PRESENT,BOTH PMN AND MONONUCLEAR NO ORGANISMS SEEN    Culture   Final    NO GROWTH 2 DAYS NO ANAEROBES ISOLATED; CULTURE IN PROGRESS FOR 5 DAYS Performed at [facility], [place] [address], [place], [place] [address]    Report Status PENDING  Incomplete   Assessment/Plan: S/P Procedure(s) (LRB): VIDEO ASSISTED THORACOSCOPY (VATS)/DECORTICATION (Right) Drainage Of Pleural Effusion (Right)  1 hemodyn stable in sinus rhythm, no fevers 2 sats good on RA 3 CXR  stable appearance 4 CT drainage  As above, no air leak, continue tube for now 5 CX strep oralis/mitis, sens to rocephin 6 medical management/diuretics as per medicine svc  LOS: 5 days    Lawrence Rogers [facility] 07/25/2019 Pager [phone]  Chest tube output trending down Will remove drain once < 339ms over 24hrs

## 2019-07-26 ENCOUNTER — Inpatient Hospital Stay (HOSPITAL_COMMUNITY): Payer: BC Managed Care – PPO

## 2019-07-26 LAB — GLUCOSE, CAPILLARY
Glucose-Capillary: 116 mg/dL — ABNORMAL HIGH (ref 70–99)
Glucose-Capillary: 116 mg/dL — ABNORMAL HIGH (ref 70–99)
Glucose-Capillary: 152 mg/dL — ABNORMAL HIGH (ref 70–99)
Glucose-Capillary: 187 mg/dL — ABNORMAL HIGH (ref 70–99)
Glucose-Capillary: 135 mg/dL — ABNORMAL HIGH (ref 70–99)

## 2019-07-26 LAB — BASIC METABOLIC PANEL
Anion gap: 11 (ref 5–15)
BUN: 14 mg/dL (ref 6–20)
CO2: 26 mmol/L (ref 22–32)
Calcium: 8.8 mg/dL — ABNORMAL LOW (ref 8.9–10.3)
Chloride: 101 mmol/L (ref 98–111)
Creatinine, Ser: 0.84 mg/dL (ref 0.61–1.24)
GFR calc Af Amer: >60 mL/min (ref >60)
GFR calc non Af Amer: >60 mL/min (ref >60)
Glucose, Bld: 126 mg/dL — ABNORMAL HIGH (ref 70–99)
Potassium: 3.6 mmol/L (ref 3.5–5.1)
Sodium: 138 mmol/L (ref 135–145)

## 2019-07-26 LAB — MAGNESIUM: Magnesium: 1.8 mg/dL (ref 1.7–2.4)

## 2019-07-26 MED ORDER — MAGNESIUM SULFATE 2 GM/50ML IV SOLN
2.0000 g | Freq: Once | INTRAVENOUS | Status: AC
  Administered 2019-07-26: 2 g via INTRAVENOUS
  Filled 2019-07-26: qty 50

## 2019-07-26 MED ORDER — POTASSIUM CHLORIDE CRYS ER 20 MEQ PO TBCR
40.0000 meq | EXTENDED_RELEASE_TABLET | Freq: Once | ORAL | Status: AC
  Administered 2019-07-26: 40 meq via ORAL
  Filled 2019-07-26: qty 2

## 2019-07-26 NOTE — Progress Notes (Signed)
PROGRESS NOTE    Lawrence Rogers  YIA:[id number] DOB: 07-22-1961 DOA: 07/19/2019 PCP: [name], MD    Brief Narrative:   58 year old male with a history of NASH Cirrhosis, diabetes mellitus type 2, and GERD presenting with 1 month history of shortness of breath and dyspnea on exertion.  He stated that his breathing and dyspnea on exertion got significantly worse in the 24 hours prior to admission.  In addition, he began developing right-sided pleuritic chest pain.  He had denied any fevers, chills, nausea, vomiting, diarrhea, abdominal pain, headache, coughing, hemoptysis.  In the emergency department, the patient had a low-grade temperature of 99.6 F but remained hemodynamically stable.  He required supplemental oxygen at 3 L for his hypoxia.  WBC was 7.6 at the time of admission.  CT angiogram of the chest showed a large right-sided pleural effusion with complete collapse of the right lower lobe.  There is also some scattered left-sided lung nodules.  There was no pulmonary embolus.  Thoracocentesis was performed in the emergency department draining 1 L of fluid. Pleural fluid studies show an exudate with WBC 22,531.  Preliminary Gram stain shows gram-positive cocci.  The patient was started on vancomycin and ceftriaxone.    Transferred to [facility] on 07/20/2019 for thoracic surgery consultation for empyema and possible decortication.   Assessment & Plan:   Principal Problem:   Empyema lung ([place]) Active Problems:   Diabetes ([place])   Cirrhosis of liver with ascites (HCC)   Acute respiratory distress   Pleural effusion   Acute respiratory failure with hypoxia (HCC)   Empyema (HCC)  Acute hypoxic respiratory failure Right large pleural effusion with empyema Streptococcus mitis/oralis Patient presenting with progressive shortness of breath and right-sided pleuritic chest pain.  CT angiogram chest notable for a large right-sided pleural effusion with complete collapse of the right  lower lobe; negative for pulmonary embolism.  Patient underwent thoracentesis by EDP at any [facility] with removal of 1 L fluid with pleural fluid studies notable for WBC count of 22,531 and Gram stain showing gram-positive cocci.  Effusion consistent with exudate/empyema. --Cardiothoracic surgery following, appreciate assistance --Pleural Fluid culture 07/19/2019 positive for Streptococcus mitis/oralis --s/p R VATS with decortication and intercostal nerve block on 07/22/2019 --Intraoperative pleural fluid/tissue culture on 07/22/2019: no growth x 2 days --chest tube placed to water seal per CTS --CT w/ 1650-->800-->568m out past 24h --Continue antibiotics with ceftriaxone --CXR 8/9; continues with small right apical PTX, personally reviewed --continue chest tube until output<3072mand considering bedside chemical pleurodesis soon per CTS  Hx NASH cirrhosis with ascites Patient with mild abdominal distention and lower extremity edema. MELD score on admission 11 correlating with a 6% estimated 3-35-monthrtality.  On furosemide 80 mg p.o. daily and spironolactone 200 mg p.o. daily at home. --net negative 2.4L past 24h and net negative 6.0L this hospitalization --continue furosemide to 40 mg IV TID --continue spironolactone 200 mg p.o. daily --Sodium restricted diet, fluid restrict 2000 mL's/day --IR consulted for therapeutic paracentesis; but not able 2/2 insufficient fluid present in abdomen --Strict I's and O's and daily weights --monitor renal funtion daily with aggressive diuresis  Type 2 diabetes mellitus Hemoglobin A1c 7.0 on 07/19/2019.  Home regimen includes glipizide 5 mg p.o. daily, metformin 1000 mg p.o. twice daily. 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  Platelet count 105.  Continues on Lovenox for DVT prophylaxis. --Continue to monitor platelet count closely   DVT prophylaxis: Lovenox; continue to monitor platelet count closely Code Status: Full code Family Communication: None Disposition Plan: Continue inpatient hospitalization, further dependent on clinical course, anticipate discharge home when medically ready   Consultants:   Cardiothoracic surgery - Dr. Kipp Brood  Procedures:   Thoracentesis by [place] 8/2 w/ 1L removed  R VATS with decortication and intercostal nerve block on 07/22/2019 by Dr. Kipp Brood  Attempt Paracentesis 07/24/2019; not enough abdominal fluid per IR  Antimicrobials:   Vancomycin 8/2 - 8/6  Ceftriaxone 8/2>>   Subjective: Patient seen and examined at bedside, resting comfortably.  Continues with good urinary output.  No complaints or concerns at this time.  CT continues to drain, but less today. CTS planning possible bedside chemical pleurodesis soon.  Denies headache, no fever/chills/night sweats, no nausea vomiting/diarrhea, no chest pain, no palpitations, no abdominal pain, no weakness, no issues with bowel/bladder function, no paresthesias.  No acute events overnight per nursing staff.  Objective: Vitals:   07/25/19 1945 07/26/19 0007 07/26/19 0607 07/26/19 0800  BP: (!) 119/53 (!) 120/58 (!) 108/52   Pulse: 88 72 70 69  Resp: 18 20 20    Temp: 98.8 F (37.1 C) 98.2 F (36.8 C) 98.1 F (36.7 C)   TempSrc: Oral Oral Oral   SpO2: 96% 90% 94% 94%  Weight:   94 kg   Height:        Intake/Output Summary (Last 24 hours) at 07/26/2019 1104 Last data filed at 07/26/2019 1018 Gross per 24 hour  Intake 360 ml  Output 2975 ml  Net -2615 ml   Filed Weights   07/24/19 0404 07/25/19 0408 07/26/19 0607  Weight: 102.4 kg 100 kg 94 kg    Examination:  General exam: Appears calm and comfortable  Respiratory system: Breath sounds slightly decreased right base, normal respiratory effort, no crackles/wheezing,  oxygenating well on room air, chest tube site noted, currently on waterseal Cardiovascular system: S1 & S2 heard, RRR. No JVD, murmurs, rubs, gallops or clicks.  1+ pitting edema bilateral lower extremities to mid shin Gastrointestinal system: Abdomen is nondistended, soft and nontender. No organomegaly or masses felt. Normal bowel sounds heard. Central nervous system: Alert and oriented. No focal neurological deficits. Extremities: Symmetric 5 x 5 power.  1+ pitting edema bilateral lower extremities to mid shin Skin: No rashes, lesions or ulcers Psychiatry: Judgement and insight appear normal. Mood & affect appropriate.     Data Reviewed: I have personally reviewed following labs and imaging studies  CBC: Recent Labs  Lab 07/19/19 1231  07/21/19 1149 07/22/19 0358 07/23/19 0535 07/24/19 0722 07/25/19 0229  WBC 7.6   < > 5.7 5.2 5.8 5.9 4.6  NEUTROABS 6.7  --   --   --   --   --   --   HGB 12.0*   < > 11.1* 10.7* 9.7* 11.0* 10.2*  HCT 40.0   < > 35.7* 34.4* 31.5* 35.5* 33.4*  MCV 84.4   < > 81.7 82.1 83.3 82.9 82.7  PLT 92*   < > 87* 88* 93* 114* 105*   < > = values in this interval not displayed.   Basic Metabolic Panel: Recent Labs  Lab 07/22/19 0358 07/23/19 0535 07/24/19 0722 07/25/19 0229 07/26/19 0253  NA 135 134* 134* 136 138  K 3.7 4.4 4.0 3.7 3.6  CL 101 103 103 104 101  CO2 25 22 22 23  26  GLUCOSE 110* 219* 118* 108* 126*  BUN 9 15 16 13 14   CREATININE 0.62 0.75 0.80 0.69 0.84  CALCIUM 8.5* 8.5* 8.5* 8.4* 8.8*  MG 2.0 2.0  --   --  1.8   GFR: Estimated Creatinine Clearance: 109.7 mL/min (by C-G formula based on SCr of 0.84 mg/dL). Liver Function Tests: Recent Labs  Lab 07/19/19 1231 07/20/19 0633 07/21/19 1149 07/22/19 0358 07/24/19 0722  AST 70* 47* 38 33 44*  ALT 48* 39 36 31 37  ALKPHOS 117 84 91 84 108  BILITOT 2.1* 2.6* 1.6* 1.5* 1.5*  PROT 6.4* 5.3* 5.5* 5.0* 4.9*  ALBUMIN 3.7 3.0* 2.8* 2.6* 2.6*   Recent Labs  Lab 07/19/19 1237   LIPASE 24   No results for input(s): AMMONIA in the last 168 hours. Coagulation Profile: Recent Labs  Lab 07/21/19 1600 07/22/19 0358  INR 1.2 1.3*   Cardiac Enzymes: No results for input(s): CKTOTAL, CKMB, CKMBINDEX, TROPONINI in the last 168 hours. BNP (last 3 results) No results for input(s): PROBNP in the last 8760 hours. HbA1C: No results for input(s): HGBA1C in the last 72 hours. CBG: Recent Labs  Lab 07/25/19 1136 07/25/19 1641 07/25/19 2054 07/26/19 0645 07/26/19 0750  GLUCAP 89 202* 120* 116* 116*   Lipid Profile: No results for input(s): CHOL, HDL, LDLCALC, TRIG, CHOLHDL, LDLDIRECT in the last 72 hours. Thyroid Function Tests: No results for input(s): TSH, T4TOTAL, FREET4, T3FREE, THYROIDAB in the last 72 hours. Anemia Panel: No results for input(s): VITAMINB12, FOLATE, FERRITIN, TIBC, IRON, RETICCTPCT in the last 72 hours. Sepsis Labs: No results for input(s): PROCALCITON, LATICACIDVEN in the last 168 hours.  Recent Results (from the past 240 hour(s))  SARS Coronavirus 2 [facility] order, Performed in [facility] hospital lab) Nasopharyngeal Nasopharyngeal Swab     Status: None   Collection Time: 07/19/19 12:32 PM   Specimen: Nasopharyngeal Swab  Result Value Ref Range Status   SARS Coronavirus 2 NEGATIVE NEGATIVE Final    Comment: (NOTE) If result is NEGATIVE SARS-CoV-2 target nucleic acids are NOT DETECTED. The SARS-CoV-2 RNA is generally detectable in upper and lower  respiratory specimens during the acute phase of infection. The lowest  concentration of SARS-CoV-2 viral copies this assay can detect is 250  copies / mL. A negative result does not preclude SARS-CoV-2 infection  and should not be used as the sole basis for treatment or other  patient management decisions.  A negative result may occur with  improper specimen collection / handling, submission of specimen other  than nasopharyngeal swab, presence of viral mutation(s) within the  areas  targeted by this assay, and inadequate number of viral copies  (<250 copies / mL). A negative result must be combined with clinical  observations, patient history, and epidemiological information. If result is POSITIVE SARS-CoV-2 target nucleic acids are DETECTED. The SARS-CoV-2 RNA is generally detectable in upper and lower  respiratory specimens dur ing the acute phase of infection.  Positive  results are indicative of active infection with SARS-CoV-2.  Clinical  correlation with patient history and other diagnostic information is  necessary to determine patient infection status.  Positive results do  not rule out bacterial infection or co-infection with other viruses. If result is PRESUMPTIVE POSTIVE SARS-CoV-2 nucleic acids MAY BE PRESENT.   A presumptive positive result was obtained on the submitted specimen  and confirmed on repeat testing.  While 2019 novel coronavirus  (SARS-CoV-2) nucleic acids may be present in the submitted sample  additional confirmatory testing  may be necessary for epidemiological  and / or clinical management purposes  to differentiate between  SARS-CoV-2 and other Sarbecovirus currently known to infect humans.  If clinically indicated additional testing with an alternate test  methodology [phone]) is advised. The SARS-CoV-2 RNA is generally  detectable in upper and lower respiratory sp ecimens during the acute  phase of infection. The expected result is Negative. Fact Sheet for Patients:  StrictlyIdeas.no Fact Sheet for Healthcare Providers: BankingDealers.co.za This test is not yet approved or cleared by the [place] FDA and has been authorized for detection and/or diagnosis of SARS-CoV-2 by FDA under an Emergency Use Authorization (EUA).  This EUA will remain in effect (meaning this test can be used) for the duration of the COVID-19 declaration under Section 564(b)(1) of the Act, 21 U.S.C. section  360bbb-3(b)(1), unless the authorization is terminated or revoked sooner. Performed at [facility], [address] [place], [place] [address]   Culture, body fluid-bottle     Status: Abnormal   Collection Time: 07/19/19  4:18 PM   Specimen: Pleura  Result Value Ref Range Status   Specimen Description   Final    PLEURAL BOTTLES DRAWN AEROBIC AND ANAEROBIC Performed at [facility], [address], [place], [place] [address]    Special Requests   Final    10CC Performed at [facility], [address], [place], [place] [address]    Gram Stain   Final    GRAM POSITIVE COCCI Gram Stain Report Called to,Read Back By and Verified With: A. ROGERS @0907  08/03/2020KAY AEROBIC AND ANAEROBIC BOTTLES Performed at [facility], [address], [place], [place] [address]    Culture STREPTOCOCCUS MITIS/ORALIS (A)  Final   Report Status 07/23/2019 FINAL  Final   Organism ID, Bacteria STREPTOCOCCUS MITIS/ORALIS  Final      Susceptibility   Streptococcus mitis/oralis - MIC*    TETRACYCLINE 4 SENSITIVE Sensitive     VANCOMYCIN <=0.12 SENSITIVE Sensitive     CLINDAMYCIN <=0.25 SENSITIVE Sensitive     PENICILLIN Value in next row Sensitive      SENSITIVE<=0.06    CEFTRIAXONE Value in next row Sensitive      SENSITIVE<=0.12    * STREPTOCOCCUS MITIS/ORALIS  Gram stain     Status: None   Collection Time: 07/19/19  4:18 PM   Specimen: Pleura  Result Value Ref Range Status   Specimen Description PLEURAL  Final   Special Requests NONE  Final   Gram Stain   Final    CYTOSPIN SMEAR NO ORGANISMS SEEN WBC PRESENT,BOTH PMN AND MONONUCLEAR Performed at [facility], [address] [place], [place] [address]    Report Status 07/19/2019 FINAL  Final  Acid Fast Smear (AFB)     Status: None   Collection Time: 07/22/19  9:19 AM   Specimen: Pleural, Right; Body Fluid  Result Value Ref Range Status   AFB Specimen Processing Concentration  Final   Acid Fast Smear Negative  Final    Comment: (NOTE)  Performed At: [facility] [place], [place] [id number] Rush Farmer MD [id number]    Source (AFB) PLEURAL  Final    Comment: RIGHT Performed at [facility], [place] [address] [place], [place] [address]   Body fluid culture     Status: None   Collection Time: 07/22/19  9:19 AM   Specimen: Pleura  Result Value Ref Range Status   Specimen Description PLEURAL RIGHT  Final   Special Requests PATIENT ON FOLLOWING  VANC  Final   Gram Stain   Final    MODERATE WBC PRESENT,BOTH PMN AND MONONUCLEAR NO ORGANISMS SEEN    Culture   Final    NO GROWTH 3 DAYS Performed at [facility], [address], [place], [place] [address]    Report Status 07/25/2019 FINAL  Final  Aerobic/Anaerobic Culture (surgical/deep wound)     Status: None (Preliminary result)   Collection Time: 07/22/19  9:27 AM   Specimen: Pleural, Right; Tissue  Result Value Ref Range Status   Specimen Description TISSUE RIGHT PLEURAL  Final   Special Requests PATIENT ON FOLLOWING VANC  Final   Gram Stain   Final    MODERATE WBC PRESENT,BOTH PMN AND MONONUCLEAR NO ORGANISMS SEEN    Culture   Final    NO GROWTH 3 DAYS NO ANAEROBES ISOLATED; CULTURE IN PROGRESS FOR 5 DAYS Performed at [facility], [place] [address] [place], [place] [address]    Report Status PENDING  Incomplete         Radiology Studies: Dg Chest Port 1 View  Result Date: 07/26/2019 CLINICAL DATA:  Postsurgical follow-up EXAM: PORTABLE CHEST 1 VIEW COMPARISON:  Chest radiograph from one day prior. FINDINGS: Stable right apical chest tube position. Stable cardiomediastinal silhouette with top-normal heart size. Small right apical pneumothorax is stable, proximally a 5%. No left pneumothorax. No pleural effusion. Stable vascular crowding without overt pulmonary edema. Mild bibasilar atelectasis, stable. IMPRESSION: 1. Stable small right apical pneumothorax with right chest tube in place. 2. Stable mild bibasilar  atelectasis. Electronically Signed   By: Ilona Sorrel M.D.   On: 07/26/2019 06:52   Dg Chest Port 1 View  Result Date: 07/25/2019 CLINICAL DATA:  58 year old male with history of pleural effusion. Follow-up study. EXAM: PORTABLE CHEST 1 VIEW COMPARISON:  Chest x-ray 07/24/2019. FINDINGS: Right-sided chest tube in position with tip in the apex of the right hemithorax. Trace right apical pneumothorax occupying less than 5% of the volume of the right hemithorax. Patchy multifocal interstitial and airspace disease throughout the right mid to lower lung. Left lung is clear. Small right pleural effusion. No left pleural effusion. No evidence of pulmonary edema. Heart size appears borderline enlarged. Upper mediastinal contours are within normal limits. Aortic atherosclerosis. IMPRESSION: 1. Right-sided chest tube is stable in position with very small right hydropneumothorax. 2. Patchy airspace consolidation throughout the right mid to lower lung, similar to the prior study. 3. Electronically Signed   By: Vinnie Langton M.D.   On: 07/25/2019 10:06   Ir Abdomen US Limited  Result Date: 07/24/2019 CLINICAL DATA:  58 year old with decompensated cirrhosis. Evaluate for ascites. EXAM: LIMITED ABDOMEN ULTRASOUND FOR ASCITES TECHNIQUE: Limited ultrasound survey for ascites was performed in all four abdominal quadrants. COMPARISON:  Chest CT 07/19/2019 FINDINGS: No significant fluid in the upper quadrants. Trace ascites in the right lower quadrant. No significant ascites in left lower quadrant. IMPRESSION: Trace ascites in the right lower quadrant. Paracentesis not performed. Electronically Signed   By: Markus Daft M.D.   On: 07/24/2019 16:33        Scheduled Meds: . acetaminophen  1,000 mg Oral Q6H   Or  . acetaminophen (TYLENOL) oral liquid 160 mg/5 mL  1,000 mg Oral Q6H  . bisacodyl  10 mg Oral Daily  . enoxaparin (LOVENOX) injection  40 mg Subcutaneous Q24H  . furosemide  40 mg Intravenous TID  . insulin  aspart  0-15 Units Subcutaneous TID WC  . insulin aspart  0-5 Units Subcutaneous QHS  . insulin glargine  10 Units Subcutaneous QHS  . pantoprazole  40 mg Oral Daily  . senna-docusate  1 tablet Oral QHS  . spironolactone  200 mg Oral Daily   Continuous Infusions: . cefTRIAXone (ROCEPHIN)  IV 2 g (07/25/19 1055)     LOS: 6 days    Time spent: 36 minutes spent on chart review, personally reviewing all imaging studies and labs, discussion with nursing staff, consultants, updating family and interview/physical exam; more than 50% of that time was spent in counseling and/or coordination of care.    Lawrence Sievert J [place] DO [facility] Pager [phone]  If 7PM-7AM, please contact night-coverage [email or web address] Password [email or web address] 07/26/2019, 11:04 AM

## 2019-07-26 NOTE — Progress Notes (Addendum)
[address]       [place] [address]             [phone]      4 Days Post-Op Procedure(s) (LRB): VIDEO ASSISTED THORACOSCOPY (VATS)/DECORTICATION (Right) Drainage Of Pleural Effusion (Right) Subjective: conts with significant drainage   Objective: Vital signs in last 24 hours: Temp:  [98.1 F (36.7 C)-98.8 F (37.1 C)] 98.1 F (36.7 C) (08/09 0607) Pulse Rate:  [70-88] 70 (08/09 0607) Cardiac Rhythm: Normal sinus rhythm (08/08 2100) Resp:  [14-20] 20 (08/09 0607) BP: (108-129)/(52-66) 108/52 (08/09 0607) SpO2:  [90 %-96 %] 94 % (08/09 0607) Weight:  [94 kg] 94 kg (08/09 0607)  Hemodynamic parameters for last 24 hours:    Intake/Output from previous day: 08/08 0701 - 08/09 0700 In: 600 [P.O.:600] Out: 3275 [Urine:2475; Chest Tube:800] Intake/Output this shift: No intake/output data recorded.  General appearance: alert, cooperative and no distress Heart: regular rate and rhythm Lungs: mildly dim right base Abdomen: benign Extremities: no edema Wound: incis healing well  Lab Results: Recent Labs    07/24/19 0722 07/25/19 0229  WBC 5.9 4.6  HGB 11.0* 10.2*  HCT 35.5* 33.4*  PLT 114* 105*   BMET:  Recent Labs    07/25/19 0229 07/26/19 0253  NA 136 138  K 3.7 3.6  CL 104 101  CO2 23 26  GLUCOSE 108* 126*  BUN 13 14  CREATININE 0.69 0.84  CALCIUM 8.4* 8.8*    PT/INR: No results for input(s): LABPROT, INR in the last 72 hours. ABG    Component Value Date/Time   PHART 7.391 07/23/2019 0540   HCO3 23.8 07/23/2019 0540   ACIDBASEDEF 0.5 07/23/2019 0540   O2SAT 97.8 07/23/2019 0540   CBG (last 3)  Recent Labs    07/25/19 2054 07/26/19 0645 07/26/19 0750  GLUCAP 120* 116* 116*    Meds Scheduled Meds:  acetaminophen  1,000 mg Oral Q6H   Or   acetaminophen (TYLENOL) oral liquid 160 mg/5 mL  1,000 mg Oral Q6H   bisacodyl  10 mg Oral Daily   enoxaparin (LOVENOX) injection  40 mg Subcutaneous Q24H    furosemide  40 mg Intravenous TID   insulin aspart  0-15 Units Subcutaneous TID WC   insulin aspart  0-5 Units Subcutaneous QHS   insulin glargine  10 Units Subcutaneous QHS   pantoprazole  40 mg Oral Daily   senna-docusate  1 tablet Oral QHS   spironolactone  200 mg Oral Daily   Continuous Infusions:  cefTRIAXone (ROCEPHIN)  IV 2 g (07/25/19 1055)   magnesium sulfate bolus IVPB 2 g (07/26/19 0820)   PRN Meds:.ketorolac, methocarbamol, ondansetron (ZOFRAN) IV, traMADol  Xrays Dg Chest Port 1 View  Result Date: 07/26/2019 CLINICAL DATA:  Postsurgical follow-up EXAM: PORTABLE CHEST 1 VIEW COMPARISON:  Chest radiograph from one day prior. FINDINGS: Stable right apical chest tube position. Stable cardiomediastinal silhouette with top-normal heart size. Small right apical pneumothorax is stable, proximally a 5%. No left pneumothorax. No pleural effusion. Stable vascular crowding without overt pulmonary edema. Mild bibasilar atelectasis, stable. IMPRESSION: 1. Stable small right apical pneumothorax with right chest tube in place. 2. Stable mild bibasilar atelectasis. Electronically Signed   By: Ilona Sorrel M.D.   On: 07/26/2019 06:52   Dg Chest Port 1 View  Result Date: 07/25/2019 CLINICAL DATA:  58 year old male with history of pleural effusion. Follow-up study. EXAM: PORTABLE CHEST 1 VIEW COMPARISON:  Chest x-ray 07/24/2019. FINDINGS: Right-sided  chest tube in position with tip in the apex of the right hemithorax. Trace right apical pneumothorax occupying less than 5% of the volume of the right hemithorax. Patchy multifocal interstitial and airspace disease throughout the right mid to lower lung. Left lung is clear. Small right pleural effusion. No left pleural effusion. No evidence of pulmonary edema. Heart size appears borderline enlarged. Upper mediastinal contours are within normal limits. Aortic atherosclerosis. IMPRESSION: 1. Right-sided chest tube is stable in position with very small  right hydropneumothorax. 2. Patchy airspace consolidation throughout the right mid to lower lung, similar to the prior study. 3. Electronically Signed   By: Vinnie Langton M.D.   On: 07/25/2019 10:06   Ir Abdomen US Limited  Result Date: 07/24/2019 CLINICAL DATA:  58 year old with decompensated cirrhosis. Evaluate for ascites. EXAM: LIMITED ABDOMEN ULTRASOUND FOR ASCITES TECHNIQUE: Limited ultrasound survey for ascites was performed in all four abdominal quadrants. COMPARISON:  Chest CT 07/19/2019 FINDINGS: No significant fluid in the upper quadrants. Trace ascites in the right lower quadrant. No significant ascites in left lower quadrant. IMPRESSION: Trace ascites in the right lower quadrant. Paracentesis not performed. Electronically Signed   By: Markus Daft M.D.   On: 07/24/2019 16:33    Assessment/Plan: S/P Procedure(s) (LRB): VIDEO ASSISTED THORACOSCOPY (VATS)/DECORTICATION (Right) Drainage Of Pleural Effusion (Right)  1 remains stable with significant drainage but improving trend, leave tube till <336m 2 hemodyn stable  3 sats good on RA 4 conts med management/ABX as per primary service  LOS: 6 days    WJohn GiovanniPResolute Health8/08/2019 Pager [phone]  Chest tube output remains high Will consider bedside chemical pleurodesis soon Hetty Linhart O Valaree Fresquez

## 2019-07-27 LAB — BASIC METABOLIC PANEL
Anion gap: 10 (ref 5–15)
BUN: 12 mg/dL (ref 6–20)
CO2: 24 mmol/L (ref 22–32)
Calcium: 8.7 mg/dL — ABNORMAL LOW (ref 8.9–10.3)
Chloride: 101 mmol/L (ref 98–111)
Creatinine, Ser: 0.82 mg/dL (ref 0.61–1.24)
GFR calc Af Amer: >60 mL/min (ref >60)
GFR calc non Af Amer: >60 mL/min (ref >60)
Glucose, Bld: 155 mg/dL — ABNORMAL HIGH (ref 70–99)
Potassium: 4.3 mmol/L (ref 3.5–5.1)
Sodium: 135 mmol/L (ref 135–145)

## 2019-07-27 LAB — CBC
HCT: 38.7 % — ABNORMAL LOW (ref 39.0–52.0)
Hemoglobin: 12 g/dL — ABNORMAL LOW (ref 13.0–17.0)
MCH: 25.6 pg — ABNORMAL LOW (ref 26.0–34.0)
MCHC: 31 g/dL (ref 30.0–36.0)
MCV: 82.7 fL (ref 80.0–100.0)
Platelets: 133 10*3/uL — ABNORMAL LOW (ref 150–400)
RBC: 4.68 MIL/uL (ref 4.22–5.81)
RDW: 17.3 % — ABNORMAL HIGH (ref 11.5–15.5)
WBC: 5.1 10*3/uL (ref 4.0–10.5)
nRBC: 0 % (ref 0.0–0.2)

## 2019-07-27 LAB — GLUCOSE, CAPILLARY
Glucose-Capillary: 118 mg/dL — ABNORMAL HIGH (ref 70–99)
Glucose-Capillary: 215 mg/dL — ABNORMAL HIGH (ref 70–99)
Glucose-Capillary: 102 mg/dL — ABNORMAL HIGH (ref 70–99)
Glucose-Capillary: 183 mg/dL — ABNORMAL HIGH (ref 70–99)

## 2019-07-27 LAB — AEROBIC/ANAEROBIC CULTURE W GRAM STAIN (SURGICAL/DEEP WOUND): Culture: NO GROWTH

## 2019-07-27 LAB — MAGNESIUM: Magnesium: 2.1 mg/dL (ref 1.7–2.4)

## 2019-07-27 NOTE — Progress Notes (Signed)
PROGRESS NOTE    Lawrence Rogers  ZOX:[id number] DOB: 05/06/1961 DOA: 07/19/2019 PCP: [name], MD    Brief Narrative:   58 year old male with a history of NASH Cirrhosis, diabetes mellitus type 2, and GERD presenting with 1 month history of shortness of breath and dyspnea on exertion.  He stated that his breathing and dyspnea on exertion got significantly worse in the 24 hours prior to admission.  In addition, he began developing right-sided pleuritic chest pain.  He had denied any fevers, chills, nausea, vomiting, diarrhea, abdominal pain, headache, coughing, hemoptysis.  In the emergency department, the patient had a low-grade temperature of 99.6 F but remained hemodynamically stable.  He required supplemental oxygen at 3 L for his hypoxia.  WBC was 7.6 at the time of admission.  CT angiogram of the chest showed a large right-sided pleural effusion with complete collapse of the right lower lobe.  There is also some scattered left-sided lung nodules.  There was no pulmonary embolus.  Thoracocentesis was performed in the emergency department draining 1 L of fluid. Pleural fluid studies show an exudate with WBC 22,531.  Preliminary Gram stain shows gram-positive cocci.  The patient was started on vancomycin and ceftriaxone.    Transferred to [facility] on 07/20/2019 for thoracic surgery consultation for empyema and possible decortication.   Assessment & Plan:   Principal Problem:   Empyema lung ([place]) Active Problems:   Diabetes ([place])   Cirrhosis of liver with ascites (HCC)   Acute respiratory distress   Pleural effusion   Acute respiratory failure with hypoxia (HCC)   Empyema (HCC)  Acute hypoxic respiratory failure Right large pleural effusion with empyema Streptococcus mitis/oralis Patient presenting with progressive shortness of breath and right-sided pleuritic chest pain.  CT angiogram chest notable for a large right-sided pleural effusion with complete collapse of the right  lower lobe; negative for pulmonary embolism.  Patient underwent thoracentesis by EDP at any [facility] with removal of 1 L fluid with pleural fluid studies notable for WBC count of 22,531 and Gram stain showing gram-positive cocci.  Effusion consistent with exudate/empyema. --Cardiothoracic surgery following, appreciate assistance --Pleural Fluid culture 07/19/2019 positive for Streptococcus mitis/oralis --s/p R VATS with decortication and intercostal nerve block on 07/22/2019 --Intraoperative pleural fluid/tissue culture on 07/22/2019: no growth x 2 days --chest tube placed to water seal per CTS --CT w/ 1650-->800-->550-->347m out past 24h --Continue antibiotics with ceftriaxone, plan 14d course; can change to Ceftin on d/c --continue chest tube until output<3042mand CTS considering bedside chemical pleurodesis  --repeat CXR in am  Hx NASH cirrhosis with ascites Patient with mild abdominal distention and lower extremity edema. MELD score on admission 11 correlating with a 6% estimated 3-54-monthrtality.  On furosemide 80 mg p.o. daily and spironolactone 200 mg p.o. daily at home. --net negative 2.7L past 24h and net negative 9.0L this hospitalization --continue furosemide 40 mg IV TID --continue spironolactone 200 mg p.o. daily --Sodium restricted diet, fluid restrict 2000 mL's/day --IR consulted for therapeutic paracentesis; but not able 2/2 insufficient fluid present in abdomen --Strict I's and O's and daily weights --monitor renal funtion daily with aggressive diuresis  Type 2 diabetes mellitus Hemoglobin A1c 7.0 on 07/19/2019.  Home regimen includes glipizide 5 mg p.o. daily, metformin 1000 mg p.o. twice daily. 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  Platelet count 105.  Continues on  Lovenox for DVT prophylaxis. --Continue to monitor platelet count closely   DVT prophylaxis: Lovenox; continue to monitor platelet count closely Code Status: Full code Family Communication: None Disposition Plan: Continue inpatient hospitalization, further dependent on clinical course, anticipate discharge home when medically ready   Consultants:   Cardiothoracic surgery - Dr. Kipp Brood  Procedures:   Thoracentesis by [place] 8/2 w/ 1L removed  R VATS with decortication and intercostal nerve block on 07/22/2019 by Dr. Kipp Brood  Attempt Paracentesis 07/24/2019; not enough abdominal fluid per IR  Antimicrobials:   Vancomycin 8/2 - 8/6  Ceftriaxone 8/2>>   Subjective: Patient seen and examined at bedside, resting comfortably.  Continues with good urinary output.  No complaints or concerns at this time.  Chest tube continues to drain, but less today; remains on water seal. CTS planning possible bedside chemical pleurodesis.  Denies headache, no fever/chills/night sweats, no nausea vomiting/diarrhea, no chest pain, no palpitations, no abdominal pain, no weakness, no issues with bowel/bladder function, no paresthesias.  No acute events overnight per nursing staff.  Objective: Vitals:   07/26/19 2014 07/27/19 0137 07/27/19 0628 07/27/19 0832  BP: 116/60 118/61 108/64 108/72  Pulse: 77 74 70 68  Resp: 12 16 18    Temp: 98.9 F (37.2 C) 98.2 F (36.8 C) 97.7 F (36.5 C) 98.2 F (36.8 C)  TempSrc: Oral Oral Oral Oral  SpO2: 95% 94% 96%   Weight:      Height:        Intake/Output Summary (Last 24 hours) at 07/27/2019 1015 Last data filed at 07/27/2019 0916 Gross per 24 hour  Intake 390 ml  Output 3045 ml  Net -2655 ml   Filed Weights   07/24/19 0404 07/25/19 0408 07/26/19 0607  Weight: 102.4 kg 100 kg 94 kg    Examination:  General exam: Appears calm and comfortable  Respiratory system: Breath sounds slightly decreased right base, normal respiratory effort, no  crackles/wheezing, oxygenating well on room air, chest tube site noted, currently on waterseal Cardiovascular system: S1 & S2 heard, RRR. No JVD, murmurs, rubs, gallops or clicks.  1+ pitting edema bilateral lower extremities to mid shin Gastrointestinal system: Abdomen is nondistended, soft and nontender. No organomegaly or masses felt. Normal bowel sounds heard. Central nervous system: Alert and oriented. No focal neurological deficits. Extremities: Symmetric 5 x 5 power.  1+ pitting edema bilateral lower extremities to mid shin Skin: No rashes, lesions or ulcers Psychiatry: Judgement and insight appear normal. Mood & affect appropriate.     Data Reviewed: I have personally reviewed following labs and imaging studies  CBC: Recent Labs  Lab 07/22/19 0358 07/23/19 0535 07/24/19 0722 07/25/19 0229 07/27/19 0757  WBC 5.2 5.8 5.9 4.6 5.1  HGB 10.7* 9.7* 11.0* 10.2* 12.0*  HCT 34.4* 31.5* 35.5* 33.4* 38.7*  MCV 82.1 83.3 82.9 82.7 82.7  PLT 88* 93* 114* 105* 824*   Basic Metabolic Panel: Recent Labs  Lab 07/22/19 0358 07/23/19 0535 07/24/19 0722 07/25/19 0229 07/26/19 0253 07/27/19 0757  NA 135 134* 134* 136 138 135  K 3.7 4.4 4.0 3.7 3.6 4.3  CL 101 103 103 104 101 101  CO2 25 22 22 23 26 24   GLUCOSE 110* 219* 118* 108* 126* 155*  BUN 9 15 16 13 14 12   CREATININE 0.62 0.75 0.80 0.69 0.84 0.82  CALCIUM 8.5* 8.5* 8.5* 8.4* 8.8* 8.7*  MG 2.0 2.0  --   --  1.8 2.1   GFR: Estimated Creatinine Clearance: 112.3  mL/min (by C-G formula based on SCr of 0.82 mg/dL). Liver Function Tests: Recent Labs  Lab 07/21/19 1149 07/22/19 0358 07/24/19 0722  AST 38 33 44*  ALT 36 31 37  ALKPHOS 91 84 108  BILITOT 1.6* 1.5* 1.5*  PROT 5.5* 5.0* 4.9*  ALBUMIN 2.8* 2.6* 2.6*   No results for input(s): LIPASE, AMYLASE in the last 168 hours. No results for input(s): AMMONIA in the last 168 hours. Coagulation Profile: Recent Labs  Lab 07/21/19 1600 07/22/19 0358  INR 1.2 1.3*    Cardiac Enzymes: No results for input(s): CKTOTAL, CKMB, CKMBINDEX, TROPONINI in the last 168 hours. BNP (last 3 results) No results for input(s): PROBNP in the last 8760 hours. HbA1C: No results for input(s): HGBA1C in the last 72 hours. CBG: Recent Labs  Lab 07/26/19 0750 07/26/19 1205 07/26/19 1614 07/26/19 2133 07/27/19 0629  GLUCAP 116* 152* 187* 135* 118*   Lipid Profile: No results for input(s): CHOL, HDL, LDLCALC, TRIG, CHOLHDL, LDLDIRECT in the last 72 hours. Thyroid Function Tests: No results for input(s): TSH, T4TOTAL, FREET4, T3FREE, THYROIDAB in the last 72 hours. Anemia Panel: No results for input(s): VITAMINB12, FOLATE, FERRITIN, TIBC, IRON, RETICCTPCT in the last 72 hours. Sepsis Labs: No results for input(s): PROCALCITON, LATICACIDVEN in the last 168 hours.  Recent Results (from the past 240 hour(s))  SARS Coronavirus 2 [facility] order, Performed in [facility] hospital lab) Nasopharyngeal Nasopharyngeal Swab     Status: None   Collection Time: 07/19/19 12:32 PM   Specimen: Nasopharyngeal Swab  Result Value Ref Range Status   SARS Coronavirus 2 NEGATIVE NEGATIVE Final    Comment: (NOTE) If result is NEGATIVE SARS-CoV-2 target nucleic acids are NOT DETECTED. The SARS-CoV-2 RNA is generally detectable in upper and lower  respiratory specimens during the acute phase of infection. The lowest  concentration of SARS-CoV-2 viral copies this assay can detect is 250  copies / mL. A negative result does not preclude SARS-CoV-2 infection  and should not be used as the sole basis for treatment or other  patient management decisions.  A negative result may occur with  improper specimen collection / handling, submission of specimen other  than nasopharyngeal swab, presence of viral mutation(s) within the  areas targeted by this assay, and inadequate number of viral copies  (<250 copies / mL). A negative result must be combined with clinical  observations, patient  history, and epidemiological information. If result is POSITIVE SARS-CoV-2 target nucleic acids are DETECTED. The SARS-CoV-2 RNA is generally detectable in upper and lower  respiratory specimens dur ing the acute phase of infection.  Positive  results are indicative of active infection with SARS-CoV-2.  Clinical  correlation with patient history and other diagnostic information is  necessary to determine patient infection status.  Positive results do  not rule out bacterial infection or co-infection with other viruses. If result is PRESUMPTIVE POSTIVE SARS-CoV-2 nucleic acids MAY BE PRESENT.   A presumptive positive result was obtained on the submitted specimen  and confirmed on repeat testing.  While 2019 novel coronavirus  (SARS-CoV-2) nucleic acids may be present in the submitted sample  additional confirmatory testing may be necessary for epidemiological  and / or clinical management purposes  to differentiate between  SARS-CoV-2 and other Sarbecovirus currently known to infect humans.  If clinically indicated additional testing with an alternate test  methodology [phone]) is advised. The SARS-CoV-2 RNA is generally  detectable in upper and lower respiratory sp ecimens during the acute  phase of  infection. The expected result is Negative. Fact Sheet for Patients:  StrictlyIdeas.no Fact Sheet for Healthcare Providers: BankingDealers.co.za This test is not yet approved or cleared by the [place] FDA and has been authorized for detection and/or diagnosis of SARS-CoV-2 by FDA under an Emergency Use Authorization (EUA).  This EUA will remain in effect (meaning this test can be used) for the duration of the COVID-19 declaration under Section 564(b)(1) of the Act, 21 U.S.C. section 360bbb-3(b)(1), unless the authorization is terminated or revoked sooner. Performed at [facility], [address], [place], [place] [address]   Culture,  body fluid-bottle     Status: Abnormal   Collection Time: 07/19/19  4:18 PM   Specimen: Pleura  Result Value Ref Range Status   Specimen Description   Final    PLEURAL BOTTLES DRAWN AEROBIC AND ANAEROBIC Performed at [facility], [address], [place], [place] [address]    Special Requests   Final    10CC Performed at [facility], [address] [place], [place] [address]    Gram Stain   Final    GRAM POSITIVE COCCI Gram Stain Report Called to,Read Back By and Verified With: A. ROGERS @0907  08/03/2020KAY AEROBIC AND ANAEROBIC BOTTLES Performed at [facility], [address] [place], [place] [address]    Culture STREPTOCOCCUS MITIS/ORALIS (A)  Final   Report Status 07/23/2019 FINAL  Final   Organism ID, Bacteria STREPTOCOCCUS MITIS/ORALIS  Final      Susceptibility   Streptococcus mitis/oralis - MIC*    TETRACYCLINE 4 SENSITIVE Sensitive     VANCOMYCIN <=0.12 SENSITIVE Sensitive     CLINDAMYCIN <=0.25 SENSITIVE Sensitive     PENICILLIN Value in next row Sensitive      SENSITIVE<=0.06    CEFTRIAXONE Value in next row Sensitive      SENSITIVE<=0.12    * STREPTOCOCCUS MITIS/ORALIS  Gram stain     Status: None   Collection Time: 07/19/19  4:18 PM   Specimen: Pleura  Result Value Ref Range Status   Specimen Description PLEURAL  Final   Special Requests NONE  Final   Gram Stain   Final    CYTOSPIN SMEAR NO ORGANISMS SEEN WBC PRESENT,BOTH PMN AND MONONUCLEAR Performed at [facility], [address], [place], [place] [address]    Report Status 07/19/2019 FINAL  Final  Acid Fast Smear (AFB)     Status: None   Collection Time: 07/22/19  9:19 AM   Specimen: Pleural, Right; Body Fluid  Result Value Ref Range Status   AFB Specimen Processing Concentration  Final   Acid Fast Smear Negative  Final    Comment: (NOTE) Performed At: [facility] [place], [place] [id number] Rush Farmer MD TD:[id number]    Source (AFB) PLEURAL  Final    Comment: RIGHT  Performed at [facility], [place] [address], [place],  [address]   Body fluid culture     Status: None   Collection Time: 07/22/19  9:19 AM   Specimen: Pleura  Result Value Ref Range Status   Specimen Description PLEURAL RIGHT  Final   Special Requests PATIENT ON FOLLOWING VANC  Final   Gram Stain   Final    MODERATE WBC PRESENT,BOTH PMN AND MONONUCLEAR NO ORGANISMS SEEN    Culture   Final    NO GROWTH 3 DAYS Performed at [facility], [place] [address] [place],  [address]    Report Status 07/25/2019 FINAL  Final  Aerobic/Anaerobic Culture (  surgical/deep wound)     Status: None (Preliminary result)   Collection Time: 07/22/19  9:27 AM   Specimen: Pleural, Right; Tissue  Result Value Ref Range Status   Specimen Description TISSUE RIGHT PLEURAL  Final   Special Requests PATIENT ON FOLLOWING VANC  Final   Gram Stain   Final    MODERATE WBC PRESENT,BOTH PMN AND MONONUCLEAR NO ORGANISMS SEEN    Culture   Final    NO GROWTH 4 DAYS NO ANAEROBES ISOLATED; CULTURE IN PROGRESS FOR 5 DAYS Performed at [facility], [place] [address], [place], [place] [address]    Report Status PENDING  Incomplete         Radiology Studies: Dg Chest Port 1 View  Result Date: 07/26/2019 CLINICAL DATA:  Postsurgical follow-up EXAM: PORTABLE CHEST 1 VIEW COMPARISON:  Chest radiograph from one day prior. FINDINGS: Stable right apical chest tube position. Stable cardiomediastinal silhouette with top-normal heart size. Small right apical pneumothorax is stable, proximally a 5%. No left pneumothorax. No pleural effusion. Stable vascular crowding without overt pulmonary edema. Mild bibasilar atelectasis, stable. IMPRESSION: 1. Stable small right apical pneumothorax with right chest tube in place. 2. Stable mild bibasilar atelectasis. Electronically Signed   By: Ilona Sorrel M.D.   On: 07/26/2019 06:52        Scheduled Meds: . acetaminophen  1,000 mg Oral Q6H   Or  .  acetaminophen (TYLENOL) oral liquid 160 mg/5 mL  1,000 mg Oral Q6H  . bisacodyl  10 mg Oral Daily  . enoxaparin (LOVENOX) injection  40 mg Subcutaneous Q24H  . furosemide  40 mg Intravenous TID  . insulin aspart  0-15 Units Subcutaneous TID WC  . insulin aspart  0-5 Units Subcutaneous QHS  . insulin glargine  10 Units Subcutaneous QHS  . pantoprazole  40 mg Oral Daily  . senna-docusate  1 tablet Oral QHS  . spironolactone  200 mg Oral Daily   Continuous Infusions: . cefTRIAXone (ROCEPHIN)  IV 2 g (07/27/19 0854)     LOS: 7 days    Time spent: 36 minutes spent on chart review, personally reviewing all imaging studies and labs, discussion with nursing staff, consultants, updating family and interview/physical exam; more than 50% of that time was spent in counseling and/or coordination of care.    Eric J [place] DO [facility] Pager [phone]  If 7PM-7AM, please contact night-coverage [email or web address] Password [email or web address] 07/27/2019, 10:15 AM

## 2019-07-27 NOTE — Progress Notes (Signed)
5 Days Post-Op Procedure(s) (LRB): VIDEO ASSISTED THORACOSCOPY (VATS)/DECORTICATION (Right) Drainage Of Pleural Effusion (Right) Subjective: No new problems. Says breathing is about the same and pain is well controlled.   Objective: Vital signs in last 24 hours: Temp:  [97.7 F (36.5 C)-98.9 F (37.2 C)] 97.7 F (36.5 C) (08/10 0628) Pulse Rate:  [69-77] 70 (08/10 0628) Cardiac Rhythm: Normal sinus rhythm (08/09 2015) Resp:  [12-18] 18 (08/10 0628) BP: (108-128)/(60-104) 108/64 (08/10 0628) SpO2:  [94 %-96 %] 96 % (08/10 0628)     Intake/Output from previous day: 08/09 0701 - 08/10 0700 In: 240 [P.O.:240] Out: 2925 [Urine:2625; Chest Tube:300] Intake/Output this shift: No intake/output data recorded.  General appearance: alert, cooperative and no distress Neurologic: intact Heart: regular rate and rhythm Lungs: Breath sounds clear. Rigth CT drained 353m clear serous fluid past 24 hours.  Wound: The right mini thoracotomy incision is well approximated with Dermabond and is dry.   Lab Results: Recent Labs    07/25/19 0229  WBC 4.6  HGB 10.2*  HCT 33.4*  PLT 105*   BMET:  Recent Labs    07/25/19 0229 07/26/19 0253  NA 136 138  K 3.7 3.6  CL 104 101  CO2 23 26  GLUCOSE 108* 126*  BUN 13 14  CREATININE 0.69 0.84  CALCIUM 8.4* 8.8*    PT/INR: No results for input(s): LABPROT, INR in the last 72 hours. ABG    Component Value Date/Time   PHART 7.391 07/23/2019 0540   HCO3 23.8 07/23/2019 0540   ACIDBASEDEF 0.5 07/23/2019 0540   O2SAT 97.8 07/23/2019 0540   CBG (last 3)  Recent Labs    07/26/19 1614 07/26/19 2133 07/27/19 0629  GLUCAP 187* 135* 118*    Assessment/Plan: S/P Procedure(s) (LRB): VIDEO ASSISTED THORACOSCOPY (VATS)/DECORTICATION (Right) Drainage Of Pleural Effusion (Right)   LOS: 7 days   -POD5 VATS, drainage of recurrent right pleural effusion. Volume of drainage is trending down with 3072mpast 24 hours. CXR yesterday showed a  small 5% apical PTX. No air leak.  Will leave the chest tube to water seal and observe. . Dr. LiKipp Broods considering chemical pleurodesis if drainage persists. Repeat CXR in AM.  -[facility].  Pleural fluid cx from 8/5 show no growth at 4 days.  Remains on IV ceftriaxone.   -Diabetes mellitus-mgt per medicine service,  covered with SSI.   -NASH-mgt per medicine team  -DVT PPX-  SQ enoxaparin   MyAntony OdeaPA-C [id number]/09/2019

## 2019-07-28 ENCOUNTER — Inpatient Hospital Stay (HOSPITAL_COMMUNITY): Payer: BC Managed Care – PPO

## 2019-07-28 LAB — BASIC METABOLIC PANEL
Anion gap: 10 (ref 5–15)
BUN: 10 mg/dL (ref 6–20)
CO2: 24 mmol/L (ref 22–32)
Calcium: 8.7 mg/dL — ABNORMAL LOW (ref 8.9–10.3)
Chloride: 100 mmol/L (ref 98–111)
Creatinine, Ser: 0.75 mg/dL (ref 0.61–1.24)
GFR calc Af Amer: >60 mL/min (ref >60)
GFR calc non Af Amer: >60 mL/min (ref >60)
Glucose, Bld: 136 mg/dL — ABNORMAL HIGH (ref 70–99)
Potassium: 3.9 mmol/L (ref 3.5–5.1)
Sodium: 134 mmol/L — ABNORMAL LOW (ref 135–145)

## 2019-07-28 LAB — GLUCOSE, CAPILLARY
Glucose-Capillary: 143 mg/dL — ABNORMAL HIGH (ref 70–99)
Glucose-Capillary: 234 mg/dL — ABNORMAL HIGH (ref 70–99)
Glucose-Capillary: 125 mg/dL — ABNORMAL HIGH (ref 70–99)
Glucose-Capillary: 166 mg/dL — ABNORMAL HIGH (ref 70–99)

## 2019-07-28 MED ORDER — DOCUSATE SODIUM 100 MG PO CAPS
100.0000 mg | ORAL_CAPSULE | Freq: Two times a day (BID) | ORAL | Status: DC
  Administered 2019-07-28 – 2019-07-30 (×5): 100 mg via ORAL
  Filled 2019-07-28 (×5): qty 1

## 2019-07-28 NOTE — Progress Notes (Addendum)
6 Days Post-Op Procedure(s) (LRB): VIDEO ASSISTED THORACOSCOPY (VATS)/DECORTICATION (Right) Drainage Of Pleural Effusion (Right) Subjective: Awake and alert, resting comfortably. No new concerns.   Objective: Vital signs in last 24 hours: Temp:  [98.1 F (36.7 C)-99.3 F (37.4 C)] 98.2 F (36.8 C) (08/11 0436) Pulse Rate:  [68-81] 71 (08/11 0436) Cardiac Rhythm: Normal sinus rhythm (08/11 0700) Resp:  [21] 21 (08/10 1927) BP: (104-122)/(58-89) 109/68 (08/11 0436) SpO2:  [92 %-97 %] 94 % (08/11 0436)  Intake/Output from previous day: 08/10 0701 - 08/11 0700 In: 250 [P.O.:150; IV Piggyback:100] Out: 2390 [Urine:2280; Chest Tube:110] Intake/Output this shift: No intake/output data recorded.  General appearance: alert, cooperative and no distress Neurologic: intact Heart: regular rate and rhythm Lungs: Breath sounds clear. Rigth CT drained 123m clear serous fluid past 24 hours.  CXR this AM shows no significant  residual effusion. Right apical PTX has resolved.    Wound: The right mini thoracotomy incision is dry and intact  Lab Results: Recent Labs    07/27/19 0757  WBC 5.1  HGB 12.0*  HCT 38.7*  PLT 133*   BMET:  Recent Labs    07/27/19 0757 07/28/19 0257  NA 135 134*  K 4.3 3.9  CL 101 100  CO2 24 24  GLUCOSE 155* 136*  BUN 12 10  CREATININE 0.82 0.75  CALCIUM 8.7* 8.7*    PT/INR: No results for input(s): LABPROT, INR in the last 72 hours. ABG    Component Value Date/Time   PHART 7.391 07/23/2019 0540   HCO3 23.8 07/23/2019 0540   ACIDBASEDEF 0.5 07/23/2019 0540   O2SAT 97.8 07/23/2019 0540   CBG (last 3)  Recent Labs    07/27/19 1625 07/27/19 2126 07/28/19 0630  GLUCAP 102* 183* 143*    Assessment/Plan: S/P Procedure(s) (LRB): VIDEO ASSISTED THORACOSCOPY (VATS)/DECORTICATION (Right) Drainage Of Pleural Effusion (Right)  -POD6VATS, drainage of recurrent right pleural effusion. Volume of drainage continues to taper off with 1167mpast 24  hours. CXR shows no residual effusion, PTX on right resolved.  No air leak.  Will pull CT today, f/u CXR in AM.  -ID[facility] Pleural fluid cx from 8/5 shows no growth. Remains on IV ceftriaxone. Mgt per medicine team.   -Diabetes mellitus-mgt per medicine service,covered with SSI.   -NASH-mgt per medicine team  -DVT PPX- SQ enoxaparin   LOS: 8 days    MyAntony OdeaPA-C [id number]/10/2019   Agree with above chest tube out today Repeat CXR this afternoon  Emery Dupuy O Yahshua Thibault

## 2019-07-28 NOTE — Progress Notes (Signed)
Chest tube removed as per order.  Pt tolerated well.

## 2019-07-28 NOTE — Progress Notes (Signed)
PROGRESS NOTE    Lawrence Rogers  JYN:[id number] DOB: 1961-01-15 DOA: 07/19/2019 PCP: [name], MD    Brief Narrative:   58 year old male with a history of NASH Cirrhosis, diabetes mellitus type 2, and GERD presenting with 1 month history of shortness of breath and dyspnea on exertion.  He stated that his breathing and dyspnea on exertion got significantly worse in the 24 hours prior to admission.  In addition, he began developing right-sided pleuritic chest pain.  He had denied any fevers, chills, nausea, vomiting, diarrhea, abdominal pain, headache, coughing, hemoptysis.  In the emergency department, the patient had a low-grade temperature of 99.6 F but remained hemodynamically stable.  He required supplemental oxygen at 3 L for his hypoxia.  WBC was 7.6 at the time of admission.  CT angiogram of the chest showed a large right-sided pleural effusion with complete collapse of the right lower lobe.  There is also some scattered left-sided lung nodules.  There was no pulmonary embolus.  Thoracocentesis was performed in the emergency department draining 1 L of fluid. Pleural fluid studies show an exudate with WBC 22,531.  Preliminary Gram stain shows gram-positive cocci.  The patient was started on vancomycin and ceftriaxone.    Transferred to [facility] on 07/20/2019 for thoracic surgery consultation for empyema and possible decortication.   Assessment & Plan:   Principal Problem:   Empyema lung ([place]) Active Problems:   Diabetes ([place])   Cirrhosis of liver with ascites (HCC)   Acute respiratory distress   Pleural effusion   Acute respiratory failure with hypoxia (HCC)   Empyema (HCC)  Acute hypoxic respiratory failure Right large pleural effusion with empyema Streptococcus mitis/oralis Patient presenting with progressive shortness of breath and right-sided pleuritic chest pain.  CT angiogram chest notable for a large right-sided pleural effusion with complete collapse of the right  lower lobe; negative for pulmonary embolism.  Patient underwent thoracentesis by EDP at any [facility] with removal of 1 L fluid with pleural fluid studies notable for WBC count of 22,531 and Gram stain showing gram-positive cocci.  Effusion consistent with exudate/empyema. --Cardiothoracic surgery following, appreciate assistance --Pleural Fluid culture 07/19/2019 positive for Streptococcus mitis/oralis --s/p R VATS with decortication and intercostal nerve block on 07/22/2019 --Intraoperative pleural fluid/tissue culture on 07/22/2019: no growth x 2 days --chest tube placed to water seal per CTS --CT w/ 1650-->800-->550-->300-->17m out past 24h --Chest tube removed 8/11 --Continue antibiotics with ceftriaxone, plan 14d course; can change to Ceftin on d/c --repeat CXR in am  Hx NASH cirrhosis with ascites Patient with mild abdominal distention and lower extremity edema. MELD score on admission 11 correlating with a 6% estimated 337-monthortality.  On furosemide 80 mg p.o. daily and spironolactone 200 mg p.o. daily at home. --net negative 2.1L past 24h and net negative 11.2L this hospitalization --continue furosemide 40 mg IV TID --continue spironolactone 200 mg p.o. daily --Sodium restricted diet, fluid restrict 2000 mL's/day --IR consulted for therapeutic paracentesis; but not able 2/2 insufficient fluid present in abdomen --Strict I's and O's and daily weights --monitor renal funtion daily with aggressive diuresis --may need increased dose PO diuretics on discharge; goal ratio spironolactone:furosemide 100:40 in setting of cirrhosis.  Type 2 diabetes mellitus Hemoglobin A1c 7.0 on 07/19/2019.  Home regimen includes glipizide 5 mg p.o. daily, metformin 1000 mg p.o. twice daily. --Hold oral hypoglycemics while inpatient --Lantus 10 units subcutaneously daily --Insulin sliding scale for further coverage --Monitor CBGs qAC/HS  GERD: Continue PPI  Thrombocytopenia Etiology likely secondary  to underlying NAASH cirrhosis.  Platelet count 105.  Continues on Lovenox for DVT prophylaxis. --Continue to monitor platelet count closely   DVT prophylaxis: Lovenox; continue to monitor platelet count closely Code Status: Full code Family Communication: None Disposition Plan: Continue inpatient hospitalization, chest tube removed today with pending repeat cxr in am. Hopefull discharge home in 1-2 days   Consultants:   Cardiothoracic surgery - Dr. Kipp Brood  Procedures:   Thoracentesis by [facility] EDP 8/2 w/ 1L removed  R VATS with decortication and intercostal nerve block on 07/22/2019 by Dr. Kipp Brood  Attempt Paracentesis 07/24/2019; not enough abdominal fluid per IR  Antimicrobials:   Vancomycin 8/2 - 8/6  Ceftriaxone 8/2>>   Subjective: Patient seen and examined at bedside, resting comfortably.  Continues with good urinary output.  No complaints or concerns at this time.  Chest tube continues to drain, but less today; remains on water seal. CTS planning removal today. Denies headache, no fever/chills/night sweats, no nausea vomiting/diarrhea, no chest pain, no palpitations, no abdominal pain, no weakness, no issues with bowel/bladder function, no paresthesias.  No acute events overnight per nursing staff.  Objective: Vitals:   07/28/19 0436 07/28/19 0804 07/28/19 0806 07/28/19 0940  BP: 109/68  108/60   Pulse: 71  74   Resp:      Temp: 98.2 F (36.8 C) 97.8 F (36.6 C)    TempSrc: Oral Oral    SpO2: 94%  100%   Weight:    91.6 kg  Height:        Intake/Output Summary (Last 24 hours) at 07/28/2019 1305 Last data filed at 07/28/2019 0900 Gross per 24 hour  Intake -  Output 2115 ml  Net -2115 ml   Filed Weights   07/25/19 0408 07/26/19 0607 07/28/19 0940  Weight: 100 kg 94 kg 91.6 kg    Examination:  General exam: Appears calm and comfortable  Respiratory system: Breath sounds slightly decreased right base, normal respiratory effort, no crackles/wheezing,  oxygenating well on room air, chest tube site noted, currently on waterseal Cardiovascular system: S1 & S2 heard, RRR. No JVD, murmurs, rubs, gallops or clicks.  trace pitting edema bilateral lower extremities to mid shin Gastrointestinal system: Abdomen is nondistended, soft and nontender. No organomegaly or masses felt. Normal bowel sounds heard. Central nervous system: Alert and oriented. No focal neurological deficits. Extremities: Symmetric 5 x 5 power.  trace pitting edema bilateral lower extremities to mid shin Skin: No rashes, lesions or ulcers Psychiatry: Judgement and insight appear normal. Mood & affect appropriate.     Data Reviewed: I have personally reviewed following labs and imaging studies  CBC: Recent Labs  Lab 07/22/19 0358 07/23/19 0535 07/24/19 0722 07/25/19 0229 07/27/19 0757  WBC 5.2 5.8 5.9 4.6 5.1  HGB 10.7* 9.7* 11.0* 10.2* 12.0*  HCT 34.4* 31.5* 35.5* 33.4* 38.7*  MCV 82.1 83.3 82.9 82.7 82.7  PLT 88* 93* 114* 105* 726*   Basic Metabolic Panel: Recent Labs  Lab 07/22/19 0358 07/23/19 0535 07/24/19 0722 07/25/19 0229 07/26/19 0253 07/27/19 0757 07/28/19 0257  NA 135 134* 134* 136 138 135 134*  K 3.7 4.4 4.0 3.7 3.6 4.3 3.9  CL 101 103 103 104 101 101 100  CO2 25 22 22 23 26 24 24   GLUCOSE 110* 219* 118* 108* 126* 155* 136*  BUN 9 15 16 13 14 12 10   CREATININE 0.62 0.75 0.80 0.69 0.84 0.82 0.75  CALCIUM 8.5* 8.5* 8.5* 8.4* 8.8* 8.7* 8.7*  MG 2.0 2.0  --   --  1.8 2.1  --    GFR: Estimated Creatinine Clearance: 115.1 mL/min (by C-G formula based on SCr of 0.75 mg/dL). Liver Function Tests: Recent Labs  Lab 07/22/19 0358 07/24/19 0722  AST 33 44*  ALT 31 37  ALKPHOS 84 108  BILITOT 1.5* 1.5*  PROT 5.0* 4.9*  ALBUMIN 2.6* 2.6*   No results for input(s): LIPASE, AMYLASE in the last 168 hours. No results for input(s): AMMONIA in the last 168 hours. Coagulation Profile: Recent Labs  Lab 07/21/19 1600 07/22/19 0358  INR 1.2 1.3*    Cardiac Enzymes: No results for input(s): CKTOTAL, CKMB, CKMBINDEX, TROPONINI in the last 168 hours. BNP (last 3 results) No results for input(s): PROBNP in the last 8760 hours. HbA1C: No results for input(s): HGBA1C in the last 72 hours. CBG: Recent Labs  Lab 07/27/19 1126 07/27/19 1625 07/27/19 2126 07/28/19 0630 07/28/19 1158  GLUCAP 215* 102* 183* 143* 234*   Lipid Profile: No results for input(s): CHOL, HDL, LDLCALC, TRIG, CHOLHDL, LDLDIRECT in the last 72 hours. Thyroid Function Tests: No results for input(s): TSH, T4TOTAL, FREET4, T3FREE, THYROIDAB in the last 72 hours. Anemia Panel: No results for input(s): VITAMINB12, FOLATE, FERRITIN, TIBC, IRON, RETICCTPCT in the last 72 hours. Sepsis Labs: No results for input(s): PROCALCITON, LATICACIDVEN in the last 168 hours.  Recent Results (from the past 240 hour(s))  SARS Coronavirus 2 [facility] order, Performed in [facility] hospital lab) Nasopharyngeal Nasopharyngeal Swab     Status: None   Collection Time: 07/19/19 12:32 PM   Specimen: Nasopharyngeal Swab  Result Value Ref Range Status   SARS Coronavirus 2 NEGATIVE NEGATIVE Final    Comment: (NOTE) If result is NEGATIVE SARS-CoV-2 target nucleic acids are NOT DETECTED. The SARS-CoV-2 RNA is generally detectable in upper and lower  respiratory specimens during the acute phase of infection. The lowest  concentration of SARS-CoV-2 viral copies this assay can detect is 250  copies / mL. A negative result does not preclude SARS-CoV-2 infection  and should not be used as the sole basis for treatment or other  patient management decisions.  A negative result may occur with  improper specimen collection / handling, submission of specimen other  than nasopharyngeal swab, presence of viral mutation(s) within the  areas targeted by this assay, and inadequate number of viral copies  (<250 copies / mL). A negative result must be combined with clinical  observations, patient  history, and epidemiological information. If result is POSITIVE SARS-CoV-2 target nucleic acids are DETECTED. The SARS-CoV-2 RNA is generally detectable in upper and lower  respiratory specimens dur ing the acute phase of infection.  Positive  results are indicative of active infection with SARS-CoV-2.  Clinical  correlation with patient history and other diagnostic information is  necessary to determine patient infection status.  Positive results do  not rule out bacterial infection or co-infection with other viruses. If result is PRESUMPTIVE POSTIVE SARS-CoV-2 nucleic acids MAY BE PRESENT.   A presumptive positive result was obtained on the submitted specimen  and confirmed on repeat testing.  While 2019 novel coronavirus  (SARS-CoV-2) nucleic acids may be present in the submitted sample  additional confirmatory testing may be necessary for epidemiological  and / or clinical management purposes  to differentiate between  SARS-CoV-2 and other Sarbecovirus currently known to infect humans.  If clinically indicated additional testing with an alternate test  methodology [phone]) is advised. The SARS-CoV-2 RNA is generally  detectable in upper and lower respiratory sp ecimens during the  acute  phase of infection. The expected result is Negative. Fact Sheet for Patients:  StrictlyIdeas.no Fact Sheet for Healthcare Providers: BankingDealers.co.za This test is not yet approved or cleared by the [place] FDA and has been authorized for detection and/or diagnosis of SARS-CoV-2 by FDA under an Emergency Use Authorization (EUA).  This EUA will remain in effect (meaning this test can be used) for the duration of the COVID-19 declaration under Section 564(b)(1) of the Act, 21 U.S.C. section 360bbb-3(b)(1), unless the authorization is terminated or revoked sooner. Performed at [facility], [address] [place], [place] [address]   Culture,  body fluid-bottle     Status: Abnormal   Collection Time: 07/19/19  4:18 PM   Specimen: Pleura  Result Value Ref Range Status   Specimen Description   Final    PLEURAL BOTTLES DRAWN AEROBIC AND ANAEROBIC Performed at [facility], [address], [place], [place] [address]    Special Requests   Final    10CC Performed at [facility], [address], [place], [place] [address]    Gram Stain   Final    GRAM POSITIVE COCCI Gram Stain Report Called to,Read Back By and Verified With: A. ROGERS @0907  08/03/2020KAY AEROBIC AND ANAEROBIC BOTTLES Performed at [facility], [address] [place], [place] [address]    Culture STREPTOCOCCUS MITIS/ORALIS (A)  Final   Report Status 07/23/2019 FINAL  Final   Organism ID, Bacteria STREPTOCOCCUS MITIS/ORALIS  Final      Susceptibility   Streptococcus mitis/oralis - MIC*    TETRACYCLINE 4 SENSITIVE Sensitive     VANCOMYCIN <=0.12 SENSITIVE Sensitive     CLINDAMYCIN <=0.25 SENSITIVE Sensitive     PENICILLIN Value in next row Sensitive      SENSITIVE<=0.06    CEFTRIAXONE Value in next row Sensitive      SENSITIVE<=0.12    * STREPTOCOCCUS MITIS/ORALIS  Gram stain     Status: None   Collection Time: 07/19/19  4:18 PM   Specimen: Pleura  Result Value Ref Range Status   Specimen Description PLEURAL  Final   Special Requests NONE  Final   Gram Stain   Final    CYTOSPIN SMEAR NO ORGANISMS SEEN WBC PRESENT,BOTH PMN AND MONONUCLEAR Performed at [facility], [address] [place], [place] [address]    Report Status 07/19/2019 FINAL  Final  Acid Fast Smear (AFB)     Status: None   Collection Time: 07/22/19  9:19 AM   Specimen: Pleural, Right; Body Fluid  Result Value Ref Range Status   AFB Specimen Processing Concentration  Final   Acid Fast Smear Negative  Final    Comment: (NOTE) Performed At: [facility] [place], [place] [id number] Rush Farmer MD TZ:[id number]    Source (AFB) PLEURAL  Final    Comment: RIGHT  Performed at [facility], [place] [address], [place], [place] [address]   Body fluid culture     Status: None   Collection Time: 07/22/19  9:19 AM   Specimen: Pleura  Result Value Ref Range Status   Specimen Description PLEURAL RIGHT  Final   Special Requests PATIENT ON FOLLOWING VANC  Final   Gram Stain   Final    MODERATE WBC PRESENT,BOTH PMN AND MONONUCLEAR NO ORGANISMS SEEN    Culture   Final    NO GROWTH 3 DAYS Performed at [facility], [place] [address] [place], [place] [address]    Report Status 07/25/2019 FINAL  Final  Aerobic/Anaerobic Culture (surgical/deep wound)     Status: None   Collection Time: 07/22/19  9:27 AM   Specimen: Pleural, Right; Tissue  Result Value Ref Range Status   Specimen Description TISSUE RIGHT PLEURAL  Final   Special Requests PATIENT ON FOLLOWING VANC  Final   Gram Stain   Final    MODERATE WBC PRESENT,BOTH PMN AND MONONUCLEAR NO ORGANISMS SEEN    Culture   Final    No growth aerobically or anaerobically. Performed at [facility], [place] [address] [place], [place] [address]    Report Status 07/27/2019 FINAL  Final         Radiology Studies: No results found.      Scheduled Meds: . bisacodyl  10 mg Oral Daily  . docusate sodium  100 mg Oral BID  . enoxaparin (LOVENOX) injection  40 mg Subcutaneous Q24H  . furosemide  40 mg Intravenous TID  . insulin aspart  0-15 Units Subcutaneous TID WC  . insulin aspart  0-5 Units Subcutaneous QHS  . insulin glargine  10 Units Subcutaneous QHS  . pantoprazole  40 mg Oral Daily  . senna-docusate  1 tablet Oral QHS  . spironolactone  200 mg Oral Daily   Continuous Infusions: . cefTRIAXone (ROCEPHIN)  IV 2 g (07/28/19 1134)     LOS: 8 days    Time spent: 32 minutes spent on chart review, personally reviewing all imaging studies and labs, discussion with nursing staff, consultants, updating family and interview/physical exam; more than 50% of that time was spent in  counseling and/or coordination of care.    Dimas Scheck J [place] DO [facility] Pager [phone]  If 7PM-7AM, please contact night-coverage [email or web address] Password [email or web address] 07/28/2019, 1:05 PM

## 2019-07-28 NOTE — Progress Notes (Signed)
Pt ambulated in hall with NT, 470' standby assist, on room air.

## 2019-07-28 NOTE — Progress Notes (Signed)
Pt ambulated in hall 470', standby assist. No O2 or assistive equipment needed.  Tolerated very well.  Back to room in recliner, call bell within reach.

## 2019-07-29 ENCOUNTER — Inpatient Hospital Stay (HOSPITAL_COMMUNITY): Payer: BC Managed Care – PPO

## 2019-07-29 LAB — BASIC METABOLIC PANEL
Anion gap: 10 (ref 5–15)
BUN: 12 mg/dL (ref 6–20)
CO2: 25 mmol/L (ref 22–32)
Calcium: 9 mg/dL (ref 8.9–10.3)
Chloride: 99 mmol/L (ref 98–111)
Creatinine, Ser: 0.79 mg/dL (ref 0.61–1.24)
GFR calc Af Amer: >60 mL/min (ref >60)
GFR calc non Af Amer: >60 mL/min (ref >60)
Glucose, Bld: 146 mg/dL — ABNORMAL HIGH (ref 70–99)
Potassium: 4 mmol/L (ref 3.5–5.1)
Sodium: 134 mmol/L — ABNORMAL LOW (ref 135–145)

## 2019-07-29 LAB — GLUCOSE, CAPILLARY
Glucose-Capillary: 136 mg/dL — ABNORMAL HIGH (ref 70–99)
Glucose-Capillary: 239 mg/dL — ABNORMAL HIGH (ref 70–99)
Glucose-Capillary: 138 mg/dL — ABNORMAL HIGH (ref 70–99)
Glucose-Capillary: 234 mg/dL — ABNORMAL HIGH (ref 70–99)

## 2019-07-29 NOTE — Progress Notes (Addendum)
7 Days Post-Op Procedure(s) (LRB): VIDEO ASSISTED THORACOSCOPY (VATS)/DECORTICATION (Right) Drainage Of Pleural Effusion (Right) Subjective: Having some occasional right chest incisional pain, breathing is "better".   Objective: Vital signs in last 24 hours: Temp:  [98.1 F (36.7 C)-98.4 F (36.9 C)] 98.1 F (36.7 C) (08/12 0432) Pulse Rate:  [70-73] 70 (08/11 2340) Cardiac Rhythm: Normal sinus rhythm (08/12 0700) Resp:  [14-20] 14 (08/12 0432) BP: (106-114)/(61-70) 106/62 (08/12 0432) SpO2:  [95 %-97 %] 96 % (08/12 0432) Weight:  [90.7 kg-91.6 kg] 90.7 kg (08/12 0613)    Intake/Output from previous day: 08/11 0701 - 08/12 0700 In: -  Out: 1575 [Urine:1575] Intake/Output this shift: No intake/output data recorded.  General appearance:alert, cooperative and no distress Neurologic:intact Heart:regular rate and rhythm Lungs:Breath sounds clear. Rigth CT insertion site is covered with a dry dressing.  CXR this AM reviewed and shows some re-accumulation of fluid on the right with an air-fluid level. No PTX.  (Radiology report not yet available) Wound:The right mini thoracotomy incision is dry and intact  Lab Results: Recent Labs    07/27/19 0757  WBC 5.1  HGB 12.0*  HCT 38.7*  PLT 133*   BMET:  Recent Labs    07/28/19 0257 07/29/19 0618  NA 134* 134*  K 3.9 4.0  CL 100 99  CO2 24 25  GLUCOSE 136* 146*  BUN 10 12  CREATININE 0.75 0.79  CALCIUM 8.7* 9.0    PT/INR: No results for input(s): LABPROT, INR in the last 72 hours. ABG    Component Value Date/Time   PHART 7.391 07/23/2019 0540   HCO3 23.8 07/23/2019 0540   ACIDBASEDEF 0.5 07/23/2019 0540   O2SAT 97.8 07/23/2019 0540   CBG (last 3)  Recent Labs    07/28/19 1653 07/28/19 2125 07/29/19 0613  GLUCAP 125* 166* 136*    Assessment/Plan: S/P Procedure(s) (LRB): VIDEO ASSISTED THORACOSCOPY (VATS)/DECORTICATION (Right) Drainage Of Pleural Effusion (Right)  -[id number], drainage of recurrent  right pleural effusion.Volume of drainage tapered down to 197m for 24 hours so the CT was removed yesterday. CXR shows some re-accumulation of the right pleural effusion. He is not symtomatic from this presently but will need follow with serial CXR's.   -[facility] Pleural fluid cx from 8/5 shows no growth. Remains on IV ceftriaxone.Mgt per medicine team.   -Diabetes mellitus-mgt per medicine service,covered with SSI.   -NASH-mgt per medicine team  -DVT PPX- SQ enoxaparin   LOS: 9 days    MAntony Odea PA-C [phone]/11/2019   Agree with above. Some fluid on today's CXR. Given that this is 2/2 to his ascites, continue aggressive diuresis Can follow-up with me in 1 weeks with serial CXR.  Idan Prime OBary Leriche

## 2019-07-29 NOTE — Progress Notes (Signed)
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    Found on CT angiogram to have a large right-sided effusion  Transferred to [facility] on 07/20/2019 for thoracic surgery consultation for empyema and possible decortication.   Assessment & Plan:   Principal Problem:   Empyema lung ([place]) Active Problems:   Diabetes ([place])   Cirrhosis of liver with ascites (HCC)   Acute respiratory distress   Pleural effusion   Acute respiratory failure with hypoxia (HCC)   Empyema (HCC)  Acute hypoxic respiratory failure Right large pleural effusion with empyema Streptococcus mitis/oralis CT showed large left-sided exudative effusion with gram-positive cocci --Pleural Fluid culture 07/19/2019 positive for Streptococcus mitis/oralis --s/p R VATS with decortication and intercostal nerve block on 07/22/2019-his pain is controlled --Intraoperative pleural fluid/tissue culture on 07/22/2019: no growth x 2 days --chest tube placed to water seal per CTS --CT w/ 1650-->800-->550-->300-->182m out past 24h --Chest tube removed 8/11 --Continue antibiotics with ceftriaxone, plan 14d course; can change to Ceftin on d/c --CT surgery feels he needs a couple more x-rays to make sure he does not reaccumulate quickly-he does not have shifting dullness on my exam today and does not feel full in addition his weight has dropped from the 100 kg range to the 90 kg range  Hx NASH cirrhosis with ascites  MELD score on admission 11 correlating with a 6% estimated 346-monthortality. On furosemide 80 mg p.o. daily and spironolactone 200 mg p.o. daily at home. --net  negative 2.1L past 24h and net negative 11.2L this hospitalization --continue furosemide 40 mg IV TID+-continue spironolactone 200 mg p.o. daily --Sodium restricted diet, fluid restrict 2000 mL's/day --IR consulted for therapeutic paracentesis; but not able 2/2 insufficient fluid present in abdomen  Type 2 diabetes mellitus Hemoglobin A1c 7.0 on 07/19/2019.  Home regimen includes glipizide 5 mg p.o. daily, metformin 1000 mg p.o. twice daily. 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  Platelet count 133 continues on Lovenox for DVT prophylaxis. --Continue to monitor platelet count closely   DVT prophylaxis: Lovenox; continue to monitor platelet count closely Code Status: Full code Family Communication: None Disposition Plan: Continue inpatient hospitalization, await repeat chest x-ray a.m.   Consultants:   Cardiothoracic surgery - Dr. LiKipp BroodProcedures:   Thoracentesis by AnForestine NaDP 8/2 w/ 1L removed  R VATS with decortication and intercostal nerve block on 07/22/2019 by Dr. LiKipp BroodAttempt Paracentesis 07/24/2019; not enough abdominal fluid per IR  Antimicrobials:   Vancomycin 8/2 - 8/6  Ceftriaxone 8/2>>   Subjective: P Doing fair no overt complaints does not feel full chest is fine he feels much better than he did on admission  Objective: Vitals:   07/29/19 0432 07/29/19 0613 07/29/19 0831 07/29/19 1233  BP: 106/62  118/72 115/65  Pulse:   76 73  Resp: 14  18   Temp: 98.1 F (36.7 C)  98.3 F (36.8 C) 98.6 F (37 C)  TempSrc: Oral  Oral Oral  SpO2: 96%  93%  96%  Weight:  90.7 kg    Height:        Intake/Output Summary (Last 24 hours) at 07/29/2019 1608 Last data filed at 07/29/2019 1547 Gross per 24 hour  Intake 480 ml  Output 900 ml  Net -420 ml    Filed Weights   07/26/19 0607 07/28/19 0940 07/29/19 0613  Weight: 94 kg 91.6 kg 90.7 kg    Examination:  Alert coherent pleasant no distress good dentition no JVD no hepatojugular reflex no carotid bruit Abdomen is soft without shifting dullness Chest is clear no added sound area of chest surgery is covered with a bandage on the right side no lower extremity edema S1-S2 no murmur rub or gallop    Data Reviewed: I have personally reviewed following labs and imaging studies  CBC: Recent Labs  Lab 07/23/19 0535 07/24/19 0722 07/25/19 0229 07/27/19 0757  WBC 5.8 5.9 4.6 5.1  HGB 9.7* 11.0* 10.2* 12.0*  HCT 31.5* 35.5* 33.4* 38.7*  MCV 83.3 82.9 82.7 82.7  PLT 93* 114* 105* 315*   Basic Metabolic Panel: Recent Labs  Lab 07/23/19 0535  07/25/19 0229 07/26/19 0253 07/27/19 0757 07/28/19 0257 07/29/19 0618  NA 134*   < > 136 138 135 134* 134*  K 4.4   < > 3.7 3.6 4.3 3.9 4.0  CL 103   < > 104 101 101 100 99  CO2 22   < > 23 26 24 24 25   GLUCOSE 219*   < > 108* 126* 155* 136* 146*  BUN 15   < > 13 14 12 10 12   CREATININE 0.75   < > 0.69 0.84 0.82 0.75 0.79  CALCIUM 8.5*   < > 8.4* 8.8* 8.7* 8.7* 9.0  MG 2.0  --   --  1.8 2.1  --   --    < > = values in this interval not displayed.   GFR: Estimated Creatinine Clearance: 115.1 mL/min (by C-G formula based on SCr of 0.79 mg/dL). Liver Function Tests: Recent Labs  Lab 07/24/19 0722  AST 44*  ALT 37  ALKPHOS 108  BILITOT 1.5*  PROT 4.9*  ALBUMIN 2.6*   No results for input(s): LIPASE, AMYLASE in the last 168 hours. No results for input(s): AMMONIA in the last 168 hours. Coagulation Profile: No results for input(s): INR, PROTIME in the last 168 hours. Cardiac Enzymes: No results for input(s): CKTOTAL, CKMB, CKMBINDEX, TROPONINI in the last 168 hours. BNP (last 3 results) No results for input(s): PROBNP in the last 8760 hours. HbA1C: No results for input(s): HGBA1C in the last 72 hours. CBG: Recent Labs   Lab 07/28/19 1158 07/28/19 1653 07/28/19 2125 07/29/19 0613 07/29/19 1113  GLUCAP 234* 125* 166* 136* 239*   Lipid Profile: No results for input(s): CHOL, HDL, LDLCALC, TRIG, CHOLHDL, LDLDIRECT in the last 72 hours. Thyroid Function Tests: No results for input(s): TSH, T4TOTAL, FREET4, T3FREE, THYROIDAB in the last 72 hours. Anemia Panel: No results for input(s): VITAMINB12, FOLATE, FERRITIN, TIBC, IRON, RETICCTPCT in the last 72 hours. Sepsis Labs: No results for input(s): PROCALCITON, LATICACIDVEN in the last 168 hours.  Recent Results (from the past 240 hour(s))  Culture, body fluid-bottle     Status: Abnormal   Collection Time: 07/19/19  4:18 PM   Specimen: Pleura  Result Value Ref Range Status   Specimen Description   Final    PLEURAL BOTTLES DRAWN AEROBIC AND ANAEROBIC Performed at [facility], [address], [place], [place] [address]  Special Requests   Final    10CC Performed at [facility], [address] [place], [place] [address]    Gram Stain   Final    GRAM POSITIVE COCCI Gram Stain Report Called to,Read Back By and Verified With: A. ROGERS @0907  08/03/2020KAY AEROBIC AND ANAEROBIC BOTTLES Performed at [facility], [address], [place], [place] [address]    Culture STREPTOCOCCUS MITIS/ORALIS (A)  Final   Report Status 07/23/2019 FINAL  Final   Organism ID, Bacteria STREPTOCOCCUS MITIS/ORALIS  Final      Susceptibility   Streptococcus mitis/oralis - MIC*    TETRACYCLINE 4 SENSITIVE Sensitive     VANCOMYCIN <=0.12 SENSITIVE Sensitive     CLINDAMYCIN <=0.25 SENSITIVE Sensitive     PENICILLIN Value in next row Sensitive      SENSITIVE<=0.06    CEFTRIAXONE Value in next row Sensitive      SENSITIVE<=0.12    * STREPTOCOCCUS MITIS/ORALIS  Gram stain     Status: None   Collection Time: 07/19/19  4:18 PM   Specimen: Pleura  Result Value Ref Range Status   Specimen Description PLEURAL  Final   Special Requests NONE  Final   Gram Stain   Final     CYTOSPIN SMEAR NO ORGANISMS SEEN WBC PRESENT,BOTH PMN AND MONONUCLEAR Performed at [facility], [address], [place], [place] [address]    Report Status 07/19/2019 FINAL  Final  Fungus Culture With Stain     Status: None (Preliminary result)   Collection Time: 07/22/19  9:19 AM   Specimen: Pleural, Right; Body Fluid  Result Value Ref Range Status   Fungus Stain Final report  Final    Comment: (NOTE) Performed At: [facility] [place], [place] [id number] Rush Farmer MD [id number]    Fungus (Mycology) Culture PENDING  Incomplete   Fungal Source PLEURAL  Final    Comment: RIGHT Performed at [facility], [place] [address], [place], [place] [address]   Acid Fast Smear (AFB)     Status: None   Collection Time: 07/22/19  9:19 AM   Specimen: Pleural, Right; Body Fluid  Result Value Ref Range Status   AFB Specimen Processing Concentration  Final   Acid Fast Smear Negative  Final    Comment: (NOTE) Performed At: [facility] [place], [place] [id number] Rush Farmer MD [id number]    Source (AFB) PLEURAL  Final    Comment: RIGHT Performed at [facility], [place] [address], [place], [place] [address]   Body fluid culture     Status: None   Collection Time: 07/22/19  9:19 AM   Specimen: Pleura  Result Value Ref Range Status   Specimen Description PLEURAL RIGHT  Final   Special Requests PATIENT ON FOLLOWING VANC  Final   Gram Stain   Final    MODERATE WBC PRESENT,BOTH PMN AND MONONUCLEAR NO ORGANISMS SEEN    Culture   Final    NO GROWTH 3 DAYS Performed at [facility], [place] [address], [place],  [address]    Report Status 07/25/2019 FINAL  Final  Fungus Culture Result     Status: None   Collection Time: 07/22/19  9:19 AM  Result Value Ref Range Status   Result 1 Comment  Final    Comment: (NOTE) KOH/Calcofluor preparation:  no fungus observed. Performed At: [facility] [place], [place] [id number] Rush Farmer MD IW:[id number]   Aerobic/Anaerobic Culture (surgical/deep wound)  Status: None   Collection Time: 07/22/19  9:27 AM   Specimen: Pleural, Right; Tissue  Result Value Ref Range Status   Specimen Description TISSUE RIGHT PLEURAL  Final   Special Requests PATIENT ON FOLLOWING VANC  Final   Gram Stain   Final    MODERATE WBC PRESENT,BOTH PMN AND MONONUCLEAR NO ORGANISMS SEEN    Culture   Final    No growth aerobically or anaerobically. Performed at [facility], [place] [address] [place], [place] [address]    Report Status 07/27/2019 FINAL  Final         Radiology Studies: Dg Chest 2 View  Result Date: 07/29/2019 CLINICAL DATA:  Chest tube removal. EXAM: CHEST - 2 VIEW COMPARISON:  Chest x-ray from yesterday. FINDINGS: Interval removal of the right-sided chest tube. New small air-fluid level in the right hemithorax. The left lung is clear. No consolidation. The heart size and mediastinal contours are within normal limits. Normal pulmonary vascularity. No acute osseous abnormality. IMPRESSION: 1. Interval removal of the right-sided chest tube. New small right hydropneumothorax. Electronically Signed   By: Titus Dubin M.D.   On: 07/29/2019 11:17   Dg Chest Port 1 View  Result Date: 07/28/2019 CLINICAL DATA:  Pneumothorax.  Chest tube. EXAM: PORTABLE CHEST 1 VIEW COMPARISON:  07/26/2011 FINDINGS: Right-sided chest tube is unchanged in position. Approximately 5% right apical pneumothorax is not significantly changed. Midline trachea. Normal heart size. No pleural fluid. Improved atelectasis at the right lung base. IMPRESSION: No change in 5% right apical pneumothorax with chest tube remaining in place. Improved right base atelectasis. Electronically Signed   By: Abigail Miyamoto M.D.   On: 07/28/2019 09:20        Scheduled Meds: . bisacodyl  10 mg Oral Daily  . docusate sodium  100 mg Oral BID  . enoxaparin (LOVENOX) injection  40 mg  Subcutaneous Q24H  . furosemide  40 mg Intravenous TID  . insulin aspart  0-15 Units Subcutaneous TID WC  . insulin aspart  0-5 Units Subcutaneous QHS  . insulin glargine  10 Units Subcutaneous QHS  . pantoprazole  40 mg Oral Daily  . senna-docusate  1 tablet Oral QHS  . spironolactone  200 mg Oral Daily   Continuous Infusions: . cefTRIAXone (ROCEPHIN)  IV 2 g (07/29/19 1107)     LOS: 9 days    Time spent: 22 minutes spent on chart review, personally reviewing all imaging studies and labs, discussion with nursing staff, consultants, updating family and interview/physical exam; more than 50% of that time was spent in counseling and/or coordination of care.  Verneita Griffes, MD [facility] 4:14 PM

## 2019-07-30 ENCOUNTER — Other Ambulatory Visit: Payer: Self-pay | Admitting: *Deleted

## 2019-07-30 ENCOUNTER — Inpatient Hospital Stay (HOSPITAL_COMMUNITY): Payer: BC Managed Care – PPO

## 2019-07-30 DIAGNOSIS — J90 Pleural effusion, not elsewhere classified: Secondary | ICD-10-CM

## 2019-07-30 LAB — COMPREHENSIVE METABOLIC PANEL
ALT: 51 U/L — ABNORMAL HIGH (ref 0–44)
AST: 54 U/L — ABNORMAL HIGH (ref 15–41)
Albumin: 3 g/dL — ABNORMAL LOW (ref 3.5–5.0)
Alkaline Phosphatase: 177 U/L — ABNORMAL HIGH (ref 38–126)
Anion gap: 10 (ref 5–15)
BUN: 14 mg/dL (ref 6–20)
CO2: 25 mmol/L (ref 22–32)
Calcium: 8.8 mg/dL — ABNORMAL LOW (ref 8.9–10.3)
Chloride: 101 mmol/L (ref 98–111)
Creatinine, Ser: 0.84 mg/dL (ref 0.61–1.24)
GFR calc Af Amer: >60 mL/min (ref >60)
GFR calc non Af Amer: >60 mL/min (ref >60)
Glucose, Bld: 147 mg/dL — ABNORMAL HIGH (ref 70–99)
Potassium: 3.9 mmol/L (ref 3.5–5.1)
Sodium: 136 mmol/L (ref 135–145)
Total Bilirubin: 1.6 mg/dL — ABNORMAL HIGH (ref 0.3–1.2)
Total Protein: 5.3 g/dL — ABNORMAL LOW (ref 6.5–8.1)

## 2019-07-30 LAB — CBC WITH DIFFERENTIAL/PLATELET
Abs Immature Granulocytes: 0.06 10*3/uL (ref 0.00–0.07)
Basophils Absolute: 0 10*3/uL (ref 0.0–0.1)
Basophils Relative: 1 %
Eosinophils Absolute: 0.2 10*3/uL (ref 0.0–0.5)
Eosinophils Relative: 2 %
HCT: 38.3 % — ABNORMAL LOW (ref 39.0–52.0)
Hemoglobin: 12.4 g/dL — ABNORMAL LOW (ref 13.0–17.0)
Immature Granulocytes: 1 %
Lymphocytes Relative: 31 %
Lymphs Abs: 2.1 10*3/uL (ref 0.7–4.0)
MCH: 25.8 pg — ABNORMAL LOW (ref 26.0–34.0)
MCHC: 32.4 g/dL (ref 30.0–36.0)
MCV: 79.8 fL — ABNORMAL LOW (ref 80.0–100.0)
Monocytes Absolute: 0.5 10*3/uL (ref 0.1–1.0)
Monocytes Relative: 8 %
Neutro Abs: 4 10*3/uL (ref 1.7–7.7)
Neutrophils Relative %: 57 %
Platelets: 123 10*3/uL — ABNORMAL LOW (ref 150–400)
RBC: 4.8 MIL/uL (ref 4.22–5.81)
RDW: 17.3 % — ABNORMAL HIGH (ref 11.5–15.5)
WBC: 7 10*3/uL (ref 4.0–10.5)
nRBC: 0 % (ref 0.0–0.2)

## 2019-07-30 LAB — GLUCOSE, CAPILLARY
Glucose-Capillary: 133 mg/dL — ABNORMAL HIGH (ref 70–99)
Glucose-Capillary: 170 mg/dL — ABNORMAL HIGH (ref 70–99)

## 2019-07-30 LAB — PROTIME-INR
INR: 1.2 (ref 0.8–1.2)
Prothrombin Time: 14.9 seconds (ref 11.4–15.2)

## 2019-07-30 MED ORDER — SPIRONOLACTONE 100 MG PO TABS: 200.0000 mg | ORAL_TABLET | Freq: Every day | ORAL | Status: DC

## 2019-07-30 MED ORDER — CEFUROXIME AXETIL 500 MG PO TABS: 500.0000 mg | ORAL_TABLET | Freq: Two times a day (BID) | ORAL | 0 refills | Status: AC

## 2019-07-30 NOTE — Progress Notes (Signed)
8 Days Post-Op Procedure(s) (LRB): VIDEO ASSISTED THORACOSCOPY (VATS)/DECORTICATION (Right) Drainage Of Pleural Effusion (Right) Subjective: Feels OK. No new problems or concerns. No change in breathing.   Objective: Vital signs in last 24 hours: Temp:  [97.9 F (36.6 C)-98.6 F (37 C)] 97.9 F (36.6 C) (08/13 0731) Pulse Rate:  [73-88] 79 (08/13 0731) Cardiac Rhythm: Normal sinus rhythm (08/13 0700) Resp:  [18-19] 18 (08/13 0731) BP: (105-115)/(57-65) 109/64 (08/13 0731) SpO2:  [93 %-96 %] 95 % (08/13 0731) Weight:  [90.2 kg] 90.2 kg (08/13 0628)    Intake/Output from previous day: 08/12 0701 - 08/13 0700 In: 960 [P.O.:960] Out: 950 [Urine:950] Intake/Output this shift: Total I/O In: 240 [P.O.:240] Out: 400 [Urine:400]  General appearance:alert, cooperative and no distress Neurologic:intact Heart:regular rate and rhythm Lungs:Breath sounds clear. Right CT insertion site is covered with a dry dressing.  CXR reviewed. Report below.  Wound:The right mini thoracotomy incision is dry and intact   Lab Results: Recent Labs    07/30/19 0414  WBC 7.0  HGB 12.4*  HCT 38.3*  PLT 123*   BMET:  Recent Labs    07/29/19 0618 07/30/19 0414  NA 134* 136  K 4.0 3.9  CL 99 101  CO2 25 25  GLUCOSE 146* 147*  BUN 12 14  CREATININE 0.79 0.84  CALCIUM 9.0 8.8*    PT/INR:  Recent Labs    07/30/19 0414  LABPROT 14.9  INR 1.2   ABG    Component Value Date/Time   PHART 7.391 07/23/2019 0540   HCO3 23.8 07/23/2019 0540   ACIDBASEDEF 0.5 07/23/2019 0540   O2SAT 97.8 07/23/2019 0540   CBG (last 3)  Recent Labs    07/29/19 1638 07/29/19 2150 07/30/19 0625  GLUCAP 138* 234* 133*   EXAM: CHEST - 2 VIEW  COMPARISON:  07/29/2019  FINDINGS: Cardiac shadow is stable. Left lung remains clear. Stable right hydropneumothorax is seen predominately in the posterior costophrenic angle. No bony abnormality is seen.  IMPRESSION: Stable right  hydropneumothorax.   Electronically Signed   By: Inez Catalina M.D.   On: 07/30/2019 08:48   Assessment/Plan: S/P Procedure(s) (LRB): VIDEO ASSISTED THORACOSCOPY (VATS)/DECORTICATION (Right) Drainage Of Pleural Effusion (Right)  -POD8VATS, drainage of recurrent right pleural effusion.CT out x 48 hours. Has small right hydropneumothorax that is stable. Pt may be discharged from thoracic surgery standpoint. OK to leave incision open to air at this point.  Follow up with CXR arranged with Dr. Kipp Brood on Friday 08/07/19 at 1:30pm. We will sign off.  Please call if we may assist further.    LOS: 10 days    [name], [place] [phone] 07/30/2019

## 2019-07-30 NOTE — Discharge Summary (Signed)
Physician Discharge Summary  Lawrence Rogers VQM:[id number] DOB: 15-Dec-1961 DOA: 07/19/2019  PCP: [name], MD  Admit date: 07/19/2019 Discharge date: 07/30/2019  Admitted From: Home Disposition: Home  Recommendations for Outpatient Follow-up:  1. Follow up with PCP in 1 week with repeat CBC/BMP 2. Outpatient follow-up with cardiothoracic surgery 3. Outpatient follow-up with gastroenterology 4. Follow up in ED if symptoms worsen or new appear   Home Health: No Equipment/Devices: None  Discharge Condition: Stable CODE STATUS: Full Diet recommendation: Heart healthy/fluid restriction of up to 1500 cc a day  Brief/Interim Summary: 58 year old male with a history ofNASH Cirrhosis, diabetes mellitus type 2, and GERD presented with 1 month history of progressively worsening shortness of breath.  He was found to be hypoxic on presentation and CT angiogram of the chest showed large right-sided pleural effusion with complete collapse of the right lower lobe but no evidence of PE.  Thoracentesis was performed in the ED draining 1 L of fluid.  He was started on broad-spectrum antibiotics and transferred to [facility] for thoracic surgery evaluation.  He will fluid culture grew Streptococcus mitis/oralis.  He underwent right-sided VATS with decortication and intercostal nerve block on 07/22/2019.  Intraoperative pleural fluid/tissue culture from 07/22/2019 have been negative so far.  He had a chest tube which was subsequently removed on 07/28/2019.  Antibiotics was narrowed to Rocephin.  Cardiothoracic surgery has cleared the patient for discharge.  He will be discharged on oral Ceftin for 3 more days.  Discharge Diagnoses:  Acute hypoxic respiratory failure Right large pleural effusion with empyema with Streptococcus mitis/oralis -Patient presented with progressive shortness of breath and right-sided pleuritic chest pain.  CT angiogram chest notable for a large right-sided pleural effusion  with complete collapse of the right lower lobe; negative for pulmonary embolism.  Patient underwent thoracentesis by EDP at any [facility] with removal of 1 L fluid with pleural fluid studies notable for WBC count of 22,531 and Gram stain showing gram-positive cocci.  Effusion consistent with exudate/empyema. --Cardiothoracic surgery following, appreciate assistance --Pleural Fluid culture 07/19/2019 positive for Streptococcus mitis/oralis --s/p R VATS with decortication and intercostal nerve block on 07/22/2019 --Intraoperative pleural fluid/tissue culture on 07/22/2019: no growth  --chest tube was placed and after drainage decreased, chest tube removed on 07/18/19 --Currently on Rocephin, day #11.  Cardiothoracic surgery has cleared the patient for discharge with outpatient follow-up with cardiothoracic surgery with repeat chest x-ray within a week.  Will discharge on 3 more days of oral Ceftin.  History of NASH cirrhosis with ascites -Currently compensated.  Currently on IV Lasix and oral spironolactone.  Diuresing well. -IR consulted for therapeutic paracentesis on 07/24/2019 but not able secondary to insufficient fluid in the abdomen. -Resume home regimen of Lasix 80 mg daily and spironolactone 200 mg daily on discharge.  Continue fluid restriction.  Outpatient follow-up with gastroenterology.  Outpatient monitoring of renal function.  Diabetes mellitus type 2 -Hemoglobin A1c 7 on 07/19/2019 -Resume home regimen on discharge.  Outpatient follow-up  GERD -Continue PPI  Thrombocytopenia -Most likely from cirrhosis.  Platelets stable.  Monitor as an outpatient.  Discharge Instructions  Discharge Instructions    Diet - low sodium heart healthy   Complete by: As directed    Diet Carb Modified   Complete by: As directed    Increase activity slowly   Complete by: As directed      Allergies as of 07/30/2019   No Known Allergies     Medication List  TAKE these medications   cefUROXime  500 MG tablet Commonly known as: CEFTIN Take 1 tablet (500 mg total) by mouth 2 (two) times daily for 3 days.   glipiZIDE 5 MG tablet Commonly known as: GLUCOTROL Take 5 mg by mouth daily before breakfast.   Lasix 40 MG tablet Generic drug: furosemide Take 80 mg by mouth daily.   metFORMIN 500 MG 24 hr tablet Commonly known as: GLUCOPHAGE-XR Take 1,000 mg by mouth 2 (two) times daily.   methocarbamol 500 MG tablet Commonly known as: ROBAXIN Take 500 mg by mouth every 8 (eight) hours as needed for muscle spasms.   pantoprazole 40 MG tablet Commonly known as: PROTONIX TAKE ONE TABLET (40MG TOTAL) BY MOUTH DAILY BEFORE BREAKFAST   spironolactone 100 MG tablet Commonly known as: ALDACTONE Take 2 tablets (200 mg total) by mouth daily. What changed: See the new instructions.   traMADol 50 MG tablet Commonly known as: ULTRAM Take 100 mg by mouth every 6 (six) hours as needed.   triamcinolone cream 0.5 % Commonly known as: KENALOG Apply 1 application topically daily as needed (irritation).      Follow-up Information    Lajuana Matte, MD. Go on 08/07/2019.   Specialty: Thoracic Surgery Why: You have an appointment with Dr. Kipp Brood on Friday 08/07/19 at 1:30.  Please arrive 30 minutes early for a chest x-ray to be performed at [facility] located on the first floor of the same building.  Contact information: [address] [place] [address] [phone]        [name], MD. Schedule an appointment as soon as possible for a visit in 1 week(s).   Specialty: Family Medicine Why: With repeat CBC/BMP Contact information: [place] [address] [phone]          No Known Allergies  Consultations:  Cardiothoracic surgery   Procedures/Studies: Dg Chest 2 View  Result Date: 07/30/2019 CLINICAL DATA:  Follow-up right pleural effusion EXAM: CHEST - 2 VIEW COMPARISON:  07/29/2019 FINDINGS: Cardiac shadow is  stable. Left lung remains clear. Stable right hydropneumothorax is seen predominately in the posterior costophrenic angle. No bony abnormality is seen. IMPRESSION: Stable right hydropneumothorax. Electronically Signed   By: Inez Catalina M.D.   On: 07/30/2019 08:48   Dg Chest 2 View  Result Date: 07/29/2019 CLINICAL DATA:  Chest tube removal. EXAM: CHEST - 2 VIEW COMPARISON:  Chest x-ray from yesterday. FINDINGS: Interval removal of the right-sided chest tube. New small air-fluid level in the right hemithorax. The left lung is clear. No consolidation. The heart size and mediastinal contours are within normal limits. Normal pulmonary vascularity. No acute osseous abnormality. IMPRESSION: 1. Interval removal of the right-sided chest tube. New small right hydropneumothorax. Electronically Signed   By: Titus Dubin M.D.   On: 07/29/2019 11:17   Dg Chest 2 View  Result Date: 07/19/2019 CLINICAL DATA:  Post right thoracentesis in ER, did inspiration and expiration images EXAM: CHEST - 2 VIEW COMPARISON:  Current CTA of the chest. FINDINGS: There is still significant opacity extending from the mid lung to the lung base obscuring the right heart border and right hemidiaphragm. This is consistent with residual fluid and atelectasis. No pneumothorax. Minimal left effusion, stable. Mild left lung base atelectasis. Left lung otherwise clear. IMPRESSION: 1. No pneumothorax or evidence of a complication following thoracentesis. 2. There is still significant opacity on the right consistent with residual pleural fluid and atelectasis. Electronically Signed   By:  Lajean Manes M.D.   On: 07/19/2019 16:57   Ct Angio Chest Pe W And/or Wo Contrast  Result Date: 07/19/2019 CLINICAL DATA:  Patient c/o shortness of breath and mid back pain that started today while out shopping. Denies any chest pain. Per patient took 171m of tramadol and used ice pack, 2 hours prior to arriving to ED, with no relief. Patient reports trying to  get appointment x1 month for paracentesis. Ascites noted.Hx of DM, cirrhosis. D-dimer .747EXAM: CT ANGIOGRAPHY CHEST WITH CONTRAST TECHNIQUE: Multidetector CT imaging of the chest was performed using the standard protocol during bolus administration of intravenous contrast. Multiplanar CT image reconstructions and MIPs were obtained to evaluate the vascular anatomy. CONTRAST:  1049mOMNIPAQUE IOHEXOL 350 MG/ML SOLN COMPARISON:  Chest radiograph dated 10/07/2018. Abdomen and pelvis CT, 10/07/2018. FINDINGS: Cardiovascular: There is satisfactory opacification of the central pulmonary arteries. The segmental and smaller branches are less well-defined due to some respiratory motion and some dispersion of the contrast bolus. Allowing for this, there is no evidence of a pulmonary embolism. Heart is normal in size. No pericardial effusion. No significant coronary artery calcifications. Great vessels are normal in caliber. No aortic dissection. Minimal aortic atherosclerosis. Mediastinum/Nodes: Numerous subcentimeter neck base and mediastinal lymph nodes. 9 are enlarged by size criteria. Visualized thyroid is unremarkable. Trachea is patent. 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 Lungs/Pleura: Large right pleural effusion occupying most of the right hemithorax. Right lower lobe is completely collapsed. There is partial atelectasis of the right middle lobe and mild dependent atelectasis in the right upper lobe. Minimal left pleural effusion. No convincing centrally obstructing mass or lesion. Small cavitary nodule, subpleural left anterior upper lobe, image 46, series 10, 7 mm. 7 mm nodule, subpleural posteromedial left lower lobe, image 111, series 10. Probable additional nodule, left lower lobe, image 115, series 10, 6 mm. No evidence of pneumonia or pulmonary edema. No pneumothorax. Minor dependent atelectasis in the posterior inferior left lower lobe. Upper  Abdomen: Changes of advanced cirrhosis with a nodular liver, extensive venous collaterals and splenomegaly as well as a small amount of ascites. There are prominent and enlarged Peri celiac and gastrohepatic ligament lymph nodes as well as retroperitoneal lymph nodes. Adenopathy and ascites have increased compared to the prior CT. Musculoskeletal: No fracture or acute finding. No osteoblastic or osteolytic lesions. Review of the MIP images confirms the above findings. IMPRESSION: 1. Large right-sided pleural effusion with complete collapse of the right lower lobe and partial atelectasis of the right middle lobe and mild dependent atelectasis in the right upper lobe. 2. Minimal left pleural effusion. 3. Three left lung nodules. Two in the left lower lobe would be in the included field of view from the prior abdomen and pelvis CT, with a are not visualized. Metastatic/neoplastic disease is possible. 4. Multiple subcentimeter neck base and mediastinal lymph nodes, more numerous than typically seen, but nonenlarged. There are prominent and enlarged Peri celiac, gastrohepatic ligament and retroperitoneal lymph nodes increased in size and number from the prior CT. 5. Changes of advanced cirrhosis with portal venous hypertension, the latter reflected by extensive varices, splenomegaly and ascites. No defined liver mass visualized, although this would be best assessed with liver MRI with and without contrast. Aortic Atherosclerosis (ICD10-I70.0). Electronically Signed   By: DaLajean Manes.D.   On: 07/19/2019 15:12   Dg Chest Port 1 View  Result Date: 07/28/2019 CLINICAL DATA:  Pneumothorax.  Chest tube. EXAM: PORTABLE CHEST 1 VIEW  COMPARISON:  07/26/2011 FINDINGS: Right-sided chest tube is unchanged in position. Approximately 5% right apical pneumothorax is not significantly changed. Midline trachea. Normal heart size. No pleural fluid. Improved atelectasis at the right lung base. IMPRESSION: No change in 5% right  apical pneumothorax with chest tube remaining in place. Improved right base atelectasis. Electronically Signed   By: Abigail Miyamoto M.D.   On: 07/28/2019 09:20   Dg Chest Port 1 View  Result Date: 07/26/2019 CLINICAL DATA:  Postsurgical follow-up EXAM: PORTABLE CHEST 1 VIEW COMPARISON:  Chest radiograph from one day prior. FINDINGS: Stable right apical chest tube position. Stable cardiomediastinal silhouette with top-normal heart size. Small right apical pneumothorax is stable, proximally a 5%. No left pneumothorax. No pleural effusion. Stable vascular crowding without overt pulmonary edema. Mild bibasilar atelectasis, stable. IMPRESSION: 1. Stable small right apical pneumothorax with right chest tube in place. 2. Stable mild bibasilar atelectasis. Electronically Signed   By: Ilona Sorrel M.D.   On: 07/26/2019 06:52   Dg Chest Port 1 View  Result Date: 07/25/2019 CLINICAL DATA:  58 year old male with history of pleural effusion. Follow-up study. EXAM: PORTABLE CHEST 1 VIEW COMPARISON:  Chest x-ray 07/24/2019. FINDINGS: Right-sided chest tube in position with tip in the apex of the right hemithorax. Trace right apical pneumothorax occupying less than 5% of the volume of the right hemithorax. Patchy multifocal interstitial and airspace disease throughout the right mid to lower lung. Left lung is clear. Small right pleural effusion. No left pleural effusion. No evidence of pulmonary edema. Heart size appears borderline enlarged. Upper mediastinal contours are within normal limits. Aortic atherosclerosis. IMPRESSION: 1. Right-sided chest tube is stable in position with very small right hydropneumothorax. 2. Patchy airspace consolidation throughout the right mid to lower lung, similar to the prior study. 3. Electronically Signed   By: Vinnie Langton M.D.   On: 07/25/2019 10:06   Dg Chest Port 1 View  Result Date: 07/24/2019 CLINICAL DATA:  Pleural effusion. EXAM: PORTABLE CHEST 1 VIEW COMPARISON:  07/23/2019.   07/22/2019.  CT 07/19/2019. FINDINGS: Right chest tube in stable position. Stable cardiomegaly. Persistent low lung volumes. Progressive infiltrate right lower lung. Small right pleural effusion cannot be excluded. Reference is made to prior CT report for discussion of pulmonary nodules present. IMPRESSION: 1.  Right chest tube in stable position.  No pneumothorax. 2. Low lung volumes. Progressive right lower lung infiltrate. Right pleural effusion again noted. Electronically Signed   By: Marcello Moores  Register   On: 07/24/2019 08:39   Dg Chest Port 1 View  Result Date: 07/23/2019 CLINICAL DATA:  58 year old male postoperative day 1 right side decortication for right pleural effusion. EXAM: PORTABLE CHEST 1 VIEW COMPARISON:  07/22/2019 and earlier. FINDINGS: Portable AP upright view at 0641 hours. The stable right chest tube. No pneumothorax. Improved right lung ventilation since 07/21/2019 but mildly increased patchy right lung base opacity since the postoperative film yesterday. Mildly lower lung volumes overall. Stable cardiac size and mediastinal contours. Visualized tracheal air column is within normal limits. Stable left lung. No acute osseous abnormality identified. IMPRESSION: 1. Stable right chest tube.  No pneumothorax. 2. Mildly lower lung volumes with increased right lung base opacity, favor atelectasis. Electronically Signed   By: Genevie Ann M.D.   On: 07/23/2019 09:06   Dg Chest Port 1 View  Result Date: 07/22/2019 CLINICAL DATA:  Post right VATS. EXAM: PORTABLE CHEST 1 VIEW COMPARISON:  Pelvis 04/06/2019 FINDINGS: The cardiac silhouette is normal. 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  Resolution of previously seen right pleural effusion. Right chest tube in place. No significant pneumothorax. IMPRESSION: 1. Resolution of previously seen right pleural effusion. 2. Right chest tube in place. No significant pneumothorax. Electronically Signed   By: Fidela Salisbury M.D.   On: 07/22/2019  10:32   Dg Chest Port 1 View  Result Date: 07/21/2019 CLINICAL DATA:  Shortness of breath history of lung surgery EXAM: PORTABLE CHEST 1 VIEW COMPARISON:  CT chest July 19, 2019, radiograph FINDINGS: Again noted is a moderate to large right-sided pleural effusion with hazy opacity in the right lung likely layering pleural effusion. There is a trace blunting of the left costophrenic angle. There is mildly increased pulmonary vasculature seen throughout. Cardiomediastinal silhouette is unchanged from prior exam. No acute osseous findings. IMPRESSION: 1. Moderate-to-large right pleural effusion with probable adjacent layering effusion. 2. Pulmonary vascular congestion Electronically Signed   By: Prudencio Pair M.D.   On: 07/21/2019 08:57   Ir Abdomen US Limited  Result Date: 07/24/2019 CLINICAL DATA:  58 year old with decompensated cirrhosis. Evaluate for ascites. EXAM: LIMITED ABDOMEN ULTRASOUND FOR ASCITES TECHNIQUE: Limited ultrasound survey for ascites was performed in all four abdominal quadrants. COMPARISON:  Chest CT 07/19/2019 FINDINGS: No significant fluid in the upper quadrants. Trace ascites in the right lower quadrant. No significant ascites in left lower quadrant. IMPRESSION: Trace ascites in the right lower quadrant. Paracentesis not performed. Electronically Signed   By: Markus Daft M.D.   On: 07/24/2019 16:33       Subjective: Patient seen and examined at bedside.  He feels much better and thinks a is ready to go home.  Denies worsening shortness of breath or worsening abdominal distention or abdominal pain.  No overnight fever or vomiting.  Discharge Exam: Vitals:   07/30/19 0422 07/30/19 0731  BP: 107/63 109/64  Pulse: 82 79  Resp: 18 18  Temp: 98.6 F (37 C) 97.9 F (36.6 C)  SpO2: 95% 95%    General: Pt is alert, awake, not in acute distress Cardiovascular: rate controlled, S1/S2 + Respiratory: bilateral decreased breath sounds at bases with some scattered basilar  crackles Abdominal: Soft, NT, distended, bowel sounds + Extremities: Trace edema, no cyanosis    The results of significant diagnostics from this hospitalization (including imaging, microbiology, ancillary and laboratory) are listed below for reference.     Microbiology: Recent Results (from the past 240 hour(s))  Fungus Culture With Stain     Status: None (Preliminary result)   Collection Time: 07/22/19  9:19 AM   Specimen: Pleural, Right; Body Fluid  Result Value Ref Range Status   Fungus Stain Final report  Final    Comment: (NOTE) Performed At: [facility] [place], [place] [id number] Rush Farmer MD OV:[id number]    Fungus (Mycology) Culture PENDING  Incomplete   Fungal Source PLEURAL  Final    Comment: RIGHT Performed at [facility], [place] [address], [place], [place] [address]   Acid Fast Smear (AFB)     Status: None   Collection Time: 07/22/19  9:19 AM   Specimen: Pleural, Right; Body Fluid  Result Value Ref Range Status   AFB Specimen Processing Concentration  Final   Acid Fast Smear Negative  Final    Comment: (NOTE) Performed At: [facility] [place], [place] [id number] Rush Farmer MD BS:[id number]    Source (AFB) PLEURAL  Final    Comment: RIGHT Performed at [facility], [place] [address], [place], [place]  [address]   Body fluid culture     Status: None   Collection Time: 07/22/19  9:19 AM   Specimen: Pleura  Result Value Ref Range Status   Specimen Description PLEURAL RIGHT  Final   Special Requests PATIENT ON FOLLOWING VANC  Final   Gram Stain   Final    MODERATE WBC PRESENT,BOTH PMN AND MONONUCLEAR NO ORGANISMS SEEN    Culture   Final    NO GROWTH 3 DAYS Performed at [facility], [address] [place], [place] [address]    Report Status 07/25/2019 FINAL  Final  Fungus Culture Result     Status: None   Collection Time: 07/22/19  9:19 AM  Result Value Ref Range Status   Result  1 Comment  Final    Comment: (NOTE) KOH/Calcofluor preparation:  no fungus observed. Performed At: [facility] [place], [place] [id number] Rush Farmer MD [id number]   Aerobic/Anaerobic Culture (surgical/deep wound)     Status: None   Collection Time: 07/22/19  9:27 AM   Specimen: Pleural, Right; Tissue  Result Value Ref Range Status   Specimen Description TISSUE RIGHT PLEURAL  Final   Special Requests PATIENT ON FOLLOWING VANC  Final   Gram Stain   Final    MODERATE WBC PRESENT,BOTH PMN AND MONONUCLEAR NO ORGANISMS SEEN    Culture   Final    No growth aerobically or anaerobically. Performed at [facility], [place] [address] [place], [place] [address]    Report Status 07/27/2019 FINAL  Final     Labs: BNP (last 3 results) No results for input(s): BNP in the last 8760 hours. Basic Metabolic Panel: Recent Labs  Lab 07/26/19 0253 07/27/19 0757 07/28/19 0257 07/29/19 0618 07/30/19 0414  NA 138 135 134* 134* 136  K 3.6 4.3 3.9 4.0 3.9  CL 101 101 100 99 101  CO2 26 24 24 25 25   GLUCOSE 126* 155* 136* 146* 147*  BUN 14 12 10 12 14   CREATININE 0.84 0.82 0.75 0.79 0.84  CALCIUM 8.8* 8.7* 8.7* 9.0 8.8*  MG 1.8 2.1  --   --   --    Liver Function Tests: Recent Labs  Lab 07/24/19 0722 07/30/19 0414  AST 44* 54*  ALT 37 51*  ALKPHOS 108 177*  BILITOT 1.5* 1.6*  PROT 4.9* 5.3*  ALBUMIN 2.6* 3.0*   No results for input(s): LIPASE, AMYLASE in the last 168 hours. No results for input(s): AMMONIA in the last 168 hours. CBC: Recent Labs  Lab 07/24/19 0722 07/25/19 0229 07/27/19 0757 07/30/19 0414  WBC 5.9 4.6 5.1 7.0  NEUTROABS  --   --   --  4.0  HGB 11.0* 10.2* 12.0* 12.4*  HCT 35.5* 33.4* 38.7* 38.3*  MCV 82.9 82.7 82.7 79.8*  PLT 114* 105* 133* 123*   Cardiac Enzymes: No results for input(s): CKTOTAL, CKMB, CKMBINDEX, TROPONINI in the last 168 hours. BNP: Invalid input(s): POCBNP CBG: Recent Labs  Lab 07/29/19 0613  07/29/19 1113 07/29/19 1638 07/29/19 2150 07/30/19 0625  GLUCAP 136* 239* 138* 234* 133*   D-Dimer No results for input(s): DDIMER in the last 72 hours. Hgb A1c No results for input(s): HGBA1C in the last 72 hours. Lipid Profile No results for input(s): CHOL, HDL, LDLCALC, TRIG, CHOLHDL, LDLDIRECT in the last 72 hours. Thyroid function studies No results for input(s): TSH, T4TOTAL, T3FREE, THYROIDAB in the last 72 hours.  Invalid input(s): FREET3 Anemia work up No results for  input(s): VITAMINB12, FOLATE, FERRITIN, TIBC, IRON, RETICCTPCT in the last 72 hours. Urinalysis    Component Value Date/Time   COLORURINE YELLOW 07/19/2019 1553   APPEARANCEUR HAZY (A) 07/19/2019 1553   LABSPEC 1.036 (H) 07/19/2019 1553   PHURINE 5.0 07/19/2019 1553   GLUCOSEU NEGATIVE 07/19/2019 1553   HGBUR SMALL (A) 07/19/2019 1553   BILIRUBINUR NEGATIVE 07/19/2019 1553   KETONESUR NEGATIVE 07/19/2019 1553   PROTEINUR NEGATIVE 07/19/2019 1553   NITRITE NEGATIVE 07/19/2019 1553   LEUKOCYTESUR TRACE (A) 07/19/2019 1553   Sepsis Labs Invalid input(s): PROCALCITONIN,  WBC,  LACTICIDVEN Microbiology Recent Results (from the past 240 hour(s))  Fungus Culture With Stain     Status: None (Preliminary result)   Collection Time: 07/22/19  9:19 AM   Specimen: Pleural, Right; Body Fluid  Result Value Ref Range Status   Fungus Stain Final report  Final    Comment: (NOTE) Performed At: [facility] [place], [place] [id number] Rush Farmer MD [id number]    Fungus (Mycology) Culture PENDING  Incomplete   Fungal Source PLEURAL  Final    Comment: RIGHT Performed at [facility], [place] [address] [place], [place] [address]   Acid Fast Smear (AFB)     Status: None   Collection Time: 07/22/19  9:19 AM   Specimen: Pleural, Right; Body Fluid  Result Value Ref Range Status   AFB Specimen Processing Concentration  Final   Acid Fast Smear Negative  Final    Comment:  (NOTE) Performed At: [facility] [place], [place] [id number] Rush Farmer MD [id number]    Source (AFB) PLEURAL  Final    Comment: RIGHT Performed at [facility], [place] [address] [place], [place] [address]   Body fluid culture     Status: None   Collection Time: 07/22/19  9:19 AM   Specimen: Pleura  Result Value Ref Range Status   Specimen Description PLEURAL RIGHT  Final   Special Requests PATIENT ON FOLLOWING VANC  Final   Gram Stain   Final    MODERATE WBC PRESENT,BOTH PMN AND MONONUCLEAR NO ORGANISMS SEEN    Culture   Final    NO GROWTH 3 DAYS Performed at [facility], [place] [address] [place], [place] [address]    Report Status 07/25/2019 FINAL  Final  Fungus Culture Result     Status: None   Collection Time: 07/22/19  9:19 AM  Result Value Ref Range Status   Result 1 Comment  Final    Comment: (NOTE) KOH/Calcofluor preparation:  no fungus observed. Performed At: [facility] [place], [place] [id number] Rush Farmer MD RS:[id number]   Aerobic/Anaerobic Culture (surgical/deep wound)     Status: None   Collection Time: 07/22/19  9:27 AM   Specimen: Pleural, Right; Tissue  Result Value Ref Range Status   Specimen Description TISSUE RIGHT PLEURAL  Final   Special Requests PATIENT ON FOLLOWING VANC  Final   Gram Stain   Final    MODERATE WBC PRESENT,BOTH PMN AND MONONUCLEAR NO ORGANISMS SEEN    Culture   Final    No growth aerobically or anaerobically. Performed at [facility], [place] [address] [place], [place] [address]    Report Status 07/27/2019 FINAL  Final     Time coordinating discharge: 35 minutes  SIGNED:   Aline August, MD  [facility] 07/30/2019, 10:39 AM

## 2019-07-30 NOTE — Progress Notes (Signed)
The proposed treatment discussed in cancer conference is for discussion purpose only and is not a binding recommendation. The patient was not physically examined nor present for their treatment options. Therefore, final treatment plans cannot be decided.  ?

## 2019-07-30 NOTE — Progress Notes (Signed)
D/C instructions given to pt. Medications and wound care reviewed. All questions answered. IV removed, clean and intact. Daughter to escort pt home.  Clyde Canterbury, RN

## 2019-08-06 ENCOUNTER — Other Ambulatory Visit: Payer: Self-pay | Admitting: Thoracic Surgery (Cardiothoracic Vascular Surgery)

## 2019-08-06 DIAGNOSIS — J90 Pleural effusion, not elsewhere classified: Secondary | ICD-10-CM

## 2019-08-07 ENCOUNTER — Ambulatory Visit
Admission: RE | Admit: 2019-08-07 | Discharge: 2019-08-07 | Disposition: A | Discharge status: Home / Self Care | Payer: BC Managed Care – PPO | Source: Ambulatory Visit | Attending: Thoracic Surgery (Cardiothoracic Vascular Surgery) | Admitting: Thoracic Surgery (Cardiothoracic Vascular Surgery)

## 2019-08-07 ENCOUNTER — Other Ambulatory Visit: Payer: Self-pay

## 2019-08-07 ENCOUNTER — Encounter: Payer: Self-pay | Admitting: Thoracic Surgery (Cardiothoracic Vascular Surgery)

## 2019-08-07 ENCOUNTER — Ambulatory Visit (INDEPENDENT_AMBULATORY_CARE_PROVIDER_SITE_OTHER): Payer: Self-pay | Admitting: Thoracic Surgery (Cardiothoracic Vascular Surgery)

## 2019-08-07 VITALS — BP 130/72 | HR 90 | Temp 98.5°F | Resp 20 | Ht 73.0 in | Wt 209.0 lb

## 2019-08-07 DIAGNOSIS — Z09 Encounter for follow-up examination after completed treatment for conditions other than malignant neoplasm: Secondary | ICD-10-CM

## 2019-08-07 DIAGNOSIS — J90 Pleural effusion, not elsewhere classified: Secondary | ICD-10-CM

## 2019-08-07 NOTE — Progress Notes (Signed)
      [address]       [place] [address]             [phone]        Lawrence Rogers Medical Record #[id number] Date of Birth: 18-Sep-1961  Referring: Orson Eva, MD Primary Care: [name], MD Primary Cardiologist:No primary care provider on file.  Reason for visit:   follow-up  History of Present Illness:     No complaints today.  Doing well at home.  Denies any shortness of breath     Physical Exam: BP 130/72   Pulse 90   Temp 98.5 F (36.9 C) (Skin)   Resp 20   Ht 6' 1"  (1.854 m)   Wt 209 lb (94.8 kg)   SpO2 96% Comment: RA  BMI 27.57 kg/m   Alert NAD RRR.  Incision clean, stitches removed Abdomen soft, distended no peripheral edema   Diagnostic Studies & Laboratory data: CXR: moderate right effusion Path: Diagnosis Pleura, peel, Right - FIBRIN AND ACUTE INFLAMMATION. 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  He has reaccumulated some fluid on the right.  Cultures from his VATS were negative Incisions are well healed He is scheduled to meet with his liver transplant physician. Continue management of his ascites  F/u prn   Lawrence Rogers 08/07/2019 2:22 PM

## 2019-08-21 LAB — FUNGAL ORGANISM REFLEX

## 2019-08-21 LAB — FUNGUS CULTURE WITH STAIN

## 2019-08-21 LAB — FUNGUS CULTURE RESULT
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
## 2019-08-31 ENCOUNTER — Other Ambulatory Visit: Payer: Self-pay

## 2019-08-31 ENCOUNTER — Ambulatory Visit (INDEPENDENT_AMBULATORY_CARE_PROVIDER_SITE_OTHER): Payer: BC Managed Care – PPO | Admitting: Nurse Practitioner

## 2019-08-31 ENCOUNTER — Encounter (INDEPENDENT_AMBULATORY_CARE_PROVIDER_SITE_OTHER): Payer: Self-pay | Admitting: *Deleted

## 2019-08-31 ENCOUNTER — Encounter (INDEPENDENT_AMBULATORY_CARE_PROVIDER_SITE_OTHER): Payer: Self-pay | Admitting: Nurse Practitioner

## 2019-08-31 VITALS — BP 125/74 | HR 103 | Temp 98.6°F | Ht 75.0 in | Wt 220.7 lb

## 2019-08-31 DIAGNOSIS — K7460 Unspecified cirrhosis of liver: Secondary | ICD-10-CM

## 2019-08-31 DIAGNOSIS — R188 Other ascites: Secondary | ICD-10-CM | POA: Diagnosis not present

## 2019-08-31 NOTE — Patient Instructions (Signed)
1. Complete the ordered blood tests  2. Schedule an abdomina ultrasound and paracentesis  3. 2gm Low sodium diet  4. No alcohol  5. No NSAIDs ie: IbuprofenAleve   6. Our office will facilitate consult with Dr. Dorcas Mcmurray at [facility]   7. Increase Protonix 71m one capsule twice daily

## 2019-09-01 LAB — COMPLETE METABOLIC PANEL WITH GFR
AG Ratio: 1.7 (calc) (ref 1.0–2.5)
ALT: 47 U/L — ABNORMAL HIGH (ref 9–46)
AST: 57 U/L — ABNORMAL HIGH (ref 10–35)
Albumin: 3.5 g/dL — ABNORMAL LOW (ref 3.6–5.1)
Alkaline phosphatase (APISO): 118 U/L (ref 35–144)
BUN: 12 mg/dL (ref 7–25)
CO2: 26 mmol/L (ref 20–32)
Calcium: 9.3 mg/dL (ref 8.6–10.3)
Chloride: 105 mmol/L (ref 98–110)
Creat: 0.85 mg/dL (ref 0.70–1.33)
GFR, Est African American: 112 mL/min/{1.73_m2} (ref >=60)
GFR, Est Non African American: 97 mL/min/{1.73_m2} (ref >=60)
Globulin: 2.1 g/dL (calc) (ref 1.9–3.7)
Glucose, Bld: 190 mg/dL — ABNORMAL HIGH (ref 65–139)
Potassium: 3.7 mmol/L (ref 3.5–5.3)
Sodium: 137 mmol/L (ref 135–146)
Total Bilirubin: 1.3 mg/dL — ABNORMAL HIGH (ref 0.2–1.2)
Total Protein: 5.6 g/dL — ABNORMAL LOW (ref 6.1–8.1)

## 2019-09-01 LAB — CBC WITH DIFFERENTIAL/PLATELET
Absolute Monocytes: 488 cells/uL (ref 200–950)
Basophils Absolute: 33 cells/uL (ref 0–200)
Basophils Relative: 0.5 %
Eosinophils Absolute: 112 cells/uL (ref 15–500)
Eosinophils Relative: 1.7 %
HCT: 35.2 % — ABNORMAL LOW (ref 38.5–50.0)
Hemoglobin: 11.2 g/dL — ABNORMAL LOW (ref 13.2–17.1)
Lymphs Abs: 1927 cells/uL (ref 850–3900)
MCH: 25.8 pg — ABNORMAL LOW (ref 27.0–33.0)
MCHC: 31.8 g/dL — ABNORMAL LOW (ref 32.0–36.0)
MCV: 81.1 fL (ref 80.0–100.0)
MPV: 11.2 fL (ref 7.5–12.5)
Monocytes Relative: 7.4 %
Neutro Abs: 4039 cells/uL (ref 1500–7800)
Neutrophils Relative %: 61.2 %
Platelets: 124 10*3/uL — ABNORMAL LOW (ref 140–400)
RBC: 4.34 10*6/uL (ref 4.20–5.80)
RDW: 15.7 % — ABNORMAL HIGH (ref 11.0–15.0)
Total Lymphocyte: 29.2 %
WBC: 6.6 10*3/uL (ref 3.8–10.8)

## 2019-09-01 LAB — HEPATITIS A ANTIBODY, IGM: Hep A IgM: NONREACTIVE

## 2019-09-01 LAB — HEPATITIS B SURFACE ANTIBODY,QUALITATIVE: Hep B S Ab: NONREACTIVE

## 2019-09-01 LAB — MITOCHONDRIAL ANTIBODIES: Mitochondrial M2 Ab, IgG: <=20 U

## 2019-09-01 LAB — AFP TUMOR MARKER: AFP-Tumor Marker: 3.8 ng/mL (ref <6.1)

## 2019-09-01 LAB — PROTIME-INR
INR: 1
Prothrombin Time: 10.6 s (ref 9.0–11.5)

## 2019-09-01 LAB — IGG: IgG (Immunoglobin G), Serum: 599 mg/dL — ABNORMAL LOW (ref 600–1640)

## 2019-09-01 LAB — HEPATITIS B CORE ANTIBODY, TOTAL: Hep B Core Total Ab: NONREACTIVE

## 2019-09-06 ENCOUNTER — Telehealth (INDEPENDENT_AMBULATORY_CARE_PROVIDER_SITE_OTHER): Payer: Self-pay | Admitting: Nurse Practitioner

## 2019-09-06 NOTE — Telephone Encounter (Signed)
Lawrence Rogers, patient should have paracentesis sooner than 09/23/2019 scheduled date. Please see if paracentesis can be done this week. Please let me know. thx

## 2019-09-07 LAB — ACID FAST CULTURE WITH REFLEXED SENSITIVITIES (MYCOBACTERIA): Acid Fast Culture: NEGATIVE

## 2019-09-07 NOTE — Telephone Encounter (Signed)
Patient requested this date when I scheduled him. I tried to get him to do sooner but he wouldn't

## 2019-09-07 NOTE — Telephone Encounter (Signed)
I called patient and left a msg on his voice mail to call our office to schedule the paracentesis within the next week, will need to make arrangements for an ECHO as well. Await pt's return call.

## 2019-09-08 ENCOUNTER — Telehealth (INDEPENDENT_AMBULATORY_CARE_PROVIDER_SITE_OTHER): Payer: Self-pay | Admitting: Nurse Practitioner

## 2019-09-08 NOTE — Telephone Encounter (Signed)
Patient came by office stated he couldn't change the date for his paracentesis - will keep 10/7

## 2019-09-08 NOTE — Telephone Encounter (Signed)
Patient left voice mail message wanting to know if it had been approved to extend his medication to double the amount he has been taking - would like a call back at [phone] after 3:30 pm

## 2019-09-08 NOTE — Telephone Encounter (Signed)
noted 

## 2019-09-09 NOTE — Telephone Encounter (Signed)
I called pt, he received his Pantoprazole 70m bid rx from his pharmacy. He will be out of town and he did not want to complete the paracentesis and sono until early Oct. 2020. He wishes to schedule the ECHO after he completes the paracentesis. He stated he has lost a few lbs, no LE edema, abd is not hard. He will call our office if he gains more than 5 lbs in 1 week or if his abd feels hard.

## 2019-09-13 NOTE — Telephone Encounter (Signed)
Lawrence Rogers, please call patient to schedule an office follow up appointment within 1 week of his paracentesis which is scheduled for 09/23/2019. ( He needs a follow up appt to verify correct dose of his diuretics, to schedule an ECHO, to schedule an EGD/colonoscopy and order Hep A and Hep B vaccinations). thx

## 2019-09-23 ENCOUNTER — Telehealth (INDEPENDENT_AMBULATORY_CARE_PROVIDER_SITE_OTHER): Payer: Self-pay | Admitting: Nurse Practitioner

## 2019-09-23 ENCOUNTER — Ambulatory Visit (HOSPITAL_COMMUNITY)
Admission: RE | Admit: 2019-09-23 | Discharge: 2019-09-23 | Disposition: A | Discharge status: Home / Self Care | Payer: BC Managed Care – PPO | Source: Ambulatory Visit | Attending: Nurse Practitioner | Admitting: Nurse Practitioner

## 2019-09-23 ENCOUNTER — Other Ambulatory Visit: Payer: Self-pay

## 2019-09-23 ENCOUNTER — Encounter (HOSPITAL_COMMUNITY): Payer: Self-pay

## 2019-09-23 DIAGNOSIS — R188 Other ascites: Secondary | ICD-10-CM | POA: Diagnosis present

## 2019-09-23 DIAGNOSIS — K7460 Unspecified cirrhosis of liver: Secondary | ICD-10-CM | POA: Diagnosis not present

## 2019-09-23 NOTE — Telephone Encounter (Signed)
Ann pls enter abdominal sonogram recall for 6 months thx

## 2019-09-23 NOTE — Telephone Encounter (Signed)
6 mth [place] Noted in recall

## 2019-10-04 IMAGING — US US PARACENTESIS
1 series · 5 of 5 positions shown · non-contrast
Comparison: Previous paracentesis

MEDICATIONS:
10 cc 1% lidocaine

COMPLICATIONS:
None immediate

INDICATION: Recurrent ascites

EXAM:
ULTRASOUND-GUIDED PARACENTESIS
TECHNIQUE: Informed written consent was obtained from the patient after a
discussion of the risks, benefits and alternatives to treatment. A
timeout was performed prior to the initiation of the procedure.

[Series 1: us paracentesis · 0.28mm/px · 5 of 5 slices shown]
[im 1/5]
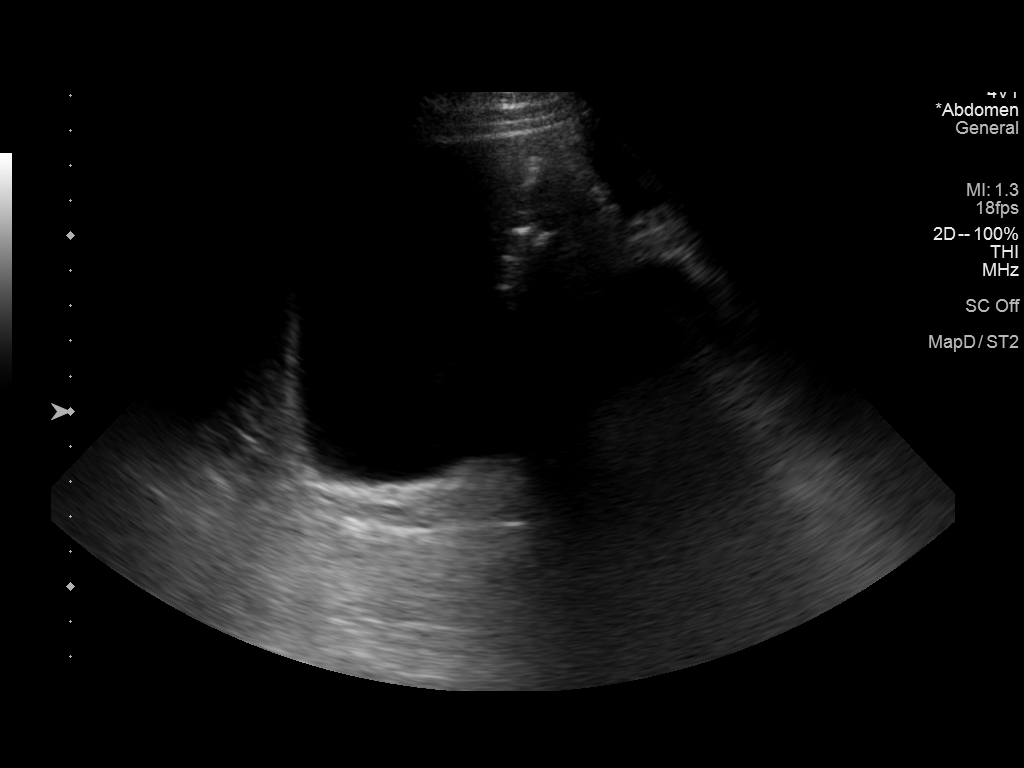
[im 2/5]
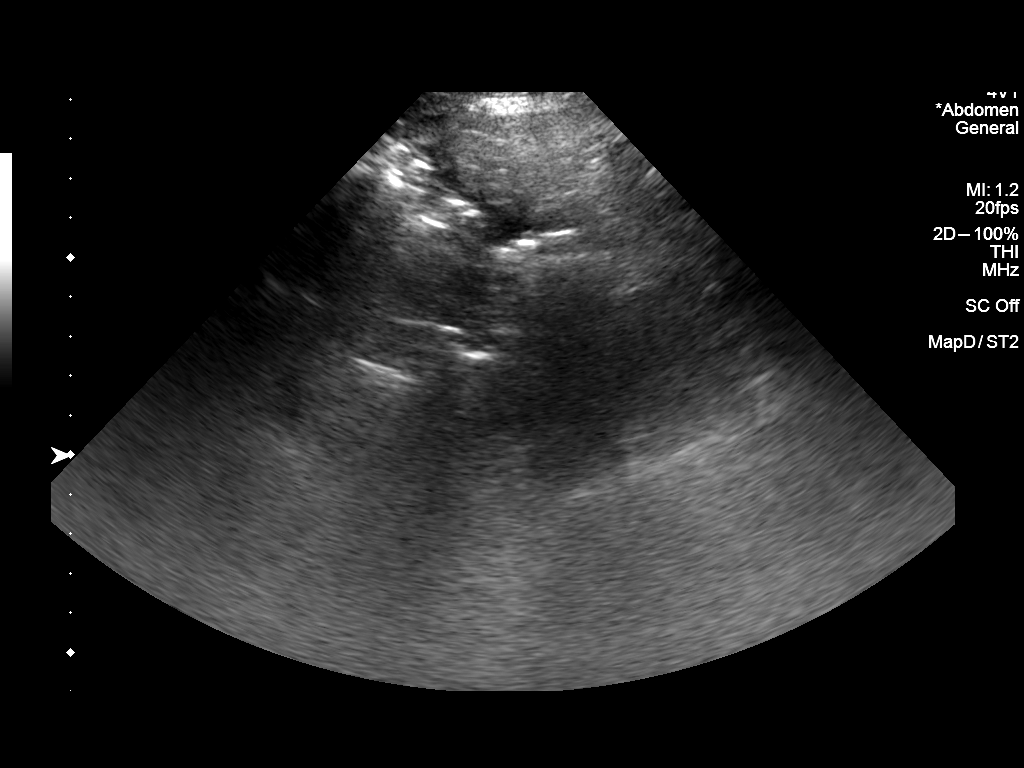
[im 3/5]
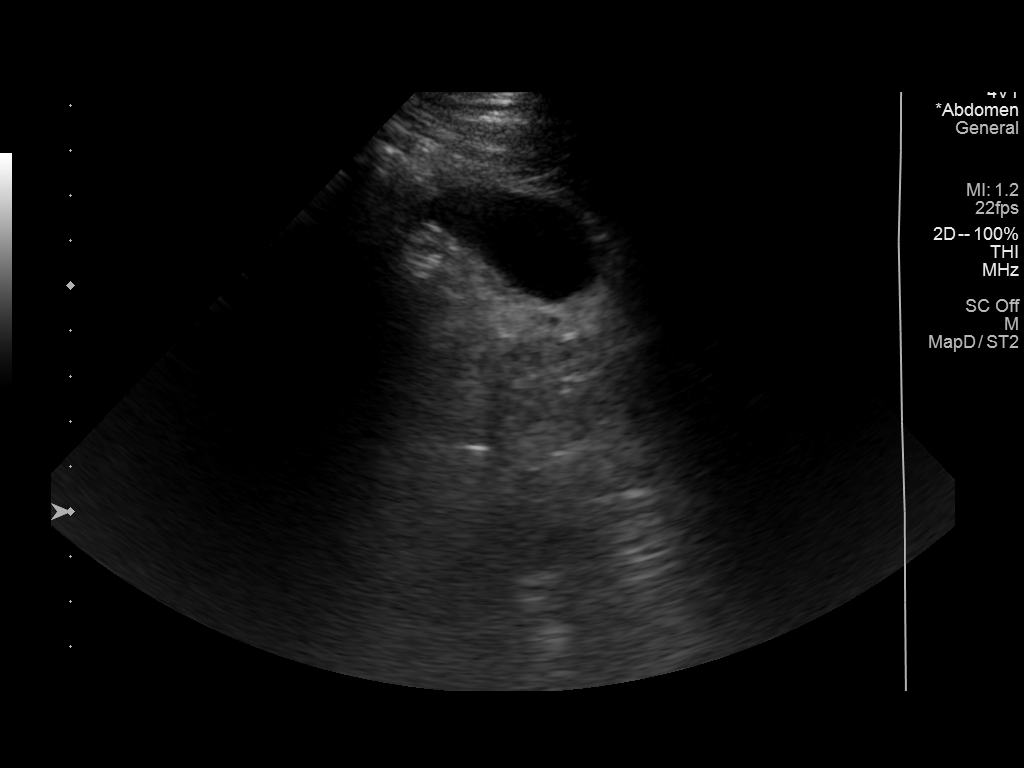
[im 4/5]
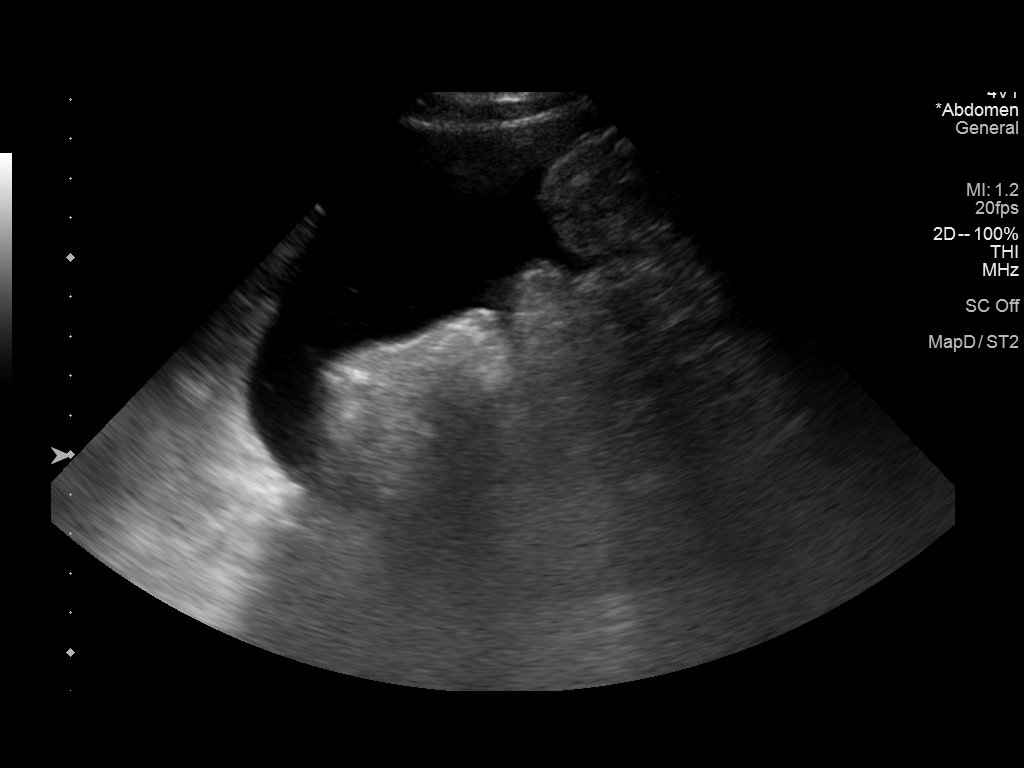
[im 5/5]
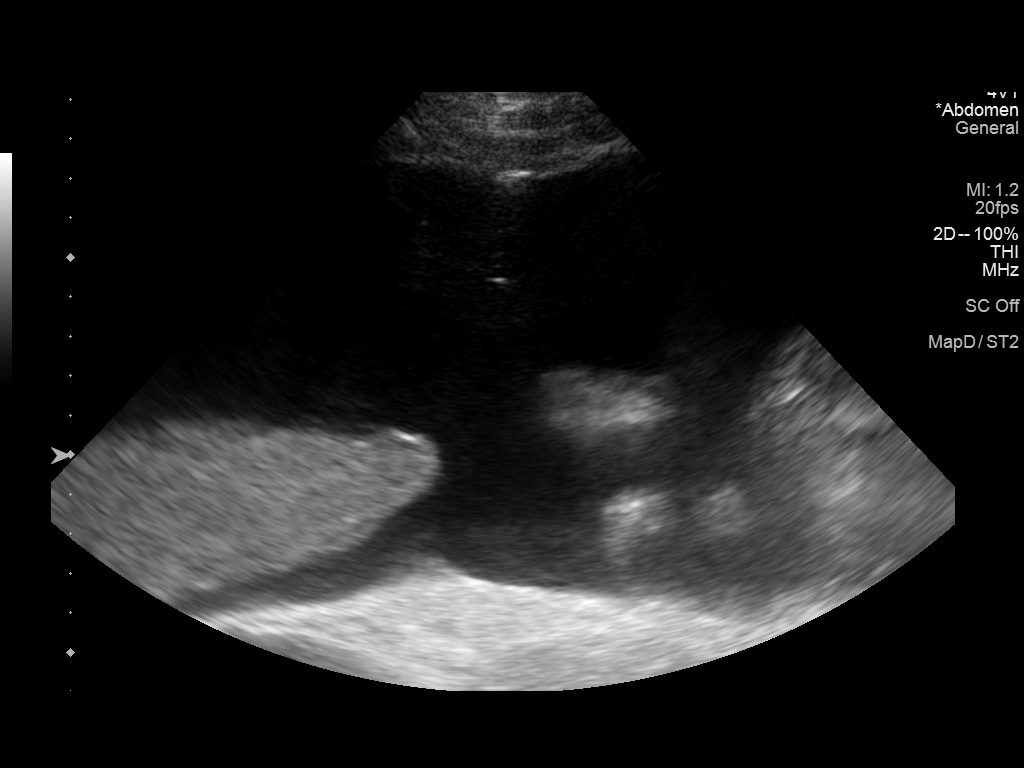

[5 of 5 positions shown; findings below may reference images not displayed]

Initial ultrasound scanning demonstrates a large amount of ascites
within the right lower abdominal quadrant. The right lower abdomen
was prepped and draped in the usual sterile fashion. 1% lidocaine
with epinephrine was used for local anesthesia. Under direct
ultrasound guidance, a 19 gauge, 7-cm, Yueh catheter was introduced.
An ultrasound image was saved for documentation purposed. The
paracentesis was performed. The catheter was removed and a dressing
was applied. The patient tolerated the procedure well without
immediate post procedural complication.
FINDINGS: A total of approximately 2.8 liters of yellow fluid was removed.
Samples were sent to the laboratory as requested by the clinical
team.
IMPRESSION: Successful ultrasound-guided paracentesis yielding 2.8 liters of
peritoneal fluid.

Read by

Burhaneddin Davids

## 2019-10-07 ENCOUNTER — Ambulatory Visit (INDEPENDENT_AMBULATORY_CARE_PROVIDER_SITE_OTHER): Payer: BC Managed Care – PPO | Admitting: Nurse Practitioner

## 2019-10-07 ENCOUNTER — Other Ambulatory Visit: Payer: Self-pay

## 2019-10-07 ENCOUNTER — Encounter (INDEPENDENT_AMBULATORY_CARE_PROVIDER_SITE_OTHER): Payer: Self-pay | Admitting: Nurse Practitioner

## 2019-10-07 ENCOUNTER — Other Ambulatory Visit (INDEPENDENT_AMBULATORY_CARE_PROVIDER_SITE_OTHER): Payer: Self-pay | Admitting: *Deleted

## 2019-10-07 VITALS — BP 99/66 | HR 98 | Temp 98.6°F | Ht >= 80 in | Wt 230.5 lb

## 2019-10-07 DIAGNOSIS — K7460 Unspecified cirrhosis of liver: Secondary | ICD-10-CM

## 2019-10-07 DIAGNOSIS — R188 Other ascites: Secondary | ICD-10-CM

## 2019-10-07 NOTE — Progress Notes (Signed)
Subjective:    Patient ID: Lawrence Rogers, male    DOB: 1961-04-28, 58 y.o.   MRN: [id number]  HPI Lawrence Rogers is a 58 y.o. male with a past medical history of arthritis, depression, DM II, GERD, Karlene Lineman cirrhosis, grade III esophageal varices, hepatic hydrothorax s/p right sided VATS 07/22/2019. Refer to his office visit 08/31/2019 for comprehensive history review. Repeat laboratory studies done  08/31/2019 showed an improved MELD score of 7 therefore he was not referred back to Dr. Manuella Ghazi at [facility]. A paracentesis was ordered at the time of his office visit, however, the patient delayed scheduling this procedure until 09/23/2019 and there was no longer enough ascites to tap. He presents today for further follow up. He complains of having increased abdominal girth, significant increase in lower extremity edema and a 10lb weight gain over the past 2 weeks. He reports eating a low sodium diet. He is taking Furosemide 50m QD and Spironolactone 2062mQD. An ECHO has not yet been done as he preferred to schedule the ECHO after completing the paracentesis. His work schedule limits his availability to schedule medical appointments. He denies having any chest pain, cough or SOB. No upper or lower abdominal pain. He is passing a normal brown formed stool once daily. No rectal bleeding or melena. He is scheduled to have left then right cataract surgery in November. He stated having Hepatitis A and Hepatitis B vaccinations 1 or 2 years ago. I will check labs to verify if he is immune if not he will require Hep A and Hep B  Booster vaccinations. He is due for a surveillance EGD. Never had a screening colonoscopy. No family history of colon cancer.    Labs 08/31/2019: Sodium 137.  Potassium 3.7.  BUN 12.  Creatinine 0.85.  Calcium 9.3.  AST 57.   ALT 47.  Alk phos 118.  Total bilirubin 1.3.  Albumin 3.5.  WBC 6.6.  Hemoglobin 11.2.  Hematocrit 35.2.  Platelet 124.  Globulin 2.1.  IgG 599.  INR 1.0.  AMA <  20.  aFP 3.8.  Abdominal sonogram 09/23/2019: Cirrhotic appearing liver with splenomegaly, spleen measuring 18.5 cm length with calculated volume of 1588 mL. No other definite upper abdominal sonographic abnormalities. Trace perihepatic ascites. Right pleural effusion.  EGD 10/24/2018:  - Grade III esophageal varices. Incompletely eradicated. Banded. - Scar in the distal esophagus. - Z-line regular, 43 cm from the incisors. - 2 cm hiatal hernia. - Portal hypertensive gastropathy. - Scar in the prepyloric region of the stomach. - Congested duodenal mucosa. - No specimens collected  Past Medical History:  Diagnosis Date  . Arthritis   . [place]  . Depression   . [facility]   Past Surgical History:  Procedure Laterality Date  . ESOPHAGEAL BANDING N/A 08/29/2018   Procedure: ESOPHAGEAL BANDING;  Surgeon: ReRogene HoustonMD;  [facility];  Service: Endoscopy;  Laterality: N/A;  . ESOPHAGEAL BANDING  10/24/2018   Procedure: ESOPHAGEAL BANDING;  Surgeon: ReRogene HoustonMD;  [facility];  Service: Endoscopy;;  3 bands  . ESOPHAGOGASTRODUODENOSCOPY (EGD) WITH PROPOFOL N/A 08/29/2018   Procedure: ESOPHAGOGASTRODUODENOSCOPY (EGD) WITH PROPOFOL;  Surgeon: ReRogene HoustonMD;  [facility];  Service: Endoscopy;  Laterality: N/A;  9:30  . ESOPHAGOGASTRODUODENOSCOPY (EGD) WITH PROPOFOL N/A 10/24/2018   Procedure: ESOPHAGOGASTRODUODENOSCOPY (EGD) WITH PROPOFOL;  Surgeon: ReRogene HoustonMD;  [facility];  Service: Endoscopy;  Laterality: N/A;  7:30  .  IR PARACENTESIS  04/18/2018  . IR TRANSCATHETER BX  04/18/2018  . IR US GUIDE VASC ACCESS RIGHT  04/18/2018  . IR VENOGRAM HEPATIC W HEMODYNAMIC EVALUATION  04/18/2018  . PLEURAL EFFUSION DRAINAGE Right 07/22/2019   Procedure: Drainage Of Pleural Effusion;  Surgeon: Lajuana Matte, MD;  Location: [place];  Service: Thoracic;  Laterality: Right;  Marland Kitchen VIDEO ASSISTED THORACOSCOPY (VATS)/DECORTICATION  Right 07/22/2019   Procedure: VIDEO ASSISTED THORACOSCOPY (VATS)/DECORTICATION;  Surgeon: Lajuana Matte, MD;  [facility];  Service: Thoracic;  Laterality: Right;   Current Outpatient Medications on File Prior to Visit  Medication Sig Dispense Refill  . furosemide (LASIX) 40 MG tablet Take 80 mg by mouth daily.     Marland Kitchen glipiZIDE (GLUCOTROL) 5 MG tablet Take 5 mg by mouth daily before breakfast.    . metFORMIN (GLUCOPHAGE-XR) 500 MG 24 hr tablet Take 1,000 mg by mouth 2 (two) times daily.  2  . pantoprazole (PROTONIX) 40 MG tablet TAKE ONE TABLET (40MG TOTAL) BY MOUTH DAILY BEFORE BREAKFAST 30 tablet 5  . spironolactone (ALDACTONE) 100 MG tablet Take 2 tablets (200 mg total) by mouth daily.    . traMADol (ULTRAM) 50 MG tablet Take 100 mg by mouth every 6 (six) hours as needed.     . triamcinolone cream (KENALOG) 0.5 % Apply 1 application topically daily as needed (irritation).   1   No current facility-administered medications on file prior to visit.    No Known Allergies   Review of Systems see HPI, all other systems reviewed and are negative    Objective:   Physical Exam BP 99/66   Pulse 98   Temp 98.6 F (37 C) (Oral)   Ht 6' 8" (2.032 m)   Wt 230 lb 8 oz (104.6 kg)   BMI 25.32 kg/m   General: 58 year old male alert and oriented in no acute distress Eyes: Sclera nonicteric, conjunctiva pink Mouth: Missing dentition, no ulcers or lesions Neck: Supple, no lymphadenopathy or thyromegaly Heart: Regular rate and rhythm, no murmurs Lungs: Breath sounds clear throughout slightly diminished in the right lower lobe Abdomen: Moderately distended with positive fluid wave consistent with ascites however his abdomen is not tense, somewhat gaseous distention, tympanic to the central abdomen to percussion otherwise dull to percussion, positive bowel sounds to all 4 quadrants Extremities: Bilateral lower extremities with 2+ pitting edema Neuro: Alert and oriented x4, no focal deficits,  no asterixis    Assessment & Plan:    1. Decompensated NASH Cirrhosis with ascites. 10lb weight gain. MELD 7. -Repeat labs prior to increasing doses of Furosemide and Spironolactone -2gm low sodium diet -Reschedule paracentesis asap, labs to include cell ct with diff, gram stain, cytology, aerobic/anarobic culture, albumin and protein levels. -CMP, Hep A total ab, Heb B surf ab, CBC, PT/INR -Patient to call our office if he gains 5 lbs in 5 to 7 days -No plans for TIPS at this time  2. Esophageal varices. Patient is no longer on Nadolol. -Surveillance EGD, benefits and risks discussed including risk with sedation, risk of bleeding, perforation and infection  3. Anemia, thrombocytopenia secondary to cirrhosis. No evidence of active GI bleeding.   4. Right pleural effusion (Hepatic Hydrothorax) s/p Right sided VATS 07/22/2019. Abdominal sonogram 09/23/2019 shows right pleural effusion, he is asymptomatic, to follow up with thoracic surgeon  Dr. Melodie Bouillon.  5. Colon cancer screening -schedule a colonoscopy at the time of his surveillance EGD, benefits and risks discussed   6. Nausea, HX of  GERD -Continue Pantoprazole 62m bid  -EGD  7. DM II  Further follow up to be determined after repeat labs, ECHO and EGD/colonoscopy completed Follow up in the office in 2 months and as needed

## 2019-10-07 NOTE — Patient Instructions (Signed)
1.  Schedule a paracentesis  2.  Schedule EGD and colonoscopy  3.  Complete the ordered blood test today  4.  Low sodium diet  5.  His weight yourself daily at home, if you have gained more than 5 pounds in 5 to 7 days call our office  6.  Our office will contact you facilitate scheduling an echocardiogram your heart  Lawrence Rogers.  Follow-up in the office in 2 months and as needed

## 2019-10-08 ENCOUNTER — Other Ambulatory Visit (INDEPENDENT_AMBULATORY_CARE_PROVIDER_SITE_OTHER): Payer: Self-pay | Admitting: Nurse Practitioner

## 2019-10-08 DIAGNOSIS — R600 Localized edema: Secondary | ICD-10-CM

## 2019-10-08 DIAGNOSIS — R188 Other ascites: Secondary | ICD-10-CM

## 2019-10-08 LAB — COMPLETE METABOLIC PANEL WITH GFR
AG Ratio: 1.9 (calc) (ref 1.0–2.5)
ALT: 32 U/L (ref 9–46)
AST: 38 U/L — ABNORMAL HIGH (ref 10–35)
Albumin: 3.5 g/dL — ABNORMAL LOW (ref 3.6–5.1)
Alkaline phosphatase (APISO): 94 U/L (ref 35–144)
BUN: 13 mg/dL (ref 7–25)
CO2: 25 mmol/L (ref 20–32)
Calcium: 9.3 mg/dL (ref 8.6–10.3)
Chloride: 105 mmol/L (ref 98–110)
Creat: 0.9 mg/dL (ref 0.70–1.33)
GFR, Est African American: 109 mL/min/{1.73_m2} (ref >=60)
GFR, Est Non African American: 94 mL/min/{1.73_m2} (ref >=60)
Globulin: 1.8 g/dL (calc) — ABNORMAL LOW (ref 1.9–3.7)
Glucose, Bld: 136 mg/dL (ref 65–139)
Potassium: 4.3 mmol/L (ref 3.5–5.3)
Sodium: 138 mmol/L (ref 135–146)
Total Bilirubin: 1 mg/dL (ref 0.2–1.2)
Total Protein: 5.3 g/dL — ABNORMAL LOW (ref 6.1–8.1)

## 2019-10-08 LAB — CBC WITH DIFFERENTIAL/PLATELET
Absolute Monocytes: 426 cells/uL (ref 200–950)
Basophils Absolute: 28 cells/uL (ref 0–200)
Basophils Relative: 0.5 %
Eosinophils Absolute: 73 cells/uL (ref 15–500)
Eosinophils Relative: 1.3 %
HCT: 29.6 % — ABNORMAL LOW (ref 38.5–50.0)
Hemoglobin: 9.5 g/dL — ABNORMAL LOW (ref 13.2–17.1)
Lymphs Abs: 1422 cells/uL (ref 850–3900)
MCH: 25.3 pg — ABNORMAL LOW (ref 27.0–33.0)
MCHC: 32.1 g/dL (ref 32.0–36.0)
MCV: 78.7 fL — ABNORMAL LOW (ref 80.0–100.0)
MPV: 11 fL (ref 7.5–12.5)
Monocytes Relative: 7.6 %
Neutro Abs: 3651 cells/uL (ref 1500–7800)
Neutrophils Relative %: 65.2 %
Platelets: 101 10*3/uL — ABNORMAL LOW (ref 140–400)
RBC: 3.76 10*6/uL — ABNORMAL LOW (ref 4.20–5.80)
RDW: 14.8 % (ref 11.0–15.0)
Total Lymphocyte: 25.4 %
WBC: 5.6 10*3/uL (ref 3.8–10.8)

## 2019-10-08 LAB — BILIRUBIN, DIRECT: Bilirubin, Direct: 0.2 mg/dL (ref 0.0–0.2)

## 2019-10-08 LAB — PROTIME-INR
INR: 1
Prothrombin Time: 10.9 s (ref 9.0–11.5)

## 2019-10-08 LAB — HEPATITIS A ANTIBODY, TOTAL: Hepatitis A AB,Total: REACTIVE — AB

## 2019-10-08 LAB — HEPATITIS B SURFACE ANTIBODY,QUALITATIVE: Hep B S Ab: NONREACTIVE

## 2019-10-09 ENCOUNTER — Telehealth (INDEPENDENT_AMBULATORY_CARE_PROVIDER_SITE_OTHER): Payer: Self-pay | Admitting: *Deleted

## 2019-10-09 ENCOUNTER — Encounter (INDEPENDENT_AMBULATORY_CARE_PROVIDER_SITE_OTHER): Payer: Self-pay | Admitting: *Deleted

## 2019-10-09 DIAGNOSIS — Z1211 Encounter for screening for malignant neoplasm of colon: Secondary | ICD-10-CM

## 2019-10-09 MED ORDER — SUPREP BOWEL PREP KIT 17.5-3.13-1.6 GM/177ML PO SOLN: 1.0000 | Freq: Once | ORAL | 0 refills | Status: AC

## 2019-10-09 NOTE — Telephone Encounter (Signed)
Patient needs suprep TCS/EGD 12/18

## 2019-10-14 ENCOUNTER — Other Ambulatory Visit (INDEPENDENT_AMBULATORY_CARE_PROVIDER_SITE_OTHER): Payer: Self-pay | Admitting: Nurse Practitioner

## 2019-10-14 ENCOUNTER — Other Ambulatory Visit: Payer: Self-pay

## 2019-10-14 ENCOUNTER — Ambulatory Visit (HOSPITAL_COMMUNITY)
Admission: RE | Admit: 2019-10-14 | Discharge: 2019-10-14 | Disposition: A | Discharge status: Home / Self Care | Payer: BC Managed Care – PPO | Source: Ambulatory Visit | Attending: Nurse Practitioner | Admitting: Nurse Practitioner

## 2019-10-14 DIAGNOSIS — K7460 Unspecified cirrhosis of liver: Secondary | ICD-10-CM | POA: Diagnosis not present

## 2019-10-14 DIAGNOSIS — R188 Other ascites: Secondary | ICD-10-CM | POA: Diagnosis present

## 2019-10-16 ENCOUNTER — Telehealth (INDEPENDENT_AMBULATORY_CARE_PROVIDER_SITE_OTHER): Payer: Self-pay | Admitting: Nurse Practitioner

## 2019-10-16 NOTE — Telephone Encounter (Signed)
Dr. Laural Golden, pls see lab results with H/H drifting downward, lab results forwarded to you. EGD and colonoscopy ordered but not scheduled until 12/04/2019. He should have earlier procedure anemia. He had a repeat abd sono and still not enough ascites to tap. Pls see last office visit with increasing LE edema. Please adjust diuretics after you review labs as we previously discussed. I ordered an ECHO but I don't see that is was scheduled yet. Patient has limited time off work to complete medical appts.

## 2019-10-19 ENCOUNTER — Other Ambulatory Visit (INDEPENDENT_AMBULATORY_CARE_PROVIDER_SITE_OTHER): Payer: Self-pay | Admitting: Internal Medicine

## 2019-10-19 ENCOUNTER — Other Ambulatory Visit (HOSPITAL_COMMUNITY): Payer: BC Managed Care – PPO

## 2019-10-19 DIAGNOSIS — D500 Iron deficiency anemia secondary to blood loss (chronic): Secondary | ICD-10-CM

## 2019-10-19 DIAGNOSIS — K7469 Other cirrhosis of liver: Secondary | ICD-10-CM

## 2019-10-19 MED ORDER — FLINTSTONES W/IRON 18 MG PO CHEW: 1.0000 | CHEWABLE_TABLET | Freq: Two times a day (BID) | ORAL | Status: DC

## 2019-10-19 NOTE — Telephone Encounter (Signed)
Talked with patient. Since his office visit he has lost 3 pounds. He is not having any shortness of breath. He states Dr. Karie Kirks checked his stool and was" trace positive" for blood. He denies melena or rectal bleeding. His echo is scheduled for 10/21/2019. I asked patient to take [place] chewable with iron 2 tablets a day or ferrous sulfate 325 mg 1 daily. We will go to the lab for CBC serum iron TIBC and ferritin later this week or on 10/25/2019. If hemoglobin remains low will expedite EGD and colonoscopy.

## 2019-10-20 NOTE — H&P (Signed)
Surgical History & Physical  Patient Name: Lawrence Rogers DOB: [birth year]  Surgery: Cataract extraction with intraocular lens implant phacoemulsification; Left Eye  Surgeon: Baruch Goldmann MD Surgery Date:  10/26/2019 Pre-Op Date:  10/19/2019  HPI: A 61 Yr. old male patient 1. 1. The patient complains of difficulty when viewing TV, reading closed caption, news scrolls on TV, which began 1 year ago. The left eye is affected. The episode is gradual. Symptoms occur when the patient is driving, inside and outside. The complaint is associated with glare. This is negatively affecting the patient's quality of life. HPI Completed by Dr. Baruch Goldmann  Medical History: Diabetes  Review of Systems Negative Allergic/Immunologic Negative Cardiovascular Negative Constitutional Negative Ear, Nose, Mouth & Throat Negative Endocrine Negative Eyes Negative Gastrointestinal Negative Genitourinary Negative Hemotologic/Lymphatic Negative Integumentary Negative Musculoskeletal Negative Neurological Negative Psychiatry Negative Respiratory  Social   Former smoker  Medication Metformin, Spironolactone, Furosemide, Glimeperide, Pantoprazole, Tramadol hydrochloride,   Sx/Procedures  None  Drug Allergies   NKDA  History & Physical: Heent:  Cataract, Left eye NECK: supple without bruits LUNGS: lungs clear to auscultation CV: regular rate and rhythm Abdomen: soft and non-tender Impression & Plan: Assessment: 1.  COMBINED FORMS AGE RELATED CATARACT; Both Eyes (H25.813)  Plan: 1.  Cataract accounts for the patient's decreased vision. This visual impairment is not correctable with a tolerable change in glasses or contact lenses. Cataract surgery with an implantation of a new lens should significantly improve the visual and functional status of the patient. Discussed all risks, benefits, alternatives, and potential complications. Discussed the procedures and recovery. Patient desires to have  surgery. A-scan ordered and performed today for intra-ocular lens calculations. The surgery will be performed in order to improve vision for driving, reading, and for eye examinations. Recommend phacoemulsification with intra-ocular lens. Left Eye worse - first. Dilates well - shugarcaine by protocol.

## 2019-10-20 NOTE — Telephone Encounter (Signed)
Noted  

## 2019-10-21 ENCOUNTER — Other Ambulatory Visit: Payer: Self-pay

## 2019-10-21 ENCOUNTER — Ambulatory Visit (HOSPITAL_COMMUNITY)
Admission: RE | Admit: 2019-10-21 | Discharge: 2019-10-21 | Disposition: A | Discharge status: Home / Self Care | Payer: BC Managed Care – PPO | Source: Ambulatory Visit | Attending: Nurse Practitioner | Admitting: Nurse Practitioner

## 2019-10-21 DIAGNOSIS — R188 Other ascites: Secondary | ICD-10-CM | POA: Diagnosis not present

## 2019-10-21 DIAGNOSIS — R600 Localized edema: Secondary | ICD-10-CM | POA: Insufficient documentation

## 2019-10-21 NOTE — Progress Notes (Signed)
*  PRELIMINARY RESULTS* Echocardiogram 2D Echocardiogram has been performed.  Lawrence Rogers 10/21/2019, 3:38 PM

## 2019-10-23 ENCOUNTER — Other Ambulatory Visit (HOSPITAL_COMMUNITY)
Admission: RE | Admit: 2019-10-23 | Discharge: 2019-10-23 | Disposition: A | Discharge status: Home / Self Care | Payer: BC Managed Care – PPO | Source: Ambulatory Visit | Attending: Ophthalmology | Admitting: Ophthalmology

## 2019-10-23 ENCOUNTER — Encounter (HOSPITAL_COMMUNITY)
Admission: RE | Admit: 2019-10-23 | Discharge: 2019-10-23 | Disposition: A | Discharge status: Home / Self Care | Payer: BC Managed Care – PPO | Source: Ambulatory Visit | Attending: Ophthalmology | Admitting: Ophthalmology

## 2019-10-23 ENCOUNTER — Other Ambulatory Visit: Payer: Self-pay

## 2019-10-23 DIAGNOSIS — Z01812 Encounter for preprocedural laboratory examination: Secondary | ICD-10-CM | POA: Insufficient documentation

## 2019-10-23 DIAGNOSIS — Z20828 Contact with and (suspected) exposure to other viral communicable diseases: Secondary | ICD-10-CM | POA: Insufficient documentation

## 2019-10-23 LAB — CBC
HCT: 32 % — ABNORMAL LOW (ref 38.5–50.0)
Hemoglobin: 9.9 g/dL — ABNORMAL LOW (ref 13.2–17.1)
MCH: 24.6 pg — ABNORMAL LOW (ref 27.0–33.0)
MCHC: 30.9 g/dL — ABNORMAL LOW (ref 32.0–36.0)
MCV: 79.6 fL — ABNORMAL LOW (ref 80.0–100.0)
MPV: 11.2 fL (ref 7.5–12.5)
Platelets: 115 10*3/uL — ABNORMAL LOW (ref 140–400)
RBC: 4.02 10*6/uL — ABNORMAL LOW (ref 4.20–5.80)
RDW: 14.8 % (ref 11.0–15.0)
WBC: 4.9 10*3/uL (ref 3.8–10.8)

## 2019-10-23 LAB — FERRITIN: Ferritin: 14 ng/mL — ABNORMAL LOW (ref 38–380)

## 2019-10-23 LAB — IRON, TOTAL/TOTAL IRON BINDING CAP
%SAT: 7 % (calc) — ABNORMAL LOW (ref 20–48)
Iron: 31 ug/dL — ABNORMAL LOW (ref 50–180)
TIBC: 434 mcg/dL (calc) — ABNORMAL HIGH (ref 250–425)

## 2019-10-23 LAB — SARS CORONAVIRUS 2 (TAT 6-24 HRS): SARS Coronavirus 2: NEGATIVE

## 2019-10-25 ENCOUNTER — Other Ambulatory Visit (INDEPENDENT_AMBULATORY_CARE_PROVIDER_SITE_OTHER): Payer: Self-pay | Admitting: Internal Medicine

## 2019-10-25 MED ORDER — FERROUS SULFATE 325 (65 FE) MG PO TABS: 325.0000 mg | ORAL_TABLET | Freq: Every day | ORAL | Status: DC

## 2019-10-26 ENCOUNTER — Ambulatory Visit (HOSPITAL_COMMUNITY)
Admission: RE | Admit: 2019-10-26 | Discharge: 2019-10-26 | Disposition: A | Discharge status: Home / Self Care | Payer: BC Managed Care – PPO | Source: Ambulatory Visit | Attending: Ophthalmology | Admitting: Ophthalmology

## 2019-10-26 ENCOUNTER — Other Ambulatory Visit: Payer: Self-pay

## 2019-10-26 ENCOUNTER — Encounter (HOSPITAL_COMMUNITY): Payer: Self-pay

## 2019-10-26 ENCOUNTER — Encounter (HOSPITAL_COMMUNITY)
Admission: RE | Disposition: A | Discharge status: Home / Self Care | Payer: Self-pay | Source: Ambulatory Visit | Attending: Ophthalmology

## 2019-10-26 ENCOUNTER — Ambulatory Visit (HOSPITAL_COMMUNITY): Payer: BC Managed Care – PPO | Admitting: Certified Registered Nurse Anesthetist

## 2019-10-26 DIAGNOSIS — F329 Major depressive disorder, single episode, unspecified: Secondary | ICD-10-CM | POA: Diagnosis not present

## 2019-10-26 DIAGNOSIS — R188 Other ascites: Secondary | ICD-10-CM | POA: Insufficient documentation

## 2019-10-26 DIAGNOSIS — H25812 Combined forms of age-related cataract, left eye: Secondary | ICD-10-CM | POA: Diagnosis not present

## 2019-10-26 DIAGNOSIS — E1136 Type 2 diabetes mellitus with diabetic cataract: Secondary | ICD-10-CM | POA: Insufficient documentation

## 2019-10-26 DIAGNOSIS — Z7984 Long term (current) use of oral hypoglycemic drugs: Secondary | ICD-10-CM | POA: Diagnosis not present

## 2019-10-26 DIAGNOSIS — Z8711 Personal history of peptic ulcer disease: Secondary | ICD-10-CM | POA: Insufficient documentation

## 2019-10-26 DIAGNOSIS — Z79899 Other long term (current) drug therapy: Secondary | ICD-10-CM | POA: Insufficient documentation

## 2019-10-26 DIAGNOSIS — K7460 Unspecified cirrhosis of liver: Secondary | ICD-10-CM

## 2019-10-26 DIAGNOSIS — K745 Biliary cirrhosis, unspecified: Secondary | ICD-10-CM | POA: Insufficient documentation

## 2019-10-26 DIAGNOSIS — M1990 Unspecified osteoarthritis, unspecified site: Secondary | ICD-10-CM | POA: Diagnosis not present

## 2019-10-26 DIAGNOSIS — Z87891 Personal history of nicotine dependence: Secondary | ICD-10-CM | POA: Insufficient documentation

## 2019-10-26 HISTORY — PX: CATARACT EXTRACTION W/PHACO: SHX586

## 2019-10-26 LAB — GLUCOSE, CAPILLARY: Glucose-Capillary: 96 mg/dL (ref 70–99)

## 2019-10-26 SURGERY — PHACOEMULSIFICATION, CATARACT, WITH IOL INSERTION
Anesthesia: Monitor Anesthesia Care | Site: Eye | Laterality: Left

## 2019-10-26 MED ORDER — CHLORHEXIDINE GLUCONATE CLOTH 2 % EX PADS: 6.0000 | MEDICATED_PAD | Freq: Once | CUTANEOUS | Status: DC

## 2019-10-26 MED ORDER — EPINEPHRINE PF 1 MG/ML IJ SOLN
INTRAMUSCULAR | Status: AC
  Filled 2019-10-26: qty 1

## 2019-10-26 MED ORDER — BSS IO SOLN
INTRAOCULAR | Status: DC | PRN
  Administered 2019-10-26: 15 mL

## 2019-10-26 MED ORDER — LIDOCAINE HCL 3.5 % OP GEL: 1.0000 "application " | Freq: Once | OPHTHALMIC | Status: DC

## 2019-10-26 MED ORDER — MIDAZOLAM HCL 5 MG/5ML IJ SOLN: INTRAMUSCULAR | Status: DC | PRN

## 2019-10-26 MED ORDER — CYCLOPENTOLATE-PHENYLEPHRINE 0.2-1 % OP SOLN
1.0000 [drp] | OPHTHALMIC | Status: AC | PRN
  Administered 2019-10-26 (×3): 1 [drp] via OPHTHALMIC

## 2019-10-26 MED ORDER — SODIUM HYALURONATE 23 MG/ML IO SOLN
INTRAOCULAR | Status: DC | PRN
  Administered 2019-10-26: 0.6 mL via INTRAOCULAR

## 2019-10-26 MED ORDER — NEOMYCIN-POLYMYXIN-DEXAMETH 3.5-10000-0.1 OP SUSP
OPHTHALMIC | Status: DC | PRN
  Administered 2019-10-26: 2 [drp] via OPHTHALMIC

## 2019-10-26 MED ORDER — MIDAZOLAM HCL 2 MG/2ML IJ SOLN
INTRAMUSCULAR | Status: AC
  Filled 2019-10-26: qty 2

## 2019-10-26 MED ORDER — TETRACAINE HCL 0.5 % OP SOLN
1.0000 [drp] | OPHTHALMIC | Status: AC | PRN
  Administered 2019-10-26 (×3): 1 [drp] via OPHTHALMIC

## 2019-10-26 MED ORDER — LIDOCAINE HCL (PF) 1 % IJ SOLN
INTRAOCULAR | Status: DC | PRN
  Administered 2019-10-26: 13:00:00 1 mL via OPHTHALMIC

## 2019-10-26 MED ORDER — PHENYLEPHRINE HCL 2.5 % OP SOLN
1.0000 [drp] | OPHTHALMIC | Status: AC | PRN
  Administered 2019-10-26 (×3): 1 [drp] via OPHTHALMIC

## 2019-10-26 MED ORDER — POVIDONE-IODINE 5 % OP SOLN
OPHTHALMIC | Status: DC | PRN
  Administered 2019-10-26: 1 via OPHTHALMIC

## 2019-10-26 MED ORDER — PROVISC 10 MG/ML IO SOLN
INTRAOCULAR | Status: DC | PRN
  Administered 2019-10-26: 0.85 mL via INTRAOCULAR

## 2019-10-26 MED ORDER — EPINEPHRINE PF 1 MG/ML IJ SOLN
INTRAOCULAR | Status: DC | PRN
  Administered 2019-10-26: 13:00:00 500 mL

## 2019-10-26 SURGICAL SUPPLY — 16 items
CLOTH BEACON ORANGE TIMEOUT ST (SAFETY) ×3 IMPLANT
EYE SHIELD UNIVERSAL CLEAR (GAUZE/BANDAGES/DRESSINGS) ×3 IMPLANT
GLOVE BIOGEL PI IND STRL 6.5 (GLOVE) ×1 IMPLANT
GLOVE BIOGEL PI IND STRL 7.0 (GLOVE) ×2 IMPLANT
GLOVE BIOGEL PI INDICATOR 6.5 (GLOVE) ×2
GLOVE BIOGEL PI INDICATOR 7.0 (GLOVE) ×4
GOWN STRL REUS W/ TWL LRG LVL3 (GOWN DISPOSABLE) ×1 IMPLANT
GOWN STRL REUS W/TWL LRG LVL3 (GOWN DISPOSABLE) ×2
LENS ALC ACRYL/TECN (Ophthalmic Related) ×3 IMPLANT
NEEDLE HYPO 18GX1.5 BLUNT FILL (NEEDLE) ×3 IMPLANT
PAD ARMBOARD 7.5X6 YLW CONV (MISCELLANEOUS) ×3 IMPLANT
SYR TB 1ML LL NO SAFETY (SYRINGE) ×3 IMPLANT
TAPE SURG TRANSPORE 1 IN (GAUZE/BANDAGES/DRESSINGS) ×1 IMPLANT
TAPE SURGICAL TRANSPORE 1 IN (GAUZE/BANDAGES/DRESSINGS) ×2
VISCOELASTIC ADDITIONAL (OPHTHALMIC RELATED) ×3 IMPLANT
WATER STERILE IRR 250ML POUR (IV SOLUTION) ×3 IMPLANT

## 2019-10-26 NOTE — Anesthesia Postprocedure Evaluation (Signed)
Anesthesia Post Note  Patient: Lawrence Rogers  Procedure(s) Performed: CATARACT EXTRACTION PHACO AND INTRAOCULAR LENS PLACEMENT LEFT EYE CDE=5.20 (Left Eye)  Patient location during evaluation: Phase II Anesthesia Type: MAC Level of consciousness: awake and alert Pain management: pain level controlled Vital Signs Assessment: post-procedure vital signs reviewed and stable Respiratory status: spontaneous breathing Cardiovascular status: stable Postop Assessment: able to ambulate and adequate PO intake Anesthetic complications: no     Last Vitals:  Vitals:   10/26/19 1107  BP: 95/61  Pulse: 75  Resp: 16  SpO2: 100%    Last Pain:  Vitals:   10/26/19 1107  TempSrc: Oral  PainSc: 0-No pain                 Ousmane Seeman Hristova

## 2019-10-26 NOTE — Transfer of Care (Signed)
Immediate Anesthesia Transfer of Care Note  Patient: Lawrence Rogers  Procedure(s) Performed: CATARACT EXTRACTION PHACO AND INTRAOCULAR LENS PLACEMENT LEFT EYE CDE=5.20 (Left Eye)  Patient Location: PACU  Anesthesia Type:MAC  Level of Consciousness: awake, alert  and oriented  Airway & Oxygen Therapy: Patient Spontanous Breathing  Post-op Assessment: Report given to RN and Post -op Vital signs reviewed and stable  Post vital signs: Reviewed and stable  Last Vitals:  Vitals Value Taken Time  BP    Temp    Pulse    Resp    SpO2      Last Pain:  Vitals:   10/26/19 1107  TempSrc: Oral  PainSc: 0-No pain         Complications: No apparent anesthesia complications

## 2019-10-26 NOTE — Discharge Instructions (Signed)
Please discharge patient when stable, will follow up today with Dr. Marisa Hua at the [facility] office immediately following discharge.  Leave shield in place until visit.  All paperwork with discharge instructions will be given at the office.

## 2019-10-26 NOTE — Interval H&P Note (Signed)
History and Physical Interval Note: The H and P was reviewed and updated. The patient was examined.  No changes were found after exam.  The surgical eye was marked.  10/26/2019 11:37 AM  [name]  has presented today for surgery, with the diagnosis of nuclear cataract left eye.  The various methods of treatment have been discussed with the patient and family. After consideration of risks, benefits and other options for treatment, the patient has consented to  Procedure(s) with comments: CATARACT EXTRACTION PHACO AND INTRAOCULAR LENS PLACEMENT ([place]) (Left) - left as a surgical intervention.  The patient's history has been reviewed, patient examined, no change in status, stable for surgery.  I have reviewed the patient's chart and labs.  Questions were answered to the patient's satisfaction.     Lawrence Rogers

## 2019-10-26 NOTE — Op Note (Signed)
Date of procedure: 10/26/19  Pre-operative diagnosis: Visually significant age-related combined cataract, Left Eye (H25.812)  Post-operative diagnosis: Visually significant age-related combined cataract, Left Eye (H25.812)  Procedure: Removal of cataract via phacoemulsification and insertion of intra-ocular lens [employer] Vision PCB00  +22.0D into the capsular bag of the Left Eye  Attending surgeon: Gerda Diss. Kalyn Hofstra, MD, MA  Anesthesia: MAC, Topical Akten  Complications: None  Estimated Blood Loss: <9m (minimal)  Specimens: None  Implants: As above  Indications:  Visually significant age-related cataract, Left Eye  Procedure:  The patient was seen and identified in the pre-operative area. The operative eye was identified and dilated.  The operative eye was marked.  Topical anesthesia was administered to the operative eye.     The patient was then to the operative suite and placed in the supine position.  A timeout was performed confirming the patient, procedure to be performed, and all other relevant information.   The patient's face was prepped and draped in the usual fashion for intra-ocular surgery.  A lid speculum was placed into the operative eye and the surgical microscope moved into place and focused.  An inferotemporal paracentesis was created using a 20 gauge paracentesis blade.  Shugarcaine was injected into the anterior chamber.  Viscoelastic was injected into the anterior chamber.  A temporal clear-corneal main wound incision was created using a 2.461mmicrokeratome.  A continuous curvilinear capsulorrhexis was initiated using an irrigating cystitome and completed using capsulorrhexis forceps.  Hydrodissection and hydrodeliniation were performed.  Viscoelastic was injected into the anterior chamber.  A phacoemulsification handpiece and a chopper as a second instrument were used to remove the nucleus and epinucleus. The irrigation/aspiration handpiece was used to remove  any remaining cortical material.   The capsular bag was reinflated with viscoelastic, checked, and found to be intact.  The intraocular lens was inserted into the capsular bag.  The irrigation/aspiration handpiece was used to remove any remaining viscoelastic.  The clear corneal wound and paracentesis wounds were then hydrated and checked with Weck-Cels to be watertight.  The lid-speculum and drape was removed, and the patient's face was cleaned with a wet and dry 4x4.  Maxitrol was instilled in the eye before a clear shield was taped over the eye. The patient was taken to the post-operative care unit in good condition, having tolerated the procedure well.  Post-Op Instructions: [facility] and will receive all other orders and instructions.

## 2019-10-26 NOTE — Anesthesia Preprocedure Evaluation (Signed)
Anesthesia Evaluation  Patient identified by MRN, date of birth, ID band Patient awake    Reviewed: Allergy & Precautions, NPO status , Patient's Chart, lab work & pertinent test results  Airway Mallampati: I  TM Distance: >3 FB Neck ROM: Full    Dental no notable dental hx. (+) Edentulous Upper, Edentulous Lower   Pulmonary neg pulmonary ROS, former smoker,    Pulmonary exam normal breath sounds clear to auscultation       Cardiovascular Exercise Tolerance: Good negative cardio ROS Normal cardiovascular examI Rhythm:Regular Rate:Normal     Neuro/Psych Depression negative neurological ROS  negative psych ROS   GI/Hepatic PUD, (+) Cirrhosis   ascites    , cirrhosis  ascites   Endo/Other  negative endocrine ROSdiabetes  Renal/GU negative Renal ROS  negative genitourinary   Musculoskeletal  (+) Arthritis , Osteoarthritis,    Abdominal   Peds negative pediatric ROS (+)  Hematology negative hematology ROS (+)   Anesthesia Other Findings   Reproductive/Obstetrics negative OB ROS                             Anesthesia Physical Anesthesia Plan  ASA: III  Anesthesia Plan: MAC   Post-op Pain Management:    Induction: Intravenous  PONV Risk Score and Plan: 1 and Ondansetron and Treatment may vary due to age or medical condition  Airway Management Planned: Nasal Cannula and Simple Face Mask  Additional Equipment:   Intra-op Plan:   Post-operative Plan:   Informed Consent: I have reviewed the patients History and Physical, chart, labs and discussed the procedure including the risks, benefits and alternatives for the proposed anesthesia with the patient or authorized representative who has indicated his/her understanding and acceptance.     Dental advisory given  Plan Discussed with: CRNA  Anesthesia Plan Comments: (Plan Full PPE use  Plan MAC  -WTP with same after  Q&A  First IOL, reports can lie flat  Ate breakfast at 6am - will wait until noon for MAC)        Anesthesia Quick Evaluation

## 2019-10-27 ENCOUNTER — Encounter (HOSPITAL_COMMUNITY): Payer: Self-pay | Admitting: Ophthalmology

## 2019-11-03 ENCOUNTER — Telehealth (INDEPENDENT_AMBULATORY_CARE_PROVIDER_SITE_OTHER): Payer: Self-pay | Admitting: *Deleted

## 2019-11-03 NOTE — Telephone Encounter (Signed)
Dr.Rehman ask that the patient come by the office today for a weight check, He weighed 231.1 lbs. Dr.Rehman will be made aware and the patient will be called with any further recommendations.

## 2019-11-03 NOTE — H&P (Signed)
Surgical History & Physical  Patient Name: Lawrence Rogers DOB: 1961/12/04  Surgery: Cataract extraction with intraocular lens implant phacoemulsification; Right Eye  Surgeon: Baruch Goldmann MD Surgery Date:  11/09/2019 Pre-Op Date:  11/02/2019  HPI: A 24 Yr. old male patient 1. 1. The patient complains of difficulty when viewing TV, reading closed caption, news scrolls on TV, which began1 year ago. The right eye is affected. The episode is gradual. The condition's severity increased since last visit. Symptoms occur when the patient is inside, outside and reading. This is negatively affecting the patient's quality of life. 2. The patient is returning after cataract post-op. The left eye is affected. Status post cataract post-op, 1 week ago: Since the last visit, the affected area feels improvement. The patient's vision is improved. Patient is following medication instructions. HPI was performed by Baruch Goldmann .  Medical History:  Cataracts Diabetes  Review of Systems Negative Allergic/Immunologic Negative Cardiovascular Negative Constitutional Negative Ear, Nose, Mouth & Throat Negative Endocrine Negative Eyes Negative Gastrointestinal Negative Genitourinary Negative Hemotologic/Lymphatic Negative Integumentary Negative Musculoskeletal Negative Neurological Negative Psychiatry Negative Respiratory  Social   Former smoker   Medication Ketorolac, Vigamox, Ilevro,  Metformin, Spironolactone, Furosemide, Glimeperide, Pantoprazole, Tramadol hydrochloride, Prednisolone acetate,   Sx/Procedures Phaco c IOL,  Fluid on lungs,   Drug Allergies   NKDA  History & Physical: Heent:  Cataract, Right eye NECK: supple without bruits LUNGS: lungs clear to auscultation CV: regular rate and rhythm Abdomen: soft and non-tender  Impression & Plan: Assessment: 1.  COMBINED FORMS AGE RELATED CATARACT; Right Eye (H25.811) 2.  CATARACT EXTRACTION STATUS; Left Eye (Z98.42)  Plan: 1.   Cataract accounts for the patient's decreased vision. This visual impairment is not correctable with a tolerable change in glasses or contact lenses. Cataract surgery with an implantation of a new lens should significantly improve the visual and functional status of the patient. Discussed all risks, benefits, alternatives, and potential complications. Discussed the procedures and recovery. Patient desires to have surgery. A-scan ordered and performed today for intra-ocular lens calculations. The surgery will be performed in order to improve vision for driving, reading, and for eye examinations. Recommend phacoemulsification with intra-ocular lens. Right Eye. Surgery required to correct imbalance of vision. Dilates well - shugarcaine by protocol. 2.  1 week after cataract surgery. Doing well with improved vision and normal eye pressure. Call with any problems or concerns. Stop Vigamox. Continue Ilevro 1 drop 1x/day for 3 more weeks. Continue Pred Acetate 1 drop 2x/day for 3 more weeks.

## 2019-11-05 ENCOUNTER — Other Ambulatory Visit: Payer: Self-pay

## 2019-11-05 ENCOUNTER — Encounter (HOSPITAL_COMMUNITY)
Admission: RE | Admit: 2019-11-05 | Discharge: 2019-11-05 | Disposition: A | Discharge status: Home / Self Care | Payer: BC Managed Care – PPO | Source: Ambulatory Visit | Attending: Ophthalmology | Admitting: Ophthalmology

## 2019-11-05 ENCOUNTER — Telehealth (INDEPENDENT_AMBULATORY_CARE_PROVIDER_SITE_OTHER): Payer: Self-pay | Admitting: *Deleted

## 2019-11-05 NOTE — Telephone Encounter (Signed)
Patient presented to the office 11/04/2019 for a weight check. He weighed 231 LBS 1 OZ. Patient has been maintaining around this weight. Dr.Rehman was made aware.  No changes in his medication, At the time of his procedure he will be weighed and have lab work.  Patient was made ware.

## 2019-11-06 ENCOUNTER — Other Ambulatory Visit (HOSPITAL_COMMUNITY)
Admission: RE | Admit: 2019-11-06 | Discharge: 2019-11-06 | Disposition: A | Discharge status: Home / Self Care | Payer: BC Managed Care – PPO | Source: Ambulatory Visit | Attending: Ophthalmology | Admitting: Ophthalmology

## 2019-11-06 ENCOUNTER — Other Ambulatory Visit: Payer: Self-pay

## 2019-11-06 DIAGNOSIS — Z20828 Contact with and (suspected) exposure to other viral communicable diseases: Secondary | ICD-10-CM | POA: Insufficient documentation

## 2019-11-06 DIAGNOSIS — Z01812 Encounter for preprocedural laboratory examination: Secondary | ICD-10-CM | POA: Diagnosis present

## 2019-11-06 LAB — SARS CORONAVIRUS 2 (TAT 6-24 HRS): SARS Coronavirus 2: NEGATIVE

## 2019-11-09 ENCOUNTER — Ambulatory Visit (HOSPITAL_COMMUNITY)
Admission: RE | Admit: 2019-11-09 | Discharge: 2019-11-09 | Disposition: A | Discharge status: Home / Self Care | Payer: BC Managed Care – PPO | Source: Ambulatory Visit | Attending: Ophthalmology | Admitting: Ophthalmology

## 2019-11-09 ENCOUNTER — Encounter (HOSPITAL_COMMUNITY)
Admission: RE | Disposition: A | Discharge status: Home / Self Care | Payer: Self-pay | Source: Ambulatory Visit | Attending: Ophthalmology

## 2019-11-09 ENCOUNTER — Ambulatory Visit (HOSPITAL_COMMUNITY): Payer: BC Managed Care – PPO | Admitting: Anesthesiology

## 2019-11-09 ENCOUNTER — Other Ambulatory Visit: Payer: Self-pay

## 2019-11-09 ENCOUNTER — Encounter (HOSPITAL_COMMUNITY): Payer: Self-pay | Admitting: *Deleted

## 2019-11-09 DIAGNOSIS — E1136 Type 2 diabetes mellitus with diabetic cataract: Secondary | ICD-10-CM | POA: Diagnosis not present

## 2019-11-09 DIAGNOSIS — H25811 Combined forms of age-related cataract, right eye: Secondary | ICD-10-CM | POA: Insufficient documentation

## 2019-11-09 DIAGNOSIS — R188 Other ascites: Secondary | ICD-10-CM | POA: Diagnosis not present

## 2019-11-09 DIAGNOSIS — K760 Fatty (change of) liver, not elsewhere classified: Secondary | ICD-10-CM | POA: Diagnosis not present

## 2019-11-09 DIAGNOSIS — K279 Peptic ulcer, site unspecified, unspecified as acute or chronic, without hemorrhage or perforation: Secondary | ICD-10-CM | POA: Diagnosis not present

## 2019-11-09 DIAGNOSIS — Z791 Long term (current) use of non-steroidal anti-inflammatories (NSAID): Secondary | ICD-10-CM | POA: Diagnosis not present

## 2019-11-09 DIAGNOSIS — Z87891 Personal history of nicotine dependence: Secondary | ICD-10-CM | POA: Diagnosis not present

## 2019-11-09 DIAGNOSIS — Z7984 Long term (current) use of oral hypoglycemic drugs: Secondary | ICD-10-CM | POA: Insufficient documentation

## 2019-11-09 DIAGNOSIS — Z9842 Cataract extraction status, left eye: Secondary | ICD-10-CM | POA: Diagnosis not present

## 2019-11-09 DIAGNOSIS — Z79899 Other long term (current) drug therapy: Secondary | ICD-10-CM | POA: Diagnosis not present

## 2019-11-09 DIAGNOSIS — F329 Major depressive disorder, single episode, unspecified: Secondary | ICD-10-CM | POA: Diagnosis not present

## 2019-11-09 HISTORY — PX: CATARACT EXTRACTION W/PHACO: SHX586

## 2019-11-09 LAB — GLUCOSE, CAPILLARY: Glucose-Capillary: 108 mg/dL — ABNORMAL HIGH (ref 70–99)

## 2019-11-09 SURGERY — PHACOEMULSIFICATION, CATARACT, WITH IOL INSERTION
Anesthesia: Monitor Anesthesia Care | Site: Eye | Laterality: Right

## 2019-11-09 MED ORDER — LIDOCAINE HCL 3.5 % OP GEL
1.0000 "application " | Freq: Once | OPHTHALMIC | Status: AC
  Administered 2019-11-09: 1 via OPHTHALMIC

## 2019-11-09 MED ORDER — SODIUM HYALURONATE 23 MG/ML IO SOLN
INTRAOCULAR | Status: DC | PRN
  Administered 2019-11-09: 0.6 mL via INTRAOCULAR

## 2019-11-09 MED ORDER — PHENYLEPHRINE HCL 2.5 % OP SOLN
1.0000 [drp] | OPHTHALMIC | Status: AC | PRN
  Administered 2019-11-09 (×3): 1 [drp] via OPHTHALMIC

## 2019-11-09 MED ORDER — LIDOCAINE HCL (PF) 1 % IJ SOLN
INTRAOCULAR | Status: DC | PRN
  Administered 2019-11-09: 1 mL via OPHTHALMIC

## 2019-11-09 MED ORDER — EPINEPHRINE PF 1 MG/ML IJ SOLN
INTRAOCULAR | Status: DC | PRN
  Administered 2019-11-09: 500 mL

## 2019-11-09 MED ORDER — EPINEPHRINE PF 1 MG/ML IJ SOLN
INTRAMUSCULAR | Status: AC
  Filled 2019-11-09: qty 2

## 2019-11-09 MED ORDER — LACTATED RINGERS IV SOLN: INTRAVENOUS | Status: DC

## 2019-11-09 MED ORDER — NEOMYCIN-POLYMYXIN-DEXAMETH 3.5-10000-0.1 OP SUSP
OPHTHALMIC | Status: DC | PRN
  Administered 2019-11-09: 1 [drp] via OPHTHALMIC

## 2019-11-09 MED ORDER — BSS IO SOLN
INTRAOCULAR | Status: DC | PRN
  Administered 2019-11-09: 15 mL

## 2019-11-09 MED ORDER — TETRACAINE HCL 0.5 % OP SOLN
1.0000 [drp] | OPHTHALMIC | Status: AC | PRN
  Administered 2019-11-09 (×3): 1 [drp] via OPHTHALMIC

## 2019-11-09 MED ORDER — CYCLOPENTOLATE-PHENYLEPHRINE 0.2-1 % OP SOLN
1.0000 [drp] | OPHTHALMIC | Status: AC | PRN
  Administered 2019-11-09 (×3): 1 [drp] via OPHTHALMIC

## 2019-11-09 MED ORDER — PROVISC 10 MG/ML IO SOLN
INTRAOCULAR | Status: DC | PRN
  Administered 2019-11-09: 0.85 mL via INTRAOCULAR

## 2019-11-09 MED ORDER — POVIDONE-IODINE 5 % OP SOLN
OPHTHALMIC | Status: DC | PRN
  Administered 2019-11-09: 1 via OPHTHALMIC

## 2019-11-09 SURGICAL SUPPLY — 17 items
CLOTH BEACON ORANGE TIMEOUT ST (SAFETY) ×2 IMPLANT
DEVICE MILOOP (MISCELLANEOUS) IMPLANT
EYE SHIELD UNIVERSAL CLEAR (GAUZE/BANDAGES/DRESSINGS) ×2 IMPLANT
GLOVE BIOGEL PI IND STRL 7.0 (GLOVE) IMPLANT
GLOVE BIOGEL PI INDICATOR 7.0 (GLOVE) ×4
LENS ALC ACRYL/TECN (Ophthalmic Related) ×2 IMPLANT
MILOOP DEVICE (MISCELLANEOUS)
NDL HYPO 18GX1.5 BLUNT FILL (NEEDLE) IMPLANT
NEEDLE HYPO 18GX1.5 BLUNT FILL (NEEDLE) ×3 IMPLANT
PAD ARMBOARD 7.5X6 YLW CONV (MISCELLANEOUS) ×2 IMPLANT
RING MALYGIN (MISCELLANEOUS) IMPLANT
RING MALYGIN 7.0 (MISCELLANEOUS) IMPLANT
SYR TB 1ML LL NO SAFETY (SYRINGE) ×2 IMPLANT
TAPE SURG TRANSPORE 1 IN (GAUZE/BANDAGES/DRESSINGS) IMPLANT
TAPE SURGICAL TRANSPORE 1 IN (GAUZE/BANDAGES/DRESSINGS) ×2
VISCOELASTIC ADDITIONAL (OPHTHALMIC RELATED) ×2 IMPLANT
WATER STERILE IRR 250ML POUR (IV SOLUTION) ×2 IMPLANT

## 2019-11-09 NOTE — Anesthesia Postprocedure Evaluation (Signed)
Anesthesia Post Note  Patient: Lawrence Rogers  Procedure(s) Performed: CATARACT EXTRACTION PHACO AND INTRAOCULAR LENS PLACEMENT ([place]) (Right Eye)  Patient location during evaluation: Short Stay Anesthesia Type: MAC Level of consciousness: awake and alert Pain management: pain level controlled Vital Signs Assessment: post-procedure vital signs reviewed and stable Respiratory status: spontaneous breathing Cardiovascular status: stable Postop Assessment: no apparent nausea or vomiting Anesthetic complications: no     Last Vitals:  Vitals:   11/09/19 0917  BP: (!) 96/56  Resp: 20  Temp: 36.6 C  SpO2: 98%    Last Pain:  Vitals:   11/09/19 0917  PainSc: 0-No pain                 Earnie Rockhold

## 2019-11-09 NOTE — Op Note (Signed)
Date of procedure: 11/09/19  Pre-operative diagnosis:  Visually significant combined form age-related cataract, Right Eye (H25.811)  Post-operative diagnosis:  Visually significant combined form age-related cataract, Right Eye (H25.811)  Procedure: Removal of cataract via phacoemulsification and insertion of intra-ocular lens [employer] Vision PCB00  +21.5D into the capsular bag of the Right Eye  Attending surgeon: Gerda Diss. Teretha Chalupa, MD, MA  Anesthesia: MAC, Topical Akten  Complications: None  Estimated Blood Loss: <70m (minimal)  Specimens: None  Implants: As above  Indications:  Visually significant age-related cataract, Right Eye  Procedure:  The patient was seen and identified in the pre-operative area. The operative eye was identified and dilated.  The operative eye was marked.  Topical anesthesia was administered to the operative eye.     The patient was then to the operative suite and placed in the supine position.  A timeout was performed confirming the patient, procedure to be performed, and all other relevant information.   The patient's face was prepped and draped in the usual fashion for intra-ocular surgery.  A lid speculum was placed into the operative eye and the surgical microscope moved into place and focused.  A superotemporal paracentesis was created using a 20 gauge paracentesis blade.  Shugarcaine was injected into the anterior chamber.  Viscoelastic was injected into the anterior chamber.  A temporal clear-corneal main wound incision was created using a 2.45mmicrokeratome.  A continuous curvilinear capsulorrhexis was initiated using an irrigating cystitome and completed using capsulorrhexis forceps.  Hydrodissection and hydrodeliniation were performed.  Viscoelastic was injected into the anterior chamber.  A phacoemulsification handpiece and a chopper as a second instrument were used to remove the nucleus and epinucleus. The irrigation/aspiration handpiece was  used to remove any remaining cortical material.   The capsular bag was reinflated with viscoelastic, checked, and found to be intact.  The intraocular lens was inserted into the capsular bag.  The irrigation/aspiration handpiece was used to remove any remaining viscoelastic.  The clear corneal wound and paracentesis wounds were then hydrated and checked with Weck-Cels to be watertight.  The lid-speculum and drape was removed, and the patient's face was cleaned with a wet and dry 4x4.  Maxitrol was instilled in the eye before a clear shield was taped over the eye. The patient was taken to the post-operative care unit in good condition, having tolerated the procedure well.  Post-Op Instructions: [facility] and will receive all other orders and instructions.

## 2019-11-09 NOTE — Interval H&P Note (Signed)
History and Physical Interval Note: The H and P was reviewed and updated. The patient was examined.  No changes were found after exam.  The surgical eye was marked.  11/09/2019 9:50 AM  Lawrence Rogers  has presented today for surgery, with the diagnosis of Nuclear sclerotic cataract - Right eye.  The various methods of treatment have been discussed with the patient and family. After consideration of risks, benefits and other options for treatment, the patient has consented to  Procedure(s) with comments: CATARACT EXTRACTION PHACO AND INTRAOCULAR LENS PLACEMENT ([facility]) (Right) - right as a surgical intervention.  The patient's history has been reviewed, patient examined, no change in status, stable for surgery.  I have reviewed the patient's chart and labs.  Questions were answered to the patient's satisfaction.     Baruch Goldmann

## 2019-11-09 NOTE — Anesthesia Preprocedure Evaluation (Signed)
Anesthesia Evaluation  Patient identified by MRN, date of birth, ID band Patient awake    Reviewed: Allergy & Precautions, NPO status , Patient's Chart, lab work & pertinent test results  Airway Mallampati: I       Dental  (+) Edentulous Upper, Edentulous Lower   Pulmonary neg pulmonary ROS, former smoker,  Reports possible OSA , but not Dx'd -snores          Cardiovascular Exercise Tolerance: Good negative cardio ROS  I     Neuro/Psych Depression negative neurological ROS  negative psych ROS   GI/Hepatic Neg liver ROS, PUD, Pt reports fatty liver and ascites issues  Reports last tapped 07/2018 Reports can lie flat without issues    Endo/Other  negative endocrine ROSdiabetes  Renal/GU negative Renal ROS  negative genitourinary   Musculoskeletal negative musculoskeletal ROS (+)   Abdominal   Peds negative pediatric ROS (+)  Hematology negative hematology ROS (+)   Anesthesia Other Findings   Reproductive/Obstetrics negative OB ROS                             Anesthesia Physical Anesthesia Plan  ASA: III  Anesthesia Plan: MAC   Post-op Pain Management:    Induction: Intravenous  PONV Risk Score and Plan: 1 and TIVA  Airway Management Planned: Nasal Cannula and Simple Face Mask  Additional Equipment:   Intra-op Plan:   Post-operative Plan:   Informed Consent: I have reviewed the patients History and Physical, chart, labs and discussed the procedure including the risks, benefits and alternatives for the proposed anesthesia with the patient or authorized representative who has indicated his/her understanding and acceptance.     Dental advisory given  Plan Discussed with: CRNA  Anesthesia Plan Comments: (Plan Full PPE use Plan MAC as with last IOL 10/26/2019 Reports can lie flat, in spite of ascites )        Anesthesia Quick Evaluation

## 2019-11-09 NOTE — Discharge Instructions (Signed)
Please discharge patient when stable, will follow up today with Dr. Marisa Hua at the [facility] office immediately following discharge.  Leave shield in place until visit.  All paperwork with discharge instructions will be given at the office.

## 2019-11-09 NOTE — Transfer of Care (Signed)
Immediate Anesthesia Transfer of Care Note  Patient: JOHNNEY SCARLATA  Procedure(s) Performed: CATARACT EXTRACTION PHACO AND INTRAOCULAR LENS PLACEMENT ([facility]) (Right Eye)  Patient Location: PACU  Anesthesia Type:MAC  Level of Consciousness: awake  Airway & Oxygen Therapy: Patient Spontanous Breathing  Post-op Assessment: Report given to RN  Post vital signs: Reviewed  Last Vitals:  Vitals Value Taken Time  BP    Temp    Pulse    Resp    SpO2      Last Pain:  Vitals:   11/09/19 0917  PainSc: 0-No pain      Patients Stated Pain Goal: 8 (24/82/50 0370)  Complications: No apparent anesthesia complications

## 2019-11-10 ENCOUNTER — Encounter (HOSPITAL_COMMUNITY): Payer: Self-pay | Admitting: Ophthalmology

## 2019-11-26 ENCOUNTER — Telehealth (INDEPENDENT_AMBULATORY_CARE_PROVIDER_SITE_OTHER): Payer: Self-pay | Admitting: Internal Medicine

## 2019-11-26 NOTE — Telephone Encounter (Signed)
Patient left voice mail message stating he would like for Dr Laural Golden to refer him to Dr Brigitte Pulse at [facility] - please advise - ph# [phone]

## 2019-11-30 NOTE — Telephone Encounter (Signed)
Per Dr.Rehman - chart to be reviewed first. Then a recommendation will be made.

## 2019-12-01 ENCOUNTER — Other Ambulatory Visit: Payer: Self-pay

## 2019-12-01 ENCOUNTER — Encounter (HOSPITAL_COMMUNITY): Payer: Self-pay

## 2019-12-02 ENCOUNTER — Other Ambulatory Visit (HOSPITAL_COMMUNITY)
Admission: RE | Admit: 2019-12-02 | Discharge: 2019-12-02 | Disposition: A | Discharge status: Home / Self Care | Payer: BC Managed Care – PPO | Source: Ambulatory Visit | Attending: Internal Medicine | Admitting: Internal Medicine

## 2019-12-02 ENCOUNTER — Encounter (HOSPITAL_COMMUNITY)
Admission: RE | Admit: 2019-12-02 | Discharge: 2019-12-02 | Disposition: A | Discharge status: Home / Self Care | Payer: BC Managed Care – PPO | Source: Ambulatory Visit | Attending: Internal Medicine | Admitting: Internal Medicine

## 2019-12-02 DIAGNOSIS — Z20828 Contact with and (suspected) exposure to other viral communicable diseases: Secondary | ICD-10-CM | POA: Insufficient documentation

## 2019-12-02 DIAGNOSIS — Z01812 Encounter for preprocedural laboratory examination: Secondary | ICD-10-CM | POA: Insufficient documentation

## 2019-12-02 MED ORDER — HYDROMORPHONE HCL 1 MG/ML IJ SOLN: 0.2500 mg | INTRAMUSCULAR | Status: DC | PRN

## 2019-12-02 MED ORDER — HYDROCODONE-ACETAMINOPHEN 7.5-325 MG PO TABS: 1.0000 | ORAL_TABLET | Freq: Once | ORAL | Status: DC | PRN

## 2019-12-02 MED ORDER — MIDAZOLAM HCL 2 MG/2ML IJ SOLN: 0.5000 mg | Freq: Once | INTRAMUSCULAR | Status: DC | PRN

## 2019-12-02 MED ORDER — PROMETHAZINE HCL 25 MG/ML IJ SOLN: 6.2500 mg | INTRAMUSCULAR | Status: DC | PRN

## 2019-12-03 ENCOUNTER — Other Ambulatory Visit (INDEPENDENT_AMBULATORY_CARE_PROVIDER_SITE_OTHER): Payer: Self-pay | Admitting: *Deleted

## 2019-12-03 DIAGNOSIS — K7469 Other cirrhosis of liver: Secondary | ICD-10-CM

## 2019-12-03 DIAGNOSIS — Z1211 Encounter for screening for malignant neoplasm of colon: Secondary | ICD-10-CM

## 2019-12-03 LAB — SARS CORONAVIRUS 2 (TAT 6-24 HRS): SARS Coronavirus 2: NEGATIVE

## 2019-12-03 NOTE — Telephone Encounter (Signed)
Patient has procedure tomorrow 12/04/2019. Dr.Rehman will discuss this with him at that time. Patient has already seen Dr.Shaw at [facility].

## 2019-12-04 ENCOUNTER — Ambulatory Visit (HOSPITAL_COMMUNITY): Payer: BC Managed Care – PPO | Admitting: Anesthesiology

## 2019-12-04 ENCOUNTER — Other Ambulatory Visit: Payer: Self-pay

## 2019-12-04 ENCOUNTER — Encounter (HOSPITAL_COMMUNITY): Payer: Self-pay | Admitting: Internal Medicine

## 2019-12-04 ENCOUNTER — Encounter (HOSPITAL_COMMUNITY)
Admission: RE | Disposition: A | Discharge status: Home / Self Care | Payer: Self-pay | Source: Home / Self Care | Attending: Internal Medicine

## 2019-12-04 ENCOUNTER — Ambulatory Visit (HOSPITAL_COMMUNITY)
Admission: RE | Admit: 2019-12-04 | Discharge: 2019-12-04 | Disposition: A | Discharge status: Home / Self Care | Payer: BC Managed Care – PPO | Attending: Internal Medicine | Admitting: Internal Medicine

## 2019-12-04 DIAGNOSIS — D125 Benign neoplasm of sigmoid colon: Secondary | ICD-10-CM | POA: Insufficient documentation

## 2019-12-04 DIAGNOSIS — Z7984 Long term (current) use of oral hypoglycemic drugs: Secondary | ICD-10-CM | POA: Insufficient documentation

## 2019-12-04 DIAGNOSIS — K3189 Other diseases of stomach and duodenum: Secondary | ICD-10-CM | POA: Diagnosis not present

## 2019-12-04 DIAGNOSIS — D123 Benign neoplasm of transverse colon: Secondary | ICD-10-CM | POA: Diagnosis not present

## 2019-12-04 DIAGNOSIS — I8510 Secondary esophageal varices without bleeding: Secondary | ICD-10-CM | POA: Diagnosis not present

## 2019-12-04 DIAGNOSIS — D124 Benign neoplasm of descending colon: Secondary | ICD-10-CM | POA: Insufficient documentation

## 2019-12-04 DIAGNOSIS — K766 Portal hypertension: Secondary | ICD-10-CM | POA: Diagnosis not present

## 2019-12-04 DIAGNOSIS — D120 Benign neoplasm of cecum: Secondary | ICD-10-CM | POA: Insufficient documentation

## 2019-12-04 DIAGNOSIS — I8500 Esophageal varices without bleeding: Secondary | ICD-10-CM | POA: Diagnosis not present

## 2019-12-04 DIAGNOSIS — K7469 Other cirrhosis of liver: Secondary | ICD-10-CM

## 2019-12-04 DIAGNOSIS — Z1211 Encounter for screening for malignant neoplasm of colon: Secondary | ICD-10-CM

## 2019-12-04 DIAGNOSIS — E119 Type 2 diabetes mellitus without complications: Secondary | ICD-10-CM | POA: Diagnosis not present

## 2019-12-04 DIAGNOSIS — Z87891 Personal history of nicotine dependence: Secondary | ICD-10-CM | POA: Diagnosis not present

## 2019-12-04 DIAGNOSIS — K228 Other specified diseases of esophagus: Secondary | ICD-10-CM | POA: Insufficient documentation

## 2019-12-04 DIAGNOSIS — R188 Other ascites: Secondary | ICD-10-CM | POA: Diagnosis not present

## 2019-12-04 DIAGNOSIS — D500 Iron deficiency anemia secondary to blood loss (chronic): Secondary | ICD-10-CM | POA: Insufficient documentation

## 2019-12-04 DIAGNOSIS — Z8711 Personal history of peptic ulcer disease: Secondary | ICD-10-CM | POA: Diagnosis not present

## 2019-12-04 DIAGNOSIS — K7460 Unspecified cirrhosis of liver: Secondary | ICD-10-CM | POA: Insufficient documentation

## 2019-12-04 DIAGNOSIS — K7581 Nonalcoholic steatohepatitis (NASH): Secondary | ICD-10-CM | POA: Insufficient documentation

## 2019-12-04 DIAGNOSIS — Z79899 Other long term (current) drug therapy: Secondary | ICD-10-CM | POA: Diagnosis not present

## 2019-12-04 DIAGNOSIS — M1990 Unspecified osteoarthritis, unspecified site: Secondary | ICD-10-CM | POA: Insufficient documentation

## 2019-12-04 HISTORY — PX: POLYPECTOMY: SHX5525

## 2019-12-04 HISTORY — PX: COLONOSCOPY WITH PROPOFOL: SHX5780

## 2019-12-04 HISTORY — PX: ESOPHAGEAL BANDING: SHX5518

## 2019-12-04 HISTORY — PX: ESOPHAGOGASTRODUODENOSCOPY (EGD) WITH PROPOFOL: SHX5813

## 2019-12-04 LAB — CBC
HCT: 34.9 % — ABNORMAL LOW (ref 39.0–52.0)
Hemoglobin: 10.4 g/dL — ABNORMAL LOW (ref 13.0–17.0)
MCH: 24.6 pg — ABNORMAL LOW (ref 26.0–34.0)
MCHC: 29.8 g/dL — ABNORMAL LOW (ref 30.0–36.0)
MCV: 82.7 fL (ref 80.0–100.0)
Platelets: 102 10*3/uL — ABNORMAL LOW (ref 150–400)
RBC: 4.22 MIL/uL (ref 4.22–5.81)
RDW: 17.5 % — ABNORMAL HIGH (ref 11.5–15.5)
WBC: 4.1 10*3/uL (ref 4.0–10.5)
nRBC: 0 % (ref 0.0–0.2)

## 2019-12-04 LAB — GLUCOSE, CAPILLARY
Glucose-Capillary: 110 mg/dL — ABNORMAL HIGH (ref 70–99)
Glucose-Capillary: 115 mg/dL — ABNORMAL HIGH (ref 70–99)

## 2019-12-04 SURGERY — ESOPHAGOGASTRODUODENOSCOPY (EGD) WITH PROPOFOL
Anesthesia: General

## 2019-12-04 MED ORDER — PROMETHAZINE HCL 25 MG/ML IJ SOLN: 6.2500 mg | INTRAMUSCULAR | Status: DC | PRN

## 2019-12-04 MED ORDER — SODIUM CHLORIDE 0.9 % IV SOLN
INTRAVENOUS | Status: DC | PRN
  Administered 2019-12-04 (×7): 100 ug via INTRAVENOUS

## 2019-12-04 MED ORDER — PROPOFOL 10 MG/ML IV BOLUS
INTRAVENOUS | Status: AC
  Filled 2019-12-04: qty 40

## 2019-12-04 MED ORDER — HYDROMORPHONE HCL 1 MG/ML IJ SOLN: 0.2500 mg | INTRAMUSCULAR | Status: DC | PRN

## 2019-12-04 MED ORDER — KETAMINE HCL 10 MG/ML IJ SOLN
INTRAMUSCULAR | Status: DC | PRN
  Administered 2019-12-04: 30 mg via INTRAVENOUS

## 2019-12-04 MED ORDER — HYDROCODONE-ACETAMINOPHEN 7.5-325 MG PO TABS: 1.0000 | ORAL_TABLET | Freq: Once | ORAL | Status: DC | PRN

## 2019-12-04 MED ORDER — KETAMINE HCL 50 MG/5ML IJ SOSY
PREFILLED_SYRINGE | INTRAMUSCULAR | Status: AC
  Filled 2019-12-04: qty 5

## 2019-12-04 MED ORDER — PROPOFOL 500 MG/50ML IV EMUL
INTRAVENOUS | Status: DC | PRN
  Administered 2019-12-04: 150 ug/kg/min via INTRAVENOUS

## 2019-12-04 MED ORDER — LACTATED RINGERS IV SOLN: INTRAVENOUS | Status: DC

## 2019-12-04 MED ORDER — CHLORHEXIDINE GLUCONATE CLOTH 2 % EX PADS: 6.0000 | MEDICATED_PAD | Freq: Once | CUTANEOUS | Status: DC

## 2019-12-04 MED ORDER — PHENYLEPHRINE 40 MCG/ML (10ML) SYRINGE FOR IV PUSH (FOR BLOOD PRESSURE SUPPORT)
PREFILLED_SYRINGE | INTRAVENOUS | Status: AC
  Filled 2019-12-04: qty 10

## 2019-12-04 MED ORDER — STERILE WATER FOR IRRIGATION IR SOLN
Status: DC | PRN
  Administered 2019-12-04: 1.5 mL

## 2019-12-04 MED ORDER — MIDAZOLAM HCL 2 MG/2ML IJ SOLN: 0.5000 mg | Freq: Once | INTRAMUSCULAR | Status: DC | PRN

## 2019-12-04 NOTE — Transfer of Care (Signed)
Immediate Anesthesia Transfer of Care Note  Patient: Lawrence Rogers  Procedure(s) Performed: ESOPHAGOGASTRODUODENOSCOPY (EGD) WITH PROPOFOL (N/A ) COLONOSCOPY WITH PROPOFOL (N/A ) ESOPHAGEAL BANDING POLYPECTOMY  Patient Location: PACU  Anesthesia Type:MAC  Level of Consciousness: awake, alert , oriented and patient cooperative  Airway & Oxygen Therapy: Patient Spontanous Breathing  Post-op Assessment: Report given to RN, Post -op Vital signs reviewed and stable and Patient moving all extremities X 4  Post vital signs: Reviewed and stable  Last Vitals:  Vitals Value Taken Time  BP 95/49 12/04/19 1200  Temp    Pulse 64 12/04/19 1202  Resp 19 12/04/19 1202  SpO2 94 % 12/04/19 1202  Vitals shown include unvalidated device data.  Last Pain:  Vitals:   12/04/19 1040  TempSrc:   PainSc: 0-No pain         Complications: No apparent anesthesia complications

## 2019-12-04 NOTE — H&P (Signed)
Lawrence Rogers is an 58 y.o. male.   Chief Complaint: Patient is here for esophagogastroduodenoscopy with possible esophageal variceal banding and colonoscopy. HPI: Patient 58 year old Caucasian male who has history of cirrhosis secondary to NASH complicated by esophageal varices and ascites.  He last underwent EGD with banding in November 2019.  Supposed to come back within few months but was not able to do so because of Covid-19 pandemic.  Is also seen at [facility] for transplant evaluation but his meld score is low and he was advised to come back in a year.  His visit should have been in October 2020 but he has an appointment for next year in March.  He was seen in the office in September and again few weeks ago.  His hemoglobin was noted to be low.  Stool was heme positive.  Iron studies consistent with iron deficiency anemia.  He denies melena or rectal bleeding nausea vomiting or hematemesis.  He remains with good appetite.  He denies dysphagia.  Past Medical History:  Diagnosis Date  . Arthritis   . Cirrhosis ([place])   . Depression   . Diabetes [facility])     Past Surgical History:  Procedure Laterality Date  . CATARACT EXTRACTION W/PHACO Left 10/26/2019   Procedure: CATARACT EXTRACTION PHACO AND INTRAOCULAR LENS PLACEMENT LEFT EYE CDE=5.20;  Surgeon: Baruch Goldmann, MD;  Location: [facility];  Service: Ophthalmology;  Laterality: Left;  left  . CATARACT EXTRACTION W/PHACO Right 11/09/2019   Procedure: CATARACT EXTRACTION PHACO AND INTRAOCULAR LENS PLACEMENT (IOC);  Surgeon: Baruch Goldmann, MD;  Location: [facility];  Service: Ophthalmology;  Laterality: Right;  CDE: 3.03  . ESOPHAGEAL BANDING N/A 08/29/2018   Procedure: ESOPHAGEAL BANDING;  Surgeon: Rogene Houston, MD;  Location: [facility];  Service: Endoscopy;  Laterality: N/A;  . ESOPHAGEAL BANDING  10/24/2018   Procedure: ESOPHAGEAL BANDING;  Surgeon: Rogene Houston, MD;  [facility];  Service: Endoscopy;;  3 bands  .  ESOPHAGOGASTRODUODENOSCOPY (EGD) WITH PROPOFOL N/A 08/29/2018   Procedure: ESOPHAGOGASTRODUODENOSCOPY (EGD) WITH PROPOFOL;  Surgeon: Rogene Houston, MD;  [facility];  Service: Endoscopy;  Laterality: N/A;  9:30  . ESOPHAGOGASTRODUODENOSCOPY (EGD) WITH PROPOFOL N/A 10/24/2018   Procedure: ESOPHAGOGASTRODUODENOSCOPY (EGD) WITH PROPOFOL;  Surgeon: Rogene Houston, MD;  [facility];  Service: Endoscopy;  Laterality: N/A;  7:30  . IR PARACENTESIS  04/18/2018  . IR TRANSCATHETER BX  04/18/2018  . IR US GUIDE VASC ACCESS RIGHT  04/18/2018  . IR VENOGRAM HEPATIC W HEMODYNAMIC EVALUATION  04/18/2018  . PLEURAL EFFUSION DRAINAGE Right 07/22/2019   Procedure: Drainage Of Pleural Effusion;  Surgeon: Lajuana Matte, MD;  Location: [place];  Service: Thoracic;  Laterality: Right;  Marland Kitchen VIDEO ASSISTED THORACOSCOPY (VATS)/DECORTICATION Right 07/22/2019   Procedure: VIDEO ASSISTED THORACOSCOPY (VATS)/DECORTICATION;  Surgeon: Lajuana Matte, MD;  Location: [place];  Service: Thoracic;  Laterality: Right;    History reviewed. No pertinent family history. Social History:  reports that he quit smoking about 25 years ago. His smoking use included cigarettes. He has a 3.75 pack-year smoking history. He has never used smokeless tobacco. He reports that he does not drink alcohol or use drugs.  Allergies: No Known Allergies  Medications Prior to Admission  Medication Sig Dispense Refill  . ferrous sulfate 325 (65 FE) MG tablet Take 1 tablet (325 mg total) by mouth daily with breakfast.    . furosemide (LASIX) 40 MG tablet Take 80 mg by mouth  daily.     . glimepiride (AMARYL) 2 MG tablet Take 2 mg by mouth daily.    . metFORMIN (GLUCOPHAGE-XR) 500 MG 24 hr tablet Take 1,000 mg by mouth 2 (two) times daily.  2  . pantoprazole (PROTONIX) 40 MG tablet TAKE ONE TABLET (40MG TOTAL) BY MOUTH DAILY BEFORE BREAKFAST (Patient taking differently: Take 80 mg by mouth daily. ) 30 tablet 5  . spironolactone  (ALDACTONE) 100 MG tablet Take 2 tablets (200 mg total) by mouth daily.    . traMADol (ULTRAM) 50 MG tablet Take 50 mg by mouth every 6 (six) hours as needed (back pain.).     Marland Kitchen ketorolac (ACULAR) 0.5 % ophthalmic solution Place 1 drop into both eyes as directed.     . moxifloxacin (VIGAMOX) 0.5 % ophthalmic solution Apply 1 drop to eye as directed.     . prednisoLONE acetate (PRED FORTE) 1 % ophthalmic suspension Place 1 drop into both eyes as directed.       Results for orders placed or performed during the hospital encounter of 12/04/19 (from the past 48 hour(s))  Glucose, capillary     Status: Abnormal   Collection Time: 12/04/19 10:09 AM  Result Value Ref Range   Glucose-Capillary 110 (H) 70 - 99 mg/dL   No results found.  Review of Systems  Blood pressure 106/66, pulse 74, temperature 98.1 F (36.7 C), temperature source Oral, resp. rate (!) 26, SpO2 99 %. Physical Exam  Constitutional: He appears well-developed and well-nourished.  HENT:  Mouth/Throat: Oropharynx is clear and moist.  Eyes: Conjunctivae are normal. No scleral icterus.  Neck: No thyromegaly present.  Cardiovascular: Normal rate, regular rhythm and normal heart sounds.  No murmur heard. Respiratory: Effort normal and breath sounds normal.  GI:  Abdomen is full.  Small umbilical hernia noted.  No organomegaly or masses.  Musculoskeletal:     Comments: 1+ pitting edema involving both legs right greater than left.  Lymphadenopathy:    He has no cervical adenopathy.  Neurological: He is alert.  Skin: Skin is warm and dry.     Assessment/Plan cirrhosis secondary to NASH placated by esophageal varices which have been banded before. Heme positive stool and iron deficiency anemia. Esophagogastroduodenoscopy with possible esophageal variceal banding and colonoscopy.   Hildred Laser, MD 12/04/2019, 10:42 AM

## 2019-12-04 NOTE — Discharge Instructions (Signed)
No aspirin or NSAIDs. Resume other medications as before except Metformin which can be started tomorrow morning. Full liquids today. Resume usual diet starting on 12/05/2019 No driving for 24 hours. Patient will call with biopsy results. Remember you cannot have an MRI until clips have passed.      Upper Endoscopy, Adult, Care After This sheet gives you information about how to care for yourself after your procedure. Your health care provider may also give you more specific instructions. If you have problems or questions, contact your health care provider. What can I expect after the procedure? After the procedure, it is common to have:  A sore throat.  Mild stomach pain or discomfort.  Bloating.  Nausea. Follow these instructions at home:   Follow instructions from your health care provider about what to eat or drink after your procedure.  Return to your normal activities as told by your health care provider. Ask your health care provider what activities are safe for you.  Take over-the-counter and prescription medicines only as told by your health care provider.  Do not drive for 24 hours if you were given a sedative during your procedure.  Keep all follow-up visits as told by your health care provider. This is important. Contact a health care provider if you have:  A sore throat that lasts longer than one day.  Trouble swallowing. Get help right away if:  You vomit blood or your vomit looks like coffee grounds.  You have: ? A fever. ? Bloody, black, or tarry stools. ? A severe sore throat or you cannot swallow. ? Difficulty breathing. ? Severe pain in your chest or abdomen. Summary  After the procedure, it is common to have a sore throat, mild stomach discomfort, bloating, and nausea.  Do not drive for 24 hours if you were given a sedative during the procedure.  Follow instructions from your health care provider about what to eat or drink after your  procedure.  Return to your normal activities as told by your health care provider. This information is not intended to replace advice given to you by your health care provider. Make sure you discuss any questions you have with your health care provider. Document Released: 06/03/2012 Document Revised: 05/27/2018 Document Reviewed: 05/05/2018 Elsevier Patient Education  2020 [employer].     Colonoscopy, Adult, Care After This sheet gives you information about how to care for yourself after your procedure. Your doctor may also give you more specific instructions. If you have problems or questions, call your doctor. What can I expect after the procedure? After the procedure, it is common to have:  A small amount of blood in your poop for 24 hours.  Some gas.  Mild cramping or bloating in your belly. Follow these instructions at home: General instructions  For the first 24 hours after the procedure: ? Do not drive or use machinery. ? Do not sign important documents. ? Do not drink alcohol. ? Do your daily activities more slowly than normal. ? Eat foods that are soft and easy to digest.  Take over-the-counter or prescription medicines only as told by your doctor. To help cramping and bloating:   Try walking around.  Put heat on your belly (abdomen) as told by your doctor. Use a heat source that your doctor recommends, such as a moist heat pack or a heating pad. ? Put a towel between your skin and the heat source. ? Leave the heat on for 20-30 minutes. ? Remove the heat  if your skin turns bright red. This is especially important if you cannot feel pain, heat, or cold. You can get burned. Eating and drinking   Drink enough fluid to keep your pee (urine) clear or pale yellow.  Return to your normal diet as told by your doctor. Avoid heavy or fried foods that are hard to digest.  Avoid drinking alcohol for as long as told by your doctor. Contact a doctor if:  You have blood  in your poop (stool) 2-3 days after the procedure. Get help right away if:  You have more than a small amount of blood in your poop.  You see large clumps of tissue (blood clots) in your poop.  Your belly is swollen.  You feel sick to your stomach (nauseous).  You throw up (vomit).  You have a fever.  You have belly pain that gets worse, and medicine does not help your pain. Summary  After the procedure, it is common to have a small amount of blood in your poop. You may also have mild cramping and bloating in your belly.  For the first 24 hours after the procedure, do not drive or use machinery, do not sign important documents, and do not drink alcohol.  Get help right away if you have a lot of blood in your poop, feel sick to your stomach, have a fever, or have more belly pain. This information is not intended to replace advice given to you by your health care provider. Make sure you discuss any questions you have with your health care provider. Document Released: 01/05/2011 Document Revised: 10/03/2017 Document Reviewed: 08/27/2016 Elsevier Patient Education  2020 [place].    Full Liquid Diet A full liquid diet refers to fluids and foods that are liquid or will become liquid at room temperature. This diet should only be used for a short period of time to help you recover from illness or surgery. Your health care provider or dietitian will help you determine when it is safe to eat regular foods. What are tips for following this plan?     Reading food labels  Check food labels of nutrition shakes for the amount of protein. Look for nutrition shakes that have at least 8-10 grams of protein in each serving.  Look for drinks, such as milks and juices, that are "fortified" or "enriched." This means that vitamins and minerals have been added. Shopping  Buy premade nutritional shakes to keep on hand.  To vary your choices, buy different flavors of milks and shakes. Meal  planning  Choose flavors and foods that you enjoy.  To make sure you get enough energy from food (calories): ? Eat 3 full liquid meals each day. Have a liquid snack between each meal. ? Drink 6-8 ounces (177-237 ml) of a nutrition supplement shake with meals or as snacks. ? Add protein powder, powdered milk, milk, or yogurt to shakes to increase the amount of protein.  Drink at least one serving a day of citrus fruit juice or fruit juice that has vitamin C added. General guidelines  Before starting the full-liquid diet, check with your health care provider to know what foods you should avoid. These may include full-fat or high-fiber liquids.  You may have any liquid or food that becomes a liquid at room temperature. The food is considered a liquid if it can be poured off a spoon at room temperature.  Do not drink alcohol unless approved by your health care provider.  This diet gives  you most of the nutrients that you need for energy, but you may not get enough of certain vitamins, minerals, and fiber. Make sure to talk to your health care provider or dietitian about: ? How many calories you need to eat get day. ? How much fluid you should have each day. ? Taking a multivitamin or a nutritional supplement. What foods are allowed? The items listed may not be a complete list. Talk with your dietitian about what dietary choices are best for you. Grains Thin hot cereal, such as cream of wheat. Soft-cooked pasta or rice pured in soup. Vegetables Pulp-free tomato or vegetable juice. Vegetables pured in soup. Fruits Fruit juice without pulp. Strained fruit pures (seeds and skins removed). Meats and other protein foods Beef, chicken, and fish broths. Powdered protein supplements. Dairy Milk and milk-based beverages, including milk shakes and instant breakfast mixes. Smooth yogurt. Pured cottage cheese. Beverages Water. Coffee and tea (caffeinated or decaffeinated). Cocoa. Liquid  nutritional supplements. Soft drinks. Nondairy milks, such as almond, coconut, rice, or soy milk. Fats and oils Melted margarine and butter. Cream. [place], almond, avocado, corn, grapeseed, sunflower, and sesame oils. Gravy. Sweets and desserts Custard. Pudding. Flavored gelatin. Smooth ice cream (without nuts or candy pieces). Sherbet. Popsicles. [place] ice. Pudding pops. Seasoning and other foods Salt and pepper. Spices. Cocoa powder. Vinegar. Ketchup. Yellow mustard. Smooth sauces, such as Hollandaise, cheese sauce, or white sauce. Soy sauce. Cream soups. Strained soups. Syrup. Honey. Jelly (without fruit pieces). What foods are not allowed? The items listed may not be a complete list. Talk with your dietitian about what dietary choices are best for you. Grains Whole grains. Pasta. Rice. Cold cereal. Bread. Crackers. Vegetables All whole fresh, frozen, or canned vegetables. Fruits All whole fresh, frozen, or canned fruits. Meats and other protein foods All cuts of meat, poultry, and fish. Eggs. Tofu and soy protein. Nuts and nut butters. Lunch meat. Sausage. Dairy Hard cheese. Yogurt with fruit chunks. Fats and oils Coconut oil. Palm oil. Lard. Cold butter. Sweets and desserts Ice cream or other frozen desserts that have any solids in them or on top, such as nuts, chocolate chips, and pieces of cookies. Cakes. Cookies. Candy. Seasoning and other foods Stone-ground mustards. Soups with chunks or pieces. Summary  A full liquid diet refers to fluids and foods that are liquid or will become liquid at room temperature.  This diet should only be used for a short period of time to help you recover from illness or surgery. Ask your health care provider or dietitian when it is safe for you to eat regular foods.  To make sure you get enough calories and nutrients, eat 3 meals each day with snacks between. Drink premade nutrition supplement shakes or add protein powder to homemade shakes.  Take a vitamin and mineral supplement as told by your health care provider. This information is not intended to replace advice given to you by your health care provider. Make sure you discuss any questions you have with your health care provider. Document Released: 12/03/2005 Document Revised: 03/01/2018 Document Reviewed: 01/16/2017 Elsevier Patient Education  2020 [place] After These instructions provide you with information about caring for yourself after your procedure. Your health care provider may also give you more specific instructions. Your treatment has been planned according to current medical practices, but problems sometimes occur. Call your health care provider if you have any problems or questions after your procedure. What can I  expect after the procedure? After your procedure, you may:  Feel sleepy for several hours.  Feel clumsy and have poor balance for several hours.  Feel forgetful about what happened after the procedure.  Have poor judgment for several hours.  Feel nauseous or vomit.  Have a sore throat if you had a breathing tube during the procedure. Follow these instructions at home: For at least 24 hours after the procedure:      Have a responsible adult stay with you. It is important to have someone help care for you until you are awake and alert.  Rest as needed.  Do not: ? Participate in activities in which you could fall or become injured. ? Drive. ? Use heavy machinery. ? Drink alcohol. ? Take sleeping pills or medicines that cause drowsiness. ? Make important decisions or sign legal documents. ? Take care of children on your own. Eating and drinking  Follow the diet that is recommended by your health care provider.  If you vomit, drink water, juice, or soup when you can drink without vomiting.  Make sure you have little or no nausea before eating solid foods. General instructions  Take  over-the-counter and prescription medicines only as told by your health care provider.  If you have sleep apnea, surgery and certain medicines can increase your risk for breathing problems. Follow instructions from your health care provider about wearing your sleep device: ? Anytime you are sleeping, including during daytime naps. ? While taking prescription pain medicines, sleeping medicines, or medicines that make you drowsy.  If you smoke, do not smoke without supervision.  Keep all follow-up visits as told by your health care provider. This is important. Contact a health care provider if:  You keep feeling nauseous or you keep vomiting.  You feel light-headed.  You develop a rash.  You have a fever. Get help right away if:  You have trouble breathing. Summary  For several hours after your procedure, you may feel sleepy and have poor judgment.  Have a responsible adult stay with you for at least 24 hours or until you are awake and alert. This information is not intended to replace advice given to you by your health care provider. Make sure you discuss any questions you have with your health care provider. Document Released: 03/25/2016 Document Revised: 03/03/2018 Document Reviewed: 03/25/2016 Elsevier Patient Education  2020 [employer].

## 2019-12-04 NOTE — Op Note (Signed)
[facility] Patient Name: Lawrence Rogers Procedure Date: 12/04/2019 11:09 AM MRN: [id number] Date of Birth: 12-18-1960 Attending MD: Hildred Laser , MD CSN: [id number] Age: 58 Admit Type: Outpatient Procedure:                Colonoscopy Indications:              Iron deficiency anemia secondary to chronic blood                            loss Providers:                Hildred Laser, MD, Charlsie Quest. Theda Sers RN, RN,                            Raphael Gibney, Technician Referring MD:              Medicines:                Propofol per Anesthesia Complications:            No immediate complications. Estimated Blood Loss:     Estimated blood loss was minimal. Procedure:                Pre-Anesthesia Assessment:                           - Prior to the procedure, a History and Physical                            was performed, and patient medications and                            allergies were reviewed. The patient's tolerance of                            previous anesthesia was also reviewed. The risks                            and benefits of the procedure and the sedation                            options and risks were discussed with the patient.                            All questions were answered, and informed consent                            was obtained. Prior Anticoagulants: The patient has                            taken no previous anticoagulant or antiplatelet                            agents. ASA Grade Assessment: III - A patient with  severe systemic disease. After reviewing the risks                            and benefits, the patient was deemed in                            satisfactory condition to undergo the procedure.                           After obtaining informed consent, the colonoscope                            was passed under direct vision. Throughout the                            procedure, the patient's blood  pressure, pulse, and                            oxygen saturations were monitored continuously. The                            [id number]-H190DL ([id number]) scope was introduced through                            the anus and advanced to the the cecum, identified                            by appendiceal orifice and ileocecal valve. The                            colonoscopy was performed without difficulty. The                            patient tolerated the procedure well. The quality                            of the bowel preparation was good except the                            ascending colon was unsatisfactory. Scope In: 11:11:25 AM Scope Out: 11:51:26 AM Scope Withdrawal Time: 0 hours 30 minutes 15 seconds  Total Procedure Duration: 0 hours 40 minutes 1 second  Findings:      The perianal and digital rectal examinations were normal.      Six polyps were found in the descending colon, splenic flexure,       transverse colon, hepatic flexure and cecum. The polyps were 5 to 7 mm       in size. These polyps were removed with a cold snare. Resection and       retrieval were complete. To stop active bleeding, five hemostatic clips       were successfully placed (MR conditional). There was no bleeding at the       end of the procedure.      Two 7 to 12 mm polyp was found in the mid sigmoid colon. The  polyp was       semi-pedunculated. The polyps were removed with a hot snare. Resection       and retrieval were complete. To prevent bleeding after the polypectomy,       one hemostatic clip was successfully placed (MR conditional). There was       no bleeding during, or at the end, of the procedure. The pathology       specimen was placed into Bottle Number 2.      The retroflexed view of the distal rectum and anal verge was normal and       showed no anal or rectal abnormalities. Impression:               - Five 5 to 7 mm polyps in the descending colon, at                            the splenic  flexure, in the transverse colon, at                            the hepatic flexure and in the cecum, removed with                            a cold snare. Resected and retrieved. Clips (MR                            conditional) were placed.                           - Two 7 to12 mm polyp in the mid sigmoid colon,                            removed with a hot snare. Resected and retrieved.                            Clip (MR conditional) was placed. Moderate Sedation:      Per Anesthesia Care Recommendation:           - Patient has a contact number available for                            emergencies. The signs and symptoms of potential                            delayed complications were discussed with the                            patient. Return to normal activities tomorrow.                            Written discharge instructions were provided to the                            patient.                           - Full liquid diet  today.                           - Continue present medications.                           - No aspirin, ibuprofen, naproxen, or other                            non-steroidal anti-inflammatory drugs.                           - Await pathology results.                           - Repeat colonoscopy is recommended. The                            colonoscopy date will be determined after pathology                            results from today's exam become available for                            review. Procedure Code(s):        --- Professional ---                           [phone], Colonoscopy, flexible; with removal of                            tumor(s), polyp(s), or other lesion(s) by snare                            technique Diagnosis Code(s):        --- Professional ---                           K63.5, Polyp of colon                           D50.0, Iron deficiency anemia secondary to blood                            loss (chronic) CPT  copyright 2019 American Medical Association. All rights reserved. The codes documented in this report are preliminary and upon coder review may  be revised to meet current compliance requirements. Hildred Laser, MD Hildred Laser, MD 12/04/2019 12:13:58 PM This report has been signed electronically. Number of Addenda: 0

## 2019-12-04 NOTE — Op Note (Signed)
[facility] Patient Name: Lawrence Rogers Procedure Date: 12/04/2019 10:29 AM MRN: [id number] Date of Birth: 1961/04/27 Attending MD: Hildred Laser , MD CSN: [id number] Age: 58 Admit Type: Outpatient Procedure:                Upper GI endoscopy Indications:              Iron deficiency anemia secondary to chronic blood                            loss, Esophageal varices, Follow-up of esophageal                            varices, For therapy of esophageal varices Providers:                Hildred Laser, MD, Charlsie Quest. Theda Sers RN, RN,                            Raphael Gibney, Technician Referring MD:              Medicines:                Propofol per Anesthesia Complications:            No immediate complications. Estimated Blood Loss:     Estimated blood loss: none. Procedure:                Pre-Anesthesia Assessment:                           - Prior to the procedure, a History and Physical                            was performed, and patient medications and                            allergies were reviewed. The patient's tolerance of                            previous anesthesia was also reviewed. The risks                            and benefits of the procedure and the sedation                            options and risks were discussed with the patient.                            All questions were answered, and informed consent                            was obtained. Prior Anticoagulants: The patient has                            taken no previous anticoagulant or antiplatelet  agents. ASA Grade Assessment: III - A patient with                            severe systemic disease. After reviewing the risks                            and benefits, the patient was deemed in                            satisfactory condition to undergo the procedure.                           After obtaining informed consent, the endoscope was        passed under direct vision. Throughout the                            procedure, the patient's blood pressure, pulse, and                            oxygen saturations were monitored continuously. The                            GIF-H190 ([id number]) was introduced through the                            mouth, and advanced to the second part of duodenum.                            The upper GI endoscopy was accomplished without                            difficulty. The patient tolerated the procedure                            well. Scope In: 10:48:29 AM Scope Out: 11:06:01 AM Total Procedure Duration: 0 hours 17 minutes 32 seconds  Findings:      Grade III varices were found in the middle third of the esophagus and in       the lower third of the esophagus. Five bands were successfully placed       with incomplete eradication of varices. There was no bleeding during and       at the end of the procedure.      The Z-line was irregular and was found 45 cm from the incisors.      Severe portal hypertensive gastropathy was found in the entire examined       stomach.      The exam of the stomach was otherwise normal.      The duodenal bulb and second portion of the duodenum were normal. Impression:               - Grade III esophageal varices. Incompletely                            eradicated. Banded.                           -  Z-line irregular, 45 cm from the incisors.                           - Portal hypertensive gastropathy. nodular mucosa                            at antrum with erosions.                           - Normal duodenal bulb and second portion of the                            duodenum.                           - No specimens collected. Moderate Sedation:      Per Anesthesia Care Recommendation:           - Patient has a contact number available for                            emergencies. The signs and symptoms of potential                            delayed  complications were discussed with the                            patient. Return to normal activities tomorrow.                            Written discharge instructions were provided to the                            patient.                           - Resume previous diet today.                           - Continue present medications.                           - Repeat upper endoscopy in 3 months.                           - See other procedure. Procedure Code(s):        --- Professional ---                           [phone], Esophagogastroduodenoscopy, flexible,                            transoral; with band ligation of esophageal/gastric                            varices Diagnosis Code(s):        --- Professional ---  I85.00, Esophageal varices without bleeding                           K22.8, Other specified diseases of esophagus                           K76.6, Portal hypertension                           K31.89, Other diseases of stomach and duodenum                           D50.0, Iron deficiency anemia secondary to blood                            loss (chronic) CPT copyright 2019 American Medical Association. All rights reserved. The codes documented in this report are preliminary and upon coder review may  be revised to meet current compliance requirements. Hildred Laser, MD Hildred Laser, MD 12/04/2019 12:06:48 PM This report has been signed electronically. Number of Addenda: 0

## 2019-12-04 NOTE — Anesthesia Preprocedure Evaluation (Signed)
Anesthesia Evaluation  Patient identified by MRN, date of birth, ID band Patient awake    Reviewed: Allergy & Precautions, NPO status , Patient's Chart, lab work & pertinent test results  Airway Mallampati: I  TM Distance: >3 FB Neck ROM: Full    Dental no notable dental hx. (+) Edentulous Upper, Edentulous Lower   Pulmonary neg pulmonary ROS, former smoker,  H/o Empyema in 07/2019     Pulmonary exam normal breath sounds clear to auscultation       Cardiovascular Exercise Tolerance: Good negative cardio ROS Normal cardiovascular examI Rhythm:Regular Rate:Normal     Neuro/Psych Depression negative neurological ROS  negative psych ROS   GI/Hepatic PUD, (+) Cirrhosis       , Here for EGD colon  States no longer requiring paracentesis - none since the attempts in 09/2019   Endo/Other  negative endocrine ROSdiabetes, Type 2, Oral Hypoglycemic Agents  Renal/GU negative Renal ROS  negative genitourinary   Musculoskeletal  (+) Arthritis , Osteoarthritis,    Abdominal   Peds negative pediatric ROS (+)  Hematology negative hematology ROS (+)   Anesthesia Other Findings   Reproductive/Obstetrics negative OB ROS                             Anesthesia Physical Anesthesia Plan  ASA: III  Anesthesia Plan: General   Post-op Pain Management:    Induction: Intravenous  PONV Risk Score and Plan: 2 and TIVA, Propofol infusion and Treatment may vary due to age or medical condition  Airway Management Planned: Simple Face Mask and Nasal Cannula  Additional Equipment:   Intra-op Plan:   Post-operative Plan:   Informed Consent: I have reviewed the patients History and Physical, chart, labs and discussed the procedure including the risks, benefits and alternatives for the proposed anesthesia with the patient or authorized representative who has indicated his/her understanding and acceptance.      Dental advisory given  Plan Discussed with: CRNA  Anesthesia Plan Comments: (Plan Full PPE use  Plan GA with GETA as needed d/w pt -WTP with same after Q&A)        Anesthesia Quick Evaluation

## 2019-12-04 NOTE — Anesthesia Postprocedure Evaluation (Signed)
Anesthesia Post Note  Patient: Lawrence Rogers  Procedure(s) Performed: ESOPHAGOGASTRODUODENOSCOPY (EGD) WITH PROPOFOL (N/A ) COLONOSCOPY WITH PROPOFOL (N/A ) ESOPHAGEAL BANDING POLYPECTOMY  Patient location during evaluation: PACU Anesthesia Type: MAC Level of consciousness: awake, sedated and patient cooperative Pain management: pain level controlled Vital Signs Assessment: post-procedure vital signs reviewed and stable Respiratory status: spontaneous breathing, nonlabored ventilation and respiratory function stable Cardiovascular status: stable Postop Assessment: no apparent nausea or vomiting Anesthetic complications: no     Last Vitals:  Vitals:   12/04/19 1007 12/04/19 1200  BP:    Pulse:  63  Resp:    Temp: 36.7 C (!) (P) 36.2 C  SpO2:      Last Pain:  Vitals:   12/04/19 1040  TempSrc:   PainSc: 0-No pain                 Jaycub,Emaan Gary

## 2019-12-07 ENCOUNTER — Other Ambulatory Visit (INDEPENDENT_AMBULATORY_CARE_PROVIDER_SITE_OTHER): Payer: Self-pay | Admitting: Internal Medicine

## 2019-12-07 DIAGNOSIS — D500 Iron deficiency anemia secondary to blood loss (chronic): Secondary | ICD-10-CM

## 2019-12-07 LAB — SURGICAL PATHOLOGY

## 2019-12-08 ENCOUNTER — Other Ambulatory Visit (INDEPENDENT_AMBULATORY_CARE_PROVIDER_SITE_OTHER): Payer: Self-pay | Admitting: *Deleted

## 2019-12-08 DIAGNOSIS — D500 Iron deficiency anemia secondary to blood loss (chronic): Secondary | ICD-10-CM

## 2019-12-08 DIAGNOSIS — K7460 Unspecified cirrhosis of liver: Secondary | ICD-10-CM

## 2019-12-08 DIAGNOSIS — R188 Other ascites: Secondary | ICD-10-CM

## 2019-12-24 ENCOUNTER — Ambulatory Visit (INDEPENDENT_AMBULATORY_CARE_PROVIDER_SITE_OTHER): Payer: BC Managed Care – PPO | Admitting: Gastroenterology

## 2019-12-29 LAB — CBC
HCT: 32.1 % — ABNORMAL LOW (ref 38.5–50.0)
Hemoglobin: 9.9 g/dL — ABNORMAL LOW (ref 13.2–17.1)
MCH: 23.7 pg — ABNORMAL LOW (ref 27.0–33.0)
MCHC: 30.8 g/dL — ABNORMAL LOW (ref 32.0–36.0)
MCV: 76.8 fL — ABNORMAL LOW (ref 80.0–100.0)
MPV: 10.9 fL (ref 7.5–12.5)
Platelets: 126 10*3/uL — ABNORMAL LOW (ref 140–400)
RBC: 4.18 10*6/uL — ABNORMAL LOW (ref 4.20–5.80)
RDW: 15.8 % — ABNORMAL HIGH (ref 11.0–15.0)
WBC: 5.9 10*3/uL (ref 3.8–10.8)

## 2020-01-01 ENCOUNTER — Other Ambulatory Visit (INDEPENDENT_AMBULATORY_CARE_PROVIDER_SITE_OTHER): Payer: Self-pay | Admitting: *Deleted

## 2020-01-01 DIAGNOSIS — D500 Iron deficiency anemia secondary to blood loss (chronic): Secondary | ICD-10-CM

## 2020-01-01 DIAGNOSIS — K7460 Unspecified cirrhosis of liver: Secondary | ICD-10-CM

## 2020-01-11 ENCOUNTER — Other Ambulatory Visit (INDEPENDENT_AMBULATORY_CARE_PROVIDER_SITE_OTHER): Payer: Self-pay | Admitting: *Deleted

## 2020-01-11 DIAGNOSIS — K7460 Unspecified cirrhosis of liver: Secondary | ICD-10-CM

## 2020-01-11 DIAGNOSIS — D500 Iron deficiency anemia secondary to blood loss (chronic): Secondary | ICD-10-CM

## 2020-01-20 ENCOUNTER — Encounter (INDEPENDENT_AMBULATORY_CARE_PROVIDER_SITE_OTHER): Payer: Self-pay | Admitting: Gastroenterology

## 2020-01-20 ENCOUNTER — Other Ambulatory Visit: Payer: Self-pay

## 2020-01-20 ENCOUNTER — Ambulatory Visit (INDEPENDENT_AMBULATORY_CARE_PROVIDER_SITE_OTHER): Payer: Managed Care, Other (non HMO) | Admitting: Gastroenterology

## 2020-01-20 VITALS — BP 97/66 | HR 81 | Temp 97.2°F | Ht >= 80 in | Wt 221.6 lb

## 2020-01-20 DIAGNOSIS — K7460 Unspecified cirrhosis of liver: Secondary | ICD-10-CM

## 2020-01-20 DIAGNOSIS — D500 Iron deficiency anemia secondary to blood loss (chronic): Secondary | ICD-10-CM | POA: Diagnosis not present

## 2020-01-20 NOTE — [place] Notes (Signed)
Patient profile: Lawrence Rogers is a 59 y.o. male seen for f/up of cirrhosis.  He also has a history of iron deficiency anemia and was last seen at time of EGD colonoscopy in December 2020.  Complex past months ago history of Karlene Lineman cirrhosis complicated by grade 3 esophageal varices, hepatic hydrothorax status post right-sided VATS August 2020.  History of Present Illness: Lawrence Rogers is seen today for follow-up of cirrhosis.  Overall he feels he is doing well.  He does not feel he has any abdominal ascites.  Lower extremity edema is usually well controlled, does have some mild swelling on days that he works and he is active.  He inspects airplane tires.  His weight is down about 9 pounds since October and feels that this is mainly fluid weight he has lost.  Only complaint is occasional nausea, this sometimes occurs after medication, may be correlated to taking meds on empty stomach.  No vomiting.  Reports his appetite is good and he gets hungry for food.  Usually eats 3 meals a day.  He denies any GERD symptoms on Protonix.  No dysphagia. Denies any symptoms of encephalopathy.  Has 1-2 bowel is day.  Stools are dark on iron.  Denies any abdominal pain, constipation, melena, rectal bleeding  Wt Readings from Last 3 Encounters:  01/20/20 221 lb 9.6 oz (100.5 kg)  10/26/19 228 lb (103.4 kg)  10/07/19 230 lb 8 oz (104.6 kg)     Last Colonoscopy: December 2020--(5) 5 to 7 mm polyps descending, to 7 to 12 mm polyp in sigmoid.  Last Endoscopy:  11/2019--Grade 3 varices midesophagus and lower 3rd of esophagus, 5 bands successfully placed with incomplete eradication, irregular Z-line, severe portal hypertensive gastropathy.Repeat upper endoscopy recommended in 3 months     Past Medical History:  Past Medical History:  Diagnosis Date  . Arthritis   . Cirrhosis ([place])   . Depression   . Diabetes [facility])     Problem List: Patient Active Problem List   Diagnosis Date Noted  . Empyema lung  ([place]) 07/20/2019  . Acute respiratory failure with hypoxia ([place]) 07/20/2019  . Empyema ([place]) 07/20/2019  . Acute respiratory distress 07/19/2019  . Pleural effusion 07/19/2019  . Gastric ulcer 09/22/2018  . Other cirrhosis of liver ([place]) 08/07/2018  . Diabetes ([place])   . Cirrhosis of liver with ascites [facility])     Past Surgical History: Past Surgical History:  Procedure Laterality Date  . CATARACT EXTRACTION W/PHACO Left 10/26/2019   Procedure: CATARACT EXTRACTION PHACO AND INTRAOCULAR LENS PLACEMENT LEFT EYE CDE=5.20;  Surgeon: Baruch Goldmann, MD;  Location: [facility];  Service: Ophthalmology;  Laterality: Left;  left  . CATARACT EXTRACTION W/PHACO Right 11/09/2019   Procedure: CATARACT EXTRACTION PHACO AND INTRAOCULAR LENS PLACEMENT (IOC);  Surgeon: Baruch Goldmann, MD;  Location: [facility];  Service: Ophthalmology;  Laterality: Right;  CDE: 3.03  . COLONOSCOPY WITH PROPOFOL N/A 12/04/2019   Procedure: COLONOSCOPY WITH PROPOFOL;  Surgeon: Rogene Houston, MD;  Location: [facility];  Service: Endoscopy;  Laterality: N/A;  . ESOPHAGEAL BANDING N/A 08/29/2018   Procedure: ESOPHAGEAL BANDING;  Surgeon: Rogene Houston, MD;  [facility];  Service: Endoscopy;  Laterality: N/A;  . ESOPHAGEAL BANDING  10/24/2018   Procedure: ESOPHAGEAL BANDING;  Surgeon: Rogene Houston, MD;  [facility];  Service: Endoscopy;;  3 bands  . ESOPHAGEAL BANDING  12/04/2019   Procedure: ESOPHAGEAL BANDING;  Surgeon: Rogene Houston, MD;  [facility]  [facility];  Service: Endoscopy;;  . ESOPHAGOGASTRODUODENOSCOPY (EGD) WITH PROPOFOL N/A 08/29/2018   Procedure: ESOPHAGOGASTRODUODENOSCOPY (EGD) WITH PROPOFOL;  Surgeon: Rogene Houston, MD;  [facility];  Service: Endoscopy;  Laterality: N/A;  9:30  . ESOPHAGOGASTRODUODENOSCOPY (EGD) WITH PROPOFOL N/A 10/24/2018   Procedure: ESOPHAGOGASTRODUODENOSCOPY (EGD) WITH PROPOFOL;  Surgeon: Rogene Houston, MD;  [facility];  Service:  Endoscopy;  Laterality: N/A;  7:30  . ESOPHAGOGASTRODUODENOSCOPY (EGD) WITH PROPOFOL N/A 12/04/2019   Procedure: ESOPHAGOGASTRODUODENOSCOPY (EGD) WITH PROPOFOL;  Surgeon: Rogene Houston, MD;  [facility];  Service: Endoscopy;  Laterality: N/A;  . IR PARACENTESIS  04/18/2018  . IR TRANSCATHETER BX  04/18/2018  . IR US GUIDE VASC ACCESS RIGHT  04/18/2018  . IR VENOGRAM HEPATIC W HEMODYNAMIC EVALUATION  04/18/2018  . PLEURAL EFFUSION DRAINAGE Right 07/22/2019   Procedure: Drainage Of Pleural Effusion;  Surgeon: Lajuana Matte, MD;  Location: [place];  Service: Thoracic;  Laterality: Right;  . POLYPECTOMY  12/04/2019   Procedure: POLYPECTOMY;  Surgeon: Rogene Houston, MD;  [facility];  Service: Endoscopy;;  hepatic flexure, cecal, proximal transverse colon  . VIDEO ASSISTED THORACOSCOPY (VATS)/DECORTICATION Right 07/22/2019   Procedure: VIDEO ASSISTED THORACOSCOPY (VATS)/DECORTICATION;  Surgeon: Lajuana Matte, MD;  Location: [place];  Service: Thoracic;  Laterality: Right;    Allergies: No Known Allergies    Home Medications:  Current Outpatient Medications:  .  ferrous sulfate 325 (65 FE) MG tablet, Take 1 tablet (325 mg total) by mouth daily with breakfast., Disp: , Rfl:  .  furosemide (LASIX) 40 MG tablet, Take 80 mg by mouth daily. , Disp: , Rfl:  .  glimepiride (AMARYL) 2 MG tablet, Take 2 mg by mouth daily., Disp: , Rfl:  .  metFORMIN (GLUCOPHAGE-XR) 500 MG 24 hr tablet, Take 2 tablets (1,000 mg total) by mouth 2 (two) times daily., Disp: , Rfl: 2 .  pantoprazole (PROTONIX) 40 MG tablet, TAKE ONE TABLET (40MG TOTAL) BY MOUTH DAILY BEFORE BREAKFAST (Patient taking differently: Take 80 mg by mouth daily. ), Disp: 30 tablet, Rfl: 5 .  spironolactone (ALDACTONE) 100 MG tablet, Take 2 tablets (200 mg total) by mouth daily., Disp: , Rfl:  .  traMADol (ULTRAM) 50 MG tablet, Take 50 mg by mouth every 6 (six) hours as needed (back pain.). , Disp: , Rfl:  .  ketorolac  (ACULAR) 0.5 % ophthalmic solution, Place 1 drop into both eyes as directed. , Disp: , Rfl:  .  moxifloxacin (VIGAMOX) 0.5 % ophthalmic solution, Apply 1 drop to eye as directed. , Disp: , Rfl:  .  prednisoLONE acetate (PRED FORTE) 1 % ophthalmic suspension, Place 1 drop into both eyes as directed. , Disp: , Rfl:    Family History: family history is not on file.    Social History:   reports that he quit smoking about 25 years ago. His smoking use included cigarettes. He has a 3.75 pack-year smoking history. He has never used smokeless tobacco. He reports that he does not drink alcohol or use drugs.   Review of Systems: Constitutional: Denies weight loss/weight gain  Eyes: No changes in vision. ENT: No oral lesions, sore throat.  GI: see HPI.  Heme/Lymph: No easy bruising.  CV: No chest pain.  GU: No hematuria.  Integumentary: No rashes.  Neuro: No headaches.  Psych: No depression/anxiety.  Endocrine: No heat/cold intolerance.  Allergic/Immunologic: No urticaria.  Resp: No cough, SOB.  Musculoskeletal: No joint swelling.  Physical Examination: BP 97/66 (BP Location: Right Arm, Patient Position: Sitting, Cuff Size: Large)   Pulse 81   Temp (!) 97.2 F (36.2 C) (Temporal)   Ht 6' 8"  (2.032 m)   Wt 221 lb 9.6 oz (100.5 kg)   BMI 24.34 kg/m  Gen: NAD, alert and oriented x 4 HEENT: PEERLA, EOMI, Neck: supple, no JVD Chest: CTA bilaterally, no wheezes, crackles, or other adventitious sounds CV: RRR, no m/g/c/r Abd: soft, NT, ND, +BS in all four quadrants; no HSM, guarding, ridigity, or rebound tenderness Ext: no edema, well perfused with 2+ pulses, Skin: no rash or lesions noted on observed skin Lymph: no noted LAD  Data Reviewed:  01/07/20--Hgb 9.9, MCV 76, platelet 126 December 2020-hemoglobin 10.4, MCV 80, platelet 102 November 2020-hemoglobin 9.9, MCV 79, iron 31, ferritin 14, percent sat 7  09/2019-IMPRESSION: Insufficient ascites for  paracentesis.  Assessment/Plan: Mr. Stoke is a 59 y.o. male   US IMPRESSION 09/2019 Cirrhotic appearing liver with splenomegaly, spleen measuring 18.5 cm length with calculated volume of 1588 mL. No other definite upper abdominal sonographic abnormalities.   [place] Screening US - due April 2021 AFP due March 2021  Mercedes was seen today for follow-up.  Diagnoses and all orders for this visit:  Iron deficiency anemia due to chronic blood loss -     Comprehensive Metabolic Panel (CMET) -     CBC with Differential -     Fe+TIBC+Fer  Cirrhosis of liver without ascites, unspecified hepatic cirrhosis type (HCC) -     Comprehensive Metabolic Panel (CMET) -     CBC with Differential -     Fe+TIBC+Fer   1.  Cirrhosis-also established with [facility] hepatology which she sees next month, has not been identified as transplant candidate in past 2/2 low MELD.  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  He is on Lasix 80 mg daily and Aldactone 100 mg daily, clinically no ascites or lower extremity edema on exam, wt down and he feels improvement in fluid.  Check CMP and if electrolytes normal continue current dose.  He is due for AFP next month and HCC [place] screening in April. Immune to hepatitis A but needs hep B vaccine in future.   2.  Grade 3 esophageal varices-per last note needs EGD in 3 months, will plan for March/April 2021.   3. IDA-colonoscopy negative, EGd as above. Needs to continue iron supplement. Will repeat CBC and iron studies today.   Additional recommendations per Dr Olevia Perches review.   Order for egd to be placed when covid restrictions lifted.    I personally performed the service, non-incident to. (WP)  Laurine Blazer, [facility]

## 2020-01-20 NOTE — Patient Instructions (Signed)
We are checking labs today for evaluation, will call w/ results

## 2020-01-21 LAB — CBC WITH DIFFERENTIAL/PLATELET
Absolute Monocytes: 484 cells/uL (ref 200–950)
Basophils Absolute: 50 cells/uL (ref 0–200)
Basophils Relative: 0.8 %
Eosinophils Absolute: 149 cells/uL (ref 15–500)
Eosinophils Relative: 2.4 %
HCT: 33.5 % — ABNORMAL LOW (ref 38.5–50.0)
Hemoglobin: 10.7 g/dL — ABNORMAL LOW (ref 13.2–17.1)
Lymphs Abs: 1903 cells/uL (ref 850–3900)
MCH: 24.8 pg — ABNORMAL LOW (ref 27.0–33.0)
MCHC: 31.9 g/dL — ABNORMAL LOW (ref 32.0–36.0)
MCV: 77.7 fL — ABNORMAL LOW (ref 80.0–100.0)
MPV: 11 fL (ref 7.5–12.5)
Monocytes Relative: 7.8 %
Neutro Abs: 3615 cells/uL (ref 1500–7800)
Neutrophils Relative %: 58.3 %
Platelets: 122 10*3/uL — ABNORMAL LOW (ref 140–400)
RBC: 4.31 10*6/uL (ref 4.20–5.80)
RDW: 16 % — ABNORMAL HIGH (ref 11.0–15.0)
Total Lymphocyte: 30.7 %
WBC: 6.2 10*3/uL (ref 3.8–10.8)

## 2020-01-21 LAB — COMPREHENSIVE METABOLIC PANEL
AG Ratio: 1.9 (calc) (ref 1.0–2.5)
ALT: 38 U/L (ref 9–46)
AST: 49 U/L — ABNORMAL HIGH (ref 10–35)
Albumin: 3.8 g/dL (ref 3.6–5.1)
Alkaline phosphatase (APISO): 116 U/L (ref 35–144)
BUN: 12 mg/dL (ref 7–25)
CO2: 27 mmol/L (ref 20–32)
Calcium: 9.5 mg/dL (ref 8.6–10.3)
Chloride: 104 mmol/L (ref 98–110)
Creat: 0.82 mg/dL (ref 0.70–1.33)
Globulin: 2 g/dL (calc) (ref 1.9–3.7)
Glucose, Bld: 111 mg/dL — ABNORMAL HIGH (ref 65–99)
Potassium: 3.6 mmol/L (ref 3.5–5.3)
Sodium: 141 mmol/L (ref 135–146)
Total Bilirubin: 1 mg/dL (ref 0.2–1.2)
Total Protein: 5.8 g/dL — ABNORMAL LOW (ref 6.1–8.1)

## 2020-01-21 LAB — IRON,TIBC AND FERRITIN PANEL
%SAT: 6 % (calc) — ABNORMAL LOW (ref 20–48)
Ferritin: 16 ng/mL — ABNORMAL LOW (ref 38–380)
Iron: 29 ug/dL — ABNORMAL LOW (ref 50–180)
TIBC: 467 mcg/dL (calc) — ABNORMAL HIGH (ref 250–425)

## 2020-01-25 ENCOUNTER — Telehealth (INDEPENDENT_AMBULATORY_CARE_PROVIDER_SITE_OTHER): Payer: Self-pay | Admitting: *Deleted

## 2020-01-25 NOTE — Telephone Encounter (Signed)
Patient is due for repeat EGD poss banding in march - he wants to know if this can be put off until June or July

## 2020-02-06 NOTE — Telephone Encounter (Signed)
Yes

## 2020-02-08 NOTE — Telephone Encounter (Signed)
EGD noted in recall for July

## 2020-03-10 ENCOUNTER — Other Ambulatory Visit (INDEPENDENT_AMBULATORY_CARE_PROVIDER_SITE_OTHER): Payer: Self-pay | Admitting: *Deleted

## 2020-03-10 DIAGNOSIS — K259 Gastric ulcer, unspecified as acute or chronic, without hemorrhage or perforation: Secondary | ICD-10-CM

## 2020-03-10 MED ORDER — PANTOPRAZOLE SODIUM 40 MG PO TBEC: 40.0000 mg | DELAYED_RELEASE_TABLET | Freq: Two times a day (BID) | ORAL | 5 refills | Status: DC

## 2020-03-14 ENCOUNTER — Encounter (INDEPENDENT_AMBULATORY_CARE_PROVIDER_SITE_OTHER): Payer: Self-pay | Admitting: *Deleted

## 2020-04-14 ENCOUNTER — Telehealth (INDEPENDENT_AMBULATORY_CARE_PROVIDER_SITE_OTHER): Payer: Self-pay | Admitting: *Deleted

## 2020-04-14 ENCOUNTER — Other Ambulatory Visit (INDEPENDENT_AMBULATORY_CARE_PROVIDER_SITE_OTHER): Payer: Self-pay | Admitting: *Deleted

## 2020-04-14 DIAGNOSIS — K7460 Unspecified cirrhosis of liver: Secondary | ICD-10-CM

## 2020-04-14 DIAGNOSIS — R188 Other ascites: Secondary | ICD-10-CM

## 2020-04-14 DIAGNOSIS — R600 Localized edema: Secondary | ICD-10-CM

## 2020-04-14 NOTE — Telephone Encounter (Signed)
Patient called the office on 04/12/2020. He had called to make appointment with Dr.Rehman or Thayer Headings. He has gained 10 lbs over 1 month. He wanted to be checked to see if he needed a Paracentesis. He reports that he is taking his medication as prescribed  Fluid intake about 32 oz daily. This consist of water and occasional  soft drink.  This was reviewed with Dr.Rehman . He ask that the patient take Furosemide 40 mg twice a day. Have a Basic Metabolic Panel and Magnesium drawn. Followed by OV with him the week of May 3 rd.  Patient called and made aware. Lab order sent to [facility]. Patient will be called with the appointment.

## 2020-04-14 NOTE — Progress Notes (Signed)
Basic metabolic and Magnesium orderd.

## 2020-04-15 ENCOUNTER — Other Ambulatory Visit (INDEPENDENT_AMBULATORY_CARE_PROVIDER_SITE_OTHER): Payer: Self-pay | Admitting: *Deleted

## 2020-04-15 DIAGNOSIS — R600 Localized edema: Secondary | ICD-10-CM

## 2020-04-15 DIAGNOSIS — K7460 Unspecified cirrhosis of liver: Secondary | ICD-10-CM

## 2020-04-15 DIAGNOSIS — R188 Other ascites: Secondary | ICD-10-CM

## 2020-04-15 LAB — MAGNESIUM: Magnesium: 2 mg/dL (ref 1.5–2.5)

## 2020-04-15 LAB — BASIC METABOLIC PANEL
BUN: 11 mg/dL (ref 7–25)
CO2: 25 mmol/L (ref 20–32)
Calcium: 9.1 mg/dL (ref 8.6–10.3)
Chloride: 103 mmol/L (ref 98–110)
Creat: 0.87 mg/dL (ref 0.70–1.33)
Glucose, Bld: 416 mg/dL — ABNORMAL HIGH (ref 65–99)
Potassium: 4.1 mmol/L (ref 3.5–5.3)
Sodium: 137 mmol/L (ref 135–146)
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
## 2020-04-29 LAB — BASIC METABOLIC PANEL
BUN: 13 mg/dL (ref 7–25)
CO2: 24 mmol/L (ref 20–32)
Calcium: 8.6 mg/dL (ref 8.6–10.3)
Chloride: 105 mmol/L (ref 98–110)
Creat: 0.87 mg/dL (ref 0.70–1.33)
Glucose, Bld: 184 mg/dL — ABNORMAL HIGH (ref 65–99)
Potassium: 4.3 mmol/L (ref 3.5–5.3)
Sodium: 136 mmol/L (ref 135–146)

## 2020-05-02 ENCOUNTER — Other Ambulatory Visit (INDEPENDENT_AMBULATORY_CARE_PROVIDER_SITE_OTHER): Payer: Self-pay | Admitting: *Deleted

## 2020-05-02 DIAGNOSIS — K7460 Unspecified cirrhosis of liver: Secondary | ICD-10-CM

## 2020-05-02 DIAGNOSIS — D500 Iron deficiency anemia secondary to blood loss (chronic): Secondary | ICD-10-CM

## 2020-05-02 DIAGNOSIS — R188 Other ascites: Secondary | ICD-10-CM

## 2020-05-02 DIAGNOSIS — K259 Gastric ulcer, unspecified as acute or chronic, without hemorrhage or perforation: Secondary | ICD-10-CM

## 2020-05-04 ENCOUNTER — Other Ambulatory Visit (INDEPENDENT_AMBULATORY_CARE_PROVIDER_SITE_OTHER): Payer: Self-pay | Admitting: *Deleted

## 2020-05-04 DIAGNOSIS — D500 Iron deficiency anemia secondary to blood loss (chronic): Secondary | ICD-10-CM

## 2020-05-04 DIAGNOSIS — R188 Other ascites: Secondary | ICD-10-CM

## 2020-05-04 DIAGNOSIS — K259 Gastric ulcer, unspecified as acute or chronic, without hemorrhage or perforation: Secondary | ICD-10-CM

## 2020-05-04 DIAGNOSIS — K7460 Unspecified cirrhosis of liver: Secondary | ICD-10-CM

## 2020-05-18 ENCOUNTER — Ambulatory Visit (INDEPENDENT_AMBULATORY_CARE_PROVIDER_SITE_OTHER): Payer: Managed Care, Other (non HMO) | Admitting: Gastroenterology

## 2020-05-18 ENCOUNTER — Encounter (INDEPENDENT_AMBULATORY_CARE_PROVIDER_SITE_OTHER): Payer: Self-pay | Admitting: Gastroenterology

## 2020-05-18 ENCOUNTER — Encounter (INDEPENDENT_AMBULATORY_CARE_PROVIDER_SITE_OTHER): Payer: Self-pay | Admitting: *Deleted

## 2020-05-18 ENCOUNTER — Other Ambulatory Visit: Payer: Self-pay

## 2020-05-18 VITALS — BP 101/68 | HR 97 | Temp 97.2°F | Ht >= 80 in | Wt 229.9 lb

## 2020-05-18 DIAGNOSIS — K7460 Unspecified cirrhosis of liver: Secondary | ICD-10-CM

## 2020-05-18 NOTE — Patient Instructions (Signed)
We are checking labs today for evaluation and scheduling an ultrasound.  We will contact you with recommendations.  Continue current doses of medications in interim.

## 2020-05-18 NOTE — Progress Notes (Signed)
Patient profile: Lawrence Rogers is a 59 y.o. male seen for evaluation of cirrhosis, he has a history of Lawrence Rogers cirrhosis and is followed by Lawrence Rogers hepatology as well.  He was last seen virtually March 2021.  He has not been a transplant candidate in the past secondary to a low meld.  History of Present Illness: Lawrence Rogers is seen today for follow-up.  He reports making appointment to be seen acutely last week when he developed some "heavy breathing" he ultimately feels are related to chest congestion and felt it improved with taking DayQuil and NyQuil.  He feels he is back to his baseline and is breathing normally.  He reports usually Protonix controls his GERD symptoms well.  He is taking PPI twice daily.  He denies any dysphagia.  He is eating well but he is trying to avoid fried foods and he is feeling his weight is down some-between his last appointment he had gained to 238 and is now down to 229.  He reports his appetite is good he denies any nausea vomiting.  He is compliant with his Lasix doses of 40 mg twice a day and has Aldactone 200 mg daily.  He does watch his salt intake.  Has some issues with edema in his lower extremities, he works Facilities manager, he elevates his legs when he gets home from work which helps the swelling.  Wt Readings from Last 3 Encounters:  05/18/20 229 lb 14.4 oz (104.3 kg)  01/20/20 221 lb 9.6 oz (100.5 kg)  10/26/19 228 lb (103.4 kg)    Last Colonoscopy: December 2020--(5) 5 to 7 mm polyps descending, to 7 to 12 mm polyp in sigmoid.  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 Repeat upper endoscopy recommended in 3 months   Past Medical History:  Past Medical History:  Diagnosis Date  . Arthritis   . Cirrhosis ([place])   . Depression   . Diabetes [facility])     Problem List: Patient Active  Problem List   Diagnosis Date Noted  . Empyema lung ([place]) 07/20/2019  . Acute respiratory failure with hypoxia ([place]) 07/20/2019  . Empyema ([place]) 07/20/2019  . Acute respiratory distress 07/19/2019  . Pleural effusion 07/19/2019  . Gastric ulcer 09/22/2018  . Other cirrhosis of liver ([place]) 08/07/2018  . Diabetes ([place])   . Cirrhosis of liver with ascites [facility])     Past Surgical History: Past Surgical History:  Procedure Laterality Date  . CATARACT EXTRACTION W/PHACO Left 10/26/2019   Procedure: CATARACT EXTRACTION PHACO AND INTRAOCULAR LENS PLACEMENT LEFT EYE CDE=5.20;  Surgeon: Baruch Goldmann, MD;  Location: [facility];  Service: Ophthalmology;  Laterality: Left;  left  . CATARACT EXTRACTION W/PHACO Right 11/09/2019   Procedure: CATARACT EXTRACTION PHACO AND INTRAOCULAR LENS PLACEMENT (IOC);  Surgeon: Baruch Goldmann, MD;  Location: [facility];  Service: Ophthalmology;  Laterality: Right;  CDE: 3.03  . COLONOSCOPY WITH PROPOFOL N/A 12/04/2019   Procedure: COLONOSCOPY WITH PROPOFOL;  Surgeon: Lawrence Houston, MD;  [facility];  Service: Endoscopy;  Laterality: N/A;  . ESOPHAGEAL BANDING N/A 08/29/2018   Procedure: ESOPHAGEAL BANDING;  Surgeon: Lawrence Houston, MD;  [facility];  Service: Endoscopy;  Laterality: N/A;  . ESOPHAGEAL BANDING  10/24/2018   Procedure: ESOPHAGEAL BANDING;  Surgeon: Lawrence Houston, MD;  [facility];  Service: Endoscopy;;  3 bands  .  ESOPHAGEAL BANDING  12/04/2019   Procedure: ESOPHAGEAL BANDING;  Surgeon: Lawrence Houston, MD;  [facility];  Service: Endoscopy;;  . ESOPHAGOGASTRODUODENOSCOPY (EGD) WITH PROPOFOL N/A 08/29/2018   Procedure: ESOPHAGOGASTRODUODENOSCOPY (EGD) WITH PROPOFOL;  Surgeon: Lawrence Houston, MD;  [facility];  Service: Endoscopy;  Laterality: N/A;  9:30  . ESOPHAGOGASTRODUODENOSCOPY (EGD) WITH PROPOFOL N/A 10/24/2018   Procedure: ESOPHAGOGASTRODUODENOSCOPY (EGD) WITH PROPOFOL;  Surgeon:  Lawrence Houston, MD;  [facility];  Service: Endoscopy;  Laterality: N/A;  7:30  . ESOPHAGOGASTRODUODENOSCOPY (EGD) WITH PROPOFOL N/A 12/04/2019   Procedure: ESOPHAGOGASTRODUODENOSCOPY (EGD) WITH PROPOFOL;  Surgeon: Lawrence Houston, MD;  [facility];  Service: Endoscopy;  Laterality: N/A;  . IR PARACENTESIS  04/18/2018  . IR TRANSCATHETER BX  04/18/2018  . IR US GUIDE VASC ACCESS RIGHT  04/18/2018  . IR VENOGRAM HEPATIC W HEMODYNAMIC EVALUATION  04/18/2018  . PLEURAL EFFUSION DRAINAGE Right 07/22/2019   Procedure: Drainage Of Pleural Effusion;  Surgeon: Lajuana Matte, MD;  Location: [place];  Service: Thoracic;  Laterality: Right;  . POLYPECTOMY  12/04/2019   Procedure: POLYPECTOMY;  Surgeon: Lawrence Houston, MD;  [facility];  Service: Endoscopy;;  hepatic flexure, cecal, proximal transverse colon  . VIDEO ASSISTED THORACOSCOPY (VATS)/DECORTICATION Right 07/22/2019   Procedure: VIDEO ASSISTED THORACOSCOPY (VATS)/DECORTICATION;  Surgeon: Lajuana Matte, MD;  Location: [place];  Service: Thoracic;  Laterality: Right;    Allergies: No Known Allergies    Home Medications:  Current Outpatient Medications:  .  ferrous sulfate 325 (65 FE) MG tablet, Take 1 tablet (325 mg total) by mouth daily with breakfast., Disp: , Rfl:  .  furosemide (LASIX) 40 MG tablet, Take 80 mg by mouth daily. , Disp: , Rfl:  .  glimepiride (AMARYL) 2 MG tablet, Take 2 mg by mouth daily., Disp: , Rfl:  .  metFORMIN (GLUCOPHAGE-XR) 500 MG 24 hr tablet, Take 2 tablets (1,000 mg total) by mouth 2 (two) times daily., Disp: , Rfl: 2 .  pantoprazole (PROTONIX) 40 MG tablet, Take 1 tablet (40 mg total) by mouth 2 (two) times daily. TAKE ONE TABLET (40MG TOTAL) BY MOUTH DAILY BEFORE BREAKFAST, Disp: 60 tablet, Rfl: 5 .  Pseudoeph-Doxylamine-DM-APAP (NYQUIL PO), Take by mouth. Patient takes the in the Am and Pm daily once a day as needed for congestion., Disp: , Rfl:  .  spironolactone  (ALDACTONE) 100 MG tablet, Take 2 tablets (200 mg total) by mouth daily., Disp: , Rfl:  .  traMADol (ULTRAM) 50 MG tablet, Take 50 mg by mouth every 6 (six) hours as needed (back pain.). , Disp: , Rfl:    Family History: family history is not on file.    Social History:   reports that he quit smoking about 25 years ago. His smoking use included cigarettes. He has a 3.75 pack-year smoking history. He has never used smokeless tobacco. He reports that he does not drink alcohol or use drugs.   Review of Systems: Constitutional: Denies weight loss/weight gain  Eyes: No changes in vision. ENT: No oral lesions, sore throat.  GI: see HPI.  Heme/Lymph: No easy bruising.  CV: No chest pain.  GU: No hematuria.  Integumentary: No rashes.  Neuro: No headaches.  Psych: No depression/anxiety.  Endocrine: No heat/cold intolerance.  Allergic/Immunologic: No urticaria.  Resp: No cough, SOB.  Musculoskeletal: No joint swelling.    Physical Examination: BP 101/68 (BP Location: Right Arm, Patient Position: Sitting, Cuff Size: Large)  Pulse 97   Temp (!) 97.2 F (36.2 C) (Temporal)   Ht 6' 8"  (2.032 m)   Wt 229 lb 14.4 oz (104.3 kg)   BMI 25.26 kg/m  Gen: NAD, alert and oriented x 4 HEENT: PEERLA, EOMI, Neck: supple, no JVD Chest: CTA bilaterally, no wheezes, crackles, or other adventitious sounds CV: RRR, no m/g/c/r Abd: soft, NT, ND, +BS in all four quadrants; no HSM, guarding, ridigity, or rebound tenderness. ? Mild ascites, No tense abd ascites noted. Ext: no edema, well perfused with 2+ pulses, Skin: no rash or lesions noted on observed skin Lymph: no noted LAD  Data Reviewed:  [place] 09/2019---IMPRESSION: Insufficient ascites for paracentesis  09/2019-IMPRESSION: Cirrhotic appearing liver with splenomegaly, spleen measuring 18.5 cm length with calculated volume of 1588 mL. No other definite upper abdominal sonographic abnormalities.  Assessment/Plan: [name] Rogers is a 58 y.o. male    1.  Ascites-on Lasix 80 milligrams daily and Aldactone 200 mg daily.  He does not feel in any abdominal swelling or bloating.  He will continue current doses of diuretics. Pt will weigh daily at home and contact us if gains more than 5lbs.   2. NASH--He is due for his routine HCC screening US and AFP which were ordered today.  He also follows with [facility] hepatology and sees them every 6 months.  3. Lawrence Rogers is hesitant to reschedule banding procedure given financial cost.  He is to contact me when he is ready to schedule.  4. Hx of IDA-will repeat CBC and iron labs today, he is complaint w/ his iron supplement.    If labs stable can follow-up in 6 months.  Lawrence Rogers was seen today for follow-up.  Diagnoses and all orders for this visit:  Cirrhosis of liver without ascites, unspecified hepatic cirrhosis type ([place]) -     CBC with Differential -     COMPLETE METABOLIC PANEL WITH GFR -     AFP tumor marker -     INR/PT -     Fe+TIBC+Fer -     US Abdomen Complete; Future        I personally performed the service, non-incident to. (WP)  Laurine Blazer, [facility]

## 2020-05-19 LAB — COMPLETE METABOLIC PANEL WITH GFR
AG Ratio: 1.8 (calc) (ref 1.0–2.5)
ALT: 35 U/L (ref 9–46)
AST: 66 U/L — ABNORMAL HIGH (ref 10–35)
Albumin: 3.5 g/dL — ABNORMAL LOW (ref 3.6–5.1)
Alkaline phosphatase (APISO): 142 U/L (ref 35–144)
BUN: 12 mg/dL (ref 7–25)
CO2: 25 mmol/L (ref 20–32)
Calcium: 8.8 mg/dL (ref 8.6–10.3)
Chloride: 103 mmol/L (ref 98–110)
Creat: 0.81 mg/dL (ref 0.70–1.33)
GFR, Est African American: 114 mL/min/{1.73_m2} (ref >=60)
GFR, Est Non African American: 98 mL/min/{1.73_m2} (ref >=60)
Globulin: 2 g/dL (calc) (ref 1.9–3.7)
Glucose, Bld: 105 mg/dL (ref 65–139)
Potassium: 3.7 mmol/L (ref 3.5–5.3)
Sodium: 138 mmol/L (ref 135–146)
Total Bilirubin: 1.4 mg/dL — ABNORMAL HIGH (ref 0.2–1.2)
Total Protein: 5.5 g/dL — ABNORMAL LOW (ref 6.1–8.1)

## 2020-05-19 LAB — CBC WITH DIFFERENTIAL/PLATELET
Absolute Monocytes: 321 cells/uL (ref 200–950)
Basophils Absolute: 32 cells/uL (ref 0–200)
Basophils Relative: 0.5 %
Eosinophils Absolute: 88 cells/uL (ref 15–500)
Eosinophils Relative: 1.4 %
HCT: 36.5 % — ABNORMAL LOW (ref 38.5–50.0)
Hemoglobin: 11.3 g/dL — ABNORMAL LOW (ref 13.2–17.1)
Lymphs Abs: 2073 cells/uL (ref 850–3900)
MCH: 24.7 pg — ABNORMAL LOW (ref 27.0–33.0)
MCHC: 31 g/dL — ABNORMAL LOW (ref 32.0–36.0)
MCV: 79.9 fL — ABNORMAL LOW (ref 80.0–100.0)
MPV: 10.8 fL (ref 7.5–12.5)
Monocytes Relative: 5.1 %
Neutro Abs: 3786 cells/uL (ref 1500–7800)
Neutrophils Relative %: 60.1 %
Platelets: 121 10*3/uL — ABNORMAL LOW (ref 140–400)
RBC: 4.57 10*6/uL (ref 4.20–5.80)
RDW: 16.1 % — ABNORMAL HIGH (ref 11.0–15.0)
Total Lymphocyte: 32.9 %
WBC: 6.3 10*3/uL (ref 3.8–10.8)

## 2020-05-19 LAB — PROTIME-INR
INR: 1
Prothrombin Time: 11 s (ref 9.0–11.5)

## 2020-05-19 LAB — AFP TUMOR MARKER: AFP-Tumor Marker: 4.8 ng/mL (ref <6.1)

## 2020-05-19 LAB — IRON,TIBC AND FERRITIN PANEL
%SAT: 7 % (calc) — ABNORMAL LOW (ref 20–48)
Ferritin: 22 ng/mL — ABNORMAL LOW (ref 38–380)
Iron: 31 ug/dL — ABNORMAL LOW (ref 50–180)
TIBC: 423 mcg/dL (calc) (ref 250–425)

## 2020-05-20 ENCOUNTER — Ambulatory Visit (HOSPITAL_COMMUNITY)
Admission: RE | Admit: 2020-05-20 | Discharge: 2020-05-20 | Disposition: A | Discharge status: Home / Self Care | Payer: Managed Care, Other (non HMO) | Source: Ambulatory Visit | Attending: Gastroenterology | Admitting: Gastroenterology

## 2020-05-20 ENCOUNTER — Other Ambulatory Visit: Payer: Self-pay

## 2020-05-20 DIAGNOSIS — K7460 Unspecified cirrhosis of liver: Secondary | ICD-10-CM | POA: Insufficient documentation

## 2020-05-23 ENCOUNTER — Other Ambulatory Visit (INDEPENDENT_AMBULATORY_CARE_PROVIDER_SITE_OTHER): Payer: Self-pay | Admitting: *Deleted

## 2020-05-23 DIAGNOSIS — R188 Other ascites: Secondary | ICD-10-CM

## 2020-05-23 MED ORDER — ALBUMIN HUMAN 25 % IV SOLN: 12.5000 g | Freq: Once | INTRAVENOUS | Status: DC

## 2020-05-25 ENCOUNTER — Ambulatory Visit (HOSPITAL_COMMUNITY): Admission: RE | Admit: 2020-05-25 | Payer: Managed Care, Other (non HMO) | Source: Ambulatory Visit

## 2020-06-08 ENCOUNTER — Encounter (INDEPENDENT_AMBULATORY_CARE_PROVIDER_SITE_OTHER): Payer: Self-pay | Admitting: *Deleted

## 2020-06-16 ENCOUNTER — Ambulatory Visit (INDEPENDENT_AMBULATORY_CARE_PROVIDER_SITE_OTHER): Payer: Managed Care, Other (non HMO) | Admitting: Gastroenterology

## 2020-08-15 LAB — PROTIME-INR
INR: 1
Prothrombin Time: 10.8 s (ref 9.0–11.5)

## 2020-08-15 LAB — AFP TUMOR MARKER: AFP-Tumor Marker: 4.8 ng/mL (ref <6.1)

## 2020-11-21 ENCOUNTER — Telehealth (INDEPENDENT_AMBULATORY_CARE_PROVIDER_SITE_OTHER): Payer: Self-pay | Admitting: *Deleted

## 2020-11-21 NOTE — Telephone Encounter (Signed)
Mardene Celeste from Dr Karie Kirks called - they want to know if patient can take a Statin drug for cholesterol - please call patricia at [phone]

## 2020-11-21 NOTE — Telephone Encounter (Signed)
I have called Fraser Din, Dr.Knowlton's nurse and told her that per Dr.Castaneda the patient may take a Statin.

## 2020-11-21 NOTE — Telephone Encounter (Signed)
May I ask you to review patient's chart to see if his PCP may put him on a cholesterol medication? As Dr.Rehman is on vacation this week.  Thank You.

## 2020-11-21 NOTE — Telephone Encounter (Signed)
Hi Lawrence Rogers,  Yes, the patient can take a statin.  Thanks

## 2021-02-21 ENCOUNTER — Other Ambulatory Visit (HOSPITAL_COMMUNITY): Payer: Self-pay | Admitting: Nurse Practitioner

## 2021-02-21 DIAGNOSIS — M8580 Other specified disorders of bone density and structure, unspecified site: Secondary | ICD-10-CM

## 2021-03-14 ENCOUNTER — Ambulatory Visit (HOSPITAL_COMMUNITY)
Admission: RE | Admit: 2021-03-14 | Discharge: 2021-03-14 | Disposition: A | Discharge status: Home / Self Care | Payer: Managed Care, Other (non HMO) | Source: Ambulatory Visit | Attending: Nurse Practitioner | Admitting: Nurse Practitioner

## 2021-03-14 DIAGNOSIS — M8580 Other specified disorders of bone density and structure, unspecified site: Secondary | ICD-10-CM | POA: Insufficient documentation

## 2021-05-06 ENCOUNTER — Other Ambulatory Visit: Payer: Self-pay

## 2021-05-06 ENCOUNTER — Ambulatory Visit (INDEPENDENT_AMBULATORY_CARE_PROVIDER_SITE_OTHER): Payer: Managed Care, Other (non HMO)

## 2021-05-06 ENCOUNTER — Ambulatory Visit
Admission: EM | Admit: 2021-05-06 | Discharge: 2021-05-06 | Disposition: A | Discharge status: Home / Self Care | Payer: Managed Care, Other (non HMO) | Attending: Emergency Medicine | Admitting: Emergency Medicine

## 2021-05-06 DIAGNOSIS — M79601 Pain in right arm: Secondary | ICD-10-CM

## 2021-05-06 DIAGNOSIS — W19XXXA Unspecified fall, initial encounter: Secondary | ICD-10-CM | POA: Diagnosis not present

## 2021-05-06 DIAGNOSIS — M25521 Pain in right elbow: Secondary | ICD-10-CM | POA: Diagnosis not present

## 2021-05-06 DIAGNOSIS — S59901A Unspecified injury of right elbow, initial encounter: Secondary | ICD-10-CM

## 2021-05-06 DIAGNOSIS — Y990 Civilian activity done for income or pay: Secondary | ICD-10-CM

## 2021-05-06 NOTE — Discharge Instructions (Signed)
X-ray negative for fracture or dislocation Continue conservative management of rest, ice, and gentle stretches Ace applied Continue to alternate ibuprofen and tylenol as needed for pain Follow up with orthopedist for further evaluation and management Return or go to the ER if you have any new or worsening symptoms (fever, chills, chest pain, redness, swelling, bruising, deformity, etc...)

## 2021-05-06 NOTE — ED Triage Notes (Signed)
Pt had fall at work on Wednesday  And injured right upper arm and shoulder, pain also in elbow , has limited rom

## 2021-05-06 NOTE — ED Provider Notes (Signed)
[place]   [id number] 05/06/21 Arrival Time: 0957  CC: RT arm PAIN  SUBJECTIVE: History from: patient. Lawrence Rogers is a 60 y.o. male complains of RT arm pain and injury x 3 days ago.  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  Localizes the pain to the upper arm and elbow.  Describes the pain as constant, dull, and achy in character.  Has tried tramadol with minimal relief.  Symptoms are made worse with ROM.  Denies similar symptoms in the past.  Complains of associated swelling.  Denies fever, chills, erythema, ecchymosis, weakness, numbness and tingling.  ROS: As per HPI.  All other pertinent ROS negative.     Past Medical History:  Diagnosis Date  . Arthritis   . Cirrhosis ([place])   . Depression   . Diabetes [facility])    Past Surgical History:  Procedure Laterality Date  . CATARACT EXTRACTION W/PHACO Left 10/26/2019   Procedure: CATARACT EXTRACTION PHACO AND INTRAOCULAR LENS PLACEMENT LEFT EYE CDE=5.20;  Surgeon: Baruch Goldmann, MD;  [facility];  Service: Ophthalmology;  Laterality: Left;  left  . CATARACT EXTRACTION W/PHACO Right 11/09/2019   Procedure: CATARACT EXTRACTION PHACO AND INTRAOCULAR LENS PLACEMENT (IOC);  Surgeon: Baruch Goldmann, MD;  [facility];  Service: Ophthalmology;  Laterality: Right;  CDE: 3.03  . COLONOSCOPY WITH PROPOFOL N/A 12/04/2019   Procedure: COLONOSCOPY WITH PROPOFOL;  Surgeon: Rogene Houston, MD;  [facility];  Service: Endoscopy;  Laterality: N/A;  . ESOPHAGEAL BANDING N/A 08/29/2018   Procedure: ESOPHAGEAL BANDING;  Surgeon: Rogene Houston, MD;  [facility];  Service: Endoscopy;  Laterality: N/A;  . ESOPHAGEAL BANDING  10/24/2018   Procedure: ESOPHAGEAL BANDING;  Surgeon: Rogene Houston, MD;  [facility];  Service: Endoscopy;;  3 bands  . ESOPHAGEAL BANDING  12/04/2019   Procedure: ESOPHAGEAL BANDING;  Surgeon: Rogene Houston, MD;  [facility];  Service: Endoscopy;;  . ESOPHAGOGASTRODUODENOSCOPY (EGD) WITH PROPOFOL N/A 08/29/2018   Procedure: ESOPHAGOGASTRODUODENOSCOPY (EGD) WITH PROPOFOL;  Surgeon: Rogene Houston, MD;  [facility];  Service: Endoscopy;  Laterality: N/A;  9:30  . ESOPHAGOGASTRODUODENOSCOPY (EGD) WITH PROPOFOL N/A 10/24/2018   Procedure: ESOPHAGOGASTRODUODENOSCOPY (EGD) WITH PROPOFOL;  Surgeon: Rogene Houston, MD;  [facility];  Service: Endoscopy;  Laterality: N/A;  7:30  . ESOPHAGOGASTRODUODENOSCOPY (EGD) WITH PROPOFOL N/A 12/04/2019   Procedure: ESOPHAGOGASTRODUODENOSCOPY (EGD) WITH PROPOFOL;  Surgeon: Rogene Houston, MD;  [facility];  Service: Endoscopy;  Laterality: N/A;  . IR PARACENTESIS  04/18/2018  . IR TRANSCATHETER BX  04/18/2018  . IR US GUIDE VASC ACCESS RIGHT  04/18/2018  . IR VENOGRAM HEPATIC W HEMODYNAMIC EVALUATION  04/18/2018  . PLEURAL EFFUSION DRAINAGE Right 07/22/2019   Procedure: Drainage Of Pleural Effusion;  Surgeon: Lajuana Matte, MD;  Location: [place];  Service: Thoracic;  Laterality: Right;  . POLYPECTOMY  12/04/2019   Procedure: POLYPECTOMY;  Surgeon: Rogene Houston, MD;  [facility];  Service: Endoscopy;;  hepatic flexure, cecal, proximal transverse colon  . VIDEO ASSISTED THORACOSCOPY (VATS)/DECORTICATION Right 07/22/2019   Procedure: VIDEO ASSISTED THORACOSCOPY (VATS)/DECORTICATION;  Surgeon: Lajuana Matte, MD;  [facility];  Service: Thoracic;  Laterality: Right;   No Known Allergies Current Facility-Administered Medications on File Prior to Encounter  Medication Dose Route Frequency Provider Last Rate Last Admin  . albumin  human 25 % solution 12.5 g  12.5 g Intravenous Once Rehman, Mechele Dawley, MD       Current Outpatient Medications on File Prior to Encounter  Medication Sig Dispense Refill  . ferrous sulfate 325 (65 FE) MG tablet Take 1 tablet (325 mg total) by mouth daily with  breakfast.    . furosemide (LASIX) 40 MG tablet Take 80 mg by mouth daily.     Marland Kitchen glimepiride (AMARYL) 2 MG tablet Take 2 mg by mouth daily.    . metFORMIN (GLUCOPHAGE-XR) 500 MG 24 hr tablet Take 2 tablets (1,000 mg total) by mouth 2 (two) times daily.  2  . pantoprazole (PROTONIX) 40 MG tablet Take 1 tablet (40 mg total) by mouth 2 (two) times daily. TAKE ONE TABLET (40MG TOTAL) BY MOUTH DAILY BEFORE BREAKFAST 60 tablet 5  . Pseudoeph-Doxylamine-DM-APAP (NYQUIL PO) Take by mouth. Patient takes the in the Am and Pm daily once a day as needed for congestion.    Marland Kitchen spironolactone (ALDACTONE) 100 MG tablet Take 2 tablets (200 mg total) by mouth daily.    . traMADol (ULTRAM) 50 MG tablet Take 50 mg by mouth every 6 (six) hours as needed (back pain.).      Social History   Socioeconomic History  . Marital status: Single    Spouse name: Not on file  . Number of children: Not on file  . Years of education: Not on file  . Highest education level: Not on file  Occupational History  . Not on file  Tobacco Use  . Smoking status: Former Smoker    Packs/day: 0.25    Years: 15.00    Pack years: 3.75    Types: Cigarettes    Quit date: 11/30/1994    Years since quitting: 26.4  . Smokeless tobacco: Never Used  Vaping Use  . Vaping Use: Never used  Substance and Sexual Activity  . Alcohol use: Never    Comment: Has not drank in 30 yrs.   . Drug use: Never  . Sexual activity: Not Currently  Other Topics Concern  . Not on file  Social History Narrative  . Not on file   Social Determinants of Health   Financial Resource Strain: Not on file  Food Insecurity: Not on file  Transportation Needs: Not on file  Physical Activity: Not on file  Stress: Not on file  Social Connections: Not on file  Intimate Partner Violence: Not on file   History reviewed. No pertinent family history.  OBJECTIVE:  Vitals:   05/06/21 1050  BP: 129/69  Pulse: 88  Resp: 18  Temp: 98.4 F (36.9 C)  SpO2: 93%     General appearance: ALERT; in no acute distress.  Head: NCAT Lungs: Normal respiratory effort CV: Radial pulse 2+ Musculoskeletal: RT ARM Inspection: Skin warm, dry, clear and intact without obvious erythema, effusion, or ecchymosis.  Palpation: Diffusely TTP over posterior elbow ROM: LROM about the shoulder and elbow Strength: 5/5 shld abduction, 5/5 shld adduction, 4+/5 elbow flexion, 4+/5 elbow extension, 5/5 grip strength Skin: warm and dry Neurologic: Ambulates without difficulty Psychological: alert and cooperative; normal mood and affect  DIAGNOSTIC STUDIES:  DG Humerus Right  Result Date: 05/06/2021 CLINICAL DATA:  Acute RIGHT arm pain following fall 4 days ago. Initial encounter. EXAM: RIGHT HUMERUS - 2+ VIEW COMPARISON:  None. FINDINGS: No fracture or dislocation identified. Mild degenerative changes at the glenohumeral joint noted. No focal bony lesions are present. IMPRESSION: No acute abnormality. Electronically Signed  By: Margarette Canada M.D.   On: 05/06/2021 11:18    X-rays negative for bony abnormalities including fracture, or dislocation.  No soft tissue swelling.    I have reviewed the x-rays myself and the radiologist interpretation. I am in agreement with the radiologist interpretation.     ASSESSMENT & PLAN:  1. Right elbow pain   2. Elbow injury, right, initial encounter   3. Work related injury    X-ray negative for fracture or dislocation Continue conservative management of rest, ice, and gentle stretches Ace applied Continue to alternate ibuprofen and tylenol as needed for pain Follow up with orthopedist for further evaluation and management Return or go to the ER if you have any new or worsening symptoms (fever, chills, chest pain, redness, swelling, bruising, deformity, etc...)    Reviewed expectations re: course of current medical issues. Questions answered. Outlined signs and symptoms indicating need for more acute intervention. Patient  verbalized understanding. After Visit Summary given.    Lestine Box, PA-C 05/06/21 1135

## 2021-05-17 DIAGNOSIS — M25521 Pain in right elbow: Secondary | ICD-10-CM | POA: Insufficient documentation

## 2021-06-01 DIAGNOSIS — M25511 Pain in right shoulder: Secondary | ICD-10-CM | POA: Insufficient documentation

## 2021-06-16 DIAGNOSIS — R079 Chest pain, unspecified: Secondary | ICD-10-CM | POA: Insufficient documentation

## 2021-06-21 ENCOUNTER — Ambulatory Visit
Admission: RE | Admit: 2021-06-21 | Discharge: 2021-06-21 | Disposition: A | Discharge status: Home / Self Care | Payer: Self-pay | Source: Ambulatory Visit | Attending: Orthopedic Surgery | Admitting: Orthopedic Surgery

## 2021-06-21 ENCOUNTER — Other Ambulatory Visit: Payer: Self-pay | Admitting: Orthopedic Surgery

## 2021-06-21 ENCOUNTER — Other Ambulatory Visit: Payer: Self-pay

## 2021-06-21 DIAGNOSIS — R079 Chest pain, unspecified: Secondary | ICD-10-CM

## 2021-07-05 DIAGNOSIS — M75120 Complete rotator cuff tear or rupture of unspecified shoulder, not specified as traumatic: Secondary | ICD-10-CM | POA: Insufficient documentation

## 2021-07-18 DIAGNOSIS — Z4789 Encounter for other orthopedic aftercare: Secondary | ICD-10-CM | POA: Insufficient documentation

## 2021-07-19 NOTE — Progress Notes (Signed)
Sent message, via epic in basket, requesting orders in epic from surgeon.  

## 2021-07-21 NOTE — Patient Instructions (Signed)
DUE TO COVID-19 ONLY ONE VISITOR IS ALLOWED TO COME WITH YOU AND STAY IN THE WAITING ROOM ONLY DURING PRE OP AND PROCEDURE DAY OF SURGERY. THE 1 VISITOR  MAY VISIT WITH YOU AFTER SURGERY IN YOUR PRIVATE ROOM DURING VISITING HOURS ONLY!               Lawrence Rogers   Your procedure is scheduled on: 08/04/21   Report to [facility] Main  Entrance   Report to admitting at: 12:30 PM     Call this number if you have problems the morning of surgery [phone]    Remember: NO SOLID FOOD AFTER MIDNIGHT THE NIGHT PRIOR TO SURGERY. NOTHING BY MOUTH EXCEPT CLEAR LIQUIDS UNTIL: 11:30 AM . PLEASE FINISH ENSURE DRINK PER SURGEON ORDER  WHICH NEEDS TO BE COMPLETED AT : 11:30 AM.   CLEAR LIQUID DIET  Foods Allowed                                                                     Foods Excluded  Coffee and tea, regular and decaf                             liquids that you cannot  Plain Jell-O any favor except red or purple                                           see through such as: Fruit ices (not with fruit pulp)                                     milk, soups, orange juice  Iced Popsicles                                    All solid food Carbonated beverages, regular and diet                                    Cranberry, grape and apple juices Sports drinks like Gatorade Lightly seasoned clear broth or consume(fat free) Sugar, honey syrup  Sample Menu Breakfast                                Lunch                                     Supper Cranberry juice                    Beef broth                            Chicken broth Jell-O  Grape juice                           Apple juice Coffee or tea                        Jell-O                                      Popsicle                                                Coffee or tea                        Coffee or tea  _____________________________________________________________________    BRUSH YOUR TEETH MORNING OF SURGERY AND RINSE YOUR MOUTH OUT, NO CHEWING GUM CANDY OR MINTS.    Take these medicines the morning of surgery with A SIP OF WATER: Pantoprazole  How to Manage Your Diabetes Before and After Surgery  Why is it important to control my blood sugar before and after surgery? Improving blood sugar levels before and after surgery helps healing and can limit problems. A way of improving blood sugar control is eating a healthy diet by:  Eating less sugar and carbohydrates  Increasing activity/exercise  Talking with your doctor about reaching your blood sugar goals High blood sugars (greater than 180 mg/dL) can raise your risk of infections and slow your recovery, so you will need to focus on controlling your diabetes during the weeks before surgery. Make sure that the doctor who takes care of your diabetes knows about your planned surgery including the date and location.  How do I manage my blood sugar before surgery? Check your blood sugar at least 4 times a day, starting 2 days before surgery, to make sure that the level is not too high or low. Check your blood sugar the morning of your surgery when you wake up and every 2 hours until you get to the Short Stay unit. If your blood sugar is less than 70 mg/dL, you will need to treat for low blood sugar: Do not take insulin. Treat a low blood sugar (less than 70 mg/dL) with  cup of clear juice (cranberry or apple), 4 glucose tablets, OR glucose gel. Recheck blood sugar in 15 minutes after treatment (to make sure it is greater than 70 mg/dL). If your blood sugar is not greater than 70 mg/dL on recheck, call [phone] for further instructions. Report your blood sugar to the short stay nurse when you get to Short Stay.  If you are admitted to the hospital after surgery: Your blood sugar will be checked by the staff and you will probably be given insulin after surgery (instead of oral diabetes medicines) to make sure  you have good blood sugar levels. The goal for blood sugar control after surgery is 80-180 mg/dL.   WHAT DO I DO ABOUT MY DIABETES MEDICATION?  Do not take oral diabetes medicines (pills) the morning of surgery.  THE DAY BEFORE SURGERY, take metformin and amaryl (morning) as usual.       THE MORNING OF SURGERY,DO NOT TAKE ANY ORAL DIABETIC MEDICATIONS DAY OF YOUR SURGERY  You may not have any metal on your body including hair pins and              piercings  Do not wear jewelry,lotions, powders or perfumes, deodorant              Men may shave face and neck.   Do not bring valuables to the hospital. [place].  Contacts, dentures or bridgework may not be worn into surgery.  Leave suitcase in the car. After surgery it may be brought to your room.     Patients discharged the day of surgery will not be allowed to drive home. IF YOU ARE HAVING SURGERY AND GOING HOME THE SAME DAY, YOU MUST HAVE AN ADULT TO DRIVE YOU HOME AND BE WITH YOU FOR 24 HOURS. YOU MAY GO HOME BY TAXI OR UBER OR ORTHERWISE, BUT AN ADULT MUST ACCOMPANY YOU HOME AND STAY WITH YOU FOR 24 HOURS.  Name and phone number of your driver:  Special Instructions: N/A              Please read over the following fact sheets you were given: _____________________________________________________________________          [facility] - Preparing for Surgery Before surgery, you can play an important role.  Because skin is not sterile, your skin needs to be as free of germs as possible.  You can reduce the number of germs on your skin by washing with CHG (chlorahexidine gluconate) soap before surgery.  CHG is an antiseptic cleaner which kills germs and bonds with the skin to continue killing germs even after washing. Please DO NOT use if you have an allergy to CHG or antibacterial soaps.  If your skin becomes reddened/irritated stop using the CHG and inform  your nurse when you arrive at Short Stay. Do not shave (including legs and underarms) for at least 48 hours prior to the first CHG shower.  You may shave your face/neck. Please follow these instructions carefully:  1.  Shower with CHG Soap the night before surgery and the  morning of Surgery.  2.  If you choose to wash your hair, wash your hair first as usual with your  normal  shampoo.  3.  After you shampoo, rinse your hair and body thoroughly to remove the  shampoo.                           4.  Use CHG as you would any other liquid soap.  You can apply chg directly  to the skin and wash                       Gently with a scrungie or clean washcloth.  5.  Apply the CHG Soap to your body ONLY FROM THE NECK DOWN.   Do not use on face/ open                           Wound or open sores. Avoid contact with eyes, ears mouth and genitals (private parts).                       Wash face,  Genitals (private parts) with your normal soap.  6.  Wash thoroughly, paying special attention to the area where your surgery  will be performed.  7.  Thoroughly rinse your body with warm water from the neck down.  8.  DO NOT shower/wash with your normal soap after using and rinsing off  the CHG Soap.                9.  Pat yourself dry with a clean towel.            10.  Wear clean pajamas.            11.  Place clean sheets on your bed the night of your first shower and do not  sleep with pets. Day of Surgery : Do not apply any lotions/deodorants the morning of surgery.  Please wear clean clothes to the hospital/surgery center.  FAILURE TO FOLLOW THESE INSTRUCTIONS MAY RESULT IN THE CANCELLATION OF YOUR SURGERY PATIENT SIGNATURE_________________________________  NURSE SIGNATURE__________________________________   Incentive Spirometer  An incentive spirometer is a tool that can help keep your lungs clear and active. This tool measures how well you are filling your lungs with each breath. Taking  long deep breaths may help reverse or decrease the chance of developing breathing (pulmonary) problems (especially infection) following: A long period of time when you are unable to move or be active. BEFORE THE PROCEDURE  If the spirometer includes an indicator to show your best effort, your nurse or respiratory therapist will set it to a desired goal. If possible, sit up straight or lean slightly forward. Try not to slouch. Hold the incentive spirometer in an upright position. INSTRUCTIONS FOR USE  Sit on the edge of your bed if possible, or sit up as far as you can in bed or on a chair. Hold the incentive spirometer in an upright position. Breathe out normally. Place the mouthpiece in your mouth and seal your lips tightly around it. Breathe in slowly and as deeply as possible, raising the piston or the ball toward the top of the column. Hold your breath for 3-5 seconds or for as long as possible. Allow the piston or ball to fall to the bottom of the column. Remove the mouthpiece from your mouth and breathe out normally. Rest for a few seconds and repeat Steps 1 through 7 at least 10 times every 1-2 hours when you are awake. Take your time and take a few normal breaths between deep breaths. The spirometer may include an indicator to show your best effort. Use the indicator as a goal to work toward during each repetition. After each set of 10 deep breaths, practice coughing to be sure your lungs are clear. If you have an incision (the cut made at the time of surgery), support your incision when coughing by placing a pillow or rolled up towels firmly against it. Once you are able to get out of bed, walk around indoors and cough well. You may stop using the incentive spirometer when instructed by your caregiver.  RISKS AND COMPLICATIONS Take your time so you do not get dizzy or light-headed. If you are in pain, you may need to take or ask for pain medication before doing incentive spirometry. It  is harder to take a deep breath if you are having pain. AFTER USE Rest and breathe slowly and easily. It can be helpful to keep track of a log of your progress. Your caregiver can provide you with a simple table to help with this. If you are using the  spirometer at home, follow these instructions: [place] IF:  You are having difficultly using the spirometer. You have trouble using the spirometer as often as instructed. Your pain medication is not giving enough relief while using the spirometer. You develop fever of 100.5 F (38.1 C) or higher. SEEK IMMEDIATE MEDICAL CARE IF:  You cough up bloody sputum that had not been present before. You develop fever of 102 F (38.9 C) or greater. You develop worsening pain at or near the incision site. MAKE SURE YOU:  Understand these instructions. Will watch your condition. Will get help right away if you are not doing well or get worse. Document Released: 04/15/2007 Document Revised: 02/25/2012 Document Reviewed: 06/16/2007 [facility] Patient Information 2014 [employer], [place].   ________________________________________________________________________  ________________________________________________________________________

## 2021-07-24 ENCOUNTER — Encounter (HOSPITAL_COMMUNITY): Payer: Self-pay

## 2021-07-24 ENCOUNTER — Encounter (INDEPENDENT_AMBULATORY_CARE_PROVIDER_SITE_OTHER): Payer: Self-pay

## 2021-07-24 ENCOUNTER — Encounter (HOSPITAL_COMMUNITY)
Admission: RE | Admit: 2021-07-24 | Discharge: 2021-07-24 | Disposition: A | Discharge status: Home / Self Care | Payer: No Typology Code available for payment source | Source: Ambulatory Visit | Attending: Orthopedic Surgery | Admitting: Orthopedic Surgery

## 2021-07-24 ENCOUNTER — Other Ambulatory Visit: Payer: Self-pay

## 2021-07-24 DIAGNOSIS — M75101 Unspecified rotator cuff tear or rupture of right shoulder, not specified as traumatic: Secondary | ICD-10-CM | POA: Insufficient documentation

## 2021-07-24 DIAGNOSIS — E118 Type 2 diabetes mellitus with unspecified complications: Secondary | ICD-10-CM | POA: Diagnosis not present

## 2021-07-24 DIAGNOSIS — D696 Thrombocytopenia, unspecified: Secondary | ICD-10-CM | POA: Diagnosis not present

## 2021-07-24 DIAGNOSIS — Z87891 Personal history of nicotine dependence: Secondary | ICD-10-CM | POA: Diagnosis not present

## 2021-07-24 DIAGNOSIS — Z7984 Long term (current) use of oral hypoglycemic drugs: Secondary | ICD-10-CM | POA: Insufficient documentation

## 2021-07-24 DIAGNOSIS — Z79899 Other long term (current) drug therapy: Secondary | ICD-10-CM | POA: Insufficient documentation

## 2021-07-24 DIAGNOSIS — K7460 Unspecified cirrhosis of liver: Secondary | ICD-10-CM | POA: Insufficient documentation

## 2021-07-24 DIAGNOSIS — Z01818 Encounter for other preprocedural examination: Secondary | ICD-10-CM | POA: Diagnosis not present

## 2021-07-24 LAB — CBC
HCT: 38.8 % — ABNORMAL LOW (ref 39.0–52.0)
Hemoglobin: 12.3 g/dL — ABNORMAL LOW (ref 13.0–17.0)
MCH: 28.6 pg (ref 26.0–34.0)
MCHC: 31.7 g/dL (ref 30.0–36.0)
MCV: 90.2 fL (ref 80.0–100.0)
Platelets: 97 10*3/uL — ABNORMAL LOW (ref 150–400)
RBC: 4.3 MIL/uL (ref 4.22–5.81)
RDW: 16.1 % — ABNORMAL HIGH (ref 11.5–15.5)
WBC: 5.4 10*3/uL (ref 4.0–10.5)
nRBC: 0 % (ref 0.0–0.2)

## 2021-07-24 LAB — BASIC METABOLIC PANEL
Anion gap: 9 (ref 5–15)
BUN: 10 mg/dL (ref 6–20)
CO2: 24 mmol/L (ref 22–32)
Calcium: 8.9 mg/dL (ref 8.9–10.3)
Chloride: 104 mmol/L (ref 98–111)
Creatinine, Ser: 1.03 mg/dL (ref 0.61–1.24)
GFR, Estimated: >60 mL/min (ref >60)
Glucose, Bld: 244 mg/dL — ABNORMAL HIGH (ref 70–99)
Potassium: 3.4 mmol/L — ABNORMAL LOW (ref 3.5–5.1)
Sodium: 137 mmol/L (ref 135–145)

## 2021-07-24 LAB — HEMOGLOBIN A1C
Hgb A1c MFr Bld: 6.1 % — ABNORMAL HIGH (ref 4.8–5.6)
Mean Plasma Glucose: 128.37 mg/dL

## 2021-07-24 LAB — GLUCOSE, CAPILLARY: Glucose-Capillary: 239 mg/dL — ABNORMAL HIGH (ref 70–99)

## 2021-07-24 NOTE — Progress Notes (Signed)
COVID Vaccine Completed: Yes Date COVID Vaccine completed: 2021 x 3 COVID vaccine manufacturer: Pfizer  and  Moderna     PCP - Dr. [name] Cardiologist -   Chest x-ray -06/21/21  EKG -  Stress Test -  ECHO - 10/21/19 Cardiac Cath -  Pacemaker/ICD device last checked:  Sleep Study -  CPAP -   Fasting Blood Sugar - 120's Checks Blood Sugar ___1__ times a week.  Blood Thinner Instructions: Aspirin Instructions: Last Dose:  Anesthesia review:   Patient denies shortness of breath, fever, cough and chest pain at PAT appointment   Patient verbalized understanding of instructions that were given to them at the PAT appointment. Patient was also instructed that they will need to review over the PAT instructions again at home before surgery.

## 2021-07-26 NOTE — Progress Notes (Signed)
Anesthesia Chart Review   Case: [id number] Date/Time: 08/04/21 1415   Procedure: SHOULDER ARTHROSCOPY WITH ROTATOR CUFF REPAIR,EXTENSIVE DEBRIDEMENT, SUBACROMIAL DECOMPRESSION,DISTAL CLAVICLE RESCTION (Right) - 141mn   Anesthesia type: Choice   Pre-op diagnosis: RIght Shoulder rotator cuff tear   [facility] ROOM 07 / [facility]   Surgeons: RNicholes Stairs MD       DISCUSSION:60 y.o. former smoker with h/o DM II, cirrhosis with chronic thrombocytopenia, right shoulder rotator cuff tear scheduled for above procedure 08/04/2021 with Dr. JVictorino December   CBG 239 at PAT visit, non fasting.  Will evaluate DOS.    VS: BP 138/67   Pulse 95   Temp 36.9 C (Oral)   Resp 18   Ht 6' 2"  (1.88 m)   Wt 103.1 kg   SpO2 97%   BMI 29.18 kg/m   PROVIDERS: KLemmie Evens MD is PCP    LABS: Labs reviewed: Acceptable for surgery. (all labs ordered are listed, but only abnormal results are displayed)  Labs Reviewed  CBC - Abnormal; Notable for the following components:      Result Value   Hemoglobin 12.3 (*)    HCT 38.8 (*)    RDW 16.1 (*)    Platelets 97 (*)    All other components within normal limits  BASIC METABOLIC PANEL - Abnormal; Notable for the following components:   Potassium 3.4 (*)    Glucose, Bld 244 (*)    All other components within normal limits  HEMOGLOBIN A1C - Abnormal; Notable for the following components:   Hgb A1c MFr Bld 6.1 (*)    All other components within normal limits  GLUCOSE, CAPILLARY - Abnormal; Notable for the following components:   Glucose-Capillary 239 (*)    All other components within normal limits     IMAGES:   EKG: 07/24/2021 rate 89 bpm  Normal sinus rhythm Left axis deviation Minimal voltage criteria for LVH, may be normal variant ( R in aVL ) Abnormal ECG Since last tracing rate slower  CV: Echo 10/21/2019  1. Left ventricular ejection fraction, by visual estimation, is 60 to  65%. The left ventricle has normal function.  There is no left ventricular  hypertrophy.   2. Global right ventricle has normal systolic function.The right  ventricular size is normal. No increase in right ventricular wall  thickness.   3. Left atrial size was mildly dilated.   4. Right atrial size was normal.   5. Mild aortic valve annular calcification.   6. The mitral valve is grossly normal. Trace mitral valve regurgitation.   7. The tricuspid valve is grossly normal. Tricuspid valve regurgitation  is trivial.   8. The aortic valve is tricuspid. Aortic valve regurgitation is not  visualized.   9. The pulmonic valve was not well visualized. Pulmonic valve  regurgitation is trivial.  10. Normal pulmonary artery systolic pressure.  11. The tricuspid regurgitant velocity is 2.45 m/s, and with an assumed  right atrial pressure of 3 mmHg, the estimated right ventricular systolic  pressure is normal at 27.0 mmHg.  12. The inferior vena cava is normal in size with greater than 50%  respiratory variability, suggesting right atrial pressure of 3 mmHg.  13. Right pleural effusion noted.  Past Medical History:  Diagnosis Date   Arthritis    [place]    Depression    Diabetes ([facility]     Past Surgical History:  Procedure Laterality Date   CATARACT EXTRACTION W/PHACO Left 10/26/2019   Procedure: CATARACT  EXTRACTION PHACO AND INTRAOCULAR LENS PLACEMENT LEFT EYE CDE=5.20;  Surgeon: Baruch Goldmann, MD;  Location: [facility];  Service: Ophthalmology;  Laterality: Left;  left   CATARACT EXTRACTION W/PHACO Right 11/09/2019   Procedure: CATARACT EXTRACTION PHACO AND INTRAOCULAR LENS PLACEMENT (IOC);  Surgeon: Baruch Goldmann, MD;  Location: [facility];  Service: Ophthalmology;  Laterality: Right;  CDE: 3.03   COLONOSCOPY WITH PROPOFOL N/A 12/04/2019   Procedure: COLONOSCOPY WITH PROPOFOL;  Surgeon: Rogene Houston, MD;  [facility];  Service: Endoscopy;  Laterality: N/A;   ESOPHAGEAL BANDING N/A 08/29/2018   Procedure: ESOPHAGEAL  BANDING;  Surgeon: Rogene Houston, MD;  [facility];  Service: Endoscopy;  Laterality: N/A;   ESOPHAGEAL BANDING  10/24/2018   Procedure: ESOPHAGEAL BANDING;  Surgeon: Rogene Houston, MD;  [facility];  Service: Endoscopy;;  3 bands   ESOPHAGEAL BANDING  12/04/2019   Procedure: ESOPHAGEAL BANDING;  Surgeon: Rogene Houston, MD;  [facility];  Service: Endoscopy;;   ESOPHAGOGASTRODUODENOSCOPY (EGD) WITH PROPOFOL N/A 08/29/2018   Procedure: ESOPHAGOGASTRODUODENOSCOPY (EGD) WITH PROPOFOL;  Surgeon: Rogene Houston, MD;  [facility];  Service: Endoscopy;  Laterality: N/A;  9:30   ESOPHAGOGASTRODUODENOSCOPY (EGD) WITH PROPOFOL N/A 10/24/2018   Procedure: ESOPHAGOGASTRODUODENOSCOPY (EGD) WITH PROPOFOL;  Surgeon: Rogene Houston, MD;  [facility];  Service: Endoscopy;  Laterality: N/A;  7:30   ESOPHAGOGASTRODUODENOSCOPY (EGD) WITH PROPOFOL N/A 12/04/2019   Procedure: ESOPHAGOGASTRODUODENOSCOPY (EGD) WITH PROPOFOL;  Surgeon: Rogene Houston, MD;  [facility];  Service: Endoscopy;  Laterality: N/A;   IR PARACENTESIS  04/18/2018   IR TRANSCATHETER BX  04/18/2018   IR US GUIDE VASC ACCESS RIGHT  04/18/2018   IR VENOGRAM HEPATIC W HEMODYNAMIC EVALUATION  04/18/2018   PLEURAL EFFUSION DRAINAGE Right 07/22/2019   Procedure: Drainage Of Pleural Effusion;  Surgeon: Lajuana Matte, MD;  Location: [place];  Service: Thoracic;  Laterality: Right;   POLYPECTOMY  12/04/2019   Procedure: POLYPECTOMY;  Surgeon: Rogene Houston, MD;  [facility];  Service: Endoscopy;;  hepatic flexure, cecal, proximal transverse colon   VIDEO ASSISTED THORACOSCOPY (VATS)/DECORTICATION Right 07/22/2019   Procedure: VIDEO ASSISTED THORACOSCOPY (VATS)/DECORTICATION;  Surgeon: Lajuana Matte, MD;  Location: [place];  Service: Thoracic;  Laterality: Right;    MEDICATIONS:  atorvastatin (LIPITOR) 20 MG tablet   bumetanide (BUMEX) 2 MG tablet    cholecalciferol (VITAMIN D3) 25 MCG (1000 UNIT) tablet   ferrous sulfate 325 (65 FE) MG tablet   glimepiride (AMARYL) 2 MG tablet   metFORMIN (GLUCOPHAGE-XR) 500 MG 24 hr tablet   pantoprazole (PROTONIX) 40 MG tablet   spironolactone (ALDACTONE) 100 MG tablet   traMADol (ULTRAM) 50 MG tablet   triamcinolone cream (KENALOG) 0.5 %    albumin human 25 % solution 12.5 g    Konrad Felix, PA-C [facility] [phone]

## 2021-08-04 ENCOUNTER — Ambulatory Visit (HOSPITAL_COMMUNITY): Payer: No Typology Code available for payment source | Admitting: Physician Assistant

## 2021-08-04 ENCOUNTER — Ambulatory Visit (HOSPITAL_COMMUNITY)
Admission: RE | Admit: 2021-08-04 | Discharge: 2021-08-04 | Disposition: A | Discharge status: Home / Self Care | Payer: No Typology Code available for payment source | Attending: Orthopedic Surgery | Admitting: Orthopedic Surgery

## 2021-08-04 ENCOUNTER — Ambulatory Visit (HOSPITAL_COMMUNITY): Payer: No Typology Code available for payment source | Admitting: Certified Registered"

## 2021-08-04 ENCOUNTER — Encounter (HOSPITAL_COMMUNITY)
Admission: RE | Disposition: A | Discharge status: Home / Self Care | Payer: Self-pay | Source: Home / Self Care | Attending: Orthopedic Surgery

## 2021-08-04 ENCOUNTER — Encounter (HOSPITAL_COMMUNITY): Payer: Self-pay | Admitting: Orthopedic Surgery

## 2021-08-04 DIAGNOSIS — Z87891 Personal history of nicotine dependence: Secondary | ICD-10-CM | POA: Insufficient documentation

## 2021-08-04 DIAGNOSIS — M19011 Primary osteoarthritis, right shoulder: Secondary | ICD-10-CM | POA: Diagnosis not present

## 2021-08-04 DIAGNOSIS — Z7984 Long term (current) use of oral hypoglycemic drugs: Secondary | ICD-10-CM | POA: Diagnosis not present

## 2021-08-04 DIAGNOSIS — Z79899 Other long term (current) drug therapy: Secondary | ICD-10-CM | POA: Insufficient documentation

## 2021-08-04 DIAGNOSIS — E119 Type 2 diabetes mellitus without complications: Secondary | ICD-10-CM | POA: Insufficient documentation

## 2021-08-04 DIAGNOSIS — M75121 Complete rotator cuff tear or rupture of right shoulder, not specified as traumatic: Secondary | ICD-10-CM | POA: Insufficient documentation

## 2021-08-04 DIAGNOSIS — S46811A Strain of other muscles, fascia and tendons at shoulder and upper arm level, right arm, initial encounter: Secondary | ICD-10-CM | POA: Insufficient documentation

## 2021-08-04 DIAGNOSIS — M7541 Impingement syndrome of right shoulder: Secondary | ICD-10-CM | POA: Diagnosis not present

## 2021-08-04 HISTORY — PX: SHOULDER ARTHROSCOPY WITH ROTATOR CUFF REPAIR: SHX5685

## 2021-08-04 LAB — GLUCOSE, CAPILLARY
Glucose-Capillary: 98 mg/dL (ref 70–99)
Glucose-Capillary: 115 mg/dL — ABNORMAL HIGH (ref 70–99)

## 2021-08-04 SURGERY — ARTHROSCOPY, SHOULDER, WITH ROTATOR CUFF REPAIR
Anesthesia: General | Laterality: Right

## 2021-08-04 MED ORDER — PHENYLEPHRINE HCL-NACL 20-0.9 MG/250ML-% IV SOLN
INTRAVENOUS | Status: DC | PRN
  Administered 2021-08-04: 30 ug/min via INTRAVENOUS

## 2021-08-04 MED ORDER — ORAL CARE MOUTH RINSE: 15.0000 mL | Freq: Once | OROMUCOSAL | Status: AC

## 2021-08-04 MED ORDER — BUPIVACAINE LIPOSOME 1.3 % IJ SUSP
INTRAMUSCULAR | Status: DC | PRN
  Administered 2021-08-04: 10 mL via PERINEURAL

## 2021-08-04 MED ORDER — ONDANSETRON HCL 4 MG/2ML IJ SOLN
INTRAMUSCULAR | Status: DC | PRN
  Administered 2021-08-04: 4 mg via INTRAVENOUS

## 2021-08-04 MED ORDER — ROCURONIUM BROMIDE 10 MG/ML (PF) SYRINGE
PREFILLED_SYRINGE | INTRAVENOUS | Status: DC | PRN
  Administered 2021-08-04: 60 mg via INTRAVENOUS

## 2021-08-04 MED ORDER — ONDANSETRON HCL 4 MG PO TABS: 4.0000 mg | ORAL_TABLET | Freq: Three times a day (TID) | ORAL | 1 refills | Status: DC | PRN

## 2021-08-04 MED ORDER — CEFAZOLIN SODIUM-DEXTROSE 2-4 GM/100ML-% IV SOLN
2.0000 g | INTRAVENOUS | Status: AC
  Administered 2021-08-04: 2 g via INTRAVENOUS
  Filled 2021-08-04: qty 100

## 2021-08-04 MED ORDER — BUPIVACAINE-EPINEPHRINE (PF) 0.5% -1:200000 IJ SOLN
INTRAMUSCULAR | Status: DC | PRN
  Administered 2021-08-04: 15 mL via PERINEURAL

## 2021-08-04 MED ORDER — ONDANSETRON HCL 4 MG/2ML IJ SOLN
INTRAMUSCULAR | Status: AC
  Filled 2021-08-04: qty 2

## 2021-08-04 MED ORDER — LIDOCAINE 2% (20 MG/ML) 5 ML SYRINGE
INTRAMUSCULAR | Status: DC | PRN
  Administered 2021-08-04: 100 mg via INTRAVENOUS

## 2021-08-04 MED ORDER — LIDOCAINE 2% (20 MG/ML) 5 ML SYRINGE
INTRAMUSCULAR | Status: AC
  Filled 2021-08-04: qty 5

## 2021-08-04 MED ORDER — EPINEPHRINE 1 MG/ML IJ SOLN
INTRAMUSCULAR | Status: DC | PRN
  Administered 2021-08-04: 1 mL

## 2021-08-04 MED ORDER — FENTANYL CITRATE (PF) 100 MCG/2ML IJ SOLN
INTRAMUSCULAR | Status: AC
  Filled 2021-08-04: qty 2

## 2021-08-04 MED ORDER — MIDAZOLAM HCL 2 MG/2ML IJ SOLN
1.0000 mg | INTRAMUSCULAR | Status: DC
  Administered 2021-08-04: 1 mg via INTRAVENOUS
  Filled 2021-08-04: qty 2

## 2021-08-04 MED ORDER — BUPIVACAINE-EPINEPHRINE (PF) 0.25% -1:200000 IJ SOLN
INTRAMUSCULAR | Status: AC
  Filled 2021-08-04: qty 30

## 2021-08-04 MED ORDER — FENTANYL CITRATE (PF) 100 MCG/2ML IJ SOLN
50.0000 ug | INTRAMUSCULAR | Status: DC
  Administered 2021-08-04: 50 ug via INTRAVENOUS
  Filled 2021-08-04: qty 2

## 2021-08-04 MED ORDER — PROPOFOL 10 MG/ML IV BOLUS
INTRAVENOUS | Status: DC | PRN
  Administered 2021-08-04: 200 mg via INTRAVENOUS

## 2021-08-04 MED ORDER — FENTANYL CITRATE (PF) 100 MCG/2ML IJ SOLN
INTRAMUSCULAR | Status: DC | PRN
  Administered 2021-08-04: 100 ug via INTRAVENOUS

## 2021-08-04 MED ORDER — METHOCARBAMOL 500 MG PO TABS
500.0000 mg | ORAL_TABLET | Freq: Four times a day (QID) | ORAL | 1 refills | Status: DC | PRN
Start: 2021-08-04 — End: 2021-10-31

## 2021-08-04 MED ORDER — SODIUM CHLORIDE 0.9 % IR SOLN
Status: DC | PRN
  Administered 2021-08-04: 15000 mL

## 2021-08-04 MED ORDER — SUGAMMADEX SODIUM 200 MG/2ML IV SOLN
INTRAVENOUS | Status: DC | PRN
  Administered 2021-08-04: 200 mg via INTRAVENOUS

## 2021-08-04 MED ORDER — LACTATED RINGERS IV SOLN: INTRAVENOUS | Status: DC

## 2021-08-04 MED ORDER — DEXAMETHASONE SODIUM PHOSPHATE 10 MG/ML IJ SOLN
INTRAMUSCULAR | Status: AC
  Filled 2021-08-04: qty 1

## 2021-08-04 MED ORDER — CHLORHEXIDINE GLUCONATE 0.12 % MT SOLN
15.0000 mL | Freq: Once | OROMUCOSAL | Status: AC
  Administered 2021-08-04: 15 mL via OROMUCOSAL

## 2021-08-04 MED ORDER — OXYCODONE HCL 5 MG PO TABS: 5.0000 mg | ORAL_TABLET | Freq: Four times a day (QID) | ORAL | 0 refills | Status: DC | PRN

## 2021-08-04 MED ORDER — EPINEPHRINE PF 1 MG/ML IJ SOLN
INTRAMUSCULAR | Status: AC
  Filled 2021-08-04: qty 1

## 2021-08-04 MED ORDER — DEXAMETHASONE SODIUM PHOSPHATE 10 MG/ML IJ SOLN
INTRAMUSCULAR | Status: DC | PRN
  Administered 2021-08-04: 8 mg via INTRAVENOUS

## 2021-08-04 SURGICAL SUPPLY — 76 items
ANCHOR SWIVELOCK BIO 4.75X19.1 (Anchor) ×2 IMPLANT
BAG COUNTER SPONGE SURGICOUNT (BAG) ×2 IMPLANT
BLADE SURG 15 STRL LF DISP TIS (BLADE) IMPLANT
BLADE SURG 15 STRL SS (BLADE)
BLADE SURG SZ11 CARB STEEL (BLADE) ×2 IMPLANT
BURR OVAL 8 FLU 4.0X13 (MISCELLANEOUS) ×2 IMPLANT
CANNULA 5.75X7 CRYSTAL CLEAR (CANNULA) ×2 IMPLANT
CANNULA 5.75X71 LONG (CANNULA) IMPLANT
CANNULA PASSPORT BUTTON 8X4 (CANNULA) ×2 IMPLANT
CANNULA TWIST IN 8.25X7CM (CANNULA) IMPLANT
COVER MAYO STAND STRL (DRAPES) ×2 IMPLANT
DISSECTOR  3.8MM X 13CM (MISCELLANEOUS)
DISSECTOR 3.8MM X 13CM (MISCELLANEOUS) IMPLANT
DRAPE ORTHO SPLIT 77X108 STRL (DRAPES) ×2
DRAPE POUCH INSTRU U-SHP 10X18 (DRAPES) ×2 IMPLANT
DRAPE SHEET LG 3/4 BI-LAMINATE (DRAPES) ×2 IMPLANT
DRAPE STERI 35X30 U-POUCH (DRAPES) ×2 IMPLANT
DRAPE SURG 17X23 STRL (DRAPES) ×2 IMPLANT
DRAPE SURG ORHT 6 SPLT 77X108 (DRAPES) ×2 IMPLANT
DRAPE U-SHAPE 47X51 STRL (DRAPES) ×2 IMPLANT
DRSG PAD ABDOMINAL 8X10 ST (GAUZE/BANDAGES/DRESSINGS) ×6 IMPLANT
DURAPREP 26ML APPLICATOR (WOUND CARE) ×2 IMPLANT
ELECT MENISCUS 165MM 90D (ELECTRODE) IMPLANT
ELECT REM PT RETURN 15FT ADLT (MISCELLANEOUS) IMPLANT
FIBERSTICK 2 (SUTURE) IMPLANT
GAUZE SPONGE 4X4 12PLY STRL (GAUZE/BANDAGES/DRESSINGS) ×2 IMPLANT
GAUZE XEROFORM 1X8 LF (GAUZE/BANDAGES/DRESSINGS) IMPLANT
GLOVE SRG 8 PF TXTR STRL LF DI (GLOVE) ×1 IMPLANT
GLOVE SURG ENC MOIS LTX SZ7.5 (GLOVE) ×2 IMPLANT
GLOVE SURG UNDER POLY LF SZ8 (GLOVE) ×1
GOWN STRL REUS W/TWL LRG LVL3 (GOWN DISPOSABLE) ×2 IMPLANT
IV NS IRRIG 3000ML ARTHROMATIC (IV SOLUTION) ×4 IMPLANT
KIT BASIN OR (CUSTOM PROCEDURE TRAY) ×2 IMPLANT
KIT TURNOVER KIT A (KITS) ×2 IMPLANT
LASSO SUT 90 DEGREE (SUTURE) IMPLANT
LOOP 2 FIBERLINK CLOSED (SUTURE) IMPLANT
MANIFOLD NEPTUNE II (INSTRUMENTS) ×2 IMPLANT
NDL SAFETY ECLIPSE 18X1.5 (NEEDLE) IMPLANT
NEEDLE 1/2 CIR CATGUT .05X1.09 (NEEDLE) IMPLANT
NEEDLE FILTER BLUNT 18X 1/2SAF (NEEDLE) ×1
NEEDLE FILTER BLUNT 18X1 1/2 (NEEDLE) ×1 IMPLANT
NEEDLE HYPO 18GX1.5 SHARP (NEEDLE)
NEEDLE SCORPION MULTI FIRE (NEEDLE) ×2 IMPLANT
PACK ARTHROSCOPY WL (CUSTOM PROCEDURE TRAY) ×2 IMPLANT
PAD ARMBOARD 7.5X6 YLW CONV (MISCELLANEOUS) IMPLANT
PENCIL SMOKE EVACUATOR (MISCELLANEOUS) IMPLANT
PROBE APOLLO 90XL (SURGICAL WAND) ×2 IMPLANT
PROBE BIPOLAR ATHRO 135MM 90D (MISCELLANEOUS) ×2 IMPLANT
SLEEVE ARM SUSPENSION SYSTEM (MISCELLANEOUS) ×2 IMPLANT
SLING S3 LATERAL DISP (MISCELLANEOUS) ×2 IMPLANT
SLING ULTRA II AB L (ORTHOPEDIC SUPPLIES) IMPLANT
SPONGE T-LAP 4X18 ~~LOC~~+RFID (SPONGE) IMPLANT
STRIP CLOSURE SKIN 1/2X4 (GAUZE/BANDAGES/DRESSINGS) ×2 IMPLANT
SUCTION FRAZIER HANDLE 10FR (MISCELLANEOUS)
SUCTION TUBE FRAZIER 10FR DISP (MISCELLANEOUS) IMPLANT
SUT 2 FIBERLOOP 20 STRT BLUE (SUTURE)
SUT ETHILON 3 0 PS 1 (SUTURE) IMPLANT
SUT FIBERWIRE #2 38 T-5 BLUE (SUTURE)
SUT LASSO 45 DEGREE LEFT (SUTURE) IMPLANT
SUT LASSO 45D RIGHT (SUTURE) IMPLANT
SUT MNCRL AB 3-0 PS2 27 (SUTURE) ×2 IMPLANT
SUT PDS AB 0 CT1 36 (SUTURE) IMPLANT
SUT TIGER TAPE 7 IN WHITE (SUTURE) IMPLANT
SUT VIC AB 0 CT1 36 (SUTURE) IMPLANT
SUT VIC AB 2-0 CT1 27 (SUTURE)
SUT VIC AB 2-0 CT1 TAPERPNT 27 (SUTURE) IMPLANT
SUTURE 2 FIBERLOOP 20 STRT BLU (SUTURE) IMPLANT
SUTURE FIBERWR #2 38 T-5 BLUE (SUTURE) IMPLANT
SYR 27GX1/2 1ML LL SAFETY (SYRINGE) IMPLANT
SYR CONTROL 10ML LL (SYRINGE) IMPLANT
TAPE CLOTH SURG 6X10 WHT LF (GAUZE/BANDAGES/DRESSINGS) ×2 IMPLANT
TOWEL OR 17X26 10 PK STRL BLUE (TOWEL DISPOSABLE) ×4 IMPLANT
TUBING ARTHROSCOPY IRRIG 16FT (MISCELLANEOUS) ×4 IMPLANT
TUBING CONNECTING 10 (TUBING) ×4 IMPLANT
WATER STERILE IRR 500ML POUR (IV SOLUTION) ×2 IMPLANT
YANKAUER SUCT BULB TIP NO VENT (SUCTIONS) IMPLANT

## 2021-08-04 NOTE — Anesthesia Procedure Notes (Signed)
Anesthesia Regional Block: Interscalene brachial plexus block   Pre-Anesthetic Checklist: , timeout performed,  Correct Patient, Correct Site, Correct Laterality,  Correct Procedure, Correct Position, site marked,  Risks and benefits discussed,  Pre-op evaluation,  At surgeon's request and post-op pain management  Laterality: Right  Prep: Maximum Sterile Barrier Precautions used, chloraprep       Needles:  Injection technique: Single-shot  Needle Type: Echogenic Stimulator Needle     Needle Length: 9cm  Needle Gauge: 22     Additional Needles:   Procedures:,,,, ultrasound used (permanent image in chart),,    Narrative:  Start time: 08/04/2021 1:50 PM End time: 08/04/2021 1:53 PM Injection made incrementally with aspirations every 5 mL.  Performed by: Personally  Anesthesiologist: Brennan Bailey, MD  Additional Notes: Risks, benefits, and alternative discussed. Patient gave consent for procedure. Patient prepped and draped in sterile fashion. Sedation administered, patient remains easily responsive to voice. Relevant anatomy identified with ultrasound guidance. Local anesthetic given in 5cc increments with no signs or symptoms of intravascular injection. No pain or paraesthesias with injection. Patient monitored throughout procedure with signs of LAST or immediate complications. Tolerated well. Ultrasound image placed in chart.  Tawny Asal, MD

## 2021-08-04 NOTE — Anesthesia Postprocedure Evaluation (Signed)
Anesthesia Post Note  Patient: Lawrence Rogers  Procedure(s) Performed: SHOULDER ARTHROSCOPY WITH ROTATOR CUFF REPAIR,EXTENSIVE DEBRIDEMENT, SUBACROMIAL DECOMPRESSION,DISTAL CLAVICLE RESCTION (Right)     Patient location during evaluation: PACU Anesthesia Type: General Level of consciousness: awake and alert and oriented Pain management: pain level controlled Vital Signs Assessment: post-procedure vital signs reviewed and stable Respiratory status: spontaneous breathing, nonlabored ventilation and respiratory function stable Cardiovascular status: blood pressure returned to baseline Postop Assessment: no apparent nausea or vomiting Anesthetic complications: no   No notable events documented.  Last Vitals:  Vitals:   08/04/21 1700 08/04/21 1708  BP: 133/67 134/62  Pulse: 87 85  Resp: 16 16  Temp: 36.4 C 36.4 C  SpO2: 93% 92%    Last Pain:  Vitals:   08/04/21 1708  TempSrc:   PainSc: 0-No pain                 Marthenia Rolling

## 2021-08-04 NOTE — H&P (Signed)
ORTHOPAEDIC H&P  REQUESTING PHYSICIAN: Nicholes Stairs, MD  PCP:  [name], MD  Chief Complaint: Right shoulder pain and weakness  HPI: Lawrence Rogers is a 60 y.o. male who complains of right shoulder pain and weakness that has been recalcitrant to conservative treatment.  He is here today for arthroscopic repair of his rotator cuff with debridement and distal clavicle resection.  Past Medical History:  Diagnosis Date   Arthritis    Cirrhosis ([place])    Depression    [facility])    Past Surgical History:  Procedure Laterality Date   CATARACT EXTRACTION W/PHACO Left 10/26/2019   Procedure: CATARACT EXTRACTION PHACO AND INTRAOCULAR LENS PLACEMENT LEFT EYE CDE=5.20;  Surgeon: Baruch Goldmann, MD;  Location: [facility];  Service: Ophthalmology;  Laterality: Left;  left   CATARACT EXTRACTION W/PHACO Right 11/09/2019   Procedure: CATARACT EXTRACTION PHACO AND INTRAOCULAR LENS PLACEMENT (IOC);  Surgeon: Baruch Goldmann, MD;  Location: [facility];  Service: Ophthalmology;  Laterality: Right;  CDE: 3.03   COLONOSCOPY WITH PROPOFOL N/A 12/04/2019   Procedure: COLONOSCOPY WITH PROPOFOL;  Surgeon: Rogene Houston, MD;  [facility]: [facility];  Service: Endoscopy;  Laterality: N/A;   ESOPHAGEAL BANDING N/A 08/29/2018   Procedure: ESOPHAGEAL BANDING;  Surgeon: Rogene Houston, MD;  [facility];  Service: Endoscopy;  Laterality: N/A;   ESOPHAGEAL BANDING  10/24/2018   Procedure: ESOPHAGEAL BANDING;  Surgeon: Rogene Houston, MD;  [facility];  Service: Endoscopy;;  3 bands   ESOPHAGEAL BANDING  12/04/2019   Procedure: ESOPHAGEAL BANDING;  Surgeon: Rogene Houston, MD;  [facility];  Service: Endoscopy;;   ESOPHAGOGASTRODUODENOSCOPY (EGD) WITH PROPOFOL N/A 08/29/2018   Procedure: ESOPHAGOGASTRODUODENOSCOPY (EGD) WITH PROPOFOL;  Surgeon: Rogene Houston, MD;  [facility];  Service: Endoscopy;  Laterality: N/A;  9:30    ESOPHAGOGASTRODUODENOSCOPY (EGD) WITH PROPOFOL N/A 10/24/2018   Procedure: ESOPHAGOGASTRODUODENOSCOPY (EGD) WITH PROPOFOL;  Surgeon: Rogene Houston, MD;  [facility];  Service: Endoscopy;  Laterality: N/A;  7:30   ESOPHAGOGASTRODUODENOSCOPY (EGD) WITH PROPOFOL N/A 12/04/2019   Procedure: ESOPHAGOGASTRODUODENOSCOPY (EGD) WITH PROPOFOL;  Surgeon: Rogene Houston, MD;  [facility];  Service: Endoscopy;  Laterality: N/A;   IR PARACENTESIS  04/18/2018   IR TRANSCATHETER BX  04/18/2018   IR US GUIDE VASC ACCESS RIGHT  04/18/2018   IR VENOGRAM HEPATIC W HEMODYNAMIC EVALUATION  04/18/2018   PLEURAL EFFUSION DRAINAGE Right 07/22/2019   Procedure: Drainage Of Pleural Effusion;  Surgeon: Lajuana Matte, MD;  Location: [place];  Service: Thoracic;  Laterality: Right;   POLYPECTOMY  12/04/2019   Procedure: POLYPECTOMY;  Surgeon: Rogene Houston, MD;  [facility];  Service: Endoscopy;;  hepatic flexure, cecal, proximal transverse colon   VIDEO ASSISTED THORACOSCOPY (VATS)/DECORTICATION Right 07/22/2019   Procedure: VIDEO ASSISTED THORACOSCOPY (VATS)/DECORTICATION;  Surgeon: Lajuana Matte, MD;  Location: [place];  Service: Thoracic;  Laterality: Right;   Social History   Socioeconomic History   Marital status: Single    Spouse name: Not on file   Number of children: Not on file   Years of education: Not on file   Highest education level: Not on file  Occupational History   Not on file  Tobacco Use   Smoking status: Former    Packs/day: 0.25    Years: 15.00    Pack years: 3.75    Types: Cigarettes    Quit date: 11/30/1994  Years since quitting: 26.6   Smokeless tobacco: Never  Vaping Use   Vaping Use: Never used  Substance and Sexual Activity   Alcohol use: Never    Comment: Has not drank in 30 yrs.    Drug use: Never   Sexual activity: Not Currently  Other Topics Concern   Not on file  Social History Narrative   Not on file   Social Determinants of  Health   Financial Resource Strain: Not on file  Food Insecurity: Not on file  Transportation Needs: Not on file  Physical Activity: Not on file  Stress: Not on file  Social Connections: Not on file   History reviewed. No pertinent family history. No Known Allergies Prior to Admission medications   Medication Sig Start Date End Date Taking? Authorizing Provider  atorvastatin (LIPITOR) 20 MG tablet Take 20 mg by mouth daily.   Yes [provider]  bumetanide (BUMEX) 2 MG tablet Take 2 mg by mouth daily. 07/19/21  Yes [provider]  cholecalciferol (VITAMIN D3) 25 MCG (1000 UNIT) tablet Take 1,000 Units by mouth daily.   Yes [provider]  ferrous sulfate 325 (65 FE) MG tablet Take 1 tablet (325 mg total) by mouth daily with breakfast. 10/25/19  Yes Rehman, Mechele Dawley, MD  glimepiride (AMARYL) 2 MG tablet Take 2 mg by mouth daily. 10/09/19  Yes [provider]  metFORMIN (GLUCOPHAGE-XR) 500 MG 24 hr tablet Take 2 tablets (1,000 mg total) by mouth 2 (two) times daily. 12/05/19  Yes Rehman, Mechele Dawley, MD  pantoprazole (PROTONIX) 40 MG tablet Take 1 tablet (40 mg total) by mouth 2 (two) times daily. TAKE ONE TABLET (40MG TOTAL) BY MOUTH DAILY BEFORE BREAKFAST Patient taking differently: Take 40 mg by mouth daily before breakfast. 03/10/20  Yes Rehman, Mechele Dawley, MD  spironolactone (ALDACTONE) 100 MG tablet Take 2 tablets (200 mg total) by mouth daily. 07/30/19  Yes Aline August, MD  traMADol (ULTRAM) 50 MG tablet Take 50 mg by mouth 3 (three) times daily as needed for moderate pain.   Yes [provider]  triamcinolone cream (KENALOG) 0.5 % Apply 1 application topically daily as needed (rash).   Yes [provider]   No results found.  Positive ROS: All other systems have been reviewed and were otherwise negative with the exception of those mentioned in the HPI and as above.  Physical Exam: General: Alert, no acute distress Cardiovascular:  No pedal edema Respiratory: No cyanosis, no use of accessory musculature GI: No organomegaly, abdomen is soft and non-tender Skin: No lesions in the area of chief complaint Neurologic: Sensation intact distally Psychiatric: Patient is competent for consent with normal mood and affect Lymphatic: No axillary or cervical lymphadenopathy  MUSCULOSKELETAL:  Right upper extremity is warm well perfused with no open wounds or lesions.  Neurovascularly intact.  Assessment: 1.  Right shoulder rotator cuff tear complete.  2.  Right shoulder degenerative labral tearing anterior and posterior as well as superior.  3.  Right shoulder subacromial impingement  4.  Right shoulder acromioclavicular joint arthropathy  Plan: -Plan to proceed today with arthroscopic management of the right shoulder.  We discussed arthroscopic rotator cuff repair with extensive debridement, subacromial decompression, and distal clavicle resection to address all of his pain sources.  We reviewed the risk of bleeding, infection, damage to surrounding nerves and vessels, stiffness, failure of repair, need for further surgery, risk of anesthesia, as well as risk of DVT.  He has provided informed consent.  -  Plan for discharge home postoperatively from PACU.    Nicholes Stairs, MD Cell [phone]    08/04/2021 1:52 PM

## 2021-08-04 NOTE — Progress Notes (Signed)
Assisted Dr. Doloris Hall with Right Interscalene Brachial Plexus Block. Side rails up, monitors on throughout procedure. See vital signs in flow sheet. Tolerated Procedure well.

## 2021-08-04 NOTE — Anesthesia Procedure Notes (Signed)
Procedure Name: Intubation Date/Time: 08/04/2021 2:36 PM Performed by: Cleda Daub, CRNA Pre-anesthesia Checklist: Patient identified, Emergency Drugs available, Suction available and Patient being monitored Patient Re-evaluated:Patient Re-evaluated prior to induction Oxygen Delivery Method: Circle system utilized Preoxygenation: Pre-oxygenation with 100% oxygen Induction Type: IV induction Ventilation: Mask ventilation without difficulty Laryngoscope Size: Mac and 4 Grade View: Grade I Tube type: Oral Tube size: 7.5 mm Number of attempts: 1 Airway Equipment and Method: Stylet and Oral airway Placement Confirmation: ETT inserted through vocal cords under direct vision, positive ETCO2 and breath sounds checked- equal and bilateral Secured at: 23 cm Tube secured with: Tape Dental Injury: Teeth and Oropharynx as per pre-operative assessment

## 2021-08-04 NOTE — Brief Op Note (Signed)
08/04/2021  3:50 PM  PATIENT:  Lawrence Rogers  60 y.o. male  PRE-OPERATIVE DIAGNOSIS:  RIght Shoulder rotator cuff tear  POST-OPERATIVE DIAGNOSIS:  RIght Shoulder rotator cuff tear, impingement, and labrum tears  PROCEDURE:  Procedure(s) with comments: SHOULDER ARTHROSCOPY WITH ROTATOR CUFF REPAIR,EXTENSIVE DEBRIDEMENT, SUBACROMIAL DECOMPRESSION (Right) - 153mn  SURGEON:  Surgeon(s) and Role:    * RStann Mainland JElly Modena MD - Primary  PHYSICIAN ASSISTANT: KJonelle Sidle PA-C  ANESTHESIA:   regional and general  EBL:  75 mL   BLOOD ADMINISTERED:none  DRAINS: none   LOCAL MEDICATIONS USED:  NONE  SPECIMEN:  No Specimen  DISPOSITION OF SPECIMEN:  N/A  COUNTS:  YES  TOURNIQUET:  * No tourniquets in log *  DICTATION: .Note written in EPIC  PLAN OF CARE: Discharge to home after PACU  PATIENT DISPOSITION:  PACU - hemodynamically stable.   Delay start of Pharmacological VTE agent (>24hrs) due to surgical blood loss or risk of bleeding: not applicable

## 2021-08-04 NOTE — Discharge Instructions (Signed)
Discharge instructions:  -Maintain right arm in sling at all times.  He may remove for showering and activities of daily living, but no lifting with the right arm.  Maintain your postoperative bandage for 3 days.  The third day you may remove bandage and begin showering.  Please do not submerge underwater.  -No lifting with the right arm.  -Apply ice to the right shoulder for 20 to 30 minutes out of each hour that you are able and awake around-the-clock.  -For mild to moderate pain use Tylenol and Advil in alternating fashion and around-the-clock.  For breakthrough pain use oxycodone as necessary.  Can also use Robaxin for muscle relaxation.  -Return to see Dr. Stann Mainland in 2 weeks for routine postoperative check.

## 2021-08-04 NOTE — Op Note (Signed)
08/04/2021   PATIENT:  Lawrence Rogers    PRE-OPERATIVE DIAGNOSIS:   Right shoulder complete rotator cuff tear Right shoulder subacromial impingement Right shoulder degenerative labral tearing Right shoulder acromioclavicular joint arthropathy  POST-OPERATIVE DIAGNOSIS:  Same  PROCEDURE:   1.  Right shoulder arthroscopic rotator cuff repair 2. Right shoulder arthroscopic extensive debridement of labrum, anterior, superior, and posterior labrum, and rotator interval as well as glenoid fossa 3.  Right shoulder arthroscopic subacromial decompression  SURGEON:  Nicholes Stairs, MD  PHYSICIAN ASSISTANT: Jonelle Sidle, PA-C  Assistant attestation: PA Mcclung was present for the entire procedure.  Participated in all critical portions.  ANESTHESIA:   General  ESTIMATED BLOOD LOSS: 10 cc  PREOPERATIVE INDICATIONS:  [name] is a  60 y.o. male with a diagnosis of RIght Shoulder rotator cuff tear who failed conservative measures and elected for surgical management.    The risks benefits and alternatives were discussed with the patient preoperatively including but not limited to the risks of infection, bleeding, nerve injury, cardiopulmonary complications, the need for revision surgery, among others, and the patient was willing to proceed.  OPERATIVE IMPLANTS:  Arthrex suture tape x2 Arthrex 4.75 mm swivel lock anchor x1 for single row repair  OPERATIVE FINDINGS:  Extensive degenerative tearing of the labrum noted on the intra-articular portion of the exam, this included anterior, superior, and posterior.  Flattening of the proximal biceps but no real tendinitis noted.  Moderate inflammation and thickening noted of the anterior rotator interval.  Full-thickness tear noted of the supraspinatus and anterior portion a crescent pattern measuring 1.5 cm x 1.0 cm in the medial to lateral dimension.  Intact rotator cuff musculature of the subscapularis, infraspinatus, and teres  minor.  In the subacromial space there was the previously mentioned rotator cuff pathology as well as moderate subacromial bursitis and a large medial acromial spur creating mass-effect on the anterior rotator cuff muscle tendon junction.  OPERATIVE PROCEDURE: The patient was brought to the operating room and placed in the supine position. General anesthesia was administered. IV antibiotics were given. General anesthesia was administered.   The upper extremity was examined and found to be grossly unstable particularly to anterior testing. The upper extremity was prepped and draped in the usual sterile fashion. The patient was in a semilateral decubitus position.  Time out was performed. Diagnostic arthroscopy was carried out the above-named findings.   We established a posterior lateral viewing portal 2 cm distant and 1 cm medial to the posterior lateral corner of the acromion.  Following that we established a mid glenoid working portal via spinal needle localization in the anterior rotator interval.  While viewing posterior working anterior we performed a wide and extensive debridement of the intra-articular joint.  This included the rotator interval with motorized shaver, anterior, superior, and posterior labrum with motorized shaver as well as tenotomized long head of the biceps tendon.  Next, we moved to the subacromial space to perform the rotator cuff repair.  We identified a crescent-shaped tear of the supraspinatus.  This measured 1.5 cm from anterior to posterior and 1.0 cm from medial to lateral.  Tendon was mobilized with the motorized shaver utilizing complete bursectomy as well as radiofrequency wand to mobilize tendon.  It was easily able to be reduced to the lateral row.  We then placed 2 horizontal mattress sutures with the Arthrex suture tape suture.  Next, these were loaded into a 4.75 mm swivel lock anchor.  The greater tuberosity was  biologically prepped with motorized bur.  Anchor was  then placed lateral footprint.  Excellent tendon to bone fixation.  We then moved our attention to the subacromial decompression.  Utilizing radiofrequency ablation device we subperiosteally dissected the anterior lateral corner of the acromion and then moved medial we found a large type II hooked acromion.  This was creating a mass-effect on the anterior rotator cuff musculotendinous junction.  We then used a cutting block technique while viewing from the lateral portal and working from the posterior portal to resect the hooked acromion back to a type II morphology.  The coracoacromial ligament was released.  and the arthroscopic cannulas were removed, and the portals closed with Monocryl followed by Steri-Strips and sterile gauze. The patient was awakened and returned to the PACU in stable and satisfactory condition. There were no complications and the patient tolerated the procedure well.  All counts were correct.  Disposition:  The patient will be nonweightbearing with an abduction sling to the operative extremity.  He may begin scapular retractions and elbow hand and wrist range motion as tolerated.  He will begin physical therapy in 1 week.  I will see them back in the office in 2 weeks for a wound check.

## 2021-08-04 NOTE — Anesthesia Preprocedure Evaluation (Addendum)
Anesthesia Evaluation  Patient identified by MRN, date of birth, ID band Patient awake    Reviewed: Allergy & Precautions, NPO status , Patient's Chart, lab work & pertinent test results  History of Anesthesia Complications Negative for: history of anesthetic complications  Airway Mallampati: II  TM Distance: >3 FB Neck ROM: Full    Dental  (+) Edentulous Upper, Edentulous Lower   Pulmonary former smoker,    Pulmonary exam normal        Cardiovascular Normal cardiovascular exam  Echo 10/21/2019: EF 60-65%, mild LAE, right pleural effusion noted   Neuro/Psych Depression    GI/Hepatic PUD, (+) Cirrhosis       ,   Endo/Other  diabetes, Type 2  Renal/GU negative Renal ROS  negative genitourinary   Musculoskeletal  (+) Arthritis ,   Abdominal   Peds  Hematology Plts 97k   Anesthesia Other Findings Day of surgery medications reviewed with patient.  Reproductive/Obstetrics negative OB ROS                            Anesthesia Physical Anesthesia Plan  ASA: 3  Anesthesia Plan: General   Post-op Pain Management: GA combined w/ Regional for post-op pain   Induction: Intravenous  PONV Risk Score and Plan: 2 and Treatment may vary due to age or medical condition, Ondansetron, Dexamethasone and Midazolam  Airway Management Planned: Oral ETT  Additional Equipment: None  Intra-op Plan:   Post-operative Plan: Extubation in OR  Informed Consent: I have reviewed the patients History and Physical, chart, labs and discussed the procedure including the risks, benefits and alternatives for the proposed anesthesia with the patient or authorized representative who has indicated his/her understanding and acceptance.     Dental advisory given  Plan Discussed with: CRNA  Anesthesia Plan Comments:        Anesthesia Quick Evaluation

## 2021-08-04 NOTE — Transfer of Care (Signed)
Immediate Anesthesia Transfer of Care Note  Patient: Lawrence Rogers  Procedure(s) Performed: SHOULDER ARTHROSCOPY WITH ROTATOR CUFF REPAIR,EXTENSIVE DEBRIDEMENT, SUBACROMIAL DECOMPRESSION,DISTAL CLAVICLE RESCTION (Right)  Patient Location: PACU  Anesthesia Type:GA combined with regional for post-op pain  Level of Consciousness: awake, alert , oriented and patient cooperative  Airway & Oxygen Therapy: Patient Spontanous Breathing and Patient connected to face mask oxygen  Post-op Assessment: Report given to RN and Post -op Vital signs reviewed and stable  Post vital signs: Reviewed and stable  Last Vitals:  Vitals Value Taken Time  BP 123/62 08/04/21 1605  Temp    Pulse 90 08/04/21 1609  Resp 16 08/04/21 1609  SpO2 95 % 08/04/21 1609  Vitals shown include unvalidated device data.  Last Pain:  Vitals:   08/04/21 1221  TempSrc:   PainSc: 5       Patients Stated Pain Goal: 5 (33/61/22 4497)  Complications: No notable events documented.

## 2021-08-07 ENCOUNTER — Encounter (HOSPITAL_COMMUNITY): Payer: Self-pay | Admitting: Orthopedic Surgery

## 2021-08-11 ENCOUNTER — Ambulatory Visit (HOSPITAL_COMMUNITY): Payer: No Typology Code available for payment source | Attending: Orthopedic Surgery | Admitting: Occupational Therapy

## 2021-08-11 ENCOUNTER — Encounter (HOSPITAL_COMMUNITY): Payer: Self-pay | Admitting: Occupational Therapy

## 2021-08-11 ENCOUNTER — Other Ambulatory Visit: Payer: Self-pay

## 2021-08-11 DIAGNOSIS — M25511 Pain in right shoulder: Secondary | ICD-10-CM | POA: Insufficient documentation

## 2021-08-11 DIAGNOSIS — R29898 Other symptoms and signs involving the musculoskeletal system: Secondary | ICD-10-CM | POA: Insufficient documentation

## 2021-08-11 DIAGNOSIS — M25611 Stiffness of right shoulder, not elsewhere classified: Secondary | ICD-10-CM | POA: Insufficient documentation

## 2021-08-11 NOTE — Therapy (Signed)
[facility] [address] [place], [place], [address] Phone: [phone]   Fax:  [phone]  Occupational Therapy Evaluation  Patient Details  Name: Lawrence Rogers MRN: [id number] Date of Birth: [birth year] Referring Provider (OT): Dr. Victorino December   Encounter Date: 08/11/2021   OT End of Session - 08/11/21 1141     Visit Number 1    Number of Visits 16    Date for OT Re-Evaluation 10/10/21   09/09/2021 mini-reassessment   Authorization Type Worker's Comp    Authorization Time Period 12 visits approved    Authorization - Visit Number 1    Authorization - Number of Visits 12    OT Start Time 1034    OT Stop Time 1110    OT Time Calculation (min) 36 min    Activity Tolerance Patient tolerated treatment well    Behavior During Therapy [facility] for tasks assessed/performed             Past Medical History:  Diagnosis Date   Arthritis    Cirrhosis ([place])    Depression    Diabetes ([place])     Past Surgical History:  Procedure Laterality Date   CATARACT EXTRACTION W/PHACO Left 10/26/2019   Procedure: CATARACT EXTRACTION PHACO AND INTRAOCULAR LENS PLACEMENT LEFT EYE CDE=5.20;  Surgeon: Baruch Goldmann, MD;  [facility];  Service: Ophthalmology;  Laterality: Left;  left   CATARACT EXTRACTION W/PHACO Right 11/09/2019   Procedure: CATARACT EXTRACTION PHACO AND INTRAOCULAR LENS PLACEMENT (IOC);  Surgeon: Baruch Goldmann, MD;  [facility]: [facility];  Service: Ophthalmology;  Laterality: Right;  CDE: 3.03   COLONOSCOPY WITH PROPOFOL N/A 12/04/2019   Procedure: COLONOSCOPY WITH PROPOFOL;  Surgeon: Rogene Houston, MD;  [facility];  Service: Endoscopy;  Laterality: N/A;   ESOPHAGEAL BANDING N/A 08/29/2018   Procedure: ESOPHAGEAL BANDING;  Surgeon: Rogene Houston, MD;  [facility];  Service: Endoscopy;  Laterality: N/A;   ESOPHAGEAL BANDING  10/24/2018   Procedure: ESOPHAGEAL BANDING;  Surgeon: Rogene Houston, MD;   [facility];  Service: Endoscopy;;  3 bands   ESOPHAGEAL BANDING  12/04/2019   Procedure: ESOPHAGEAL BANDING;  Surgeon: Rogene Houston, MD;  [facility];  Service: Endoscopy;;   ESOPHAGOGASTRODUODENOSCOPY (EGD) WITH PROPOFOL N/A 08/29/2018   Procedure: ESOPHAGOGASTRODUODENOSCOPY (EGD) WITH PROPOFOL;  Surgeon: Rogene Houston, MD;  [facility];  Service: Endoscopy;  Laterality: N/A;  9:30   ESOPHAGOGASTRODUODENOSCOPY (EGD) WITH PROPOFOL N/A 10/24/2018   Procedure: ESOPHAGOGASTRODUODENOSCOPY (EGD) WITH PROPOFOL;  Surgeon: Rogene Houston, MD;  [facility];  Service: Endoscopy;  Laterality: N/A;  7:30   ESOPHAGOGASTRODUODENOSCOPY (EGD) WITH PROPOFOL N/A 12/04/2019   Procedure: ESOPHAGOGASTRODUODENOSCOPY (EGD) WITH PROPOFOL;  Surgeon: Rogene Houston, MD;  [facility];  Service: Endoscopy;  Laterality: N/A;   IR PARACENTESIS  04/18/2018   IR TRANSCATHETER BX  04/18/2018   IR US GUIDE VASC ACCESS RIGHT  04/18/2018   IR VENOGRAM HEPATIC W HEMODYNAMIC EVALUATION  04/18/2018   PLEURAL EFFUSION DRAINAGE Right 07/22/2019   Procedure: Drainage Of Pleural Effusion;  Surgeon: Lajuana Matte, MD;  Location: [facility];  Service: Thoracic;  Laterality: Right;   POLYPECTOMY  12/04/2019   Procedure: POLYPECTOMY;  Surgeon: Rogene Houston, MD;  [facility];  Service: Endoscopy;;  hepatic flexure, cecal, proximal transverse colon   SHOULDER ARTHROSCOPY WITH ROTATOR CUFF REPAIR Right 08/04/2021   Procedure: SHOULDER ARTHROSCOPY WITH ROTATOR  CUFF REPAIR,EXTENSIVE DEBRIDEMENT, SUBACROMIAL DECOMPRESSION,DISTAL CLAVICLE RESCTION;  Surgeon: Nicholes Stairs, MD;  [facility];  Service: Orthopedics;  Laterality: Right;  185mn   VIDEO ASSISTED THORACOSCOPY (VATS)/DECORTICATION Right 07/22/2019   Procedure: VIDEO ASSISTED THORACOSCOPY (VATS)/DECORTICATION;  Surgeon: LLajuana Matte MD;  [facility]  Service: Thoracic;  Laterality: Right;     There were no vitals filed for this visit.   Subjective Assessment - 08/11/21 1056     Subjective  S: I fell at work.    Pertinent History Pt is a 60y/o male s/p right RCR, extensive debridement, SAD, and DCR on 08/04/21. Pt sustained RC tear after a fall at work. Pt presents in abduction sling. Pt was referred to occupational therapy by Dr. JVictorino Decemberfor evaluation and treatment.    Special Tests FOTO: 28/100    Patient Stated Goals To be able to use my arm and go back to work.    Currently in Pain? Yes    Pain Score 3     Pain Location Shoulder    Pain Orientation Right    Pain Descriptors / Indicators Aching;Sore;Throbbing    Pain Type Acute pain    Pain Radiating Towards elbow    Pain Onset In the past 7 days    Pain Frequency Intermittent    Aggravating Factors  movement    Pain Relieving Factors rest, pain medication, ice    Effect of Pain on Daily Activities unable to use RUE for ADLs    Multiple Pain Sites No               [facility] OT Assessment - 08/11/21 1031       Assessment   Medical Diagnosis s/p right RCR, extensive debridement, SAD, DCR    Referring Provider (OT) Dr. JVictorino December   Onset Date/Surgical Date 08/04/21    Hand Dominance Right    Next MD Visit 08/14/21    Prior Therapy None      Precautions   Precautions Shoulder    Type of Shoulder Precautions Waiting on protocol from MD office.    Shoulder Interventions Shoulder sling/immobilizer;Shoulder abduction pillow;Off for dressing/bathing/exercises      Balance Screen   Has the patient fallen in the past 6 months Yes    How many times? 1    Has the patient had a decrease in activity level because of a fear of falling?  No    Is the patient reluctant to leave their home because of a fear of falling?  No      Prior Function   Level of Independence Independent    Vocation Full time employment    Vocation Requirements Inspector-rotate tires, overhead reaching lifting/pulling, stitching rubber  into tires    Leisure antiques      ADL   ADL comments Pt is unable to use RUE for any ADLs or work tasks. Sleeping is difficult.      Written Expression   Dominant Hand Right      Cognition   Overall Cognitive Status Within Functional Limits for tasks assessed      Observation/Other Assessments   Focus on Therapeutic Outcomes (FOTO)  28/100      ROM / Strength   AROM / PROM / Strength AROM;PROM;Strength      Palpation   Palpation comment Pt with mod fascial restrictons along right upper arm, trapezius, and scapular regions      AROM   Overall AROM  Deficits;Due to precautions;Due to pain;Unable  to assess      PROM   Overall PROM Comments Assessed supine, er/IR adducted    PROM Assessment Site Shoulder    Right/Left Shoulder Right    Right Shoulder Flexion 84 Degrees    Right Shoulder ABduction 82 Degrees    Right Shoulder Internal Rotation 90 Degrees    Right Shoulder External Rotation -3 Degrees      Strength   Overall Strength Due to precautions;Due to pain;Unable to assess;Deficits                             OT Education - 08/11/21 1055     Education Details table slides    Person(s) Educated Patient    Methods Explanation;Demonstration;Handout    Comprehension Verbalized understanding;Returned demonstration              OT Short Term Goals - 08/11/21 1159       OT SHORT TERM GOAL #1   Title Pt will be provided with and educated on HEP to improve mobility required to complete ADLs.    Time 4    Period Weeks    Status New    Target Date 09/10/21      OT SHORT TERM GOAL #2   Title Pt will increase P/ROM in RUE to [facility] to improve ability to perform dressing tasks with less compensatory strategies.    Time 4    Period Weeks    Status New      OT SHORT TERM GOAL #3   Title Pt will increase RUE strength to 3/5 to improve ability to reach items at waist to chest height.    Time 4    Period Weeks    Status New                OT Long Term Goals - 08/11/21 1256       OT LONG TERM GOAL #1   Title Pt will decrease pain in RUE to 3/10 or less to improve ability to sleep for 3+ hours without waking due to pain.    Time 8    Period Weeks    Status New    Target Date 10/10/21      OT LONG TERM GOAL #2   Title Pt will decrease RUE fascial restrictions to minimal amounts or less to improve ability to perform overhead reaching tasks.    Time 8    Period Weeks    Status New      OT LONG TERM GOAL #3   Title Pt will increase RUE A/ROM to [facility] to improve ability to perform dressing and bathing tasks.    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    Time 8    Period Weeks    Status New                   Plan - 08/11/21 1156     Clinical Impression Statement A: Pt is a 60 y/o male s/p right arthroscopic RCR, extensive debridement, SAD, and DCR presenting with functional limitations impacting ability to complete ADLs and work tasks using RUE as dominant.    OT Occupational Profile and History Problem Focused Assessment - Including review of records relating to  presenting problem    Occupational performance deficits (Please refer to evaluation for details): ADL's;IADL's;Rest and Sleep;Work;Leisure    Body Structure / Function / Physical Skills ADL;Endurance;Muscle spasms;UE functional use;Fascial restriction;Pain;ROM;IADL;Strength    Rehab Potential Good    Clinical Decision Making Limited treatment options, no task modification necessary    Comorbidities Affecting Occupational Performance: None    Modification or Assistance to Complete Evaluation  No modification of tasks or assist necessary to complete eval    OT Frequency 2x / week    OT Duration 8 weeks    OT Treatment/Interventions Self-care/ADL training;Ultrasound;DME and/or AE instruction;Patient/family  education;Passive range of motion;Cryotherapy;Electrical Stimulation;Moist Heat;Therapeutic exercise;Manual Therapy;Therapeutic activities    Plan P: Pt will benefit from skilled OT services to decrease pain and fascial restrictions, increase ROM, strength, and functional use of RUE as dominant. Treatment plan: myofascial release, manual techniques, P/ROM, AA/ROM, A/ROM, general RUE strengthening, scapular mobility/stability/strengthening, modalities prn    OT Home Exercise Plan eval: table slides    Consulted and Agree with Plan of Care Patient             Patient will benefit from skilled therapeutic intervention in order to improve the following deficits and impairments:   Body Structure / Function / Physical Skills: ADL, Endurance, Muscle spasms, UE functional use, Fascial restriction, Pain, ROM, IADL, Strength       Visit Diagnosis: Acute pain of right shoulder  Stiffness of right shoulder, not elsewhere classified  Other symptoms and signs involving the musculoskeletal system    Problem List Patient Active Problem List   Diagnosis Date Noted   Empyema lung ([place]) 07/20/2019   Acute respiratory failure with hypoxia ([place]) 07/20/2019   Empyema ([place]) 07/20/2019   Acute respiratory distress 07/19/2019   Pleural effusion 07/19/2019   Gastric ulcer 09/22/2018   Other cirrhosis of liver ([place]) 08/07/2018   Diabetes ([place])    Cirrhosis of liver with ascites [facility])     Guadelupe Sabin, OTR/L  [phone] 08/11/2021, 1:14 PM  [place], [place], [address] Phone: [phone]   Fax:  [phone]  Name: TRAVEION RUDDOCK MRN: [id number] Date of Birth: Mar 17, 1961

## 2021-08-11 NOTE — Patient Instructions (Signed)
1) SHOULDER: Flexion On Table   Place hands on towel placed on table, elbows straight. Lean forward with you upper body, pushing towel away from body.  __10_ reps per set, __3_ sets per day  2) Abduction (Passive)   With arm out to side, resting on towel placed on table with palm DOWN, keeping trunk away from table, lean to the side while pushing towel away from body.  Repeat __10__ times. Do __3__ sessions per day.  Copyright  VHI. All rights reserved.     3) Internal Rotation (Assistive)   Seated with elbow bent at right angle and held against side, slide arm on table surface in an inward arc keeping elbow anchored in place. Repeat __10__ times. Do ___3_ sessions per day. Activity: Use this motion to brush crumbs off the table.  Copyright  VHI. All rights reserved.

## 2021-08-16 ENCOUNTER — Other Ambulatory Visit: Payer: Self-pay

## 2021-08-16 ENCOUNTER — Encounter (HOSPITAL_COMMUNITY): Payer: Self-pay

## 2021-08-16 ENCOUNTER — Ambulatory Visit (HOSPITAL_COMMUNITY): Payer: No Typology Code available for payment source

## 2021-08-16 DIAGNOSIS — M25511 Pain in right shoulder: Secondary | ICD-10-CM

## 2021-08-16 DIAGNOSIS — M25611 Stiffness of right shoulder, not elsewhere classified: Secondary | ICD-10-CM

## 2021-08-16 DIAGNOSIS — R29898 Other symptoms and signs involving the musculoskeletal system: Secondary | ICD-10-CM

## 2021-08-16 NOTE — Therapy (Signed)
[facility] [address] [place], [place], [address] Phone: [phone]   Fax:  [phone]  Occupational Therapy Treatment  Patient Details  Name: Lawrence Rogers MRN: [id number] Date of Birth: August 11, 1961 Referring Provider (OT): Dr. Victorino December   Encounter Date: 08/16/2021   OT End of Session - 08/16/21 1433     Visit Number 2    Number of Visits 16    Date for OT Re-Evaluation 10/10/21   09/09/2021 mini-reassessment   Authorization Type Worker's Comp    Authorization Time Period 12 visits approved    Authorization - Visit Number 2    Authorization - Number of Visits 12    OT Start Time 1324    OT Stop Time 1423    OT Time Calculation (min) 38 min    Activity Tolerance Patient tolerated treatment well    Behavior During Therapy [facility] for tasks assessed/performed             Past Medical History:  Diagnosis Date   Arthritis    Cirrhosis ([place])    Depression    Diabetes ([place])     Past Surgical History:  Procedure Laterality Date   CATARACT EXTRACTION W/PHACO Left 10/26/2019   Procedure: CATARACT EXTRACTION PHACO AND INTRAOCULAR LENS PLACEMENT LEFT EYE CDE=5.20;  Surgeon: Baruch Goldmann, MD;  [facility];  Service: Ophthalmology;  Laterality: Left;  left   CATARACT EXTRACTION W/PHACO Right 11/09/2019   Procedure: CATARACT EXTRACTION PHACO AND INTRAOCULAR LENS PLACEMENT (IOC);  Surgeon: Baruch Goldmann, MD;  [facility];  Service: Ophthalmology;  Laterality: Right;  CDE: 3.03   COLONOSCOPY WITH PROPOFOL N/A 12/04/2019   Procedure: COLONOSCOPY WITH PROPOFOL;  Surgeon: Rogene Houston, MD;  [facility];  Service: Endoscopy;  Laterality: N/A;   ESOPHAGEAL BANDING N/A 08/29/2018   Procedure: ESOPHAGEAL BANDING;  Surgeon: Rogene Houston, MD;  [facility];  Service: Endoscopy;  Laterality: N/A;   ESOPHAGEAL BANDING  10/24/2018   Procedure: ESOPHAGEAL BANDING;  Surgeon: Rogene Houston, MD;   [facility];  Service: Endoscopy;;  3 bands   ESOPHAGEAL BANDING  12/04/2019   Procedure: ESOPHAGEAL BANDING;  Surgeon: Rogene Houston, MD;  [facility];  Service: Endoscopy;;   ESOPHAGOGASTRODUODENOSCOPY (EGD) WITH PROPOFOL N/A 08/29/2018   Procedure: ESOPHAGOGASTRODUODENOSCOPY (EGD) WITH PROPOFOL;  Surgeon: Rogene Houston, MD;  [facility];  Service: Endoscopy;  Laterality: N/A;  9:30   ESOPHAGOGASTRODUODENOSCOPY (EGD) WITH PROPOFOL N/A 10/24/2018   Procedure: ESOPHAGOGASTRODUODENOSCOPY (EGD) WITH PROPOFOL;  Surgeon: Rogene Houston, MD;  [facility];  Service: Endoscopy;  Laterality: N/A;  7:30   ESOPHAGOGASTRODUODENOSCOPY (EGD) WITH PROPOFOL N/A 12/04/2019   Procedure: ESOPHAGOGASTRODUODENOSCOPY (EGD) WITH PROPOFOL;  Surgeon: Rogene Houston, MD;  [facility];  Service: Endoscopy;  Laterality: N/A;   IR PARACENTESIS  04/18/2018   IR TRANSCATHETER BX  04/18/2018   IR US GUIDE VASC ACCESS RIGHT  04/18/2018   IR VENOGRAM HEPATIC W HEMODYNAMIC EVALUATION  04/18/2018   PLEURAL EFFUSION DRAINAGE Right 07/22/2019   Procedure: Drainage Of Pleural Effusion;  Surgeon: Lajuana Matte, MD;  Location: [facility];  Service: Thoracic;  Laterality: Right;   POLYPECTOMY  12/04/2019   Procedure: POLYPECTOMY;  Surgeon: Rogene Houston, MD;  [facility];  Service: Endoscopy;;  hepatic flexure, cecal, proximal transverse colon   SHOULDER ARTHROSCOPY WITH ROTATOR CUFF REPAIR Right 08/04/2021   Procedure: SHOULDER ARTHROSCOPY WITH ROTATOR  CUFF REPAIR,EXTENSIVE DEBRIDEMENT, SUBACROMIAL DECOMPRESSION,DISTAL CLAVICLE RESCTION;  Surgeon: Nicholes Stairs, MD;  [facility];  Service: Orthopedics;  Laterality: Right;  18mn   VIDEO ASSISTED THORACOSCOPY (VATS)/DECORTICATION Right 07/22/2019   Procedure: VIDEO ASSISTED THORACOSCOPY (VATS)/DECORTICATION;  Surgeon: LLajuana Matte MD;  [facility]  Service: Thoracic;  Laterality: Right;     There were no vitals filed for this visit.   Subjective Assessment - 08/16/21 1351     Currently in Pain? Yes    Pain Score 8     Pain Location Shoulder    Pain Orientation Right    Pain Descriptors / Indicators Aching    Pain Type Surgical pain    Pain Radiating Towards middle of scapula and posterior shoulder    Pain Onset In the past 7 days    Pain Frequency Intermittent    Aggravating Factors  movement,    Pain Relieving Factors pain medication, ice    Effect of Pain on Daily Activities unable to use RUE for ADLs.    Multiple Pain Sites No                [facility] OT Assessment - 08/16/21 1417       Assessment   Medical Diagnosis s/p right RCR, extensive debridement, SAD, DCR      Precautions   Precautions Shoulder    Type of Shoulder Precautions RTC protocol. See media tab    Shoulder Interventions Shoulder sling/immobilizer;Shoulder abduction pillow;Off for dressing/bathing/exercises                      OT Treatments/Exercises (OP) - 08/16/21 1418       Exercises   Exercises Shoulder      Shoulder Exercises: Supine   Protraction PROM;5 reps    Horizontal ABduction PROM    External Rotation PROM;5 reps    Internal Rotation PROM;5 reps    Flexion PROM;5 reps    ABduction PROM;5 reps      Shoulder Exercises: Seated   Extension AROM;10 reps    Row AROM;10 reps      Manual Therapy   Manual Therapy Myofascial release    Manual therapy comments Manual therapy completed prior to exercises.    Myofascial Release Myofascial release and manual stretching completed to right upper arm, upper trapezius, and scapularis region to decrease fascial restrictions and increase joint mobility in a pain free zone.                    OT Education - 08/16/21 1432     Education Details discussed progression of pain after surgery. Self myofascial release using tennis ball along medial border of scapula.    Person(s) Educated Patient    Methods  Explanation;Demonstration;Verbal cues    Comprehension Verbalized understanding;Returned demonstration              OT Short Term Goals - 08/16/21 1437       OT SHORT TERM GOAL #1   Title Pt will be provided with and educated on HEP to improve mobility required to complete ADLs.    Time 4    Period Weeks    Status On-going    Target Date 09/10/21      OT SHORT TERM GOAL #2   Title Pt will increase P/[facility] improve ability to perform dressing tasks with less compensatory strategies.    Time 4    Period Weeks    Status On-going  OT SHORT TERM GOAL #3   Title Pt will increase RUE strength to 3/5 to improve ability to reach items at waist to chest height.    Time 4    Period Weeks    Status On-going               OT Long Term Goals - 08/16/21 1437       OT LONG TERM GOAL #1   Title Pt will decrease pain in RUE to 3/10 or less to improve ability to sleep for 3+ hours without waking due to pain.    Time 8    Period Weeks    Status On-going      OT LONG TERM GOAL #2   Title Pt will decrease RUE fascial restrictions to minimal amounts or less to improve ability to perform overhead reaching tasks.    Time 8    Period Weeks    Status On-going      OT LONG TERM GOAL #3   Title Pt will increase RUE A/ROM to [facility] to improve ability to perform dressing and bathing tasks.    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    Time 8    Period Weeks    Status On-going                   Plan - 08/16/21 1548     Clinical Impression Statement A: Initiated myofascial release to address mod-max fascial restrictions located in the right upper arm and upper trapezius region. Passive stretching was tolerated to just under 90 degrees for flexion and abduction. Pt able to achieve 0 degrees of passive external rotation. VC  for form and technique were provided during A/ROM scapular exercises.    Body Structure / Function / Physical Skills ADL;Endurance;Muscle spasms;UE functional use;Fascial restriction;Pain;ROM;IADL;Strength    Plan P: Add thumb tacks and isometrics.    Consulted and Agree with Plan of Care Patient             Patient will benefit from skilled therapeutic intervention in order to improve the following deficits and impairments:   Body Structure / Function / Physical Skills: ADL, Endurance, Muscle spasms, UE functional use, Fascial restriction, Pain, ROM, IADL, Strength       Visit Diagnosis: Other symptoms and signs involving the musculoskeletal system  Stiffness of right shoulder, not elsewhere classified  Acute pain of right shoulder    Problem List Patient Active Problem List   Diagnosis Date Noted   Empyema lung ([place]) 07/20/2019   Acute respiratory failure with hypoxia ([place]) 07/20/2019   Empyema ([place]) 07/20/2019   Acute respiratory distress 07/19/2019   Pleural effusion 07/19/2019   Gastric ulcer 09/22/2018   Other cirrhosis of liver ([place]) 08/07/2018   Diabetes ([place])    Cirrhosis of liver with ascites [facility])    Ailene Ravel, OTR/L,CBIS  [phone]   08/16/2021, 3:52 PM  [place], [place], [address] Phone: [phone]   Fax:  [phone]  Name: Lawrence Rogers MRN: [id number] Date of Birth: June 17, 1961

## 2021-08-18 ENCOUNTER — Encounter (HOSPITAL_COMMUNITY): Payer: Managed Care, Other (non HMO) | Admitting: Occupational Therapy

## 2021-08-23 ENCOUNTER — Encounter (HOSPITAL_COMMUNITY): Payer: Self-pay

## 2021-08-23 ENCOUNTER — Other Ambulatory Visit: Payer: Self-pay

## 2021-08-23 ENCOUNTER — Ambulatory Visit (HOSPITAL_COMMUNITY): Payer: No Typology Code available for payment source | Attending: Orthopedic Surgery

## 2021-08-23 ENCOUNTER — Telehealth (HOSPITAL_COMMUNITY): Payer: Self-pay

## 2021-08-23 DIAGNOSIS — M25611 Stiffness of right shoulder, not elsewhere classified: Secondary | ICD-10-CM | POA: Diagnosis not present

## 2021-08-23 DIAGNOSIS — R29898 Other symptoms and signs involving the musculoskeletal system: Secondary | ICD-10-CM | POA: Diagnosis present

## 2021-08-23 DIAGNOSIS — M25511 Pain in right shoulder: Secondary | ICD-10-CM | POA: Insufficient documentation

## 2021-08-23 NOTE — Therapy (Signed)
[facility] [address] [place], [place], [address] Phone: [phone]   Fax:  [phone]  Occupational Therapy Treatment  Patient Details  Name: Lawrence Rogers MRN: [id number] Date of Birth: 03/27/1961 Referring Provider (OT): Dr. Victorino December   Encounter Date: 08/23/2021   OT End of Session - 08/23/21 1629     Visit Number 3    Number of Visits 16    Date for OT Re-Evaluation 10/10/21   09/09/2021 mini-reassessment   Authorization Type Worker's Comp    Authorization Time Period 12 visits approved    Authorization - Visit Number 3    Authorization - Number of Visits 12    OT Start Time [phone]    OT Stop Time 1037    OT Time Calculation (min) 38 min    Activity Tolerance Patient tolerated treatment well    Behavior During Therapy [facility] for tasks assessed/performed             Past Medical History:  Diagnosis Date   Arthritis    Cirrhosis ([place])    Depression    Diabetes ([place])     Past Surgical History:  Procedure Laterality Date   CATARACT EXTRACTION W/PHACO Left 10/26/2019   Procedure: CATARACT EXTRACTION PHACO AND INTRAOCULAR LENS PLACEMENT LEFT EYE CDE=5.20;  Surgeon: Baruch Goldmann, MD;  [facility];  Service: Ophthalmology;  Laterality: Left;  left   CATARACT EXTRACTION W/PHACO Right 11/09/2019   Procedure: CATARACT EXTRACTION PHACO AND INTRAOCULAR LENS PLACEMENT (IOC);  Surgeon: Baruch Goldmann, MD;  [facility]: [facility];  Service: Ophthalmology;  Laterality: Right;  CDE: 3.03   COLONOSCOPY WITH PROPOFOL N/A 12/04/2019   Procedure: COLONOSCOPY WITH PROPOFOL;  Surgeon: Rogene Houston, MD;  [facility];  Service: Endoscopy;  Laterality: N/A;   ESOPHAGEAL BANDING N/A 08/29/2018   Procedure: ESOPHAGEAL BANDING;  Surgeon: Rogene Houston, MD;  [facility];  Service: Endoscopy;  Laterality: N/A;   ESOPHAGEAL BANDING  10/24/2018   Procedure: ESOPHAGEAL BANDING;  Surgeon: Rogene Houston, MD;   [facility];  Service: Endoscopy;;  3 bands   ESOPHAGEAL BANDING  12/04/2019   Procedure: ESOPHAGEAL BANDING;  Surgeon: Rogene Houston, MD;  [facility];  Service: Endoscopy;;   ESOPHAGOGASTRODUODENOSCOPY (EGD) WITH PROPOFOL N/A 08/29/2018   Procedure: ESOPHAGOGASTRODUODENOSCOPY (EGD) WITH PROPOFOL;  Surgeon: Rogene Houston, MD;  [facility];  Service: Endoscopy;  Laterality: N/A;  9:30   ESOPHAGOGASTRODUODENOSCOPY (EGD) WITH PROPOFOL N/A 10/24/2018   Procedure: ESOPHAGOGASTRODUODENOSCOPY (EGD) WITH PROPOFOL;  Surgeon: Rogene Houston, MD;  [facility];  Service: Endoscopy;  Laterality: N/A;  7:30   ESOPHAGOGASTRODUODENOSCOPY (EGD) WITH PROPOFOL N/A 12/04/2019   Procedure: ESOPHAGOGASTRODUODENOSCOPY (EGD) WITH PROPOFOL;  Surgeon: Rogene Houston, MD;  [facility];  Service: Endoscopy;  Laterality: N/A;   IR PARACENTESIS  04/18/2018   IR TRANSCATHETER BX  04/18/2018   IR US GUIDE VASC ACCESS RIGHT  04/18/2018   IR VENOGRAM HEPATIC W HEMODYNAMIC EVALUATION  04/18/2018   PLEURAL EFFUSION DRAINAGE Right 07/22/2019   Procedure: Drainage Of Pleural Effusion;  Surgeon: Lajuana Matte, MD;  Location: [facility];  Service: Thoracic;  Laterality: Right;   POLYPECTOMY  12/04/2019   Procedure: POLYPECTOMY;  Surgeon: Rogene Houston, MD;  [facility];  Service: Endoscopy;;  hepatic flexure, cecal, proximal transverse colon   SHOULDER ARTHROSCOPY WITH ROTATOR CUFF REPAIR Right 08/04/2021   Procedure: SHOULDER ARTHROSCOPY WITH ROTATOR  CUFF REPAIR,EXTENSIVE DEBRIDEMENT, SUBACROMIAL DECOMPRESSION,DISTAL CLAVICLE RESCTION;  Surgeon: Nicholes Stairs, MD;  [facility];  Service: Orthopedics;  Laterality: Right;  124mn   VIDEO ASSISTED THORACOSCOPY (VATS)/DECORTICATION Right 07/22/2019   Procedure: VIDEO ASSISTED THORACOSCOPY (VATS)/DECORTICATION;  Surgeon: LLajuana Matte MD;  Location: [facility]  Service: Thoracic;  Laterality: Right;     There were no vitals filed for this visit.   Subjective Assessment - 08/23/21 1028     Subjective  S: I had a lot of pain this morning when I woke up.    Currently in Pain? Yes    Pain Score 2     Pain Location Shoulder    Pain Orientation Right    Pain Descriptors / Indicators Aching    Pain Type Surgical pain    Pain Radiating Towards middle of scapular and posterior shoulder    Pain Onset In the past 7 days    Pain Frequency Intermittent    Aggravating Factors  movement    Pain Relieving Factors pain medication, ice    Effect of Pain on Daily Activities unable to use RUE for ADLs                [facility] 08/23/21 1031       Assessment   Medical Diagnosis s/p right RCR, extensive debridement, SAD, DCR      Precautions   Precautions Shoulder    Type of Shoulder Precautions RTC protocol. See media tab    Shoulder Interventions Shoulder sling/immobilizer;Shoulder abduction pillow;Off for dressing/bathing/exercises                      OT Treatments/Exercises (OP) - 08/23/21 1032       Exercises   Exercises Shoulder      Shoulder Exercises: Supine   Protraction PROM;5 reps    Horizontal ABduction PROM;5 reps    External Rotation PROM;5 reps    Internal Rotation PROM;5 reps    Flexion PROM;5 reps    ABduction PROM;5 reps      Shoulder Exercises: Seated   Other Seated Exercises scapular depression; A/ROM, 10X      Shoulder Exercises: Therapy Ball   Flexion 10 reps   2" hold at end stretch   ABduction 10 reps   2" hold at end stretch     Shoulder Exercises: ROM/Strengthening   Thumb Tacks 1' low level      Shoulder Exercises: Isometric Strengthening   Flexion Supine;3X3"    Extension Supine;3X3"    External Rotation Supine;3X3"    Internal Rotation Supine;3X3"    ABduction Supine;3X3"    ADduction Supine;3X3"      Manual Therapy   Manual Therapy Myofascial release    Manual therapy comments Manual therapy completed prior  to exercises.    Myofascial Release Myofascial release and manual stretching completed to right upper arm, upper trapezius, and scapularis region to decrease fascial restrictions and increase joint mobility in a pain free zone.                      OT Short Term Goals - 08/16/21 1437       OT SHORT TERM GOAL #1   Title Pt will be provided with and educated on HEP to improve mobility required to complete ADLs.    Time 4    Period Weeks    Status On-going    Target Date 09/10/21      OT SHORT TERM  GOAL #2   Title Pt will increase P/ROM in RUE to [facility] to improve ability to perform dressing tasks with less compensatory strategies.    Time 4    Period Weeks    Status On-going      OT SHORT TERM GOAL #3   Title Pt will increase RUE strength to 3/5 to improve ability to reach items at waist to chest height.    Time 4    Period Weeks    Status On-going               OT Long Term Goals - 08/16/21 1437       OT LONG TERM GOAL #1   Title Pt will decrease pain in RUE to 3/10 or less to improve ability to sleep for 3+ hours without waking due to pain.    Time 8    Period Weeks    Status On-going      OT LONG TERM GOAL #2   Title Pt will decrease RUE fascial restrictions to minimal amounts or less to improve ability to perform overhead reaching tasks.    Time 8    Period Weeks    Status On-going      OT LONG TERM GOAL #3   Title Pt will increase RUE A/ROM to [facility] to improve ability to perform dressing and bathing tasks.    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    Time 8    Period Weeks    Status On-going                   Plan - 08/23/21 1630     Clinical Impression Statement A: Completed myofascial release to address mod fascial restrictions located in right upper arm region. Patient able to tolerate just under  90 degrees of passive ROM flexion and 90 degrees of abduction passively. Added isometrics supine and thumb tacks standing while providing VC for form and technique.    Body Structure / Function / Physical Skills ADL;Endurance;Muscle spasms;UE functional use;Fascial restriction;Pain;ROM;IADL;Strength    Plan P: Continue to follow protocol Work on increasing ROM tolerance within pain level.    Consulted and Agree with Plan of Care Patient             Patient will benefit from skilled therapeutic intervention in order to improve the following deficits and impairments:   Body Structure / Function / Physical Skills: ADL, Endurance, Muscle spasms, UE functional use, Fascial restriction, Pain, ROM, IADL, Strength       Visit Diagnosis: Stiffness of right shoulder, not elsewhere classified  Other symptoms and signs involving the musculoskeletal system  Acute pain of right shoulder    Problem List Patient Active Problem List   Diagnosis Date Noted   Empyema lung ([place]) 07/20/2019   Acute respiratory failure with hypoxia ([place]) 07/20/2019   Empyema ([place]) 07/20/2019   Acute respiratory distress 07/19/2019   Pleural effusion 07/19/2019   Gastric ulcer 09/22/2018   Other cirrhosis of liver ([place]) 08/07/2018   Diabetes ([place])    Cirrhosis of liver with ascites [facility])     Ailene Ravel, OTR/L,CBIS  [phone]  08/23/2021, 4:34 PM  [place], [place], [address] Phone: [phone]   Fax:  [phone]  Name: JERID CATHERMAN MRN: [id number] Date of Birth: February 17, 1961

## 2021-08-23 NOTE — Telephone Encounter (Signed)
S/w with Izora Gala Rep @[email or web address]  Worker's comp gave eval date and tx dates.

## 2021-08-25 ENCOUNTER — Other Ambulatory Visit: Payer: Self-pay

## 2021-08-25 ENCOUNTER — Encounter (HOSPITAL_COMMUNITY): Payer: Self-pay | Admitting: Occupational Therapy

## 2021-08-25 ENCOUNTER — Ambulatory Visit (HOSPITAL_COMMUNITY): Payer: No Typology Code available for payment source | Attending: Orthopedic Surgery | Admitting: Occupational Therapy

## 2021-08-25 DIAGNOSIS — M25511 Pain in right shoulder: Secondary | ICD-10-CM | POA: Insufficient documentation

## 2021-08-25 DIAGNOSIS — M25611 Stiffness of right shoulder, not elsewhere classified: Secondary | ICD-10-CM | POA: Insufficient documentation

## 2021-08-25 DIAGNOSIS — R29898 Other symptoms and signs involving the musculoskeletal system: Secondary | ICD-10-CM | POA: Insufficient documentation

## 2021-08-25 NOTE — Therapy (Signed)
[facility] [address] [place], [place], [address] Phone: [phone]   Fax:  [phone]  Occupational Therapy Treatment  Patient Details  Name: Lawrence Rogers MRN: [id number] Date of Birth: 01/07/1961 Referring Provider (OT): Dr. Victorino December   Encounter Date: 08/25/2021   OT End of Session - 08/25/21 1025     Visit Number 4    Number of Visits 16    Date for OT Re-Evaluation 10/10/21   09/09/2021 mini-reassessment   Authorization Type Worker's Comp    Authorization Time Period 12 visits approved    Authorization - Visit Number 4    Authorization - Number of Visits 12    OT Start Time 0945    OT Stop Time 7672    OT Time Calculation (min) 43 min    Activity Tolerance Patient tolerated treatment well    Behavior During Therapy [facility] for tasks assessed/performed             Past Medical History:  Diagnosis Date   Arthritis    Cirrhosis ([place])    Depression    Diabetes ([place])     Past Surgical History:  Procedure Laterality Date   CATARACT EXTRACTION W/PHACO Left 10/26/2019   Procedure: CATARACT EXTRACTION PHACO AND INTRAOCULAR LENS PLACEMENT LEFT EYE CDE=5.20;  Surgeon: Baruch Goldmann, MD;  [facility];  Service: Ophthalmology;  Laterality: Left;  left   CATARACT EXTRACTION W/PHACO Right 11/09/2019   Procedure: CATARACT EXTRACTION PHACO AND INTRAOCULAR LENS PLACEMENT (IOC);  Surgeon: Baruch Goldmann, MD;  [facility];  Service: Ophthalmology;  Laterality: Right;  CDE: 3.03   COLONOSCOPY WITH PROPOFOL N/A 12/04/2019   Procedure: COLONOSCOPY WITH PROPOFOL;  Surgeon: Rogene Houston, MD;  [facility];  Service: Endoscopy;  Laterality: N/A;   ESOPHAGEAL BANDING N/A 08/29/2018   Procedure: ESOPHAGEAL BANDING;  Surgeon: Rogene Houston, MD;  [facility];  Service: Endoscopy;  Laterality: N/A;   ESOPHAGEAL BANDING  10/24/2018   Procedure: ESOPHAGEAL BANDING;  Surgeon: Rogene Houston, MD;   [facility];  Service: Endoscopy;;  3 bands   ESOPHAGEAL BANDING  12/04/2019   Procedure: ESOPHAGEAL BANDING;  Surgeon: Rogene Houston, MD;  [facility];  Service: Endoscopy;;   ESOPHAGOGASTRODUODENOSCOPY (EGD) WITH PROPOFOL N/A 08/29/2018   Procedure: ESOPHAGOGASTRODUODENOSCOPY (EGD) WITH PROPOFOL;  Surgeon: Rogene Houston, MD;  [facility];  Service: Endoscopy;  Laterality: N/A;  9:30   ESOPHAGOGASTRODUODENOSCOPY (EGD) WITH PROPOFOL N/A 10/24/2018   Procedure: ESOPHAGOGASTRODUODENOSCOPY (EGD) WITH PROPOFOL;  Surgeon: Rogene Houston, MD;  [facility];  Service: Endoscopy;  Laterality: N/A;  7:30   ESOPHAGOGASTRODUODENOSCOPY (EGD) WITH PROPOFOL N/A 12/04/2019   Procedure: ESOPHAGOGASTRODUODENOSCOPY (EGD) WITH PROPOFOL;  Surgeon: Rogene Houston, MD;  [facility];  Service: Endoscopy;  Laterality: N/A;   IR PARACENTESIS  04/18/2018   IR TRANSCATHETER BX  04/18/2018   IR US GUIDE VASC ACCESS RIGHT  04/18/2018   IR VENOGRAM HEPATIC W HEMODYNAMIC EVALUATION  04/18/2018   PLEURAL EFFUSION DRAINAGE Right 07/22/2019   Procedure: Drainage Of Pleural Effusion;  Surgeon: Lajuana Matte, MD;  Location: [facility];  Service: Thoracic;  Laterality: Right;   POLYPECTOMY  12/04/2019   Procedure: POLYPECTOMY;  Surgeon: Rogene Houston, MD;  [facility];  Service: Endoscopy;;  hepatic flexure, cecal, proximal transverse colon   SHOULDER ARTHROSCOPY WITH ROTATOR CUFF REPAIR Right 08/04/2021   Procedure: SHOULDER ARTHROSCOPY WITH ROTATOR  CUFF REPAIR,EXTENSIVE DEBRIDEMENT, SUBACROMIAL DECOMPRESSION,DISTAL CLAVICLE RESCTION;  Surgeon: Nicholes Stairs, MD;  [facility];  Service: Orthopedics;  Laterality: Right;  145mn   VIDEO ASSISTED THORACOSCOPY (VATS)/DECORTICATION Right 07/22/2019   Procedure: VIDEO ASSISTED THORACOSCOPY (VATS)/DECORTICATION;  Surgeon: LLajuana Matte MD;  [facility]  Service: Thoracic;  Laterality: Right;     There were no vitals filed for this visit.   Subjective Assessment - 08/25/21 0947     Subjective  S: I was really sore after that last session.    Currently in Pain? Yes    Pain Score 4     Pain Location Shoulder    Pain Orientation Right    Pain Descriptors / Indicators Sore    Pain Type Acute pain    Pain Radiating Towards none    Pain Onset In the past 7 days    Pain Frequency Intermittent    Aggravating Factors  movement    Pain Relieving Factors pain medication, ice    Effect of Pain on Daily Activities unable to use RUE for ADLs                [facility]       Assessment   Medical Diagnosis s/p right RCR, extensive debridement, SAD, DCR      Precautions   Precautions Shoulder    Type of Shoulder Precautions RTC protocol. See media tab    Shoulder Interventions Shoulder sling/immobilizer;Shoulder abduction pillow;Off for dressing/bathing/exercises                      OT Treatments/Exercises (OP) - 08/25/21 0948       Exercises   Exercises Shoulder      Shoulder Exercises: Supine   Protraction PROM;5 reps    Horizontal ABduction PROM;5 reps    External Rotation PROM;5 reps    Internal Rotation PROM;5 reps    Flexion PROM;5 reps    ABduction PROM;5 reps      Shoulder Exercises: Seated   Extension AROM;10 reps    Row AROM;10 reps      Shoulder Exercises: Therapy Ball   Flexion 10 reps    ABduction 10 reps      Shoulder Exercises: Isometric Strengthening   Flexion Supine;3X3"    Extension Supine;3X3"    External Rotation Supine;3X3"    Internal Rotation Supine;3X3"    ABduction Supine;3X3"    ADduction Supine;3X3"      Modalities   Modalities Electrical Stimulation;Moist Heat      Moist Heat Therapy   Number Minutes Moist Heat 10 Minutes    Moist Heat Location Shoulder      Electrical Stimulation   Electrical Stimulation Location right shoulder    Electrical Stimulation Action interferential     Electrical Stimulation Parameters 16.0 CV    Electrical Stimulation Goals Pain      Manual Therapy   Manual Therapy Myofascial release    Manual therapy comments Manual therapy completed prior to exercises.    Myofascial Release Myofascial release and manual stretching completed to right upper arm, upper trapezius, and scapularis region to decrease fascial restrictions and increase joint mobility in a pain free zone.                      OT Short Term Goals - 08/16/21 1437       OT SHORT TERM GOAL #1   Title Pt will be provided with and educated on  HEP to improve mobility required to complete ADLs.    Time 4    Period Weeks    Status On-going    Target Date 09/10/21      OT SHORT TERM GOAL #2   Title Pt will increase P/ROM in RUE to [facility] to improve ability to perform dressing tasks with less compensatory strategies.    Time 4    Period Weeks    Status On-going      OT SHORT TERM GOAL #3   Title Pt will increase RUE strength to 3/5 to improve ability to reach items at waist to chest height.    Time 4    Period Weeks    Status On-going               OT Long Term Goals - 08/16/21 1437       OT LONG TERM GOAL #1   Title Pt will decrease pain in RUE to 3/10 or less to improve ability to sleep for 3+ hours without waking due to pain.    Time 8    Period Weeks    Status On-going      OT LONG TERM GOAL #2   Title Pt will decrease RUE fascial restrictions to minimal amounts or less to improve ability to perform overhead reaching tasks.    Time 8    Period Weeks    Status On-going      OT LONG TERM GOAL #3   Title Pt will increase RUE A/ROM to [facility] to improve ability to perform dressing and bathing tasks.    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    Time 8    Period Weeks    Status On-going                    Plan - 08/25/21 1011     Clinical Impression Statement A: Pt reporting significant soreness after previous session. During myofascial release today pt reporting pain increasing to 7/10, limited tolerance for P/ROM. Continued with isometrics. Trialed ES for pain management, pt reporting pain at 3/10 after ES. Pt completing scapular A/ROM and therapy ball stretches after ES. Verbal cuing for form and technique.    Body Structure / Function / Physical Skills ADL;Endurance;Muscle spasms;UE functional use;Fascial restriction;Pain;ROM;IADL;Strength    Plan P: Continue to follow protocol. Gentle myofascial release as able to tolerate. Work on increasing ROM tolerance within pain level. ES as needed for pain management    OT Home Exercise Plan eval: table slides    Consulted and Agree with Plan of Care Patient             Patient will benefit from skilled therapeutic intervention in order to improve the following deficits and impairments:   Body Structure / Function / Physical Skills: ADL, Endurance, Muscle spasms, UE functional use, Fascial restriction, Pain, ROM, IADL, Strength       Visit Diagnosis: Stiffness of right shoulder, not elsewhere classified  Other symptoms and signs involving the musculoskeletal system  Acute pain of right shoulder    Problem List Patient Active Problem List   Diagnosis Date Noted   Empyema lung ([place]) 07/20/2019   Acute respiratory failure with hypoxia ([place]) 07/20/2019   Empyema ([place]) 07/20/2019   Acute respiratory distress  07/19/2019   Pleural effusion 07/19/2019   Gastric ulcer 09/22/2018   Other cirrhosis of liver ([place]) 08/07/2018   Diabetes ([place])    Cirrhosis of liver with ascites [facility])     Guadelupe Sabin, OTR/L  [phone] 08/25/2021, 10:30 AM  [place], [place], [address] Phone: [phone]   Fax:  [phone]  Name: BURDETTE FOREHAND MRN:  [id number] Date of Birth: Jul 23, 1961

## 2021-08-29 ENCOUNTER — Ambulatory Visit (HOSPITAL_COMMUNITY): Payer: No Typology Code available for payment source

## 2021-08-29 ENCOUNTER — Other Ambulatory Visit: Payer: Self-pay

## 2021-08-29 ENCOUNTER — Encounter (HOSPITAL_COMMUNITY): Payer: Self-pay

## 2021-08-29 DIAGNOSIS — R29898 Other symptoms and signs involving the musculoskeletal system: Secondary | ICD-10-CM

## 2021-08-29 DIAGNOSIS — M25611 Stiffness of right shoulder, not elsewhere classified: Secondary | ICD-10-CM | POA: Diagnosis not present

## 2021-08-29 DIAGNOSIS — M25511 Pain in right shoulder: Secondary | ICD-10-CM

## 2021-08-29 NOTE — Therapy (Signed)
[facility] [address] [place], [place], [address] Phone: [phone]   Fax:  [phone]  Occupational Therapy Treatment  Patient Details  Name: Lawrence Rogers MRN: [id number] Date of Birth: Feb 27, 1961 Referring Provider (OT): Dr. Victorino December   Encounter Date: 08/29/2021   OT End of Session - 08/29/21 0947     Visit Number 5    Number of Visits 16    Date for OT Re-Evaluation 10/10/21   09/09/2021 mini-reassessment   Authorization Type Worker's Comp    Authorization Time Period 12 visits approved    Authorization - Visit Number 5    Authorization - Number of Visits 12    OT Start Time 0900    OT Stop Time 0938    OT Time Calculation (min) 38 min    Activity Tolerance Patient tolerated treatment well    Behavior During Therapy [facility] for tasks assessed/performed             Past Medical History:  Diagnosis Date   Arthritis    Cirrhosis ([place])    Depression    Diabetes ([place])     Past Surgical History:  Procedure Laterality Date   CATARACT EXTRACTION W/PHACO Left 10/26/2019   Procedure: CATARACT EXTRACTION PHACO AND INTRAOCULAR LENS PLACEMENT LEFT EYE CDE=5.20;  Surgeon: Baruch Goldmann, MD;  [facility];  Service: Ophthalmology;  Laterality: Left;  left   CATARACT EXTRACTION W/PHACO Right 11/09/2019   Procedure: CATARACT EXTRACTION PHACO AND INTRAOCULAR LENS PLACEMENT (IOC);  Surgeon: Baruch Goldmann, MD;  [facility];  Service: Ophthalmology;  Laterality: Right;  CDE: 3.03   COLONOSCOPY WITH PROPOFOL N/A 12/04/2019   Procedure: COLONOSCOPY WITH PROPOFOL;  Surgeon: Rogene Houston, MD;  [facility];  Service: Endoscopy;  Laterality: N/A;   ESOPHAGEAL BANDING N/A 08/29/2018   Procedure: ESOPHAGEAL BANDING;  Surgeon: Rogene Houston, MD;  [facility];  Service: Endoscopy;  Laterality: N/A;   ESOPHAGEAL BANDING  10/24/2018   Procedure: ESOPHAGEAL BANDING;  Surgeon: Rogene Houston, MD;   [facility];  Service: Endoscopy;;  3 bands   ESOPHAGEAL BANDING  12/04/2019   Procedure: ESOPHAGEAL BANDING;  Surgeon: Rogene Houston, MD;  [facility];  Service: Endoscopy;;   ESOPHAGOGASTRODUODENOSCOPY (EGD) WITH PROPOFOL N/A 08/29/2018   Procedure: ESOPHAGOGASTRODUODENOSCOPY (EGD) WITH PROPOFOL;  Surgeon: Rogene Houston, MD;  [facility];  Service: Endoscopy;  Laterality: N/A;  9:30   ESOPHAGOGASTRODUODENOSCOPY (EGD) WITH PROPOFOL N/A 10/24/2018   Procedure: ESOPHAGOGASTRODUODENOSCOPY (EGD) WITH PROPOFOL;  Surgeon: Rogene Houston, MD;  [facility];  Service: Endoscopy;  Laterality: N/A;  7:30   ESOPHAGOGASTRODUODENOSCOPY (EGD) WITH PROPOFOL N/A 12/04/2019   Procedure: ESOPHAGOGASTRODUODENOSCOPY (EGD) WITH PROPOFOL;  Surgeon: Rogene Houston, MD;  [facility];  Service: Endoscopy;  Laterality: N/A;   IR PARACENTESIS  04/18/2018   IR TRANSCATHETER BX  04/18/2018   IR US GUIDE VASC ACCESS RIGHT  04/18/2018   IR VENOGRAM HEPATIC W HEMODYNAMIC EVALUATION  04/18/2018   PLEURAL EFFUSION DRAINAGE Right 07/22/2019   Procedure: Drainage Of Pleural Effusion;  Surgeon: Lajuana Matte, MD;  Location: [facility];  Service: Thoracic;  Laterality: Right;   POLYPECTOMY  12/04/2019   Procedure: POLYPECTOMY;  Surgeon: Rogene Houston, MD;  [facility];  Service: Endoscopy;;  hepatic flexure, cecal, proximal transverse colon   SHOULDER ARTHROSCOPY WITH ROTATOR CUFF REPAIR Right 08/04/2021   Procedure: SHOULDER ARTHROSCOPY WITH ROTATOR  CUFF REPAIR,EXTENSIVE DEBRIDEMENT, SUBACROMIAL DECOMPRESSION,DISTAL CLAVICLE RESCTION;  Surgeon: Nicholes Stairs, MD;  [facility];  Service: Orthopedics;  Laterality: Right;  164mn   VIDEO ASSISTED THORACOSCOPY (VATS)/DECORTICATION Right 07/22/2019   Procedure: VIDEO ASSISTED THORACOSCOPY (VATS)/DECORTICATION;  Surgeon: LLajuana Matte MD;  [facility]  Service: Thoracic;  Laterality: Right;     There were no vitals filed for this visit.   Subjective Assessment - 08/29/21 0924     Subjective  S: this morning it was really sore again.    Currently in Pain? Yes    Pain Score 5     Pain Location Shoulder    Pain Orientation Right    Pain Descriptors / Indicators Sore    Pain Type Acute pain    Pain Onset 1 to 4 weeks ago    Pain Frequency Intermittent    Aggravating Factors  morning is the worse, movement    Pain Relieving Factors pain medication, heat    Effect of Pain on Daily Activities unable to use RUE for ADL tasks.    Multiple Pain Sites No                [facility] OT Assessment - 08/29/21 0925       Assessment   Medical Diagnosis s/p right RCR, extensive debridement, SAD, DCR      Precautions   Precautions Shoulder    Type of Shoulder Precautions RTC protocol. See media tab    Shoulder Interventions Shoulder sling/immobilizer;Shoulder abduction pillow;Off for dressing/bathing/exercises                      OT Treatments/Exercises (OP) - 08/29/21 0925       Exercises   Exercises Shoulder      Shoulder Exercises: Supine   Protraction PROM;5 reps    Horizontal ABduction PROM;5 reps    External Rotation PROM;5 reps    Internal Rotation PROM;5 reps    Flexion PROM;5 reps    ABduction PROM;5 reps      Shoulder Exercises: Seated   Row AROM;10 reps      Shoulder Exercises: Therapy Ball   Flexion 10 reps   2" hold at end stretch   ABduction 10 reps   2" hold at end stretch     Shoulder Exercises: Isometric Strengthening   Flexion Supine;3X3"    Extension Supine;3X3"    External Rotation Supine;3X3"    Internal Rotation Supine;3X3"    ABduction Supine;3X3"    ADduction Supine;3X3"      Manual Therapy   Manual Therapy Myofascial release    Manual therapy comments Manual therapy completed prior to exercises.    Myofascial Release Myofascial release and manual stretching completed to right upper arm, upper trapezius, and scapularis  region to decrease fascial restrictions and increase joint mobility in a pain free zone.                      OT Short Term Goals - 08/16/21 1437       OT SHORT TERM GOAL #1   Title Pt will be provided with and educated on HEP to improve mobility required to complete ADLs.    Time 4    Period Weeks    Status On-going    Target Date 09/10/21      OT SHORT TERM GOAL #2   Title Pt [facility] to perform dressing tasks with less compensatory strategies.  Time 4    Period Weeks    Status On-going      OT SHORT TERM GOAL #3   Title Pt will increase RUE strength to 3/5 to improve ability to reach items at waist to chest height.    Time 4    Period Weeks    Status On-going               OT Long Term Goals - 08/16/21 1437       OT LONG TERM GOAL #1   Title Pt will decrease pain in RUE to 3/10 or less to improve ability to sleep for 3+ hours without waking due to pain.    Time 8    Period Weeks    Status On-going      OT LONG TERM GOAL #2   Title Pt will decrease RUE fascial restrictions to minimal amounts or less to improve ability to perform overhead reaching tasks.    Time 8    Period Weeks    Status On-going      OT LONG TERM GOAL #3   Title Pt will increase RUE A/ROM to [facility] to improve ability to perform dressing and bathing tasks.    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    Time 8    Period Weeks    Status On-going                   Plan - 08/29/21 0948     Clinical Impression Statement A: Myofascial release completed to right upper arm and upper trapezius/cervical region to address min fascial restrictions. Pt was able to tolerate passive flexion and abduction just under 90 degrees. Limited tolerance with horizontal abduction. Provided VC for form and  technique throughout session.    Plan P: Continue to follow protocol. Gentle myofascial release as able to tolerate. Work on increasing ROM tolerance within pain level.    Consulted and Agree with Plan of Care Patient             Patient will benefit from skilled therapeutic intervention in order to improve the following deficits and impairments:           Visit Diagnosis: Acute pain of right shoulder  Other symptoms and signs involving the musculoskeletal system  Stiffness of right shoulder, not elsewhere classified    Problem List Patient Active Problem List   Diagnosis Date Noted   Empyema lung ([place]) 07/20/2019   Acute respiratory failure with hypoxia ([place]) 07/20/2019   Empyema ([place]) 07/20/2019   Acute respiratory distress 07/19/2019   Pleural effusion 07/19/2019   Gastric ulcer 09/22/2018   Other cirrhosis of liver ([place]) 08/07/2018   Diabetes ([place])    Cirrhosis of liver with ascites [facility])     Ailene Ravel, OTR/L,CBIS  [phone]  08/29/2021, 10:20 [place], [place], [address] Phone: [phone]   Fax:  [phone]  Name: Lawrence Rogers MRN: [id number] Date of Birth: October 01, 1961

## 2021-09-01 ENCOUNTER — Other Ambulatory Visit: Payer: Self-pay

## 2021-09-01 ENCOUNTER — Encounter (HOSPITAL_COMMUNITY): Payer: Self-pay | Admitting: Occupational Therapy

## 2021-09-01 ENCOUNTER — Ambulatory Visit (HOSPITAL_COMMUNITY): Payer: No Typology Code available for payment source | Admitting: Occupational Therapy

## 2021-09-01 DIAGNOSIS — R29898 Other symptoms and signs involving the musculoskeletal system: Secondary | ICD-10-CM

## 2021-09-01 DIAGNOSIS — M25611 Stiffness of right shoulder, not elsewhere classified: Secondary | ICD-10-CM | POA: Diagnosis not present

## 2021-09-01 DIAGNOSIS — M25511 Pain in right shoulder: Secondary | ICD-10-CM

## 2021-09-01 NOTE — Therapy (Signed)
[facility] [address] [place], [place], [address] Phone: [phone]   Fax:  [phone]  Occupational Therapy Treatment  Patient Details  Name: Lawrence Rogers MRN: [id number] Date of Birth: 17-Sep-1961 Referring Provider (OT): Dr. Victorino December   Encounter Date: 09/01/2021   OT End of Session - 09/01/21 1020     Visit Number 6    Number of Visits 16    Date for OT Re-Evaluation 10/10/21   09/09/2021 mini-reassessment   Authorization Type Worker's Comp    Authorization Time Period 12 visits approved    Authorization - Visit Number 6    Authorization - Number of Visits 12    OT Start Time [phone]    OT Stop Time 1022    OT Time Calculation (min) 39 min    Activity Tolerance Patient tolerated treatment well    Behavior During Therapy [facility] for tasks assessed/performed             Past Medical History:  Diagnosis Date   Arthritis    Cirrhosis ([place])    Depression    Diabetes ([place])     Past Surgical History:  Procedure Laterality Date   CATARACT EXTRACTION W/PHACO Left 10/26/2019   Procedure: CATARACT EXTRACTION PHACO AND INTRAOCULAR LENS PLACEMENT LEFT EYE CDE=5.20;  Surgeon: Baruch Goldmann, MD;  [facility];  Service: Ophthalmology;  Laterality: Left;  left   CATARACT EXTRACTION W/PHACO Right 11/09/2019   Procedure: CATARACT EXTRACTION PHACO AND INTRAOCULAR LENS PLACEMENT (IOC);  Surgeon: Baruch Goldmann, MD;  [facility];  Service: Ophthalmology;  Laterality: Right;  CDE: 3.03   COLONOSCOPY WITH PROPOFOL N/A 12/04/2019   Procedure: COLONOSCOPY WITH PROPOFOL;  Surgeon: Rogene Houston, MD;  [facility];  Service: Endoscopy;  Laterality: N/A;   ESOPHAGEAL BANDING N/A 08/29/2018   Procedure: ESOPHAGEAL BANDING;  Surgeon: Rogene Houston, MD;  [facility];  Service: Endoscopy;  Laterality: N/A;   ESOPHAGEAL BANDING  10/24/2018   Procedure: ESOPHAGEAL BANDING;  Surgeon: Rogene Houston, MD;   [facility];  Service: Endoscopy;;  3 bands   ESOPHAGEAL BANDING  12/04/2019   Procedure: ESOPHAGEAL BANDING;  Surgeon: Rogene Houston, MD;  [facility];  Service: Endoscopy;;   ESOPHAGOGASTRODUODENOSCOPY (EGD) WITH PROPOFOL N/A 08/29/2018   Procedure: ESOPHAGOGASTRODUODENOSCOPY (EGD) WITH PROPOFOL;  Surgeon: Rogene Houston, MD;  [facility];  Service: Endoscopy;  Laterality: N/A;  9:30   ESOPHAGOGASTRODUODENOSCOPY (EGD) WITH PROPOFOL N/A 10/24/2018   Procedure: ESOPHAGOGASTRODUODENOSCOPY (EGD) WITH PROPOFOL;  Surgeon: Rogene Houston, MD;  [facility];  Service: Endoscopy;  Laterality: N/A;  7:30   ESOPHAGOGASTRODUODENOSCOPY (EGD) WITH PROPOFOL N/A 12/04/2019   Procedure: ESOPHAGOGASTRODUODENOSCOPY (EGD) WITH PROPOFOL;  Surgeon: Rogene Houston, MD;  [facility];  Service: Endoscopy;  Laterality: N/A;   IR PARACENTESIS  04/18/2018   IR TRANSCATHETER BX  04/18/2018   IR US GUIDE VASC ACCESS RIGHT  04/18/2018   IR VENOGRAM HEPATIC W HEMODYNAMIC EVALUATION  04/18/2018   PLEURAL EFFUSION DRAINAGE Right 07/22/2019   Procedure: Drainage Of Pleural Effusion;  Surgeon: Lajuana Matte, MD;  Location: [facility];  Service: Thoracic;  Laterality: Right;   POLYPECTOMY  12/04/2019   Procedure: POLYPECTOMY;  Surgeon: Rogene Houston, MD;  [facility];  Service: Endoscopy;;  hepatic flexure, cecal, proximal transverse colon   SHOULDER ARTHROSCOPY WITH ROTATOR CUFF REPAIR Right 08/04/2021   Procedure: SHOULDER ARTHROSCOPY WITH ROTATOR  CUFF REPAIR,EXTENSIVE DEBRIDEMENT, SUBACROMIAL DECOMPRESSION,DISTAL CLAVICLE RESCTION;  Surgeon: Nicholes Stairs, MD;  [facility];  Service: Orthopedics;  Laterality: Right;  17mn   VIDEO ASSISTED THORACOSCOPY (VATS)/DECORTICATION Right 07/22/2019   Procedure: VIDEO ASSISTED THORACOSCOPY (VATS)/DECORTICATION;  Surgeon: LLajuana Matte MD;  [facility]  Service: Thoracic;  Laterality: Right;     There were no vitals filed for this visit.   Subjective Assessment - 09/01/21 0945     Subjective  S: It's getting more movement.    Currently in Pain? Yes    Pain Score 3     Pain Location Shoulder    Pain Orientation Right    Pain Descriptors / Indicators Sore    Pain Type Acute pain    Pain Radiating Towards none    Pain Onset More than a month ago    Pain Frequency Intermittent    Aggravating Factors  morning is worse    Pain Relieving Factors pain medication, heat    Effect of Pain on Daily Activities unable to use RUE for ADLs    Multiple Pain Sites No                [facility] OT Assessment - 09/01/21 0944       Assessment   Medical Diagnosis s/p right RCR, extensive debridement, SAD, DCR      Precautions   Precautions Shoulder    Type of Shoulder Precautions RTC protocol. See media tab    Shoulder Interventions Shoulder sling/immobilizer;Shoulder abduction pillow;Off for dressing/bathing/exercises                      OT Treatments/Exercises (OP) - 09/01/21 0946       Exercises   Exercises Shoulder      Shoulder Exercises: Supine   Protraction PROM;5 reps    Horizontal ABduction PROM;5 reps    External Rotation PROM;5 reps    Internal Rotation PROM;5 reps    Flexion PROM;5 reps    ABduction PROM;5 reps      Shoulder Exercises: Seated   Extension AROM;10 reps    Row AROM;10 reps      Shoulder Exercises: Therapy Ball   Flexion 10 reps   2" hold at end stretch   ABduction 10 reps   2" hold at end stretch     Shoulder Exercises: ROM/Strengthening   Thumb Tacks 1' low level    Anterior Glide 3x10"      Shoulder Exercises: Isometric Strengthening   Flexion Supine;3X5"    Extension Supine;3X5"    External Rotation Supine;3X5"    Internal Rotation Supine;3X5"    ABduction Supine;3X5"    ADduction Supine;3X5"      Manual Therapy   Manual Therapy Myofascial release    Manual therapy comments Manual therapy completed prior to  exercises.    Myofascial Release Myofascial release and manual stretching completed to right upper arm, upper trapezius, and scapularis region to decrease fascial restrictions and increase joint mobility in a pain free zone.                      OT Short Term Goals - 08/16/21 1437       OT SHORT TERM GOAL #1   Title Pt will be provided with and educated on HEP to improve mobility required to complete ADLs.    Time 4    Period Weeks    Status On-going    Target Date 09/10/21  OT SHORT TERM GOAL #2   Title Pt will increase P/ROM in RUE to [facility] to improve ability to perform dressing tasks with less compensatory strategies.    Time 4    Period Weeks    Status On-going      OT SHORT TERM GOAL #3   Title Pt will increase RUE strength to 3/5 to improve ability to reach items at waist to chest height.    Time 4    Period Weeks    Status On-going               OT Long Term Goals - 08/16/21 1437       OT LONG TERM GOAL #1   Title Pt will decrease pain in RUE to 3/10 or less to improve ability to sleep for 3+ hours without waking due to pain.    Time 8    Period Weeks    Status On-going      OT LONG TERM GOAL #2   Title Pt will decrease RUE fascial restrictions to minimal amounts or less to improve ability to perform overhead reaching tasks.    Time 8    Period Weeks    Status On-going      OT LONG TERM GOAL #3   Title Pt will increase RUE A/ROM to [facility] to improve ability to perform dressing and bathing tasks.    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    Time 8    Period Weeks    Status On-going                   Plan - 09/01/21 1010     Clinical Impression Statement A: Continued with myofascial release to right upper arm and trapezius regions to address fascial restrictions. Continued with P/ROM with pt  tolerating approximately 90 degrees of flexion and abduction, limited to neutral with er. Increased isometric hold times to 5". Continued with scapular A/ROM and ball stretches. Verbal cuing for form and technique.    Body Structure / Function / Physical Skills ADL;Endurance;Muscle spasms;UE functional use;Fascial restriction;Pain;ROM;IADL;Strength    Plan P: Continue to follow protocol. Gentle myofascial release as able to tolerate. Work on increasing ROM tolerance within pain level.    OT Home Exercise Plan eval: table slides    Consulted and Agree with Plan of Care Patient             Patient will benefit from skilled therapeutic intervention in order to improve the following deficits and impairments:   Body Structure / Function / Physical Skills: ADL, Endurance, Muscle spasms, UE functional use, Fascial restriction, Pain, ROM, IADL, Strength       Visit Diagnosis: Acute pain of right shoulder  Other symptoms and signs involving the musculoskeletal system  Stiffness of right shoulder, not elsewhere classified    Problem List Patient Active Problem List   Diagnosis Date Noted   Empyema lung ([place]) 07/20/2019   Acute respiratory failure with hypoxia ([place]) 07/20/2019   Empyema ([place]) 07/20/2019   Acute respiratory distress 07/19/2019   Pleural effusion 07/19/2019   Gastric ulcer 09/22/2018   Other cirrhosis of liver ([place]) 08/07/2018   Diabetes ([place])    Cirrhosis of liver with ascites ([place])     Guadelupe Sabin, OTR/L  [phone] 09/01/2021, 10:23 AM  [facility] [address] [place], [place], [address] Phone: [phone]   Fax:  [phone]  Name: AUDON HEYMANN MRN: [id number] Date of Birth: [birth year]

## 2021-09-05 ENCOUNTER — Ambulatory Visit (HOSPITAL_COMMUNITY): Payer: No Typology Code available for payment source

## 2021-09-05 ENCOUNTER — Other Ambulatory Visit: Payer: Self-pay

## 2021-09-05 ENCOUNTER — Encounter (HOSPITAL_COMMUNITY): Payer: Self-pay

## 2021-09-05 DIAGNOSIS — M25611 Stiffness of right shoulder, not elsewhere classified: Secondary | ICD-10-CM | POA: Diagnosis not present

## 2021-09-05 DIAGNOSIS — M25511 Pain in right shoulder: Secondary | ICD-10-CM

## 2021-09-05 DIAGNOSIS — R29898 Other symptoms and signs involving the musculoskeletal system: Secondary | ICD-10-CM

## 2021-09-05 NOTE — Therapy (Signed)
[facility] [address] [place], [place], [address] Phone: [phone]   Fax:  [phone]  Occupational Therapy Treatment  Patient Details  Name: Lawrence Rogers MRN: [id number] Date of Birth: 08-Aug-1961 Referring Provider (OT): Dr. Victorino December   Encounter Date: 09/05/2021   OT End of Session - 09/05/21 1501     Visit Number 7    Number of Visits 16    Date for OT Re-Evaluation 10/10/21   09/09/2021 mini-reassessment   Authorization Type Worker's Comp    Authorization Time Period 12 visits approved    Authorization - Visit Number 7    Authorization - Number of Visits 12    OT Start Time 0945    OT Stop Time 5361    OT Time Calculation (min) 38 min    Activity Tolerance Patient tolerated treatment well    Behavior During Therapy [facility] for tasks assessed/performed             Past Medical History:  Diagnosis Date   Arthritis    Cirrhosis ([place])    Depression    Diabetes ([place])     Past Surgical History:  Procedure Laterality Date   CATARACT EXTRACTION W/PHACO Left 10/26/2019   Procedure: CATARACT EXTRACTION PHACO AND INTRAOCULAR LENS PLACEMENT LEFT EYE CDE=5.20;  Surgeon: Baruch Goldmann, MD;  [facility];  Service: Ophthalmology;  Laterality: Left;  left   CATARACT EXTRACTION W/PHACO Right 11/09/2019   Procedure: CATARACT EXTRACTION PHACO AND INTRAOCULAR LENS PLACEMENT (IOC);  Surgeon: Baruch Goldmann, MD;  [facility]: [facility];  Service: Ophthalmology;  Laterality: Right;  CDE: 3.03   COLONOSCOPY WITH PROPOFOL N/A 12/04/2019   Procedure: COLONOSCOPY WITH PROPOFOL;  Surgeon: Rogene Houston, MD;  [facility];  Service: Endoscopy;  Laterality: N/A;   ESOPHAGEAL BANDING N/A 08/29/2018   Procedure: ESOPHAGEAL BANDING;  Surgeon: Rogene Houston, MD;  [facility];  Service: Endoscopy;  Laterality: N/A;   ESOPHAGEAL BANDING  10/24/2018   Procedure: ESOPHAGEAL BANDING;  Surgeon: Rogene Houston, MD;   [facility];  Service: Endoscopy;;  3 bands   ESOPHAGEAL BANDING  12/04/2019   Procedure: ESOPHAGEAL BANDING;  Surgeon: Rogene Houston, MD;  [facility];  Service: Endoscopy;;   ESOPHAGOGASTRODUODENOSCOPY (EGD) WITH PROPOFOL N/A 08/29/2018   Procedure: ESOPHAGOGASTRODUODENOSCOPY (EGD) WITH PROPOFOL;  Surgeon: Rogene Houston, MD;  [facility];  Service: Endoscopy;  Laterality: N/A;  9:30   ESOPHAGOGASTRODUODENOSCOPY (EGD) WITH PROPOFOL N/A 10/24/2018   Procedure: ESOPHAGOGASTRODUODENOSCOPY (EGD) WITH PROPOFOL;  Surgeon: Rogene Houston, MD;  [facility];  Service: Endoscopy;  Laterality: N/A;  7:30   ESOPHAGOGASTRODUODENOSCOPY (EGD) WITH PROPOFOL N/A 12/04/2019   Procedure: ESOPHAGOGASTRODUODENOSCOPY (EGD) WITH PROPOFOL;  Surgeon: Rogene Houston, MD;  [facility];  Service: Endoscopy;  Laterality: N/A;   IR PARACENTESIS  04/18/2018   IR TRANSCATHETER BX  04/18/2018   IR US GUIDE VASC ACCESS RIGHT  04/18/2018   IR VENOGRAM HEPATIC W HEMODYNAMIC EVALUATION  04/18/2018   PLEURAL EFFUSION DRAINAGE Right 07/22/2019   Procedure: Drainage Of Pleural Effusion;  Surgeon: Lajuana Matte, MD;  Location: [facility];  Service: Thoracic;  Laterality: Right;   POLYPECTOMY  12/04/2019   Procedure: POLYPECTOMY;  Surgeon: Rogene Houston, MD;  [facility];  Service: Endoscopy;;  hepatic flexure, cecal, proximal transverse colon   SHOULDER ARTHROSCOPY WITH ROTATOR CUFF REPAIR Right 08/04/2021   Procedure: SHOULDER ARTHROSCOPY WITH ROTATOR  CUFF REPAIR,EXTENSIVE DEBRIDEMENT, SUBACROMIAL DECOMPRESSION,DISTAL CLAVICLE RESCTION;  Surgeon: Nicholes Stairs, MD;  [facility];  Service: Orthopedics;  Laterality: Right;  139mn   VIDEO ASSISTED THORACOSCOPY (VATS)/DECORTICATION Right 07/22/2019   Procedure: VIDEO ASSISTED THORACOSCOPY (VATS)/DECORTICATION;  Surgeon: LLajuana Matte MD;  [place] [facility]  Service: Thoracic;  Laterality: Right;     There were no vitals filed for this visit.   Subjective Assessment - 09/05/21 1459     Subjective  S: It's doing ok.    Currently in Pain? No/denies                [facility] 09[facility]21 100[facility]       Assessment   Medical Diagnosis s/p right RCR, extensive debridement, SAD, DCR      Precautions   Precautions Shoulder    Type of Shoulder Precautions RTC protocol. See media tab    Shoulder Interventions Shoulder sling/immobilizer;Shoulder abduction pillow;Off for dressing/bathing/exercises                      OT Treatments/Exercises (OP) - 09/05/21 1009       Exercises   Exercises Shoulder      Shoulder Exercises: Supine   Protraction PROM;5 reps    Horizontal ABduction PROM;5 reps    External Rotation PROM;5 reps    Internal Rotation PROM;5 reps    Flexion PROM;5 reps    ABduction PROM;5 reps      Shoulder Exercises: Seated   Extension AROM;12 reps    Row AROM;12 reps      Shoulder Exercises: Therapy Ball   Flexion --   12X; 2" hold at end stretch   ABduction --   12X; 2" hold at end stretch     Shoulder Exercises: ROM/Strengthening   Thumb Tacks 1' low level    Prot/Ret//Elev/Dep 1'      Shoulder Exercises: Isometric Strengthening   Flexion Supine;3X5"    Extension Supine;3X5"    External Rotation Supine;3X5"    Internal Rotation Supine;3X5"    ABduction Supine;3X5"    ADduction Supine;3X5"      Manual Therapy   Manual Therapy Myofascial release    Manual therapy comments Manual therapy completed prior to exercises.    Myofascial Release Myofascial release and manual stretching completed to right upper arm, upper trapezius, and scapularis region to decrease fascial restrictions and increase joint mobility in a pain free zone.                      OT Short Term Goals - 08/16/21 1437       OT SHORT TERM GOAL #1   Title Pt will be provided with and educated on HEP to improve mobility required to complete ADLs.     Time 4    Period Weeks    Status On-going    Target Date 09/10/21      OT SHORT TERM GOAL #2   Title Pt will [facility] to perform dressing tasks with less compensatory strategies.    Time 4    Period Weeks    Status On-going      OT SHORT TERM GOAL #3   Title Pt will increase RUE strength to 3/5 to improve ability to reach items at waist to chest height.    Time 4    Period Weeks    Status On-going  OT Long Term Goals - 08/16/21 1437       OT LONG TERM GOAL #1   Title Pt will decrease pain in RUE to 3/10 or less to improve ability to sleep for 3+ hours without waking due to pain.    Time 8    Period Weeks    Status On-going      OT LONG TERM GOAL #2   Title Pt will decrease RUE fascial restrictions to minimal amounts or less to improve ability to perform overhead reaching tasks.    Time 8    Period Weeks    Status On-going      OT LONG TERM GOAL #3   Title Pt will increase RUE A/ROM to [facility] to improve ability to perform dressing and bathing tasks.    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    Time 8    Period Weeks    Status On-going                   Plan - 09/05/21 1502     Clinical Impression Statement A: Myofascial release completed to address min fascial restrictions located in the right upper arm and upper trapezius region. Only able to tolerate passive flexion and abduction to approximately 90 degrees. completed pro/ret/elev/dep from shoulder level with min difficulty. Muscle fatigue noted at end of new exercise.    Body Structure / Function / Physical Skills ADL;Endurance;Muscle spasms;UE functional use;Fascial restriction;Pain;ROM;IADL;Strength    Plan P: Continue to follow protocol. Gentle myofascial release as able to tolerate. Work on increasing ROM  tolerance within pain level.    Consulted and Agree with Plan of Care Patient             Patient will benefit from skilled therapeutic intervention in order to improve the following deficits and impairments:   Body Structure / Function / Physical Skills: ADL, Endurance, Muscle spasms, UE functional use, Fascial restriction, Pain, ROM, IADL, Strength       Visit Diagnosis: Other symptoms and signs involving the musculoskeletal system  Stiffness of right shoulder, not elsewhere classified  Acute pain of right shoulder    Problem List Patient Active Problem List   Diagnosis Date Noted   Empyema lung ([place]) 07/20/2019   Acute respiratory failure with hypoxia ([place]) 07/20/2019   Empyema ([place]) 07/20/2019   Acute respiratory distress 07/19/2019   Pleural effusion 07/19/2019   Gastric ulcer 09/22/2018   Other cirrhosis of liver ([place]) 08/07/2018   Diabetes ([place])    Cirrhosis of liver with ascites [facility])     Ailene Ravel, OTR/L,CBIS  [phone]  09/05/2021, 3:06 PM  [place], [place], [address] Phone: [phone]   Fax:  [phone]  Name: ANTAVION BARTOSZEK MRN: [id number] Date of Birth: [birth year]

## 2021-09-07 ENCOUNTER — Other Ambulatory Visit: Payer: Self-pay

## 2021-09-07 ENCOUNTER — Ambulatory Visit (HOSPITAL_COMMUNITY): Payer: No Typology Code available for payment source | Attending: Orthopedic Surgery | Admitting: Occupational Therapy

## 2021-09-07 ENCOUNTER — Encounter (HOSPITAL_COMMUNITY): Payer: Self-pay | Admitting: Occupational Therapy

## 2021-09-07 DIAGNOSIS — R29898 Other symptoms and signs involving the musculoskeletal system: Secondary | ICD-10-CM | POA: Insufficient documentation

## 2021-09-07 DIAGNOSIS — M25611 Stiffness of right shoulder, not elsewhere classified: Secondary | ICD-10-CM | POA: Insufficient documentation

## 2021-09-07 DIAGNOSIS — M25511 Pain in right shoulder: Secondary | ICD-10-CM | POA: Diagnosis present

## 2021-09-07 NOTE — Therapy (Signed)
[place], [place], [address] Phone: [phone]   Fax:  [phone]  Occupational Therapy Treatment Mini-reassessment  Patient Details  Name: Lawrence Rogers MRN: [id number] Date of Birth: July 12, 1961 Referring Provider (OT): Dr. Victorino December   Encounter Date: 09/07/2021   OT End of Session - 09/07/21 0807     Visit Number 8    Number of Visits 16    Date for OT Re-Evaluation 10/10/21    Authorization Type Worker's Comp    Authorization Time Period 12 visits approved    Authorization - Visit Number 8    Authorization - Number of Visits 12    OT Start Time 0720    OT Stop Time 0805    OT Time Calculation (min) 45 min    Activity Tolerance Patient tolerated treatment well    Behavior During Therapy [facility] for tasks assessed/performed             Past Medical History:  Diagnosis Date   Arthritis    Cirrhosis ([place])    Depression    Diabetes ([place])     Past Surgical History:  Procedure Laterality Date   CATARACT EXTRACTION W/PHACO Left 10/26/2019   Procedure: CATARACT EXTRACTION PHACO AND INTRAOCULAR LENS PLACEMENT LEFT EYE CDE=5.20;  Surgeon: Baruch Goldmann, MD;  [facility];  Service: Ophthalmology;  Laterality: Left;  left   CATARACT EXTRACTION W/PHACO Right 11/09/2019   Procedure: CATARACT EXTRACTION PHACO AND INTRAOCULAR LENS PLACEMENT (IOC);  Surgeon: Baruch Goldmann, MD;  [facility];  Service: Ophthalmology;  Laterality: Right;  CDE: 3.03   COLONOSCOPY WITH PROPOFOL N/A 12/04/2019   Procedure: COLONOSCOPY WITH PROPOFOL;  Surgeon: Rogene Houston, MD;  [facility];  Service: Endoscopy;  Laterality: N/A;   ESOPHAGEAL BANDING N/A 08/29/2018   Procedure: ESOPHAGEAL BANDING;  Surgeon: Rogene Houston, MD;  [facility];  Service: Endoscopy;  Laterality: N/A;   ESOPHAGEAL BANDING  10/24/2018   Procedure: ESOPHAGEAL BANDING;  Surgeon: Rogene Houston, MD;  [facility];  Service: Endoscopy;;  3 bands   ESOPHAGEAL BANDING  12/04/2019   Procedure: ESOPHAGEAL BANDING;  Surgeon: Rogene Houston, MD;  [facility];  Service: Endoscopy;;   ESOPHAGOGASTRODUODENOSCOPY (EGD) WITH PROPOFOL N/A 08/29/2018   Procedure: ESOPHAGOGASTRODUODENOSCOPY (EGD) WITH PROPOFOL;  Surgeon: Rogene Houston, MD;  [facility];  Service: Endoscopy;  Laterality: N/A;  9:30   ESOPHAGOGASTRODUODENOSCOPY (EGD) WITH PROPOFOL N/A 10/24/2018   Procedure: ESOPHAGOGASTRODUODENOSCOPY (EGD) WITH PROPOFOL;  Surgeon: Rogene Houston, MD;  [facility];  Service: Endoscopy;  Laterality: N/A;  7:30   ESOPHAGOGASTRODUODENOSCOPY (EGD) WITH PROPOFOL N/A 12/04/2019   Procedure: ESOPHAGOGASTRODUODENOSCOPY (EGD) WITH PROPOFOL;  Surgeon: Rogene Houston, MD;  [facility];  Service: Endoscopy;  Laterality: N/A;   IR PARACENTESIS  04/18/2018   IR TRANSCATHETER BX  04/18/2018   IR US GUIDE VASC ACCESS RIGHT  04/18/2018   IR VENOGRAM HEPATIC W HEMODYNAMIC EVALUATION  04/18/2018   PLEURAL EFFUSION DRAINAGE Right 07/22/2019   Procedure: Drainage Of Pleural Effusion;  Surgeon: Lajuana Matte, MD;  Location: [facility];  Service: Thoracic;  Laterality: Right;   POLYPECTOMY  12/04/2019   Procedure: POLYPECTOMY;  Surgeon: Rogene Houston, MD;  [facility];  Service: Endoscopy;;  hepatic flexure, cecal, proximal transverse colon   SHOULDER ARTHROSCOPY WITH ROTATOR CUFF REPAIR Right 08/04/2021   Procedure: SHOULDER ARTHROSCOPY WITH ROTATOR CUFF REPAIR,EXTENSIVE  DEBRIDEMENT, SUBACROMIAL DECOMPRESSION,DISTAL CLAVICLE RESCTION;  Surgeon: Nicholes Stairs, MD;  [facility];  Service: Orthopedics;  Laterality: Right;  135mn   VIDEO ASSISTED THORACOSCOPY (VATS)/DECORTICATION Right 07/22/2019   Procedure: VIDEO ASSISTED THORACOSCOPY (VATS)/DECORTICATION;  Surgeon: LLajuana Matte MD;  [place]  Service: Thoracic;  Laterality: Right;    There  were no vitals filed for this visit.   Subjective Assessment - 09/07/21 0722     Subjective  S: I woke up with it really sore today.    Currently in Pain? Yes    Pain Score 5     Pain Location Shoulder    Pain Orientation Right    Pain Descriptors / Indicators Sore    Pain Type Acute pain    Pain Radiating Towards None    Pain Onset More than a month ago    Pain Frequency Intermittent    Aggravating Factors  woke up like this    Pain Relieving Factors pain medication, heat    Effect of Pain on Daily Activities unable to use RUE for ADLs    Multiple Pain Sites No                [facility] OT Assessment - 09/07/21 0722       Assessment   Medical Diagnosis s/p right RCR, extensive debridement, SAD, DCR      Precautions   Precautions Shoulder    Type of Shoulder Precautions RTC protocol. See media tab    Shoulder Interventions Shoulder sling/immobilizer;Shoulder abduction pillow;Off for dressing/bathing/exercises      AROM   Overall AROM  Deficits;Due to precautions;Due to pain;Unable to assess      PROM   Overall PROM Comments Assessed supine, er/IR adducted    PROM Assessment Site Shoulder    Right/Left Shoulder Right    Right Shoulder Flexion 96 Degrees   84 previous   Right Shoulder ABduction 86 Degrees   82 previous   Right Shoulder Internal Rotation 90 Degrees   same as previous   Right Shoulder External Rotation 26 Degrees   -3 previous     Strength   Overall Strength Due to precautions;Due to pain;Unable to assess;Deficits                      OT Treatments/Exercises (OP) - 09/07/21 0724       Exercises   Exercises Shoulder      Shoulder Exercises: Supine   Protraction PROM;5 reps    Horizontal ABduction PROM;5 reps    External Rotation PROM;5 reps    Internal Rotation PROM;5 reps    Flexion PROM;5 reps    ABduction PROM;5 reps      Shoulder Exercises: Seated   Extension AROM;12 reps    Row AROM;12 reps      Shoulder Exercises:  Therapy Ball   Flexion 10 reps    ABduction 10 reps      Modalities   Modalities Electrical Stimulation;Moist Heat      Moist Heat Therapy   Number Minutes Moist Heat 10 Minutes    Moist Heat Location Shoulder      Electrical Stimulation   Electrical Stimulation Location right shoulder    Electrical Stimulation Action interferential    Electrical Stimulation Parameters 16.0 CV    Electrical Stimulation Goals Pain      Manual Therapy   Manual Therapy Myofascial release    Manual therapy comments Manual therapy completed prior to exercises.    Myofascial Release  Myofascial release and manual stretching completed to right upper arm, upper trapezius, and scapularis region to decrease fascial restrictions and increase joint mobility in a pain free zone.                      OT Short Term Goals - 08/16/21 1437       OT SHORT TERM GOAL #1   Title Pt will be provided with and educated on HEP to improve mobility required to complete ADLs.    Time 4    Period Weeks    Status On-going    Target Date 09/10/21      OT SHORT TERM GOAL #2   Title Pt will increase P/ROM in RUE to [facility] to improve ability to perform dressing tasks with less compensatory strategies.    Time 4    Period Weeks    Status On-going      OT SHORT TERM GOAL #3   Title Pt will increase RUE strength to 3/5 to improve ability to reach items at waist to chest height.    Time 4    Period Weeks    Status On-going               OT Long Term Goals - 08/16/21 1437       OT LONG TERM GOAL #1   Title Pt will decrease pain in RUE to 3/10 or less to improve ability to sleep for 3+ hours without waking due to pain.    Time 8    Period Weeks    Status On-going      OT LONG TERM GOAL #2   Title Pt will decrease RUE fascial restrictions to minimal amounts or less to improve ability to perform overhead reaching tasks.    Time 8    Period Weeks    Status On-going      OT LONG TERM GOAL #3   Title  Pt will increase RUE A/ROM to [facility] to improve ability to perform dressing and bathing tasks.    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    Time 8    Period Weeks    Status On-going                   Plan - 09/07/21 0744     Clinical Impression Statement A: Pt reports soreness today and is limited in activity tolerance due to increased pain. Mini-reassessment completed this session, pt limited due to pain today, has improved ROM in all ranges and is progressing slowly towards goals. Continued with myofascial release to address fascial restrictions, passive stretching. Continued with scapular A/ROM and therapy ball stretches. ES trialed at end of session for pain management, pt reporting pain at 2/10 after use. Verbal cuing for form and technique.    Body Structure / Function / Physical Skills ADL;Endurance;Muscle spasms;UE functional use;Fascial restriction;Pain;ROM;IADL;Strength    Plan P: Continue with protocol and working to improve tolerance and pain level with passive stretching, resume thumb tacks and prot/ret/elev/dep at low level    OT Home Exercise Plan eval: table slides    Consulted and Agree with Plan of Care Patient             Patient will benefit from skilled therapeutic intervention in  order to improve the following deficits and impairments:   Body Structure / Function / Physical Skills: ADL, Endurance, Muscle spasms, UE functional use, Fascial restriction, Pain, ROM, IADL, Strength       Visit Diagnosis: Other symptoms and signs involving the musculoskeletal system  Stiffness of right shoulder, not elsewhere classified  Acute pain of right shoulder    Problem List Patient Active Problem List   Diagnosis Date Noted   Empyema lung ([place]) 07/20/2019   Acute respiratory failure with hypoxia ([place]) 07/20/2019   Empyema  ([place]) 07/20/2019   Acute respiratory distress 07/19/2019   Pleural effusion 07/19/2019   Gastric ulcer 09/22/2018   Other cirrhosis of liver ([place]) 08/07/2018   Diabetes ([place])    Cirrhosis of liver with ascites [facility])     Guadelupe Sabin, OTR/L  [phone] 09/07/2021, 8:08 AM  [place], [place], [address] Phone: [phone]   Fax:  [phone]  Name: ORYON GARY MRN: [id number] Date of Birth: [birth year]

## 2021-09-08 ENCOUNTER — Encounter (HOSPITAL_COMMUNITY): Payer: Managed Care, Other (non HMO) | Admitting: Occupational Therapy

## 2021-09-12 ENCOUNTER — Ambulatory Visit (HOSPITAL_COMMUNITY): Payer: No Typology Code available for payment source

## 2021-09-12 ENCOUNTER — Encounter (HOSPITAL_COMMUNITY): Payer: Self-pay

## 2021-09-12 ENCOUNTER — Telehealth (HOSPITAL_COMMUNITY): Payer: Self-pay

## 2021-09-12 ENCOUNTER — Other Ambulatory Visit: Payer: Self-pay

## 2021-09-12 DIAGNOSIS — M25611 Stiffness of right shoulder, not elsewhere classified: Secondary | ICD-10-CM

## 2021-09-12 DIAGNOSIS — R29898 Other symptoms and signs involving the musculoskeletal system: Secondary | ICD-10-CM | POA: Diagnosis not present

## 2021-09-12 DIAGNOSIS — M25511 Pain in right shoulder: Secondary | ICD-10-CM

## 2021-09-12 NOTE — Therapy (Signed)
[facility] [address] [place], [place], [address] Phone: [phone]   Fax:  [phone]  Occupational Therapy Treatment  Patient Details  Name: Lawrence Rogers MRN: [id number] Date of Birth: September 20, 1961 Referring Provider (OT): Dr. Victorino December   Encounter Date: 09/12/2021   OT End of Session - 09/12/21 1016     Visit Number 9    Number of Visits 16    Date for OT Re-Evaluation 10/10/21    Authorization Type Worker's Comp    Authorization Time Period 12 visits approved    Authorization - Visit Number 9    Authorization - Number of Visits 12    OT Start Time 0945    OT Stop Time 1024    OT Time Calculation (min) 39 min    Activity Tolerance Patient tolerated treatment well    Behavior During Therapy [facility] for tasks assessed/performed             Past Medical History:  Diagnosis Date   Arthritis    Cirrhosis ([place])    Depression    Diabetes ([place])     Past Surgical History:  Procedure Laterality Date   CATARACT EXTRACTION W/PHACO Left 10/26/2019   Procedure: CATARACT EXTRACTION PHACO AND INTRAOCULAR LENS PLACEMENT LEFT EYE CDE=5.20;  Surgeon: Baruch Goldmann, MD;  [facility]: [facility];  Service: Ophthalmology;  Laterality: Left;  left   CATARACT EXTRACTION W/PHACO Right 11/09/2019   Procedure: CATARACT EXTRACTION PHACO AND INTRAOCULAR LENS PLACEMENT (IOC);  Surgeon: Baruch Goldmann, MD;  Location: [facility];  Service: Ophthalmology;  Laterality: Right;  CDE: 3.03   COLONOSCOPY WITH PROPOFOL N/A 12/04/2019   Procedure: COLONOSCOPY WITH PROPOFOL;  Surgeon: Rogene Houston, MD;  [facility];  Service: Endoscopy;  Laterality: N/A;   ESOPHAGEAL BANDING N/A 08/29/2018   Procedure: ESOPHAGEAL BANDING;  Surgeon: Rogene Houston, MD;  [facility];  Service: Endoscopy;  Laterality: N/A;   ESOPHAGEAL BANDING  10/24/2018   Procedure: ESOPHAGEAL BANDING;  Surgeon: Rogene Houston, MD;  [facility];  Service:  Endoscopy;;  3 bands   ESOPHAGEAL BANDING  12/04/2019   Procedure: ESOPHAGEAL BANDING;  Surgeon: Rogene Houston, MD;  [facility];  Service: Endoscopy;;   ESOPHAGOGASTRODUODENOSCOPY (EGD) WITH PROPOFOL N/A 08/29/2018   Procedure: ESOPHAGOGASTRODUODENOSCOPY (EGD) WITH PROPOFOL;  Surgeon: Rogene Houston, MD;  [facility];  Service: Endoscopy;  Laterality: N/A;  9:30   ESOPHAGOGASTRODUODENOSCOPY (EGD) WITH PROPOFOL N/A 10/24/2018   Procedure: ESOPHAGOGASTRODUODENOSCOPY (EGD) WITH PROPOFOL;  Surgeon: Rogene Houston, MD;  [facility];  Service: Endoscopy;  Laterality: N/A;  7:30   ESOPHAGOGASTRODUODENOSCOPY (EGD) WITH PROPOFOL N/A 12/04/2019   Procedure: ESOPHAGOGASTRODUODENOSCOPY (EGD) WITH PROPOFOL;  Surgeon: Rogene Houston, MD;  [facility];  Service: Endoscopy;  Laterality: N/A;   IR PARACENTESIS  04/18/2018   IR TRANSCATHETER BX  04/18/2018   IR US GUIDE VASC ACCESS RIGHT  04/18/2018   IR VENOGRAM HEPATIC W HEMODYNAMIC EVALUATION  04/18/2018   PLEURAL EFFUSION DRAINAGE Right 07/22/2019   Procedure: Drainage Of Pleural Effusion;  Surgeon: Lajuana Matte, MD;  Location: [facility];  Service: Thoracic;  Laterality: Right;   POLYPECTOMY  12/04/2019   Procedure: POLYPECTOMY;  Surgeon: Rogene Houston, MD;  [facility];  Service: Endoscopy;;  hepatic flexure, cecal, proximal transverse colon   SHOULDER ARTHROSCOPY WITH ROTATOR CUFF REPAIR Right 08/04/2021   Procedure: SHOULDER ARTHROSCOPY WITH ROTATOR CUFF REPAIR,EXTENSIVE DEBRIDEMENT,  SUBACROMIAL DECOMPRESSION,DISTAL CLAVICLE RESCTION;  Surgeon: Nicholes Stairs, MD;  [facility];  Service: Orthopedics;  Laterality: Right;  167mn   VIDEO ASSISTED THORACOSCOPY (VATS)/DECORTICATION Right 07/22/2019   Procedure: VIDEO ASSISTED THORACOSCOPY (VATS)/DECORTICATION;  Surgeon: LLajuana Matte MD;  [place] [facility] [place]  Service: Thoracic;  Laterality: Right;    There were no vitals filed for  this visit.   Subjective Assessment - 09/12/21 0952     Subjective  S: It's just really sore.    Currently in Pain? Yes    Pain Score 4     Pain Location Shoulder    Pain Orientation Right    Pain Descriptors / Indicators Sore    Pain Type Acute pain    Pain Onset More than a month ago    Pain Frequency Constant    Aggravating Factors  woke up like this    Pain Relieving Factors pain medication and heat    Effect of Pain on Daily Activities unable to use RUE for ADL tasks.    Multiple Pain Sites No                [facility] OT Assessment - 09/12/21 1007       Assessment   Medical Diagnosis s/p right RCR, extensive debridement, SAD, DCR      Precautions   Precautions Shoulder    Type of Shoulder Precautions RTC protocol. See media tab    Shoulder Interventions Shoulder sling/immobilizer;Shoulder abduction pillow;Off for dressing/bathing/exercises                      OT Treatments/Exercises (OP) - 09/12/21 1004       Exercises   Exercises Shoulder      Shoulder Exercises: Supine   Protraction PROM;5 reps    Horizontal ABduction PROM;5 reps    External Rotation PROM;5 reps    Internal Rotation PROM;5 reps    Flexion PROM;5 reps    ABduction PROM;5 reps      Shoulder Exercises: Seated   Other Seated Exercises Therapy slides: IR/er 15X      Shoulder Exercises: Therapy Ball   Other Therapy Ball Exercises Table slides: 10X flexion 2" hold at end stretch      Shoulder Exercises: ROM/Strengthening   Thumb Tacks 1' low level    Prot/Ret//Elev/Dep 1'      Shoulder Exercises: Isometric Strengthening   Flexion --   3x10" standing   Extension --   standing 3x10"   External Rotation --   standing 3x10"   Internal Rotation --   standing 3x10"   ABduction --   standing 3x10"   ADduction --   standing 3x10"     Manual Therapy   Manual Therapy Myofascial release    Manual therapy comments Manual therapy completed prior to exercises.    Myofascial Release  Myofascial release and manual stretching completed to right upper arm, upper trapezius, and scapularis region to decrease fascial restrictions and increase joint mobility in a pain free zone.                      OT Short Term Goals - 08/16/21 1437       OT SHORT TERM GOAL #1   Title Pt will be provided with and educated on HEP to improve mobility required to complete ADLs.    Time 4    Period Weeks    Status On-going    Target Date 09/10/21  OT SHORT TERM GOAL #2   Title Pt will increase P/ROM in RUE to [facility] to improve ability to perform dressing tasks with less compensatory strategies.    Time 4    Period Weeks    Status On-going      OT SHORT TERM GOAL #3   Title Pt will increase RUE strength to 3/5 to improve ability to reach items at waist to chest height.    Time 4    Period Weeks    Status On-going               OT Long Term Goals - 08/16/21 1437       OT LONG TERM GOAL #1   Title Pt will decrease pain in RUE to 3/10 or less to improve ability to sleep for 3+ hours without waking due to pain.    Time 8    Period Weeks    Status On-going      OT LONG TERM GOAL #2   Title Pt will decrease RUE fascial restrictions to minimal amounts or less to improve ability to perform overhead reaching tasks.    Time 8    Period Weeks    Status On-going      OT LONG TERM GOAL #3   Title Pt will increase RUE A/ROM to [facility] to improve ability to perform dressing and bathing tasks.    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    Time 8    Period Weeks    Status On-going                   Plan - 09/12/21 1007     Clinical Impression Statement A: Manual techniques completed to address fascial restrictions located in the right upper arm and upper trapezius. Completed isometric standing using wall. Provided VC for  form and technique. Continues to be limited by pain which prevents him from tolerating more ROM.    Body Structure / Function / Physical Skills ADL;Endurance;Muscle spasms;UE functional use;Fascial restriction;Pain;ROM;IADL;Strength    Plan P: Attempt pulleys and PVC pipe slide.    Consulted and Agree with Plan of Care Patient             Patient will benefit from skilled therapeutic intervention in order to improve the following deficits and impairments:   Body Structure / Function / Physical Skills: ADL, Endurance, Muscle spasms, UE functional use, Fascial restriction, Pain, ROM, IADL, Strength       Visit Diagnosis: Acute pain of right shoulder  Other symptoms and signs involving the musculoskeletal system  Stiffness of right shoulder, not elsewhere classified    Problem List Patient Active Problem List   Diagnosis Date Noted   Empyema lung ([place]) 07/20/2019   Acute respiratory failure with hypoxia ([place]) 07/20/2019   Empyema ([place]) 07/20/2019   Acute respiratory distress 07/19/2019   Pleural effusion 07/19/2019   Gastric ulcer 09/22/2018   Other cirrhosis of liver ([place]) 08/07/2018   Diabetes ([place])    Cirrhosis of liver with ascites [facility])     Ailene Ravel, OTR/L,CBIS  [phone]  09/12/2021, 10:27 AM  [place], [place], [address] Phone: [phone]   Fax:  [phone]  Name: Lawrence Rogers MRN: [id number] Date  of Birth: 07/16/1961

## 2021-09-12 NOTE — Telephone Encounter (Signed)
Request sent for more OT visit to be approved. Lawrence Rogers said the new auth should be faxed by Friday 10/15/21. [name] requested progress notes also.

## 2021-09-14 ENCOUNTER — Encounter (HOSPITAL_COMMUNITY): Payer: Self-pay | Admitting: Occupational Therapy

## 2021-09-14 ENCOUNTER — Ambulatory Visit (HOSPITAL_COMMUNITY): Payer: No Typology Code available for payment source | Attending: Orthopedic Surgery | Admitting: Occupational Therapy

## 2021-09-14 ENCOUNTER — Other Ambulatory Visit: Payer: Self-pay

## 2021-09-14 DIAGNOSIS — M25611 Stiffness of right shoulder, not elsewhere classified: Secondary | ICD-10-CM | POA: Diagnosis present

## 2021-09-14 DIAGNOSIS — M25511 Pain in right shoulder: Secondary | ICD-10-CM | POA: Insufficient documentation

## 2021-09-14 DIAGNOSIS — R29898 Other symptoms and signs involving the musculoskeletal system: Secondary | ICD-10-CM | POA: Diagnosis present

## 2021-09-14 NOTE — Therapy (Signed)
[facility] [address] [place], [place], [address] Phone: [phone]   Fax:  [phone]  Occupational Therapy Treatment  Patient Details  Name: Lawrence Rogers MRN: [id number] Date of Birth: October 17, 1961 Referring Provider (OT): Dr. Victorino December   Encounter Date: 09/14/2021   OT End of Session - 09/14/21 0856     Visit Number 10    Number of Visits 16    Date for OT Re-Evaluation 10/10/21    Authorization Type Worker's Comp    Authorization Time Period 12 visits approved    Authorization - Visit Number 10    Authorization - Number of Visits 12    OT Start Time 0813    OT Stop Time 7253    OT Time Calculation (min) 41 min    Activity Tolerance Patient tolerated treatment well    Behavior During Therapy [facility] for tasks assessed/performed             Past Medical History:  Diagnosis Date   Arthritis    Cirrhosis ([place])    Depression    Diabetes ([place])     Past Surgical History:  Procedure Laterality Date   CATARACT EXTRACTION W/PHACO Left 10/26/2019   Procedure: CATARACT EXTRACTION PHACO AND INTRAOCULAR LENS PLACEMENT LEFT EYE CDE=5.20;  Surgeon: Baruch Goldmann, MD;  [facility];  Service: Ophthalmology;  Laterality: Left;  left   CATARACT EXTRACTION W/PHACO Right 11/09/2019   Procedure: CATARACT EXTRACTION PHACO AND INTRAOCULAR LENS PLACEMENT (IOC);  Surgeon: Baruch Goldmann, MD;  [facility];  Service: Ophthalmology;  Laterality: Right;  CDE: 3.03   COLONOSCOPY WITH PROPOFOL N/A 12/04/2019   Procedure: COLONOSCOPY WITH PROPOFOL;  Surgeon: Rogene Houston, MD;  [facility];  Service: Endoscopy;  Laterality: N/A;   ESOPHAGEAL BANDING N/A 08/29/2018   Procedure: ESOPHAGEAL BANDING;  Surgeon: Rogene Houston, MD;  [facility];  Service: Endoscopy;  Laterality: N/A;   ESOPHAGEAL BANDING  10/24/2018   Procedure: ESOPHAGEAL BANDING;  Surgeon: Rogene Houston, MD;  [facility];   Service: Endoscopy;;  3 bands   ESOPHAGEAL BANDING  12/04/2019   Procedure: ESOPHAGEAL BANDING;  Surgeon: Rogene Houston, MD;  [facility];  Service: Endoscopy;;   ESOPHAGOGASTRODUODENOSCOPY (EGD) WITH PROPOFOL N/A 08/29/2018   Procedure: ESOPHAGOGASTRODUODENOSCOPY (EGD) WITH PROPOFOL;  Surgeon: Rogene Houston, MD;  [facility];  Service: Endoscopy;  Laterality: N/A;  9:30   ESOPHAGOGASTRODUODENOSCOPY (EGD) WITH PROPOFOL N/A 10/24/2018   Procedure: ESOPHAGOGASTRODUODENOSCOPY (EGD) WITH PROPOFOL;  Surgeon: Rogene Houston, MD;  [facility];  Service: Endoscopy;  Laterality: N/A;  7:30   ESOPHAGOGASTRODUODENOSCOPY (EGD) WITH PROPOFOL N/A 12/04/2019   Procedure: ESOPHAGOGASTRODUODENOSCOPY (EGD) WITH PROPOFOL;  Surgeon: Rogene Houston, MD;  [facility];  Service: Endoscopy;  Laterality: N/A;   IR PARACENTESIS  04/18/2018   IR TRANSCATHETER BX  04/18/2018   IR US GUIDE VASC ACCESS RIGHT  04/18/2018   IR VENOGRAM HEPATIC W HEMODYNAMIC EVALUATION  04/18/2018   PLEURAL EFFUSION DRAINAGE Right 07/22/2019   Procedure: Drainage Of Pleural Effusion;  Surgeon: Lajuana Matte, MD;  Location: [facility];  Service: Thoracic;  Laterality: Right;   POLYPECTOMY  12/04/2019   Procedure: POLYPECTOMY;  Surgeon: Rogene Houston, MD;  [facility];  Service: Endoscopy;;  hepatic flexure, cecal, proximal transverse colon   SHOULDER ARTHROSCOPY WITH ROTATOR CUFF REPAIR Right 08/04/2021   Procedure: SHOULDER ARTHROSCOPY WITH ROTATOR CUFF REPAIR,EXTENSIVE DEBRIDEMENT,  SUBACROMIAL DECOMPRESSION,DISTAL CLAVICLE RESCTION;  Surgeon: Nicholes Stairs, MD;  [facility];  Service: Orthopedics;  Laterality: Right;  132mn   VIDEO ASSISTED THORACOSCOPY (VATS)/DECORTICATION Right 07/22/2019   Procedure: VIDEO ASSISTED THORACOSCOPY (VATS)/DECORTICATION;  Surgeon: LLajuana Matte MD;  [place] [facility]  Service: Thoracic;  Laterality: Right;    There were no vitals  filed for this visit.   Subjective Assessment - 09/14/21 0811     Subjective  S: It's feeling alright today.    Currently in Pain? No/denies                [facility]21 08[facility]       Assessment   Medical Diagnosis s/p right RCR, extensive debridement, SAD, DCR      Precautions   Precautions Shoulder    Type of Shoulder Precautions RTC protocol. See media tab    Shoulder Interventions Shoulder sling/immobilizer;Shoulder abduction pillow;Off for dressing/bathing/exercises                      OT Treatments/Exercises (OP) - 09/14/21 0815       Exercises   Exercises Shoulder      Shoulder Exercises: Supine   Protraction PROM;5 reps    Horizontal ABduction PROM;5 reps    External Rotation PROM;5 reps    Internal Rotation PROM;5 reps    Flexion PROM;5 reps    ABduction PROM;5 reps      Shoulder Exercises: Seated   Extension AROM;12 reps    Row AROM;12 reps      Shoulder Exercises: Pulleys   Flexion 1 minute    ABduction 1 minute      Shoulder Exercises: ROM/Strengthening   Other ROM/Strengthening Exercises PVC pipe slide, 10X flexion      Shoulder Exercises: Isometric Strengthening   Flexion --   3x10" standing   Extension --   3x10" standing   External Rotation --   3x10" standing   Internal Rotation --   3x10" standing   ABduction --   3x10" standing   ADduction --   3x10" standing     Manual Therapy   Manual Therapy Myofascial release    Manual therapy comments Manual therapy completed prior to exercises.    Myofascial Release Myofascial release and manual stretching completed to right upper arm, upper trapezius, and scapularis region to decrease fascial restrictions and increase joint mobility in a pain free zone.                      OT Short Term Goals - 08/16/21 1437       OT SHORT TERM GOAL #1   Title Pt will be provided with and educated on HEP to improve mobility required to complete ADLs.    Time 4     Period Weeks    Status On-going    Target Date 09/10/21      OT SHORT TERM GOAL #2   Title Pt will [facility] improve ability to perform dressing tasks with less compensatory strategies.    Time 4    Period Weeks    Status On-going      OT SHORT TERM GOAL #3   Title Pt will increase RUE strength to 3/5 to improve ability to reach items at waist to chest height.    Time 4    Period Weeks    Status On-going  OT Long Term Goals - 08/16/21 1437       OT LONG TERM GOAL #1   Title Pt will decrease pain in RUE to 3/10 or less to improve ability to sleep for 3+ hours without waking due to pain.    Time 8    Period Weeks    Status On-going      OT LONG TERM GOAL #2   Title Pt will decrease RUE fascial restrictions to minimal amounts or less to improve ability to perform overhead reaching tasks.    Time 8    Period Weeks    Status On-going      OT LONG TERM GOAL #3   Title Pt will increase RUE A/ROM to [facility] to improve ability to perform dressing and bathing tasks.    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    Time 8    Period Weeks    Status On-going                   Plan - 09/14/21 2229     Clinical Impression Statement A: Continued with myofascial release to address fascial restrictions, passive stretching. Pt is limited by pain at approximately 50% ROM during passive stretching. Continued with isometrics, added pvc pipe slide and pulleys today, pt able to tolerate greater ROM with AA/ROM versus passive stretching. Verbal cuing for form and technique.    Body Structure / Function / Physical Skills ADL;Endurance;Muscle spasms;UE functional use;Fascial restriction;Pain;ROM;IADL;Strength    Plan P: Follow up on MD appt, progress to phase II of protocol and attempt AA/ROM in supine    OT Home  Exercise Plan eval: table slides    Consulted and Agree with Plan of Care Patient             Patient will benefit from skilled therapeutic intervention in order to improve the following deficits and impairments:   Body Structure / Function / Physical Skills: ADL, Endurance, Muscle spasms, UE functional use, Fascial restriction, Pain, ROM, IADL, Strength       Visit Diagnosis: Acute pain of right shoulder  Other symptoms and signs involving the musculoskeletal system  Stiffness of right shoulder, not elsewhere classified    Problem List Patient Active Problem List   Diagnosis Date Noted   Empyema lung ([place]) 07/20/2019   Acute respiratory failure with hypoxia ([place]) 07/20/2019   Empyema ([place]) 07/20/2019   Acute respiratory distress 07/19/2019   Pleural effusion 07/19/2019   Gastric ulcer 09/22/2018   Other cirrhosis of liver ([place]) 08/07/2018   Diabetes ([place])    Cirrhosis of liver with ascites [facility])    Guadelupe Sabin, OTR/L  [phone] 09/14/2021, 8:56 AM  [place], [place], [address] Phone: [phone]   Fax:  [phone]  Name: Lawrence Rogers MRN: [id number] Date of Birth: 03/04/1961

## 2021-09-15 ENCOUNTER — Encounter (HOSPITAL_COMMUNITY): Payer: Managed Care, Other (non HMO) | Admitting: Occupational Therapy

## 2021-09-19 ENCOUNTER — Encounter (HOSPITAL_COMMUNITY): Payer: Self-pay

## 2021-09-19 ENCOUNTER — Telehealth (HOSPITAL_COMMUNITY): Payer: Self-pay

## 2021-09-19 ENCOUNTER — Ambulatory Visit (HOSPITAL_COMMUNITY): Payer: No Typology Code available for payment source | Attending: Orthopedic Surgery

## 2021-09-19 ENCOUNTER — Other Ambulatory Visit: Payer: Self-pay

## 2021-09-19 DIAGNOSIS — R29898 Other symptoms and signs involving the musculoskeletal system: Secondary | ICD-10-CM

## 2021-09-19 DIAGNOSIS — M25511 Pain in right shoulder: Secondary | ICD-10-CM | POA: Diagnosis present

## 2021-09-19 DIAGNOSIS — M25611 Stiffness of right shoulder, not elsewhere classified: Secondary | ICD-10-CM | POA: Diagnosis not present

## 2021-09-19 NOTE — Therapy (Signed)
[facility] [address] [place], [place], [address] Phone: [phone]   Fax:  [phone]  Occupational Therapy Treatment  Patient Details  Name: Lawrence Rogers MRN: [id number] Date of Birth: 1961-05-09 Referring Provider (OT): Dr. Victorino December   Encounter Date: 09/19/2021   OT End of Session - 09/19/21 1009     Visit Number 11    Number of Visits 16    Date for OT Re-Evaluation 10/10/21    Authorization Type Worker's Comp    Authorization Time Period 12 visits approved    Authorization - Visit Number 11    Authorization - Number of Visits 12    OT Start Time 0945    OT Stop Time 0174    OT Time Calculation (min) 38 min    Activity Tolerance Patient tolerated treatment well    Behavior During Therapy [facility] for tasks assessed/performed             Past Medical History:  Diagnosis Date   Arthritis    Cirrhosis ([place])    Depression    Diabetes ([place])     Past Surgical History:  Procedure Laterality Date   CATARACT EXTRACTION W/PHACO Left 10/26/2019   Procedure: CATARACT EXTRACTION PHACO AND INTRAOCULAR LENS PLACEMENT LEFT EYE CDE=5.20;  Surgeon: Baruch Goldmann, MD;  [facility];  Service: Ophthalmology;  Laterality: Left;  left   CATARACT EXTRACTION W/PHACO Right 11/09/2019   Procedure: CATARACT EXTRACTION PHACO AND INTRAOCULAR LENS PLACEMENT (IOC);  Surgeon: Baruch Goldmann, MD;  [facility]: [facility];  Service: Ophthalmology;  Laterality: Right;  CDE: 3.03   COLONOSCOPY WITH PROPOFOL N/A 12/04/2019   Procedure: COLONOSCOPY WITH PROPOFOL;  Surgeon: Rogene Houston, MD;  [facility];  Service: Endoscopy;  Laterality: N/A;   ESOPHAGEAL BANDING N/A 08/29/2018   Procedure: ESOPHAGEAL BANDING;  Surgeon: Rogene Houston, MD;  [facility];  Service: Endoscopy;  Laterality: N/A;   ESOPHAGEAL BANDING  10/24/2018   Procedure: ESOPHAGEAL BANDING;  Surgeon: Rogene Houston, MD;  [facility];   Service: Endoscopy;;  3 bands   ESOPHAGEAL BANDING  12/04/2019   Procedure: ESOPHAGEAL BANDING;  Surgeon: Rogene Houston, MD;  [facility];  Service: Endoscopy;;   ESOPHAGOGASTRODUODENOSCOPY (EGD) WITH PROPOFOL N/A 08/29/2018   Procedure: ESOPHAGOGASTRODUODENOSCOPY (EGD) WITH PROPOFOL;  Surgeon: Rogene Houston, MD;  [facility];  Service: Endoscopy;  Laterality: N/A;  9:30   ESOPHAGOGASTRODUODENOSCOPY (EGD) WITH PROPOFOL N/A 10/24/2018   Procedure: ESOPHAGOGASTRODUODENOSCOPY (EGD) WITH PROPOFOL;  Surgeon: Rogene Houston, MD;  [facility];  Service: Endoscopy;  Laterality: N/A;  7:30   ESOPHAGOGASTRODUODENOSCOPY (EGD) WITH PROPOFOL N/A 12/04/2019   Procedure: ESOPHAGOGASTRODUODENOSCOPY (EGD) WITH PROPOFOL;  Surgeon: Rogene Houston, MD;  [facility];  Service: Endoscopy;  Laterality: N/A;   IR PARACENTESIS  04/18/2018   IR TRANSCATHETER BX  04/18/2018   IR US GUIDE VASC ACCESS RIGHT  04/18/2018   IR VENOGRAM HEPATIC W HEMODYNAMIC EVALUATION  04/18/2018   PLEURAL EFFUSION DRAINAGE Right 07/22/2019   Procedure: Drainage Of Pleural Effusion;  Surgeon: Lajuana Matte, MD;  Location: [facility];  Service: Thoracic;  Laterality: Right;   POLYPECTOMY  12/04/2019   Procedure: POLYPECTOMY;  Surgeon: Rogene Houston, MD;  [facility];  Service: Endoscopy;;  hepatic flexure, cecal, proximal transverse colon   SHOULDER ARTHROSCOPY WITH ROTATOR CUFF REPAIR Right 08/04/2021   Procedure: SHOULDER ARTHROSCOPY WITH ROTATOR CUFF REPAIR,EXTENSIVE DEBRIDEMENT,  SUBACROMIAL DECOMPRESSION,DISTAL CLAVICLE RESCTION;  Surgeon: Nicholes Stairs, MD;  [facility];  Service: Orthopedics;  Laterality: Right;  174mn   VIDEO ASSISTED THORACOSCOPY (VATS)/DECORTICATION Right 07/22/2019   Procedure: VIDEO ASSISTED THORACOSCOPY (VATS)/DECORTICATION;  Surgeon: LLajuana Matte MD;  [facility]  Service: Thoracic;  Laterality: Right;    There were no vitals  filed for this visit.   Subjective Assessment - 09/19/21 1007     Subjective  S: I don't have to wear the sling anymore.    Currently in Pain? No/denies                [facility] - 09/19/21 1008       Assessment   Medical Diagnosis s/p right RCR, extensive debridement, SAD, DCR    Next MD Visit 10/12/21      Precautions   Precautions Shoulder    Type of Shoulder Precautions RTC protocol. See media tab                      OT Treatments/Exercises (OP) - 09/19/21 1008       Exercises   Exercises Shoulder      Shoulder Exercises: Supine   Protraction PROM;5 reps;AAROM;10 reps    Horizontal ABduction PROM;5 reps;AAROM;10 reps    External Rotation PROM;5 reps;AAROM;10 reps    Internal Rotation PROM;5 reps;AAROM;10 reps    Flexion PROM;5 reps;AAROM;10 reps    ABduction PROM;5 reps      Shoulder Exercises: Standing   Protraction AAROM;10 reps    Horizontal ABduction AAROM;10 reps    External Rotation AAROM;10 reps    Internal Rotation AAROM;10 reps    Flexion AAROM;10 reps    ABduction AAROM;10 reps                    OT Education - 09/19/21 1022     Education Details AA/ROM Standing. May stop previous exercises.    Person(s) Educated Patient    Methods Explanation;Demonstration;Verbal cues;Handout    Comprehension Verbalized understanding;Returned demonstration              OT Short Term Goals - 08/16/21 1437       OT SHORT TERM GOAL #1   Title Pt will be provided with and educated on HEP to improve mobility required to complete ADLs.    Time 4    Period Weeks    Status On-going    Target Date 09/10/21      OT SHORT TERM GOAL #2   Title Pt will increase P/ROM in [facility] improve ability to perform dressing tasks with less compensatory strategies.    Time 4    Period Weeks    Status On-going      OT SHORT TERM GOAL #3   Title Pt will increase RUE strength to 3/5 to improve ability to reach items at waist to  chest height.    Time 4    Period Weeks    Status On-going               OT Long Term Goals - 08/16/21 1437       OT LONG TERM GOAL #1   Title Pt will decrease pain in RUE to 3/10 or less to improve ability to sleep for 3+ hours without waking due to pain.    Time 8    Period Weeks    Status On-going      OT LONG TERM GOAL #2  Title Pt will decrease RUE fascial restrictions to minimal amounts or less to improve ability to perform overhead reaching tasks.    Time 8    Period Weeks    Status On-going      OT LONG TERM GOAL #3   Title Pt will increase RUE A/ROM to [facility] to improve ability to perform dressing and bathing tasks.    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    Time 8    Period Weeks    Status On-going                   Plan - 09/19/21 1244     Clinical Impression Statement A: Myofascial release was not completed this date due to no report of pain upon arrival. Pt continues to be limited by pain during passive ROM and is able to achieve approximately 90-95 degrees of flexion and abduction. Completed AA/ROM supine and standing with VC for form and technique provided.    Body Structure / Function / Physical Skills ADL;Endurance;Muscle spasms;UE functional use;Fascial restriction;Pain;ROM;IADL;Strength    Plan P: Continue with AA/ROM. Add PVC pipe slide and pulleys. NOTE: Izora Gala has reached out to workers comp to [employer] for more OT visits. They said it was faxed although it was never received.    OT Home Exercise Plan eval: table slides 10/4: AA/ROM    Consulted and Agree with Plan of Care Patient             Patient will benefit from skilled therapeutic intervention in order to improve the following deficits and impairments:   Body Structure / Function / Physical Skills: ADL, Endurance, Muscle spasms, UE  functional use, Fascial restriction, Pain, ROM, IADL, Strength       Visit Diagnosis: Stiffness of right shoulder, not elsewhere classified  Acute pain of right shoulder  Other symptoms and signs involving the musculoskeletal system    Problem List Patient Active Problem List   Diagnosis Date Noted   Empyema lung ([place]) 07/20/2019   Acute respiratory failure with hypoxia ([place]) 07/20/2019   Empyema ([place]) 07/20/2019   Acute respiratory distress 07/19/2019   Pleural effusion 07/19/2019   Gastric ulcer 09/22/2018   Other cirrhosis of liver ([place]) 08/07/2018   Diabetes ([place])    Cirrhosis of liver with ascites [facility])     Ailene Ravel, OTR/L,CBIS  [phone]  09/19/2021, 1:25 PM  [facility] [place], [place], [address] Phone: [phone]   Fax:  [phone]  Name: Lawrence Rogers MRN: [id number] Date of Birth: 06/09/1961

## 2021-09-19 NOTE — Patient Instructions (Signed)

## 2021-09-19 NOTE — Telephone Encounter (Signed)
S/w Noemi Chapel called back she has requested the auth twice- their auth rep is out/she contacted the covering person and should have the auth by Friday's apptment 09/22/21.

## 2021-09-19 NOTE — Telephone Encounter (Signed)
L/m for Kelby Aline Adjustor Also. [employer] Contact: Kelby Aline Adjustor:[phone] Auth Worker's Comp Approved 12 OT visits. Claim #[id number] S/w Noemi Chapel on 9/27 she was to fax them on 9/30. I do not see them in his chart. Just l/m for Noemi Chapel to call me and fax a new OT auth ASAP

## 2021-09-19 NOTE — Telephone Encounter (Signed)
Auth Worker's Comp Approved 12 OT visits. Claim #[id number] S/w Noemi Chapel on 9/27 she was to fax them on 9/30. I do not see them in his chart. Just l/m for Noemi Chapel to call me and fax a new OT auth ASAP. L/m for Kelby Aline Adjustor also.

## 2021-09-22 ENCOUNTER — Ambulatory Visit (HOSPITAL_COMMUNITY)
Admission: RE | Admit: 2021-09-22 | Discharge: 2021-09-22 | Disposition: A | Discharge status: Home / Self Care | Payer: No Typology Code available for payment source | Source: Ambulatory Visit | Attending: Nurse Practitioner | Admitting: Nurse Practitioner

## 2021-09-22 ENCOUNTER — Other Ambulatory Visit: Payer: Self-pay

## 2021-09-22 ENCOUNTER — Ambulatory Visit (HOSPITAL_COMMUNITY): Payer: No Typology Code available for payment source | Attending: Orthopedic Surgery | Admitting: Occupational Therapy

## 2021-09-22 ENCOUNTER — Other Ambulatory Visit (HOSPITAL_COMMUNITY): Payer: Self-pay | Admitting: Nurse Practitioner

## 2021-09-22 DIAGNOSIS — M25611 Stiffness of right shoulder, not elsewhere classified: Secondary | ICD-10-CM | POA: Diagnosis not present

## 2021-09-22 DIAGNOSIS — M25511 Pain in right shoulder: Secondary | ICD-10-CM | POA: Insufficient documentation

## 2021-09-22 DIAGNOSIS — R29898 Other symptoms and signs involving the musculoskeletal system: Secondary | ICD-10-CM | POA: Insufficient documentation

## 2021-09-22 DIAGNOSIS — R059 Cough, unspecified: Secondary | ICD-10-CM

## 2021-09-22 NOTE — Therapy (Signed)
[facility] [address] [place], [place], [address] Phone: [phone]   Fax:  [phone]  Occupational Therapy Treatment  Patient Details  Name: Lawrence Rogers MRN: [id number] Date of Birth: 22-Jul-1961 Referring Provider (OT): Dr. Victorino December   Encounter Date: 09/22/2021   OT End of Session - 09/22/21 1021     Visit Number 12    Number of Visits 16    Date for OT Re-Evaluation 10/10/21    Authorization Type Worker's Comp    Authorization Time Period 12 visits approved    Authorization - Visit Number 12    Authorization - Number of Visits 12    OT Start Time [phone]    OT Stop Time 1027    OT Time Calculation (min) 41 min    Activity Tolerance Patient tolerated treatment well    Behavior During Therapy [facility] for tasks assessed/performed             Past Medical History:  Diagnosis Date   Arthritis    Cirrhosis ([place])    Depression    Diabetes ([place])     Past Surgical History:  Procedure Laterality Date   CATARACT EXTRACTION W/PHACO Left 10/26/2019   Procedure: CATARACT EXTRACTION PHACO AND INTRAOCULAR LENS PLACEMENT LEFT EYE CDE=5.20;  Surgeon: Baruch Goldmann, MD;  [facility];  Service: Ophthalmology;  Laterality: Left;  left   CATARACT EXTRACTION W/PHACO Right 11/09/2019   Procedure: CATARACT EXTRACTION PHACO AND INTRAOCULAR LENS PLACEMENT (IOC);  Surgeon: Baruch Goldmann, MD;  [facility];  Service: Ophthalmology;  Laterality: Right;  CDE: 3.03   COLONOSCOPY WITH PROPOFOL N/A 12/04/2019   Procedure: COLONOSCOPY WITH PROPOFOL;  Surgeon: Rogene Houston, MD;  [facility];  Service: Endoscopy;  Laterality: N/A;   ESOPHAGEAL BANDING N/A 08/29/2018   Procedure: ESOPHAGEAL BANDING;  Surgeon: Rogene Houston, MD;  [facility];  Service: Endoscopy;  Laterality: N/A;   ESOPHAGEAL BANDING  10/24/2018   Procedure: ESOPHAGEAL BANDING;  Surgeon: Rogene Houston, MD;  [facility];   Service: Endoscopy;;  3 bands   ESOPHAGEAL BANDING  12/04/2019   Procedure: ESOPHAGEAL BANDING;  Surgeon: Rogene Houston, MD;  [facility];  Service: Endoscopy;;   ESOPHAGOGASTRODUODENOSCOPY (EGD) WITH PROPOFOL N/A 08/29/2018   Procedure: ESOPHAGOGASTRODUODENOSCOPY (EGD) WITH PROPOFOL;  Surgeon: Rogene Houston, MD;  [facility];  Service: Endoscopy;  Laterality: N/A;  9:30   ESOPHAGOGASTRODUODENOSCOPY (EGD) WITH PROPOFOL N/A 10/24/2018   Procedure: ESOPHAGOGASTRODUODENOSCOPY (EGD) WITH PROPOFOL;  Surgeon: Rogene Houston, MD;  [facility];  Service: Endoscopy;  Laterality: N/A;  7:30   ESOPHAGOGASTRODUODENOSCOPY (EGD) WITH PROPOFOL N/A 12/04/2019   Procedure: ESOPHAGOGASTRODUODENOSCOPY (EGD) WITH PROPOFOL;  Surgeon: Rogene Houston, MD;  [facility];  Service: Endoscopy;  Laterality: N/A;   IR PARACENTESIS  04/18/2018   IR TRANSCATHETER BX  04/18/2018   IR US GUIDE VASC ACCESS RIGHT  04/18/2018   IR VENOGRAM HEPATIC W HEMODYNAMIC EVALUATION  04/18/2018   PLEURAL EFFUSION DRAINAGE Right 07/22/2019   Procedure: Drainage Of Pleural Effusion;  Surgeon: Lajuana Matte, MD;  Location: [place];  Service: Thoracic;  Laterality: Right;   POLYPECTOMY  12/04/2019   Procedure: POLYPECTOMY;  Surgeon: Rogene Houston, MD;  [facility];  Service: Endoscopy;;  hepatic flexure, cecal, proximal transverse colon   SHOULDER ARTHROSCOPY WITH ROTATOR CUFF REPAIR Right 08/04/2021   Procedure: SHOULDER ARTHROSCOPY WITH ROTATOR CUFF REPAIR,EXTENSIVE DEBRIDEMENT,  SUBACROMIAL DECOMPRESSION,DISTAL CLAVICLE RESCTION;  Surgeon: Nicholes Stairs, MD;  [facility];  Service: Orthopedics;  Laterality: Right;  138mn   VIDEO ASSISTED THORACOSCOPY (VATS)/DECORTICATION Right 07/22/2019   Procedure: VIDEO ASSISTED THORACOSCOPY (VATS)/DECORTICATION;  Surgeon: LLajuana Matte MD;  [facility]  Service: Thoracic;  Laterality: Right;    There were no vitals  filed for this visit.   Subjective Assessment - 09/22/21 0945     Subjective  S: I was sore on Tuesday after I was here.    Currently in Pain? No/denies   only with movement   Pain Location --    Pain Orientation --    Pain Descriptors / Indicators --    Pain Type --                [facility] Assessment - 09/22/21 0945       Assessment   Medical Diagnosis s/p right RCR, extensive debridement, SAD, DCR      Precautions   Precautions Shoulder    Type of Shoulder Precautions RTC protocol. See media tab                      OT Treatments/Exercises (OP) - 09/22/21 0950       Exercises   Exercises Shoulder      Shoulder Exercises: Supine   Protraction PROM;5 reps;AAROM;10 reps    Horizontal ABduction PROM;5 reps;AAROM;10 reps    External Rotation PROM;5 reps;AAROM;10 reps    Internal Rotation PROM;5 reps;AAROM;10 reps    Flexion PROM;5 reps;AAROM;10 reps    ABduction PROM;5 reps      Shoulder Exercises: Standing   Protraction AAROM;10 reps    Horizontal ABduction AAROM;10 reps    External Rotation AAROM;10 reps    Internal Rotation AAROM;10 reps    Flexion AAROM;10 reps    ABduction AAROM;10 reps      Shoulder Exercises: Pulleys   Flexion 1 minute    ABduction 1 minute                      OT Short Term Goals - 08/16/21 1437       OT SHORT TERM GOAL #1   Title Pt will be provided with and educated on HEP to improve mobility required to complete ADLs.    Time 4    Period Weeks    Status On-going    Target Date 09/10/21      OT SHORT TERM GOAL #2   Title Pt will [facility] ability to perform dressing tasks with less compensatory strategies.    Time 4    Period Weeks    Status On-going      OT SHORT TERM GOAL #3   Title Pt will increase RUE strength to 3/5 to improve ability to reach items at waist to chest height.    Time 4    Period Weeks    Status On-going               OT Long Term  Goals - 08/16/21 1437       OT LONG TERM GOAL #1   Title Pt will decrease pain in RUE to 3/10 or less to improve ability to sleep for 3+ hours without waking due to pain.    Time 8    Period Weeks    Status On-going      OT LONG TERM GOAL #2   Title Pt  will decrease RUE fascial restrictions to minimal amounts or less to improve ability to perform overhead reaching tasks.    Time 8    Period Weeks    Status On-going      OT LONG TERM GOAL #3   Title Pt will increase RUE A/ROM to [facility] to improve ability to perform dressing and bathing tasks.    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    Time 8    Period Weeks    Status On-going                   Plan - 09/22/21 1012     Clinical Impression Statement A: No manual techniques required today as pt with min restrictions and no pain. Continued with passive stretching, limited to approximately 90 degrees due to pain. AA/ROM completed in supine and standing with slight improvement in ROM tolerance. Completed pulleys. Verbal cuing for form and technique.    Body Structure / Function / Physical Skills ADL;Endurance;Muscle spasms;UE functional use;Fascial restriction;Pain;ROM;IADL;Strength    Plan P: Continue with AA/ROM.  NOTE: Noemi Chapel called back she has requested the auth twice- their Josem Kaufmann rep is out/she contacted the covering person and should have the auth by Friday's apptment 09/22/21.    OT Home Exercise Plan eval: table slides 10/4: AA/ROM    Consulted and Agree with Plan of Care Patient             Patient will benefit from skilled therapeutic intervention in order to improve the following deficits and impairments:   Body Structure / Function / Physical Skills: ADL, Endurance, Muscle spasms, UE functional use, Fascial restriction, Pain, ROM, IADL, Strength       Visit  Diagnosis: Stiffness of right shoulder, not elsewhere classified  Acute pain of right shoulder  Other symptoms and signs involving the musculoskeletal system    Problem List Patient Active Problem List   Diagnosis Date Noted   Empyema lung ([place]) 07/20/2019   Acute respiratory failure with hypoxia ([place]) 07/20/2019   Empyema ([place]) 07/20/2019   Acute respiratory distress 07/19/2019   Pleural effusion 07/19/2019   Gastric ulcer 09/22/2018   Other cirrhosis of liver ([place]) 08/07/2018   Diabetes ([place])    Cirrhosis of liver with ascites [facility])     Guadelupe Sabin, OTR/L  [phone] 09/22/2021, 10:29 AM  [place], [place], [address] Phone: [phone]   Fax:  [phone]  Name: REINO LYBBERT MRN: [id number] Date of Birth: 1961/01/21

## 2021-09-26 ENCOUNTER — Encounter (HOSPITAL_COMMUNITY): Payer: Managed Care, Other (non HMO)

## 2021-09-29 ENCOUNTER — Ambulatory Visit (HOSPITAL_COMMUNITY): Payer: No Typology Code available for payment source | Attending: Orthopedic Surgery | Admitting: Occupational Therapy

## 2021-09-29 ENCOUNTER — Encounter (HOSPITAL_COMMUNITY): Payer: Self-pay | Admitting: Occupational Therapy

## 2021-09-29 ENCOUNTER — Other Ambulatory Visit: Payer: Self-pay

## 2021-09-29 DIAGNOSIS — M25511 Pain in right shoulder: Secondary | ICD-10-CM | POA: Insufficient documentation

## 2021-09-29 DIAGNOSIS — M25611 Stiffness of right shoulder, not elsewhere classified: Secondary | ICD-10-CM | POA: Insufficient documentation

## 2021-09-29 DIAGNOSIS — R29898 Other symptoms and signs involving the musculoskeletal system: Secondary | ICD-10-CM | POA: Insufficient documentation

## 2021-09-29 NOTE — Therapy (Signed)
[place], [place], [address] Phone: [phone]   Fax:  [phone]  Occupational Therapy Treatment  Patient Details  Name: Lawrence Rogers MRN: [id number] Date of Birth: Dec 27, 1960 Referring Provider (OT): Dr. Victorino December   Encounter Date: 09/29/2021   OT End of Session - 09/29/21 1018     Visit Number 13    Number of Visits 16    Date for OT Re-Evaluation 10/10/21    Authorization Type Worker's Comp    Authorization Time Period 12 visits approved; waiting on additional visit approval    Authorization - Visit Number 12    Authorization - Number of Visits 12    OT Start Time [phone]    OT Stop Time 1018    OT Time Calculation (min) 30 min    Activity Tolerance Patient tolerated treatment well    Behavior During Therapy [facility] for tasks assessed/performed             Past Medical History:  Diagnosis Date   Arthritis    Cirrhosis ([place])    Depression    Diabetes ([place])     Past Surgical History:  Procedure Laterality Date   CATARACT EXTRACTION W/PHACO Left 10/26/2019   Procedure: CATARACT EXTRACTION PHACO AND INTRAOCULAR LENS PLACEMENT LEFT EYE CDE=5.20;  Surgeon: Baruch Goldmann, MD;  [facility];  Service: Ophthalmology;  Laterality: Left;  left   CATARACT EXTRACTION W/PHACO Right 11/09/2019   Procedure: CATARACT EXTRACTION PHACO AND INTRAOCULAR LENS PLACEMENT (IOC);  Surgeon: Baruch Goldmann, MD;  [facility];  Service: Ophthalmology;  Laterality: Right;  CDE: 3.03   COLONOSCOPY WITH PROPOFOL N/A 12/04/2019   Procedure: COLONOSCOPY WITH PROPOFOL;  Surgeon: Rogene Houston, MD;  [facility];  Service: Endoscopy;  Laterality: N/A;   ESOPHAGEAL BANDING N/A 08/29/2018   Procedure: ESOPHAGEAL BANDING;  Surgeon: Rogene Houston, MD;  [facility];  Service: Endoscopy;  Laterality: N/A;   ESOPHAGEAL BANDING  10/24/2018   Procedure: ESOPHAGEAL BANDING;  Surgeon: Rogene Houston,  MD;  [facility];  Service: Endoscopy;;  3 bands   ESOPHAGEAL BANDING  12/04/2019   Procedure: ESOPHAGEAL BANDING;  Surgeon: Rogene Houston, MD;  [facility];  Service: Endoscopy;;   ESOPHAGOGASTRODUODENOSCOPY (EGD) WITH PROPOFOL N/A 08/29/2018   Procedure: ESOPHAGOGASTRODUODENOSCOPY (EGD) WITH PROPOFOL;  Surgeon: Rogene Houston, MD;  [facility];  Service: Endoscopy;  Laterality: N/A;  9:30   ESOPHAGOGASTRODUODENOSCOPY (EGD) WITH PROPOFOL N/A 10/24/2018   Procedure: ESOPHAGOGASTRODUODENOSCOPY (EGD) WITH PROPOFOL;  Surgeon: Rogene Houston, MD;  [facility];  Service: Endoscopy;  Laterality: N/A;  7:30   ESOPHAGOGASTRODUODENOSCOPY (EGD) WITH PROPOFOL N/A 12/04/2019   Procedure: ESOPHAGOGASTRODUODENOSCOPY (EGD) WITH PROPOFOL;  Surgeon: Rogene Houston, MD;  [facility];  Service: Endoscopy;  Laterality: N/A;   IR PARACENTESIS  04/18/2018   IR TRANSCATHETER BX  04/18/2018   IR US GUIDE VASC ACCESS RIGHT  04/18/2018   IR VENOGRAM HEPATIC W HEMODYNAMIC EVALUATION  04/18/2018   PLEURAL EFFUSION DRAINAGE Right 07/22/2019   Procedure: Drainage Of Pleural Effusion;  Surgeon: Lajuana Matte, MD;  Location: [place];  Service: Thoracic;  Laterality: Right;   POLYPECTOMY  12/04/2019   Procedure: POLYPECTOMY;  Surgeon: Rogene Houston, MD;  [facility];  Service: Endoscopy;;  hepatic flexure, cecal, proximal transverse colon   SHOULDER ARTHROSCOPY WITH ROTATOR CUFF REPAIR Right 08/04/2021   Procedure: SHOULDER ARTHROSCOPY  WITH ROTATOR CUFF REPAIR,EXTENSIVE DEBRIDEMENT, SUBACROMIAL DECOMPRESSION,DISTAL CLAVICLE RESCTION;  Surgeon: Nicholes Stairs, MD;  [facility];  Service: Orthopedics;  Laterality: Right;  19mn   VIDEO ASSISTED THORACOSCOPY (VATS)/DECORTICATION Right 07/22/2019   Procedure: VIDEO ASSISTED THORACOSCOPY (VATS)/DECORTICATION;  Surgeon: LLajuana Matte MD;  [place]  Service: Thoracic;  Laterality:  Right;    There were no vitals filed for this visit.   Subjective Assessment - 09/29/21 0947     Subjective  S: Nothing has changed really.    Currently in Pain? Yes    Pain Score 4     Pain Location Shoulder    Pain Orientation Right    Pain Descriptors / Indicators Aching;Sore    Pain Type Acute pain    Pain Radiating Towards None    Pain Onset More than a month ago    Pain Frequency Constant    Aggravating Factors  unsure    Pain Relieving Factors pain medication and heat    Effect of Pain on Daily Activities mod to max effect on ADLs    Multiple Pain Sites No                [facility] OT Assessment - 09/29/21 0946       Assessment   Medical Diagnosis s/p right RCR, extensive debridement, SAD, DCR      Precautions   Precautions Shoulder    Type of Shoulder Precautions RTC protocol. See media tab                      OT Treatments/Exercises (OP) - 09/29/21 0951       Exercises   Exercises Shoulder      Shoulder Exercises: Supine   Protraction PROM;5 reps;AAROM;12 reps    Horizontal ABduction PROM;5 reps;AAROM;12 reps    External Rotation PROM;5 reps;AAROM;12 reps    Internal Rotation PROM;5 reps;AAROM;12 reps    Flexion PROM;5 reps;AAROM;12 reps    ABduction PROM;5 reps      Shoulder Exercises: Seated   Protraction AAROM;10 reps    Horizontal ABduction AAROM;10 reps    External Rotation AAROM;10 reps    Internal Rotation AAROM;10 reps    Flexion AAROM;10 reps    Abduction AAROM;10 reps                      OT Short Term Goals - 08/16/21 1437       OT SHORT TERM GOAL #1   Title Pt will be provided with and educated on HEP to improve mobility required to complete ADLs.    Time 4    Period Weeks    Status On-going    Target Date 09/10/21      OT SHORT TERM GOAL #2   Title Pt will [facility] to perform dressing tasks with less compensatory strategies.    Time 4    Period Weeks     Status On-going      OT SHORT TERM GOAL #3   Title Pt will increase RUE strength to 3/5 to improve ability to reach items at waist to chest height.    Time 4    Period Weeks    Status On-going               OT Long Term Goals - 08/16/21 1437       OT LONG TERM GOAL #1   Title Pt will decrease pain in  RUE to 3/10 or less to improve ability to sleep for 3+ hours without waking due to pain.    Time 8    Period Weeks    Status On-going      OT LONG TERM GOAL #2   Title Pt will decrease RUE fascial restrictions to minimal amounts or less to improve ability to perform overhead reaching tasks.    Time 8    Period Weeks    Status On-going      OT LONG TERM GOAL #3   Title Pt will increase RUE A/ROM to [facility] to improve ability to perform dressing and bathing tasks.    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    Time 8    Period Weeks    Status On-going                   Plan - 09/29/21 1013     Clinical Impression Statement A: Pt reports no HEP since last week due to being sick, during session OT notes pt continues to be sick therefore session ended early. No manual therapy completed, passive stretching completed however pt with limited tolerance. Pt completing AA/ROM in supine and sitting. Verbal cuing for form and technique.    Body Structure / Function / Physical Skills ADL;Endurance;Muscle spasms;UE functional use;Fascial restriction;Pain;ROM;IADL;Strength    Plan P: NOTE: CHECK FOR VISIT APPROVAL. [employer] [facility] has requested the auth twice- their auth rep is out/she contacted the covering person    OT Home Exercise Plan eval: table slides 10/4: AA/ROM    Consulted and Agree with Plan of Care Patient             Patient will benefit from skilled therapeutic intervention in order to improve the following deficits and  impairments:   Body Structure / Function / Physical Skills: ADL, Endurance, Muscle spasms, UE functional use, Fascial restriction, Pain, ROM, IADL, Strength       Visit Diagnosis: Stiffness of right shoulder, not elsewhere classified  Acute pain of right shoulder  Other symptoms and signs involving the musculoskeletal system    Problem List Patient Active Problem List   Diagnosis Date Noted   Empyema lung ([place]) 07/20/2019   Acute respiratory failure with hypoxia ([place]) 07/20/2019   Empyema ([place]) 07/20/2019   Acute respiratory distress 07/19/2019   Pleural effusion 07/19/2019   Gastric ulcer 09/22/2018   Other cirrhosis of liver ([place]) 08/07/2018   Diabetes ([place])    Cirrhosis of liver with ascites [facility])    Guadelupe Sabin, OTR/L  [phone] 09/29/2021, 10:20 AM  [place], [place], [address] Phone: [phone]   Fax:  [phone]  Name: Lawrence Rogers MRN: [id number] Date of Birth: Feb 05, 1961

## 2021-10-03 ENCOUNTER — Ambulatory Visit (HOSPITAL_COMMUNITY): Payer: No Typology Code available for payment source | Attending: Orthopedic Surgery

## 2021-10-03 ENCOUNTER — Other Ambulatory Visit: Payer: Self-pay

## 2021-10-03 ENCOUNTER — Encounter (HOSPITAL_COMMUNITY): Payer: Self-pay

## 2021-10-03 DIAGNOSIS — M25511 Pain in right shoulder: Secondary | ICD-10-CM | POA: Insufficient documentation

## 2021-10-03 DIAGNOSIS — M25611 Stiffness of right shoulder, not elsewhere classified: Secondary | ICD-10-CM | POA: Insufficient documentation

## 2021-10-03 DIAGNOSIS — R29898 Other symptoms and signs involving the musculoskeletal system: Secondary | ICD-10-CM | POA: Diagnosis present

## 2021-10-03 NOTE — Patient Instructions (Signed)
Repeat all exercises 10-15 times, 1-2 times per day.  1) Shoulder Protraction    Begin with elbows by your side, slowly "punch" straight out in front of you.      2) Shoulder Flexion  Supine:     Standing:         Begin with arms at your side with thumbs pointed up, slowly raise both arms up and forward towards overhead.       3) Horizontal abduction/adduction  Supine:   Standing:           Begin with arms straight out in front of you, bring out to the side in at "T" shape. Keep arms straight entire time.      4) Internal & External Rotation   Supine:     Standing:     Stand with elbows at the side and elbows bent 90 degrees. Move your forearms away from your body, then bring back inward toward the body.     5) Shoulder Abduction  Standing/Sitting:       Begin with your arms next to your side. Slowly move your arms out to the side so that they go overhead, in a jumping jack or snow angel movement.

## 2021-10-03 NOTE — Therapy (Signed)
[facility] [address] [place], [place], [address] Phone: [phone]   Fax:  [phone]  Occupational Therapy Treatment  Patient Details  Name: Lawrence Rogers MRN: [id number] Date of Birth: 1961-12-13 Referring Provider (OT): Dr. Victorino December   Encounter Date: 10/03/2021   OT End of Session - 10/03/21 1118     Visit Number 14    Number of Visits 16    Date for OT Re-Evaluation 10/10/21    Authorization Type Worker's Comp    Authorization Time Period 12 additional visits approved starting 09/20/21    Authorization - Visit Number 1    Authorization - Number of Visits 12    OT Start Time 8032    OT Stop Time 1023    OT Time Calculation (min) 33 min    Activity Tolerance Patient limited by fatigue    Behavior During Therapy Flat affect             Past Medical History:  Diagnosis Date   Arthritis    Cirrhosis ([place])    Depression    Diabetes ([place])     Past Surgical History:  Procedure Laterality Date   CATARACT EXTRACTION W/PHACO Left 10/26/2019   Procedure: CATARACT EXTRACTION PHACO AND INTRAOCULAR LENS PLACEMENT LEFT EYE CDE=5.20;  Surgeon: Baruch Goldmann, MD;  [facility];  Service: Ophthalmology;  Laterality: Left;  left   CATARACT EXTRACTION W/PHACO Right 11/09/2019   Procedure: CATARACT EXTRACTION PHACO AND INTRAOCULAR LENS PLACEMENT (IOC);  Surgeon: Baruch Goldmann, MD;  [facility];  Service: Ophthalmology;  Laterality: Right;  CDE: 3.03   COLONOSCOPY WITH PROPOFOL N/A 12/04/2019   Procedure: COLONOSCOPY WITH PROPOFOL;  Surgeon: Rogene Houston, MD;  [facility];  Service: Endoscopy;  Laterality: N/A;   ESOPHAGEAL BANDING N/A 08/29/2018   Procedure: ESOPHAGEAL BANDING;  Surgeon: Rogene Houston, MD;  [facility];  Service: Endoscopy;  Laterality: N/A;   ESOPHAGEAL BANDING  10/24/2018   Procedure: ESOPHAGEAL BANDING;  Surgeon: Rogene Houston, MD;  [facility];   Service: Endoscopy;;  3 bands   ESOPHAGEAL BANDING  12/04/2019   Procedure: ESOPHAGEAL BANDING;  Surgeon: Rogene Houston, MD;  [facility];  Service: Endoscopy;;   ESOPHAGOGASTRODUODENOSCOPY (EGD) WITH PROPOFOL N/A 08/29/2018   Procedure: ESOPHAGOGASTRODUODENOSCOPY (EGD) WITH PROPOFOL;  Surgeon: Rogene Houston, MD;  [facility];  Service: Endoscopy;  Laterality: N/A;  9:30   ESOPHAGOGASTRODUODENOSCOPY (EGD) WITH PROPOFOL N/A 10/24/2018   Procedure: ESOPHAGOGASTRODUODENOSCOPY (EGD) WITH PROPOFOL;  Surgeon: Rogene Houston, MD;  [facility];  Service: Endoscopy;  Laterality: N/A;  7:30   ESOPHAGOGASTRODUODENOSCOPY (EGD) WITH PROPOFOL N/A 12/04/2019   Procedure: ESOPHAGOGASTRODUODENOSCOPY (EGD) WITH PROPOFOL;  Surgeon: Rogene Houston, MD;  [facility];  Service: Endoscopy;  Laterality: N/A;   IR PARACENTESIS  04/18/2018   IR TRANSCATHETER BX  04/18/2018   IR US GUIDE VASC ACCESS RIGHT  04/18/2018   IR VENOGRAM HEPATIC W HEMODYNAMIC EVALUATION  04/18/2018   PLEURAL EFFUSION DRAINAGE Right 07/22/2019   Procedure: Drainage Of Pleural Effusion;  Surgeon: Lajuana Matte, MD;  Location: [place];  Service: Thoracic;  Laterality: Right;   POLYPECTOMY  12/04/2019   Procedure: POLYPECTOMY;  Surgeon: Rogene Houston, MD;  [facility];  Service: Endoscopy;;  hepatic flexure, cecal, proximal transverse colon   SHOULDER ARTHROSCOPY WITH ROTATOR CUFF REPAIR Right 08/04/2021   Procedure: SHOULDER ARTHROSCOPY WITH ROTATOR CUFF REPAIR,EXTENSIVE  DEBRIDEMENT, SUBACROMIAL DECOMPRESSION,DISTAL CLAVICLE RESCTION;  Surgeon: Nicholes Stairs, MD;  [facility];  Service: Orthopedics;  Laterality: Right;  143mn   VIDEO ASSISTED THORACOSCOPY (VATS)/DECORTICATION Right 07/22/2019   Procedure: VIDEO ASSISTED THORACOSCOPY (VATS)/DECORTICATION;  Surgeon: LLajuana Matte MD;  [place]  Service: Thoracic;  Laterality: Right;    There were no vitals  filed for this visit.   Subjective Assessment - 10/03/21 0958     Subjective  S: I have been feeling weak lately since last week.    Currently in Pain? No/denies                [facility] - 10/03/21 0001       Assessment   Medical Diagnosis s/p right RCR, extensive debridement, SAD, DCR      Precautions   Precautions Shoulder    Type of Shoulder Precautions RTC protocol. See media tab                      OT Treatments/Exercises (OP) - 10/03/21 1000       Exercises   Exercises Shoulder      Shoulder Exercises: Supine   Protraction PROM;5 reps;AROM;10 reps    Horizontal ABduction PROM;5 reps;AAROM;10 reps    External Rotation PROM;5 reps;AROM;10 reps    Internal Rotation PROM;5 reps;AROM;10 reps    Flexion PROM;5 reps;AROM;10 reps    ABduction PROM;5 reps      Shoulder Exercises: Seated   Abduction AROM;10 reps      Shoulder Exercises: ROM/Strengthening   Wall Wash 1'      Shoulder Exercises: Stretch   Internal Rotation Stretch 2 reps   seated; 10" hold. horizontal towel                   OT Education - 10/03/21 1011     Education Details A/ROM    Person(s) Educated Patient    Methods Explanation;Demonstration;Handout;Verbal cues;Tactile cues    Comprehension Returned demonstration;Verbalized understanding              OT Short Term Goals - 08/16/21 1437       OT SHORT TERM GOAL #1   Title Pt will be provided with and educated on HEP to improve mobility required to complete ADLs.    Time 4    Period Weeks    Status On-going    Target Date 09/10/21      OT SHORT TERM GOAL #2   Title Pt will increase P/ROM in [facility] to [facility] improve ability to perform dressing tasks with less compensatory strategies.    Time 4    Period Weeks    Status On-going      OT SHORT TERM GOAL #3   Title Pt will increase RUE strength to 3/5 to improve ability to reach items at waist to chest height.    Time 4    Period Weeks     Status On-going               OT Long Term Goals - 08/16/21 1437       OT LONG TERM GOAL #1   Title Pt will decrease pain in RUE to 3/10 or less to improve ability to sleep for 3+ hours without waking due to pain.    Time 8    Period Weeks    Status On-going      OT LONG TERM GOAL #2   Title Pt will decrease RUE fascial  restrictions to minimal amounts or less to improve ability to perform overhead reaching tasks.    Time 8    Period Weeks    Status On-going      OT LONG TERM GOAL #3   Title Pt will increase RUE A/ROM to [facility] to improve ability to perform dressing and bathing tasks.    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    Time 8    Period Weeks    Status On-going                   Plan - 10/03/21 1119     Clinical Impression Statement A: Pt arrived to session with reports of feeling weak since last week. Session was completed while monitoring patient's tolerance level. Was able to progress to A/ROM supine and HEP was updated. VC for form and technique were provided.    Body Structure / Function / Physical Skills ADL;Endurance;Muscle spasms;UE functional use;Fascial restriction;Pain;ROM;IADL;Strength    Plan P: Continue to progress to standing/seated A/ROM. Remind patient to schedule more appointments after what he already has (2x a week for 4 more weeks).    OT Home Exercise Plan eval: table slides 10/4: AA/ROM 10/18: A/ROM supine abduction (seated/standing)    Consulted and Agree with Plan of Care Patient             Patient will benefit from skilled therapeutic intervention in order to improve the following deficits and impairments:   Body Structure / Function / Physical Skills: ADL, Endurance, Muscle spasms, UE functional use, Fascial restriction, Pain, ROM, IADL, Strength       Visit Diagnosis: Acute pain of  right shoulder  Other symptoms and signs involving the musculoskeletal system  Stiffness of right shoulder, not elsewhere classified    Problem List Patient Active Problem List   Diagnosis Date Noted   Empyema lung ([place]) 07/20/2019   Acute respiratory failure with hypoxia ([place]) 07/20/2019   Empyema ([place]) 07/20/2019   Acute respiratory distress 07/19/2019   Pleural effusion 07/19/2019   Gastric ulcer 09/22/2018   Other cirrhosis of liver ([place]) 08/07/2018   Diabetes ([place])    Cirrhosis of liver with ascites [facility])    Ailene Ravel, OTR/L,CBIS  [phone]   10/03/2021, 12:09 PM  [facility] [address] [place], [place], [address] Phone: [phone]   Fax:  [phone]  Name: Lawrence Rogers MRN: [id number] Date of Birth: [birth year]

## 2021-10-06 ENCOUNTER — Other Ambulatory Visit: Payer: Self-pay

## 2021-10-06 ENCOUNTER — Ambulatory Visit (HOSPITAL_COMMUNITY): Payer: No Typology Code available for payment source | Attending: Orthopedic Surgery | Admitting: Occupational Therapy

## 2021-10-06 ENCOUNTER — Encounter (HOSPITAL_COMMUNITY): Payer: Self-pay | Admitting: Occupational Therapy

## 2021-10-06 DIAGNOSIS — M25511 Pain in right shoulder: Secondary | ICD-10-CM | POA: Insufficient documentation

## 2021-10-06 DIAGNOSIS — M25611 Stiffness of right shoulder, not elsewhere classified: Secondary | ICD-10-CM | POA: Insufficient documentation

## 2021-10-06 DIAGNOSIS — R29898 Other symptoms and signs involving the musculoskeletal system: Secondary | ICD-10-CM | POA: Insufficient documentation

## 2021-10-06 NOTE — Therapy (Signed)
[facility] [address] [place], [place], [address] Phone: [phone]   Fax:  [phone]  Occupational Therapy Treatment  Patient Details  Name: Lawrence Rogers MRN: [id number] Date of Birth: 12/29/1960 Referring Provider (OT): Dr. Victorino December   Encounter Date: 10/06/2021   OT End of Session - 10/06/21 1027     Visit Number 15    Number of Visits 16    Date for OT Re-Evaluation 10/10/21    Authorization Type Worker's Comp    Authorization Time Period 12 additional visits approved starting 09/20/21    Authorization - Visit Number 2    Authorization - Number of Visits 12    OT Start Time [phone]    OT Stop Time 1027    OT Time Calculation (min) 39 min    Activity Tolerance Patient limited by fatigue    Behavior During Therapy Flat affect             Past Medical History:  Diagnosis Date   Arthritis    Cirrhosis ([place])    Depression    Diabetes ([place])     Past Surgical History:  Procedure Laterality Date   CATARACT EXTRACTION W/PHACO Left 10/26/2019   Procedure: CATARACT EXTRACTION PHACO AND INTRAOCULAR LENS PLACEMENT LEFT EYE CDE=5.20;  Surgeon: Baruch Goldmann, MD;  [facility];  Service: Ophthalmology;  Laterality: Left;  left   CATARACT EXTRACTION W/PHACO Right 11/09/2019   Procedure: CATARACT EXTRACTION PHACO AND INTRAOCULAR LENS PLACEMENT (IOC);  Surgeon: Baruch Goldmann, MD;  [facility];  Service: Ophthalmology;  Laterality: Right;  CDE: 3.03   COLONOSCOPY WITH PROPOFOL N/A 12/04/2019   Procedure: COLONOSCOPY WITH PROPOFOL;  Surgeon: Rogene Houston, MD;  [facility];  Service: Endoscopy;  Laterality: N/A;   ESOPHAGEAL BANDING N/A 08/29/2018   Procedure: ESOPHAGEAL BANDING;  Surgeon: Rogene Houston, MD;  [facility];  Service: Endoscopy;  Laterality: N/A;   ESOPHAGEAL BANDING  10/24/2018   Procedure: ESOPHAGEAL BANDING;  Surgeon: Rogene Houston, MD;  [facility];   Service: Endoscopy;;  3 bands   ESOPHAGEAL BANDING  12/04/2019   Procedure: ESOPHAGEAL BANDING;  Surgeon: Rogene Houston, MD;  [facility];  Service: Endoscopy;;   ESOPHAGOGASTRODUODENOSCOPY (EGD) WITH PROPOFOL N/A 08/29/2018   Procedure: ESOPHAGOGASTRODUODENOSCOPY (EGD) WITH PROPOFOL;  Surgeon: Rogene Houston, MD;  [facility];  Service: Endoscopy;  Laterality: N/A;  9:30   ESOPHAGOGASTRODUODENOSCOPY (EGD) WITH PROPOFOL N/A 10/24/2018   Procedure: ESOPHAGOGASTRODUODENOSCOPY (EGD) WITH PROPOFOL;  Surgeon: Rogene Houston, MD;  [facility];  Service: Endoscopy;  Laterality: N/A;  7:30   ESOPHAGOGASTRODUODENOSCOPY (EGD) WITH PROPOFOL N/A 12/04/2019   Procedure: ESOPHAGOGASTRODUODENOSCOPY (EGD) WITH PROPOFOL;  Surgeon: Rogene Houston, MD;  [facility];  Service: Endoscopy;  Laterality: N/A;   IR PARACENTESIS  04/18/2018   IR TRANSCATHETER BX  04/18/2018   IR US GUIDE VASC ACCESS RIGHT  04/18/2018   IR VENOGRAM HEPATIC W HEMODYNAMIC EVALUATION  04/18/2018   PLEURAL EFFUSION DRAINAGE Right 07/22/2019   Procedure: Drainage Of Pleural Effusion;  Surgeon: Lajuana Matte, MD;  Location: [facility];  Service: Thoracic;  Laterality: Right;   POLYPECTOMY  12/04/2019   Procedure: POLYPECTOMY;  Surgeon: Rogene Houston, MD;  [facility];  Service: Endoscopy;;  hepatic flexure, cecal, proximal transverse colon   SHOULDER ARTHROSCOPY WITH ROTATOR CUFF REPAIR Right 08/04/2021   Procedure: SHOULDER ARTHROSCOPY WITH ROTATOR CUFF REPAIR,EXTENSIVE  DEBRIDEMENT, SUBACROMIAL DECOMPRESSION,DISTAL CLAVICLE RESCTION;  Surgeon: Nicholes Stairs, MD;  [facility];  Service: Orthopedics;  Laterality: Right;  183mn   VIDEO ASSISTED THORACOSCOPY (VATS)/DECORTICATION Right 07/22/2019   Procedure: VIDEO ASSISTED THORACOSCOPY (VATS)/DECORTICATION;  Surgeon: LLajuana Matte MD;  [place]  Service: Thoracic;  Laterality: Right;    There were no vitals  filed for this visit.   Subjective Assessment - 10/06/21 0949     Subjective  S: It hurts when I put my hand behind my back.    Currently in Pain? No/denies                          OT Treatments/Exercises (OP) - 10/06/21 0950       Exercises   Exercises Shoulder      Shoulder Exercises: Supine   Protraction PROM;5 reps;AROM;10 reps    Horizontal ABduction PROM;5 reps;AAROM;10 reps    External Rotation PROM;5 reps;AROM;10 reps    Internal Rotation PROM;5 reps;AROM;10 reps    Flexion PROM;5 reps;AROM;10 reps    ABduction PROM;5 reps      Shoulder Exercises: Seated   Protraction AROM;10 reps    Horizontal ABduction AROM;10 reps    External Rotation AROM;10 reps    Internal Rotation AROM;10 reps    Flexion AROM;10 reps    Abduction AROM;10 reps      Shoulder Exercises: ROM/Strengthening   Wall Wash 1'    Proximal Shoulder Strengthening, Supine 10X each, no rest breaks    Proximal Shoulder Strengthening, Seated 10X each, no rest breaks    Other ROM/Strengthening Exercises proximal shoulder strengthening on doorway, 1' flexion                      OT Short Term Goals - 08/16/21 1437       OT SHORT TERM GOAL #1   Title Pt will be provided with and educated on HEP to improve mobility required to complete ADLs.    Time 4    Period Weeks    Status On-going    Target Date 09/10/21      OT SHORT TERM GOAL #2   Title Pt will increase P/ROM in [facility] to [facility] ability to perform dressing tasks with less compensatory strategies.    Time 4    Period Weeks    Status On-going      OT SHORT TERM GOAL #3   Title Pt will increase RUE strength to 3/5 to improve ability to reach items at waist to chest height.    Time 4    Period Weeks    Status On-going               OT Long Term Goals - 08/16/21 1437       OT LONG TERM GOAL #1   Title Pt will decrease pain in RUE to 3/10 or less to improve ability to sleep for 3+ hours without  waking due to pain.    Time 8    Period Weeks    Status On-going      OT LONG TERM GOAL #2   Title Pt will decrease RUE fascial restrictions to minimal amounts or less to improve ability to perform overhead reaching tasks.    Time 8    Period Weeks    Status On-going      OT LONG TERM GOAL #3   Title Pt will increase RUE [facility] to [facility]  improve ability to perform dressing and bathing tasks.    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    Time 8    Period Weeks    Status On-going                   Plan - 10/06/21 1014     Clinical Impression Statement A: Pt reports he has not completed his HEP this week, continues to feel subpar. Continued with A/ROM in supine and progressed to sitting. Moderate cuing required for form and technique, as pt tries to bend his elbows during ROM tasks. Also added proximal shoulder strengthening in supine and sitting. Also added proximal shoulder strengthening on doorway.    Body Structure / Function / Physical Skills ADL;Endurance;Muscle spasms;UE functional use;Fascial restriction;Pain;ROM;IADL;Strength    Plan P: Reassessment, recertification, FOTO. Already scheduled additional appts for pt.    OT Home Exercise Plan eval: table slides 10/4: AA/ROM 10/18: A/ROM    Consulted and Agree with Plan of Care Patient             Patient will benefit from skilled therapeutic intervention in order to improve the following deficits and impairments:   Body Structure / Function / Physical Skills: ADL, Endurance, Muscle spasms, UE functional use, Fascial restriction, Pain, ROM, IADL, Strength       Visit Diagnosis: Acute pain of right shoulder  Other symptoms and signs involving the musculoskeletal system  Stiffness of right shoulder, not elsewhere classified    Problem List Patient Active Problem List    Diagnosis Date Noted   Empyema lung ([place]) 07/20/2019   Acute respiratory failure with hypoxia ([place]) 07/20/2019   Empyema ([place]) 07/20/2019   Acute respiratory distress 07/19/2019   Pleural effusion 07/19/2019   Gastric ulcer 09/22/2018   Other cirrhosis of liver ([place]) 08/07/2018   Diabetes ([place])    Cirrhosis of liver with ascites [facility])     Guadelupe Sabin, OTR/L  [phone] 10/06/2021, 10:28 AM  [facility] [address] [place], [place], [address] Phone: [phone]   Fax:  [phone]  Name: Lawrence Rogers MRN: [id number] Date of Birth: 1961-08-25

## 2021-10-10 ENCOUNTER — Encounter (HOSPITAL_COMMUNITY): Payer: Self-pay

## 2021-10-10 ENCOUNTER — Ambulatory Visit (HOSPITAL_COMMUNITY): Payer: No Typology Code available for payment source | Attending: Orthopedic Surgery

## 2021-10-10 ENCOUNTER — Other Ambulatory Visit: Payer: Self-pay

## 2021-10-10 DIAGNOSIS — M25611 Stiffness of right shoulder, not elsewhere classified: Secondary | ICD-10-CM | POA: Diagnosis not present

## 2021-10-10 DIAGNOSIS — M25511 Pain in right shoulder: Secondary | ICD-10-CM | POA: Diagnosis present

## 2021-10-10 DIAGNOSIS — R29898 Other symptoms and signs involving the musculoskeletal system: Secondary | ICD-10-CM | POA: Insufficient documentation

## 2021-10-10 NOTE — Therapy (Signed)
[facility] [address] [place], [place], [address] Phone: [phone]   Fax:  [phone]  Occupational Therapy Treatment Reassessment/re-cert Patient Details  Name: Lawrence Rogers MRN: [id number] Date of Birth: 22-Mar-1961 Referring Provider (OT): Dr. Victorino December   Encounter Date: 10/10/2021   OT End of Session - 10/10/21 1016     Visit Number 16    Number of Visits 24    Date for OT Re-Evaluation 11/07/21    Authorization Type Worker's Comp    Authorization Time Period 12 additional visits approved starting 09/20/21    Authorization - Visit Number 3    Authorization - Number of Visits 12    OT Start Time 0950   reassessment   OT Stop Time 1023    OT Time Calculation (min) 33 min    Activity Tolerance Patient limited by fatigue;Patient tolerated treatment well    Behavior During Therapy Flat affect             Past Medical History:  Diagnosis Date   Arthritis    Cirrhosis ([place])    Depression    Diabetes ([place])     Past Surgical History:  Procedure Laterality Date   CATARACT EXTRACTION W/PHACO Left 10/26/2019   Procedure: CATARACT EXTRACTION PHACO AND INTRAOCULAR LENS PLACEMENT LEFT EYE CDE=5.20;  Surgeon: Baruch Goldmann, MD;  [facility];  Service: Ophthalmology;  Laterality: Left;  left   CATARACT EXTRACTION W/PHACO Right 11/09/2019   Procedure: CATARACT EXTRACTION PHACO AND INTRAOCULAR LENS PLACEMENT (IOC);  Surgeon: Baruch Goldmann, MD;  [facility];  Service: Ophthalmology;  Laterality: Right;  CDE: 3.03   COLONOSCOPY WITH PROPOFOL N/A 12/04/2019   Procedure: COLONOSCOPY WITH PROPOFOL;  Surgeon: Rogene Houston, MD;  [facility];  Service: Endoscopy;  Laterality: N/A;   ESOPHAGEAL BANDING N/A 08/29/2018   Procedure: ESOPHAGEAL BANDING;  Surgeon: Rogene Houston, MD;  [facility];  Service: Endoscopy;  Laterality: N/A;   ESOPHAGEAL BANDING  10/24/2018   Procedure: ESOPHAGEAL  BANDING;  Surgeon: Rogene Houston, MD;  [facility];  Service: Endoscopy;;  3 bands   ESOPHAGEAL BANDING  12/04/2019   Procedure: ESOPHAGEAL BANDING;  Surgeon: Rogene Houston, MD;  [facility];  Service: Endoscopy;;   ESOPHAGOGASTRODUODENOSCOPY (EGD) WITH PROPOFOL N/A 08/29/2018   Procedure: ESOPHAGOGASTRODUODENOSCOPY (EGD) WITH PROPOFOL;  Surgeon: Rogene Houston, MD;  [facility];  Service: Endoscopy;  Laterality: N/A;  9:30   ESOPHAGOGASTRODUODENOSCOPY (EGD) WITH PROPOFOL N/A 10/24/2018   Procedure: ESOPHAGOGASTRODUODENOSCOPY (EGD) WITH PROPOFOL;  Surgeon: Rogene Houston, MD;  [facility];  Service: Endoscopy;  Laterality: N/A;  7:30   ESOPHAGOGASTRODUODENOSCOPY (EGD) WITH PROPOFOL N/A 12/04/2019   Procedure: ESOPHAGOGASTRODUODENOSCOPY (EGD) WITH PROPOFOL;  Surgeon: Rogene Houston, MD;  [facility];  Service: Endoscopy;  Laterality: N/A;   IR PARACENTESIS  04/18/2018   IR TRANSCATHETER BX  04/18/2018   IR US GUIDE VASC ACCESS RIGHT  04/18/2018   IR VENOGRAM HEPATIC W HEMODYNAMIC EVALUATION  04/18/2018   PLEURAL EFFUSION DRAINAGE Right 07/22/2019   Procedure: Drainage Of Pleural Effusion;  Surgeon: Lajuana Matte, MD;  Location: [facility];  Service: Thoracic;  Laterality: Right;   POLYPECTOMY  12/04/2019   Procedure: POLYPECTOMY;  Surgeon: Rogene Houston, MD;  [facility];  Service: Endoscopy;;  hepatic flexure, cecal, proximal transverse colon   SHOULDER ARTHROSCOPY WITH ROTATOR CUFF REPAIR Right 08/04/2021   Procedure: SHOULDER  ARTHROSCOPY WITH ROTATOR CUFF REPAIR,EXTENSIVE DEBRIDEMENT, SUBACROMIAL DECOMPRESSION,DISTAL CLAVICLE RESCTION;  Surgeon: Nicholes Stairs, MD;  [facility];  Service: Orthopedics;  Laterality: Right;  124mn   VIDEO ASSISTED THORACOSCOPY (VATS)/DECORTICATION Right 07/22/2019   Procedure: VIDEO ASSISTED THORACOSCOPY (VATS)/DECORTICATION;  Surgeon: LLajuana Matte MD;  [facility]   Service: Thoracic;  Laterality: Right;    There were no vitals filed for this visit.   Subjective Assessment - 10/10/21 1128     Subjective  S: I'm tired but I'm getting better.    Currently in Pain? No/denies                [facility] - 10/10[facility]21 0953       Assessment   Medical Diagnosis s/p right RCR, extensive debridement, SAD, DCR    Referring Provider (OT) Dr. JVictorino December     Precautions   Precautions Shoulder    Type of Shoulder Precautions RTC protocol. See media tab      Observation/Other Assessments   Focus on Therapeutic Outcomes (FOTO)  55/100   previous: 28/100     ROM / Strength   AROM / PROM / Strength PROM;AROM;Strength      Palpation   Palpation comment min fascial restrictions noted in the right upper arm and upper trapezius.      AROM   Overall AROM Comments Assessed standing. IR/er adducted. A/ROM not assessed prior to this date.    AROM Assessment Site Shoulder    Right/Left Shoulder Right    Right Shoulder Flexion 101 Degrees    Right Shoulder ABduction 101 Degrees    Right Shoulder Internal Rotation 90 Degrees    Right Shoulder External Rotation 60 Degrees      PROM   Overall PROM Comments Assessed supine, er/IR adducted    PROM Assessment Site Shoulder    Right/Left Shoulder Right    Right Shoulder Flexion 115 Degrees   previous: 96   Right Shoulder ABduction 130 Degrees   previous: 86   Right Shoulder Internal Rotation 90 Degrees   previous: same   Right Shoulder External Rotation 31 Degrees   previous: 26     Strength   Overall Strength Comments Assessed standing. IR/er adducted. Strength not assessed prior to this date.    Strength Assessment Site Shoulder    Right/Left Shoulder Right    Right Shoulder Flexion 4-/5    Right Shoulder ABduction 4-/5    Right Shoulder Internal Rotation 5/5    Right Shoulder External Rotation 4-/5                      OT Treatments/Exercises (OP) - 10/10/21 1133        Exercises   Exercises Shoulder      Shoulder Exercises: Supine   Protraction PROM;5 reps    Horizontal ABduction PROM;5 reps    External Rotation PROM;5 reps    Internal Rotation PROM;5 reps    Flexion PROM;5 reps    ABduction PROM;5 reps                      OT Short Term Goals - 10/10/21 1010       OT SHORT TERM GOAL #1   Title Pt will be provided with and educated on HEP to improve mobility required to complete ADLs.    Time 4    Period Weeks    Status Achieved    Target Date 09/10/21  OT SHORT TERM GOAL #2   Title Pt will increase P/ROM in RUE to [facility] to improve ability to perform dressing tasks with less compensatory strategies.    Time 4    Period Weeks    Status Achieved      OT SHORT TERM GOAL #3   Title Pt will increase RUE strength to 3/5 to improve ability to reach items at waist to chest height.    Time 4    Period Weeks    Status Achieved               OT Long Term Goals - 10/10/21 1009       OT LONG TERM GOAL #1   Title Pt will decrease pain in RUE to 3/10 or less to improve ability to sleep for 3+ hours without waking due to pain.    Baseline 10/25: pt able to achieve 4-5 hours of sleep with a pain score of 4/10.    Time 8    Period Weeks    Status Achieved      OT LONG TERM GOAL #2   Title Pt will decrease RUE fascial restrictions to minimal amounts or less to improve ability to perform overhead reaching tasks.    Time 8    Period Weeks    Status Achieved      OT LONG TERM GOAL #3   Title Pt will increase RUE A/ROM to [facility] to improve ability to perform dressing and bathing tasks.    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    Time 8    Period Weeks    Status On-going                   Plan - 10/10/21 1134     Clinical Impression Statement A: Reassessment/re-cert completed  this date. patient has met all short term goals and 2/4 LTGs. He demonstrates improvement with P/ROM measurements and is able to measure A/ROM and MMT for the first time. FOTO score has increased fro m28 to 55/100. Patient currenly is experiencing limitations with ROM and strength. He reports that he is able to move it more than he was 4 weeks ago although it is still difficult. Pt voices a lot of difficulty reaching behind his back. Pt will continue to benefit from skilled OT serivces to focus on ROM, strength, and pain during functional activities.    Body Structure / Function / Physical Skills ADL;Endurance;Muscle spasms;UE functional use;Fascial restriction;Pain;ROM;IADL;Strength    OT Frequency 2x / week    OT Duration 4 weeks    Plan P: Continue skilled OT services to focus on mentioned deficits. Next session. A/ROM, functional reaching task, UBE bike    Consulted and Agree with Plan of Care Patient             Patient will benefit from skilled therapeutic intervention in order to improve the following deficits and impairments:   Body Structure / Function / Physical Skills: ADL, Endurance, Muscle spasms, UE functional use, Fascial restriction, Pain, ROM, IADL, Strength       Visit Diagnosis: Stiffness of right shoulder, not elsewhere classified - Plan: Ot plan of care cert/re-cert  Other symptoms and signs involving the musculoskeletal system - Plan: Ot plan of care cert/re-cert  Acute  pain of right shoulder - Plan: Ot plan of care cert/re-cert    Problem List Patient Active Problem List   Diagnosis Date Noted   Empyema lung ([place]) 07/20/2019   Acute respiratory failure with hypoxia ([place]) 07/20/2019   Empyema ([place]) 07/20/2019   Acute respiratory distress 07/19/2019   Pleural effusion 07/19/2019   Gastric ulcer 09/22/2018   Other cirrhosis of liver ([place]) 08/07/2018   Diabetes ([place])    Cirrhosis of liver with ascites [facility])   Ailene Ravel, OTR/L,CBIS   [phone]  10/10/2021, 11:43 AM  [facility] [address] [place], [place], [address] Phone: [phone]   Fax:  [phone]  Name: Lawrence Rogers MRN: [id number] Date of Birth: 29-Sep-1961

## 2021-10-12 ENCOUNTER — Other Ambulatory Visit: Payer: Self-pay

## 2021-10-12 ENCOUNTER — Ambulatory Visit (HOSPITAL_COMMUNITY): Payer: No Typology Code available for payment source | Attending: Family Medicine

## 2021-10-12 ENCOUNTER — Encounter (HOSPITAL_COMMUNITY): Payer: Self-pay

## 2021-10-12 DIAGNOSIS — M25511 Pain in right shoulder: Secondary | ICD-10-CM | POA: Insufficient documentation

## 2021-10-12 DIAGNOSIS — M25611 Stiffness of right shoulder, not elsewhere classified: Secondary | ICD-10-CM | POA: Diagnosis present

## 2021-10-12 DIAGNOSIS — R29898 Other symptoms and signs involving the musculoskeletal system: Secondary | ICD-10-CM | POA: Insufficient documentation

## 2021-10-12 NOTE — Patient Instructions (Signed)
Complete the following exercises 2-3 times a day.   Internal Rotation Across Back  Grab the end of a towel with your affected side, palm facing backwards. Grab the towel with your unaffected side and pull your affected hand across your back until you feel a stretch in the front of your shoulder. If you feel pain, pull just to the pain, do not pull through the pain. Hold. Return your affected arm to your side. Try to keep your hand/arm close to your body during the entire movement.     Hold for 20 seconds. Complete 2 times.

## 2021-10-13 ENCOUNTER — Encounter (HOSPITAL_COMMUNITY): Payer: Managed Care, Other (non HMO) | Admitting: Occupational Therapy

## 2021-10-17 NOTE — Therapy (Signed)
[facility] [address] [place], [place], [address] Phone: [phone]   Fax:  [phone]  Occupational Therapy Treatment  Patient Details  Name: Lawrence Rogers MRN: [id number] Date of Birth: 05-09-1961 Referring Provider (OT): Dr. Victorino December   Encounter Date: 10/12/2021   OT End of Session - 10/17/21 1256     Visit Number 17    Number of Visits 24    Date for OT Re-Evaluation 11/07/21    Authorization Type Worker's Comp    Authorization Time Period 12 additional visits approved starting 09/20/21    Authorization - Visit Number 4    Authorization - Number of Visits 12    OT Start Time 3009    OT Stop Time 1426    OT Time Calculation (min) 38 min    Activity Tolerance Patient limited by fatigue;Patient tolerated treatment well    Behavior During Therapy Flat affect             Past Medical History:  Diagnosis Date   Arthritis    Cirrhosis ([place])    Depression    Diabetes ([place])     Past Surgical History:  Procedure Laterality Date   CATARACT EXTRACTION W/PHACO Left 10/26/2019   Procedure: CATARACT EXTRACTION PHACO AND INTRAOCULAR LENS PLACEMENT LEFT EYE CDE=5.20;  Surgeon: Baruch Goldmann, MD;  [facility];  Service: Ophthalmology;  Laterality: Left;  left   CATARACT EXTRACTION W/PHACO Right 11/09/2019   Procedure: CATARACT EXTRACTION PHACO AND INTRAOCULAR LENS PLACEMENT (IOC);  Surgeon: Baruch Goldmann, MD;  [facility]: [facility];  Service: Ophthalmology;  Laterality: Right;  CDE: 3.03   COLONOSCOPY WITH PROPOFOL N/A 12/04/2019   Procedure: COLONOSCOPY WITH PROPOFOL;  Surgeon: Rogene Houston, MD;  [facility];  Service: Endoscopy;  Laterality: N/A;   ESOPHAGEAL BANDING N/A 08/29/2018   Procedure: ESOPHAGEAL BANDING;  Surgeon: Rogene Houston, MD;  [facility];  Service: Endoscopy;  Laterality: N/A;   ESOPHAGEAL BANDING  10/24/2018   Procedure: ESOPHAGEAL BANDING;  Surgeon: Rogene Houston, MD;   [facility];  Service: Endoscopy;;  3 bands   ESOPHAGEAL BANDING  12/04/2019   Procedure: ESOPHAGEAL BANDING;  Surgeon: Rogene Houston, MD;  [facility];  Service: Endoscopy;;   ESOPHAGOGASTRODUODENOSCOPY (EGD) WITH PROPOFOL N/A 08/29/2018   Procedure: ESOPHAGOGASTRODUODENOSCOPY (EGD) WITH PROPOFOL;  Surgeon: Rogene Houston, MD;  [facility];  Service: Endoscopy;  Laterality: N/A;  9:30   ESOPHAGOGASTRODUODENOSCOPY (EGD) WITH PROPOFOL N/A 10/24/2018   Procedure: ESOPHAGOGASTRODUODENOSCOPY (EGD) WITH PROPOFOL;  Surgeon: Rogene Houston, MD;  [facility];  Service: Endoscopy;  Laterality: N/A;  7:30   ESOPHAGOGASTRODUODENOSCOPY (EGD) WITH PROPOFOL N/A 12/04/2019   Procedure: ESOPHAGOGASTRODUODENOSCOPY (EGD) WITH PROPOFOL;  Surgeon: Rogene Houston, MD;  [facility];  Service: Endoscopy;  Laterality: N/A;   IR PARACENTESIS  04/18/2018   IR TRANSCATHETER BX  04/18/2018   IR US GUIDE VASC ACCESS RIGHT  04/18/2018   IR VENOGRAM HEPATIC W HEMODYNAMIC EVALUATION  04/18/2018   PLEURAL EFFUSION DRAINAGE Right 07/22/2019   Procedure: Drainage Of Pleural Effusion;  Surgeon: Lajuana Matte, MD;  Location: [facility];  Service: Thoracic;  Laterality: Right;   POLYPECTOMY  12/04/2019   Procedure: POLYPECTOMY;  Surgeon: Rogene Houston, MD;  [facility];  Service: Endoscopy;;  hepatic flexure, cecal, proximal transverse colon   SHOULDER ARTHROSCOPY WITH ROTATOR CUFF REPAIR Right 08/04/2021   Procedure: SHOULDER ARTHROSCOPY WITH  ROTATOR CUFF REPAIR,EXTENSIVE DEBRIDEMENT, SUBACROMIAL DECOMPRESSION,DISTAL CLAVICLE RESCTION;  Surgeon: Nicholes Stairs, MD;  [facility];  Service: Orthopedics;  Laterality: Right;  128mn   VIDEO ASSISTED THORACOSCOPY (VATS)/DECORTICATION Right 07/22/2019   Procedure: VIDEO ASSISTED THORACOSCOPY (VATS)/DECORTICATION;  Surgeon: LLajuana Matte MD;  [facility]  Service: Thoracic;  Laterality: Right;     There were no vitals filed for this visit.     10/12/21 1355  Symptoms/Limitations  Subjective  S: I saw Dr. RStann Mainlandtoday.  Pain Assessment  Currently in Pain? No/denies      [facility] - 10/17/21 1254       Assessment   Medical Diagnosis s/p right RCR, extensive debridement, SAD, DCR      Precautions   Precautions Shoulder    Type of Shoulder Precautions RTC protocol. See media tab                10/12/21 1401  Exercises  Exercises Shoulder  Shoulder Exercises: Supine  Protraction PROM;5 reps  Horizontal ABduction PROM;5 reps  External Rotation PROM;5 reps  Internal Rotation PROM;5 reps  Flexion PROM;5 reps  ABduction PROM;5 reps  Shoulder Exercises: Seated  Protraction AROM;10 reps  Horizontal ABduction AROM;10 reps  External Rotation AROM;10 reps  Internal Rotation AROM;10 reps  Flexion AROM;10 reps  Abduction AROM;10 reps  Shoulder Exercises: ROM/Strengthening  UBE (Upper Arm Bike) Level 1 2' forward 2' reverse  Shoulder Exercises: Stretch  Internal Rotation Stretch 30 seconds (horizontal towel; 2X)                  OT Education - 10/17/21 1256     Education Details internal rotation stretch    Person(s) Educated Patient    Methods Explanation;Demonstration;Handout;Verbal cues;Tactile cues    Comprehension Returned demonstration;Verbalized understanding              OT Short Term Goals - 10/17/21 1259       OT SHORT TERM GOAL #1   Title Pt will be provided with and educated on HEP to improve mobility required to complete ADLs.    Time 4    Period Weeks    Target Date 09/10/21      OT SHORT TERM GOAL #2   Title Pt will increase P/ROM in RUE to [facility] improve ability to perform dressing tasks with less compensatory strategies.    Time 4    Period Weeks      OT SHORT TERM GOAL #3   Title Pt will increase RUE strength to 3/5 to improve ability to reach items at waist to chest height.    Time 4    Period  Weeks               OT Long Term Goals - 10/17/21 1259       OT LONG TERM GOAL #1   Title Pt will decrease pain in RUE to 3/10 or less to improve ability to sleep for 3+ hours without waking due to pain.    Baseline 10/25: pt able to achieve 4-5 hours of sleep with a pain score of 4/10.    Time 8    Period Weeks      OT LONG TERM GOAL #2   Title Pt will decrease RUE fascial restrictions to minimal amounts or less to improve ability to perform overhead reaching tasks.    Time 8    Period Weeks      OT LONG TERM GOAL #3   Title  Pt will increase RUE A/ROM to [facility] to improve ability to perform dressing and bathing tasks.    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    Time 8    Period Weeks    Status On-going                   Plan - 10/17/21 1257     Clinical Impression Statement A: Pt reports that MD appoinment went well. He is no longer needing to wear the sling. He is off work for another month before he needs to follow up with Dr. Stann Mainland again. Pt reports increased difficulty with internal rotation with his RUE. Provided stretch to HEP. Focused on A/ROM and functional reaching ability during session. VC for form and technique.    Body Structure / Function / Physical Skills ADL;Endurance;Muscle spasms;UE functional use;Fascial restriction;Pain;ROM;IADL;Strength    Plan P: Continue with functional reaching tasks. A/ROM    Consulted and Agree with Plan of Care Patient             Patient will benefit from skilled therapeutic intervention in order to improve the following deficits and impairments:   Body Structure / Function / Physical Skills: ADL, Endurance, Muscle spasms, UE functional use, Fascial restriction, Pain, ROM, IADL, Strength       Visit Diagnosis: Acute pain of right shoulder  Stiffness of right shoulder,  not elsewhere classified  Other symptoms and signs involving the musculoskeletal system    Problem List Patient Active Problem List   Diagnosis Date Noted   Empyema lung ([place]) 07/20/2019   Acute respiratory failure with hypoxia ([place]) 07/20/2019   Empyema ([place]) 07/20/2019   Acute respiratory distress 07/19/2019   Pleural effusion 07/19/2019   Gastric ulcer 09/22/2018   Other cirrhosis of liver ([place]) 08/07/2018   Diabetes ([place])    Cirrhosis of liver with ascites [facility])     Ailene Ravel, OTR/L,CBIS  [phone]  10/17/2021, 1:00 PM  [place], [place], [address] Phone: [phone]   Fax:  [phone]  Name: Lawrence Rogers MRN: [id number] Date of Birth: 12-Mar-1961

## 2021-10-18 ENCOUNTER — Encounter (HOSPITAL_COMMUNITY): Payer: Self-pay

## 2021-10-18 ENCOUNTER — Other Ambulatory Visit: Payer: Self-pay

## 2021-10-18 ENCOUNTER — Ambulatory Visit (HOSPITAL_COMMUNITY): Payer: No Typology Code available for payment source | Attending: Family Medicine

## 2021-10-18 DIAGNOSIS — R29898 Other symptoms and signs involving the musculoskeletal system: Secondary | ICD-10-CM | POA: Diagnosis not present

## 2021-10-18 DIAGNOSIS — M25611 Stiffness of right shoulder, not elsewhere classified: Secondary | ICD-10-CM | POA: Insufficient documentation

## 2021-10-18 DIAGNOSIS — M25511 Pain in right shoulder: Secondary | ICD-10-CM | POA: Insufficient documentation

## 2021-10-18 NOTE — Therapy (Signed)
[facility] [place], [place], [address] Phone: [phone]   Fax:  [phone]  Occupational Therapy Treatment  Patient Details  Name: Lawrence Rogers MRN: [id number] Date of Birth: 1961-09-20 Referring Provider (OT): Dr. Victorino December   Encounter Date: 10/18/2021   OT End of Session - 10/18/21 1101     Visit Number 18    Number of Visits 24    Date for OT Re-Evaluation 11/07/21    Authorization Type Worker's Comp    Authorization Time Period 12 additional visits approved starting 09/20/21    Authorization - Visit Number 5    Authorization - Number of Visits 12    OT Start Time 0950   pt arrived late   OT Stop Time 1025    OT Time Calculation (min) 35 min    Activity Tolerance Patient limited by fatigue;Patient tolerated treatment well    Behavior During Therapy Flat affect             Past Medical History:  Diagnosis Date   Arthritis    Cirrhosis ([place])    Depression    Diabetes ([place])     Past Surgical History:  Procedure Laterality Date   CATARACT EXTRACTION W/PHACO Left 10/26/2019   Procedure: CATARACT EXTRACTION PHACO AND INTRAOCULAR LENS PLACEMENT LEFT EYE CDE=5.20;  Surgeon: Baruch Goldmann, MD;  [facility];  Service: Ophthalmology;  Laterality: Left;  left   CATARACT EXTRACTION W/PHACO Right 11/09/2019   Procedure: CATARACT EXTRACTION PHACO AND INTRAOCULAR LENS PLACEMENT (IOC);  Surgeon: Baruch Goldmann, MD;  [facility];  Service: Ophthalmology;  Laterality: Right;  CDE: 3.03   COLONOSCOPY WITH PROPOFOL N/A 12/04/2019   Procedure: COLONOSCOPY WITH PROPOFOL;  Surgeon: Rogene Houston, MD;  [facility];  Service: Endoscopy;  Laterality: N/A;   ESOPHAGEAL BANDING N/A 08/29/2018   Procedure: ESOPHAGEAL BANDING;  Surgeon: Rogene Houston, MD;  [facility];  Service: Endoscopy;  Laterality: N/A;   ESOPHAGEAL BANDING  10/24/2018   Procedure: ESOPHAGEAL BANDING;  Surgeon:  Rogene Houston, MD;  [facility];  Service: Endoscopy;;  3 bands   ESOPHAGEAL BANDING  12/04/2019   Procedure: ESOPHAGEAL BANDING;  Surgeon: Rogene Houston, MD;  [facility];  Service: Endoscopy;;   ESOPHAGOGASTRODUODENOSCOPY (EGD) WITH PROPOFOL N/A 08/29/2018   Procedure: ESOPHAGOGASTRODUODENOSCOPY (EGD) WITH PROPOFOL;  Surgeon: Rogene Houston, MD;  [facility];  Service: Endoscopy;  Laterality: N/A;  9:30   ESOPHAGOGASTRODUODENOSCOPY (EGD) WITH PROPOFOL N/A 10/24/2018   Procedure: ESOPHAGOGASTRODUODENOSCOPY (EGD) WITH PROPOFOL;  Surgeon: Rogene Houston, MD;  [facility];  Service: Endoscopy;  Laterality: N/A;  7:30   ESOPHAGOGASTRODUODENOSCOPY (EGD) WITH PROPOFOL N/A 12/04/2019   Procedure: ESOPHAGOGASTRODUODENOSCOPY (EGD) WITH PROPOFOL;  Surgeon: Rogene Houston, MD;  [facility];  Service: Endoscopy;  Laterality: N/A;   IR PARACENTESIS  04/18/2018   IR TRANSCATHETER BX  04/18/2018   IR US GUIDE VASC ACCESS RIGHT  04/18/2018   IR VENOGRAM HEPATIC W HEMODYNAMIC EVALUATION  04/18/2018   PLEURAL EFFUSION DRAINAGE Right 07/22/2019   Procedure: Drainage Of Pleural Effusion;  Surgeon: Lajuana Matte, MD;  Location: [facility];  Service: Thoracic;  Laterality: Right;   POLYPECTOMY  12/04/2019   Procedure: POLYPECTOMY;  Surgeon: Rogene Houston, MD;  [facility];  Service: Endoscopy;;  hepatic flexure, cecal, proximal transverse colon   SHOULDER ARTHROSCOPY WITH ROTATOR CUFF REPAIR Right 08/04/2021  Procedure: SHOULDER ARTHROSCOPY WITH ROTATOR CUFF REPAIR,EXTENSIVE DEBRIDEMENT, SUBACROMIAL DECOMPRESSION,DISTAL CLAVICLE RESCTION;  Surgeon: Nicholes Stairs, MD;  [facility];  Service: Orthopedics;  Laterality: Right;  185mn   VIDEO ASSISTED THORACOSCOPY (VATS)/DECORTICATION Right 07/22/2019   Procedure: VIDEO ASSISTED THORACOSCOPY (VATS)/DECORTICATION;  Surgeon: LLajuana Matte MD;  [facility]  Service:  Thoracic;  Laterality: Right;    There were no vitals filed for this visit.   Subjective Assessment - 10/18/21 1006     Subjective  S: I have a doctor's appointment on monday.    Currently in Pain? No/denies                [facility] - 10/18/21 1007       Assessment   Medical Diagnosis s/p right RCR, extensive debridement, SAD, DCR      Precautions   Precautions Shoulder    Type of Shoulder Precautions RTC protocol. See media tab                      OT Treatments/Exercises (OP) - 10/18/21 1000       Exercises   Exercises Shoulder      Shoulder Exercises: Seated   Horizontal ABduction Theraband;10 reps;Both    Theraband Level (Shoulder Horizontal ABduction) Level 2 (Red)    External Rotation Theraband;10 reps;Both    Theraband Level (Shoulder External Rotation) Level 2 (Red)    Internal Rotation Theraband;10 reps;Both    Theraband Level (Shoulder Internal Rotation) Level 2 (Red)    Flexion Theraband;10 reps    Theraband Level (Shoulder Flexion) Level 2 (Red)    Abduction Theraband;10 reps    Theraband Level (Shoulder ABduction) Level 2 (Red)      Shoulder Exercises: Therapy Ball   Other Therapy Ball Exercises green therapy ball; 1-X; chest press, flexion, circles (right/left)      Shoulder Exercises: ROM/Strengthening   UBE (Upper Arm Bike) Level 1 1' forward 1' reverse                    OT Education - 10/18/21 1012     Education Details red theraband shoulder strengthening    Person(s) Educated Patient    Methods Explanation;Demonstration;Handout;Verbal cues;Tactile cues    Comprehension Returned demonstration;Verbalized understanding              OT Short Term Goals - 10/17/21 1259       OT SHORT TERM GOAL #1   Title Pt will be provided with and educated on HEP to improve mobility required to complete ADLs.    Time 4    Period Weeks    Target Date 09/10/21      OT SHORT TERM GOAL #2   Title Pt will  increase P/ROM in RUE to [facility] improve ability to perform dressing tasks with less compensatory strategies.    Time 4    Period Weeks      OT SHORT TERM GOAL #3   Title Pt will increase RUE strength to 3/5 to improve ability to reach items at waist to chest height.    Time 4    Period Weeks               OT Long Term Goals - 10/17/21 1259       OT LONG TERM GOAL #1   Title Pt will decrease pain in RUE to 3/10 or less to improve ability to sleep for 3+ hours without waking due to pain.  Baseline 10/25: pt able to achieve 4-5 hours of sleep with a pain score of 4/10.    Time 8    Period Weeks      OT LONG TERM GOAL #2   Title Pt will decrease RUE fascial restrictions to minimal amounts or less to improve ability to perform overhead reaching tasks.    Time 8    Period Weeks      OT LONG TERM GOAL #3   Title Pt will increase RUE A/ROM to [facility] to improve ability to perform dressing and bathing tasks.    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    Time 8    Period Weeks    Status On-going                   Plan - 10/18/21 1101     Clinical Impression Statement A: Focused on scapular strength and stability during session. Red theraband used for shoulder strengthening. HEP was provided. VC for form and technique. Pt conitnues to present with increased fatigue due to unknown cause while requiring several extended time rest breaks    Body Structure / Function / Physical Skills ADL;Endurance;Muscle spasms;UE functional use;Fascial restriction;Pain;ROM;IADL;Strength    Plan P: Continue to work on strengthening and shoulder stability. Overhead lacing.    OT Home Exercise Plan eval: table slides 10/4: AA/ROM 10/18: A/ROM 11/2: red band shoulder strengthening    Consulted and Agree with Plan of Care Patient             Patient  will benefit from skilled therapeutic intervention in order to improve the following deficits and impairments:   Body Structure / Function / Physical Skills: ADL, Endurance, Muscle spasms, UE functional use, Fascial restriction, Pain, ROM, IADL, Strength       Visit Diagnosis: Other symptoms and signs involving the musculoskeletal system  Stiffness of right shoulder, not elsewhere classified  Acute pain of right shoulder    Problem List Patient Active Problem List   Diagnosis Date Noted   Empyema lung ([place]) 07/20/2019   Acute respiratory failure with hypoxia ([place]) 07/20/2019   Empyema ([place]) 07/20/2019   Acute respiratory distress 07/19/2019   Pleural effusion 07/19/2019   Gastric ulcer 09/22/2018   Other cirrhosis of liver ([place]) 08/07/2018   Diabetes ([place])    Cirrhosis of liver with ascites [facility])     Ailene Ravel, OTR/L,CBIS  [phone]  10/18/2021, 11:08 AM  [facility], [place], [address] Phone: [phone]   Fax:  [phone]  Name: Lawrence Rogers MRN: [id number] Date of Birth: May 26, 1961

## 2021-10-18 NOTE — Patient Instructions (Signed)
1) Strengthening: Chest Pull - Resisted   Hold Theraband in front of body with hands about shoulder width a part. Pull band a part and back together slowly. Repeat __10-12__ times. Repeat __1__ session(s) per day.  http://orth.exer.us/926       3) Resisted External Rotation: in Neutral - Bilateral   Sit or stand, tubing in both hands, elbows at sides, bent to 90, forearms forward. Pinch shoulder blades together and rotate forearms out. Keep elbows at sides. Repeat __10-12__ times per set. Do ___1_ sessions per day.  http://orth.exer.us/966   Copyright  VHI. All rights reserved.   4) PNF Strengthening: Resisted   Standing, hold resistive band above head. Bring right arm down and out from side. Repeat ___10-12_ times per set.  Do __1__ sessions per day.  http://orth.exer.us/922   Copyright  VHI. All rights reserved.

## 2021-10-20 ENCOUNTER — Telehealth (HOSPITAL_COMMUNITY): Payer: Self-pay

## 2021-10-20 ENCOUNTER — Encounter (HOSPITAL_COMMUNITY): Payer: Managed Care, Other (non HMO)

## 2021-10-20 NOTE — Telephone Encounter (Signed)
S/w Thea Silversmith new Nurse Case manager for [employer] comp claim. She will be emailing approval for more visits to continue care.

## 2021-10-23 ENCOUNTER — Inpatient Hospital Stay (HOSPITAL_COMMUNITY): Payer: Managed Care, Other (non HMO)

## 2021-10-23 ENCOUNTER — Emergency Department (HOSPITAL_COMMUNITY): Payer: Managed Care, Other (non HMO)

## 2021-10-23 ENCOUNTER — Other Ambulatory Visit: Payer: Self-pay

## 2021-10-23 ENCOUNTER — Inpatient Hospital Stay (HOSPITAL_COMMUNITY)
Admission: EM | Admit: 2021-10-23 | Discharge: 2021-10-31 | DRG: 432 | Disposition: A | Discharge status: Hospice | Payer: Managed Care, Other (non HMO) | Attending: Family Medicine | Admitting: Family Medicine

## 2021-10-23 ENCOUNTER — Encounter (HOSPITAL_COMMUNITY): Payer: Self-pay | Admitting: Emergency Medicine

## 2021-10-23 ENCOUNTER — Telehealth (HOSPITAL_COMMUNITY): Payer: Self-pay

## 2021-10-23 DIAGNOSIS — Z9114 Patient's other noncompliance with medication regimen: Secondary | ICD-10-CM

## 2021-10-23 DIAGNOSIS — W19XXXA Unspecified fall, initial encounter: Secondary | ICD-10-CM | POA: Diagnosis present

## 2021-10-23 DIAGNOSIS — D72823 Leukemoid reaction: Secondary | ICD-10-CM

## 2021-10-23 DIAGNOSIS — E43 Unspecified severe protein-calorie malnutrition: Secondary | ICD-10-CM | POA: Insufficient documentation

## 2021-10-23 DIAGNOSIS — E119 Type 2 diabetes mellitus without complications: Secondary | ICD-10-CM | POA: Diagnosis present

## 2021-10-23 DIAGNOSIS — K7469 Other cirrhosis of liver: Secondary | ICD-10-CM | POA: Diagnosis not present

## 2021-10-23 DIAGNOSIS — R188 Other ascites: Secondary | ICD-10-CM

## 2021-10-23 DIAGNOSIS — K7460 Unspecified cirrhosis of liver: Secondary | ICD-10-CM | POA: Diagnosis present

## 2021-10-23 DIAGNOSIS — Z7189 Other specified counseling: Secondary | ICD-10-CM | POA: Diagnosis not present

## 2021-10-23 DIAGNOSIS — N179 Acute kidney failure, unspecified: Secondary | ICD-10-CM | POA: Diagnosis present

## 2021-10-23 DIAGNOSIS — K767 Hepatorenal syndrome: Secondary | ICD-10-CM | POA: Diagnosis present

## 2021-10-23 DIAGNOSIS — R160 Hepatomegaly, not elsewhere classified: Secondary | ICD-10-CM | POA: Diagnosis not present

## 2021-10-23 DIAGNOSIS — E871 Hypo-osmolality and hyponatremia: Secondary | ICD-10-CM | POA: Diagnosis present

## 2021-10-23 DIAGNOSIS — I81 Portal vein thrombosis: Secondary | ICD-10-CM | POA: Diagnosis present

## 2021-10-23 DIAGNOSIS — I8510 Secondary esophageal varices without bleeding: Secondary | ICD-10-CM | POA: Diagnosis present

## 2021-10-23 DIAGNOSIS — K769 Liver disease, unspecified: Secondary | ICD-10-CM | POA: Diagnosis not present

## 2021-10-23 DIAGNOSIS — R791 Abnormal coagulation profile: Secondary | ICD-10-CM | POA: Diagnosis present

## 2021-10-23 DIAGNOSIS — Z6826 Body mass index (BMI) 26.0-26.9, adult: Secondary | ICD-10-CM

## 2021-10-23 DIAGNOSIS — K766 Portal hypertension: Secondary | ICD-10-CM | POA: Diagnosis not present

## 2021-10-23 DIAGNOSIS — Z20822 Contact with and (suspected) exposure to covid-19: Secondary | ICD-10-CM | POA: Diagnosis present

## 2021-10-23 DIAGNOSIS — K652 Spontaneous bacterial peritonitis: Secondary | ICD-10-CM

## 2021-10-23 DIAGNOSIS — C220 Liver cell carcinoma: Secondary | ICD-10-CM

## 2021-10-23 DIAGNOSIS — E872 Acidosis, unspecified: Secondary | ICD-10-CM | POA: Diagnosis present

## 2021-10-23 DIAGNOSIS — F32A Depression, unspecified: Secondary | ICD-10-CM | POA: Diagnosis present

## 2021-10-23 DIAGNOSIS — R609 Edema, unspecified: Secondary | ICD-10-CM

## 2021-10-23 DIAGNOSIS — Z7984 Long term (current) use of oral hypoglycemic drugs: Secondary | ICD-10-CM

## 2021-10-23 DIAGNOSIS — R600 Localized edema: Secondary | ICD-10-CM

## 2021-10-23 DIAGNOSIS — I8500 Esophageal varices without bleeding: Secondary | ICD-10-CM | POA: Diagnosis not present

## 2021-10-23 DIAGNOSIS — K3189 Other diseases of stomach and duodenum: Secondary | ICD-10-CM | POA: Diagnosis not present

## 2021-10-23 DIAGNOSIS — Z87891 Personal history of nicotine dependence: Secondary | ICD-10-CM

## 2021-10-23 DIAGNOSIS — K7581 Nonalcoholic steatohepatitis (NASH): Secondary | ICD-10-CM | POA: Diagnosis present

## 2021-10-23 DIAGNOSIS — Z79899 Other long term (current) drug therapy: Secondary | ICD-10-CM

## 2021-10-23 DIAGNOSIS — K317 Polyp of stomach and duodenum: Secondary | ICD-10-CM | POA: Diagnosis not present

## 2021-10-23 DIAGNOSIS — I10 Essential (primary) hypertension: Secondary | ICD-10-CM | POA: Diagnosis present

## 2021-10-23 DIAGNOSIS — M1990 Unspecified osteoarthritis, unspecified site: Secondary | ICD-10-CM | POA: Diagnosis present

## 2021-10-23 DIAGNOSIS — K259 Gastric ulcer, unspecified as acute or chronic, without hemorrhage or perforation: Secondary | ICD-10-CM

## 2021-10-23 DIAGNOSIS — Z66 Do not resuscitate: Secondary | ICD-10-CM | POA: Diagnosis present

## 2021-10-23 DIAGNOSIS — E7220 Disorder of urea cycle metabolism, unspecified: Secondary | ICD-10-CM | POA: Diagnosis not present

## 2021-10-23 DIAGNOSIS — Z515 Encounter for palliative care: Secondary | ICD-10-CM

## 2021-10-23 DIAGNOSIS — K429 Umbilical hernia without obstruction or gangrene: Secondary | ICD-10-CM | POA: Diagnosis present

## 2021-10-23 DIAGNOSIS — D630 Anemia in neoplastic disease: Secondary | ICD-10-CM | POA: Diagnosis present

## 2021-10-23 DIAGNOSIS — I700 Atherosclerosis of aorta: Secondary | ICD-10-CM | POA: Diagnosis present

## 2021-10-23 DIAGNOSIS — D72829 Elevated white blood cell count, unspecified: Secondary | ICD-10-CM

## 2021-10-23 DIAGNOSIS — Z79891 Long term (current) use of opiate analgesic: Secondary | ICD-10-CM

## 2021-10-23 DIAGNOSIS — K635 Polyp of colon: Secondary | ICD-10-CM | POA: Diagnosis present

## 2021-10-23 LAB — CBC WITH DIFFERENTIAL/PLATELET
Band Neutrophils: 3 %
Basophils Absolute: 0 10*3/uL (ref 0.0–0.1)
Basophils Relative: 0 %
Eosinophils Absolute: 0 10*3/uL (ref 0.0–0.5)
Eosinophils Relative: 0 %
HCT: 36.2 % — ABNORMAL LOW (ref 39.0–52.0)
Hemoglobin: 11.6 g/dL — ABNORMAL LOW (ref 13.0–17.0)
Lymphocytes Relative: 2 %
Lymphs Abs: 2.7 10*3/uL (ref 0.7–4.0)
MCH: 28.8 pg (ref 26.0–34.0)
MCHC: 32 g/dL (ref 30.0–36.0)
MCV: 89.8 fL (ref 80.0–100.0)
Metamyelocytes Relative: 2 %
Monocytes Absolute: 2.7 10*3/uL — ABNORMAL HIGH (ref 0.1–1.0)
Monocytes Relative: 2 %
Neutro Abs: 128.9 10*3/uL — ABNORMAL HIGH (ref 1.7–7.7)
Neutrophils Relative %: 91 %
Platelets: 111 10*3/uL — ABNORMAL LOW (ref 150–400)
RBC: 4.03 MIL/uL — ABNORMAL LOW (ref 4.22–5.81)
RDW: 21.1 % — ABNORMAL HIGH (ref 11.5–15.5)
WBC: 137.1 10*3/uL (ref 4.0–10.5)
nRBC: 0 % (ref 0.0–0.2)

## 2021-10-23 LAB — COMPREHENSIVE METABOLIC PANEL
ALT: 37 U/L (ref 0–44)
AST: 77 U/L — ABNORMAL HIGH (ref 15–41)
Albumin: 2.2 g/dL — ABNORMAL LOW (ref 3.5–5.0)
Alkaline Phosphatase: 577 U/L — ABNORMAL HIGH (ref 38–126)
Anion gap: 17 — ABNORMAL HIGH (ref 5–15)
BUN: 35 mg/dL — ABNORMAL HIGH (ref 6–20)
CO2: 22 mmol/L (ref 22–32)
Calcium: 9.5 mg/dL (ref 8.9–10.3)
Chloride: 93 mmol/L — ABNORMAL LOW (ref 98–111)
Creatinine, Ser: 1.39 mg/dL — ABNORMAL HIGH (ref 0.61–1.24)
GFR, Estimated: 58 mL/min — ABNORMAL LOW (ref >60)
Glucose, Bld: 85 mg/dL (ref 70–99)
Potassium: 4.1 mmol/L (ref 3.5–5.1)
Sodium: 132 mmol/L — ABNORMAL LOW (ref 135–145)
Total Bilirubin: 5.5 mg/dL — ABNORMAL HIGH (ref 0.3–1.2)
Total Protein: 5.4 g/dL — ABNORMAL LOW (ref 6.5–8.1)

## 2021-10-23 LAB — GRAM STAIN

## 2021-10-23 LAB — LACTIC ACID, PLASMA
Lactic Acid, Venous: 7.8 mmol/L (ref 0.5–1.9)
Lactic Acid, Venous: 5.3 mmol/L (ref 0.5–1.9)

## 2021-10-23 LAB — RESP PANEL BY RT-PCR (FLU A&B, COVID) ARPGX2
Influenza A by PCR: NEGATIVE
Influenza B by PCR: NEGATIVE
SARS Coronavirus 2 by RT PCR: NEGATIVE

## 2021-10-23 LAB — MRSA NEXT GEN BY PCR, NASAL: MRSA by PCR Next Gen: NOT DETECTED

## 2021-10-23 LAB — BODY FLUID CELL COUNT WITH DIFFERENTIAL
Lymphs, Fluid: 12 %
Monocyte-Macrophage-Serous Fluid: 10 % — ABNORMAL LOW (ref 50–90)
Neutrophil Count, Fluid: 78 % — ABNORMAL HIGH (ref 0–25)
Total Nucleated Cell Count, Fluid: 456 cu mm (ref 0–1000)

## 2021-10-23 LAB — HIV ANTIBODY (ROUTINE TESTING W REFLEX): HIV Screen 4th Generation wRfx: NONREACTIVE

## 2021-10-23 LAB — PROTIME-INR
INR: 1.5 — ABNORMAL HIGH (ref 0.8–1.2)
Prothrombin Time: 17.7 seconds — ABNORMAL HIGH (ref 11.4–15.2)

## 2021-10-23 LAB — AMMONIA: Ammonia: 66 umol/L — ABNORMAL HIGH (ref 9–35)

## 2021-10-23 LAB — PROCALCITONIN: Procalcitonin: 7.26 ng/mL

## 2021-10-23 MED ORDER — FERROUS SULFATE 325 (65 FE) MG PO TABS
325.0000 mg | ORAL_TABLET | Freq: Every day | ORAL | Status: DC
  Administered 2021-10-24 – 2021-10-31 (×8): 325 mg via ORAL
  Filled 2021-10-23 (×8): qty 1

## 2021-10-23 MED ORDER — ONDANSETRON HCL 4 MG/2ML IJ SOLN
4.0000 mg | Freq: Four times a day (QID) | INTRAMUSCULAR | Status: DC | PRN
  Administered 2021-10-28 – 2021-10-29 (×2): 4 mg via INTRAVENOUS
  Filled 2021-10-23 (×2): qty 2

## 2021-10-23 MED ORDER — SODIUM CHLORIDE 0.9 % IV BOLUS
500.0000 mL | Freq: Once | INTRAVENOUS | Status: AC
  Administered 2021-10-23: 500 mL via INTRAVENOUS

## 2021-10-23 MED ORDER — VANCOMYCIN HCL 1500 MG/300ML IV SOLN
1500.0000 mg | Freq: Once | INTRAVENOUS | Status: AC
  Administered 2021-10-23: 1500 mg via INTRAVENOUS
  Filled 2021-10-23: qty 300

## 2021-10-23 MED ORDER — METRONIDAZOLE 500 MG/100ML IV SOLN
500.0000 mg | Freq: Once | INTRAVENOUS | Status: AC
  Administered 2021-10-23: 500 mg via INTRAVENOUS
  Filled 2021-10-23: qty 100

## 2021-10-23 MED ORDER — SODIUM CHLORIDE 0.9 % IV SOLN
2.0000 g | Freq: Once | INTRAVENOUS | Status: AC
  Administered 2021-10-23: 2 g via INTRAVENOUS
  Filled 2021-10-23: qty 2

## 2021-10-23 MED ORDER — SODIUM CHLORIDE 0.9 % IV BOLUS
250.0000 mL | Freq: Once | INTRAVENOUS | Status: AC
  Administered 2021-10-23: 250 mL via INTRAVENOUS

## 2021-10-23 MED ORDER — SODIUM CHLORIDE 0.9% FLUSH
3.0000 mL | INTRAVENOUS | Status: DC | PRN
  Administered 2021-10-24: 3 mL via INTRAVENOUS

## 2021-10-23 MED ORDER — ONDANSETRON HCL 4 MG PO TABS: 4.0000 mg | ORAL_TABLET | Freq: Four times a day (QID) | ORAL | Status: DC | PRN

## 2021-10-23 MED ORDER — SODIUM CHLORIDE 0.9% FLUSH
3.0000 mL | Freq: Two times a day (BID) | INTRAVENOUS | Status: DC
  Administered 2021-10-23 – 2021-10-29 (×10): 3 mL via INTRAVENOUS

## 2021-10-23 MED ORDER — METHOCARBAMOL 500 MG PO TABS: 500.0000 mg | ORAL_TABLET | Freq: Four times a day (QID) | ORAL | Status: DC | PRN

## 2021-10-23 MED ORDER — ACETAMINOPHEN 650 MG RE SUPP: 650.0000 mg | Freq: Four times a day (QID) | RECTAL | Status: DC | PRN

## 2021-10-23 MED ORDER — ALBUMIN HUMAN 25 % IV SOLN
25.0000 g | Freq: Three times a day (TID) | INTRAVENOUS | Status: AC
  Administered 2021-10-23 – 2021-10-24 (×4): 25 g via INTRAVENOUS
  Filled 2021-10-23 (×5): qty 100

## 2021-10-23 MED ORDER — VANCOMYCIN HCL 1000 MG/200ML IV SOLN
1000.0000 mg | Freq: Two times a day (BID) | INTRAVENOUS | Status: DC
  Administered 2021-10-24 – 2021-10-26 (×5): 1000 mg via INTRAVENOUS
  Filled 2021-10-23 (×5): qty 200

## 2021-10-23 MED ORDER — ALBUMIN HUMAN 25 % IV SOLN
25.0000 g | Freq: Three times a day (TID) | INTRAVENOUS | Status: DC
  Administered 2021-10-23: 25 g via INTRAVENOUS
  Filled 2021-10-23: qty 100

## 2021-10-23 MED ORDER — ENSURE ENLIVE PO LIQD
237.0000 mL | Freq: Two times a day (BID) | ORAL | Status: DC
Start: 2021-10-24 — End: 2021-10-31
  Administered 2021-10-24 – 2021-10-29 (×3): 237 mL via ORAL

## 2021-10-23 MED ORDER — SODIUM CHLORIDE 0.9 % IV SOLN
2.0000 g | Freq: Three times a day (TID) | INTRAVENOUS | Status: DC
  Administered 2021-10-23 – 2021-10-26 (×8): 2 g via INTRAVENOUS
  Filled 2021-10-23 (×8): qty 2

## 2021-10-23 MED ORDER — ACETAMINOPHEN 325 MG PO TABS: 650.0000 mg | ORAL_TABLET | Freq: Four times a day (QID) | ORAL | Status: DC | PRN

## 2021-10-23 MED ORDER — TRAMADOL HCL 50 MG PO TABS
50.0000 mg | ORAL_TABLET | Freq: Three times a day (TID) | ORAL | Status: DC | PRN
  Administered 2021-10-24 – 2021-10-30 (×11): 50 mg via ORAL
  Filled 2021-10-23 (×11): qty 1

## 2021-10-23 MED ORDER — PANTOPRAZOLE SODIUM 40 MG PO TBEC
40.0000 mg | DELAYED_RELEASE_TABLET | Freq: Every day | ORAL | Status: DC
  Administered 2021-10-24 – 2021-10-31 (×8): 40 mg via ORAL
  Filled 2021-10-23 (×8): qty 1

## 2021-10-23 MED ORDER — SODIUM CHLORIDE 0.9 % IV SOLN
250.0000 mL | INTRAVENOUS | Status: DC | PRN
  Administered 2021-10-23: 250 mL via INTRAVENOUS

## 2021-10-23 MED ORDER — METRONIDAZOLE 500 MG/100ML IV SOLN
500.0000 mg | Freq: Two times a day (BID) | INTRAVENOUS | Status: DC
  Administered 2021-10-24 – 2021-10-26 (×5): 500 mg via INTRAVENOUS
  Filled 2021-10-23 (×5): qty 100

## 2021-10-23 NOTE — Sepsis Progress Note (Signed)
Notified provider of need to order lactic acid. ° °

## 2021-10-23 NOTE — Progress Notes (Signed)
Pt tolerated left sided paracentesis procedure well today and 3.6 Liters of clear yellow fluid removed and labs collected and taken to lab for processing. PT vital signs remained stable and verbalized understanding of post procedure instructions. PT transported via stretcher at this time to inpatient bed assignment.

## 2021-10-23 NOTE — Progress Notes (Signed)
Pharmacy Antibiotic Note  Lawrence Rogers is a 60 y.o. male admitted on 10/23/2021 with  intra-abdominal infection .  Pharmacy has been consulted for Cefepime dosing.  Plan: Cefepime 2000 mg IV every 8 hours. Monitor labs, c/s, and patient improvement.     Temp (24hrs), Avg:97.5 F (36.4 C), Min:97.5 F (36.4 C), Max:97.5 F (36.4 C)  Recent Labs  Lab 10/23/21 1044  WBC 137.1*  CREATININE 1.39*    CrCl cannot be calculated (Unknown ideal weight.).    No Known Allergies  Antimicrobials this admission: Cefepime 11/7>> Flagyl 11/7    Microbiology results: 11/7 BCx: pending   Thank you for allowing pharmacy to be a part of this patient's care.  Margot Ables, PharmD Clinical Pharmacist 10/23/2021 1:50 PM

## 2021-10-23 NOTE — Progress Notes (Signed)
Fluid analysis from para reviewed. Cell count resulted later in day.  PMN count greater than 250. Body fluid culture remains pending. Gram stain with gram positive cocci preliminary report. Currently on IV antibiotics per pharmacy. Tailor antibiotics as cultures return.   To help decrease risk of renal decline/failure recommending IV albumin. Patient fits criteria to receive this with elevated BUN and Creatinine, as well as Tbili greater than 4. Recommended dosing is 1.5 g/kg body weight (max dose 100g) day one and repeating IV Albumin on day 3 (1g/kg, max dose 100 grams). Rounds of IV albumin have been ordered.

## 2021-10-23 NOTE — Telephone Encounter (Signed)
He was picked up by EMS this morning and they will more than likely admitt him into the hospital

## 2021-10-23 NOTE — Consult Note (Addendum)
Referring Provider: [facility] Primary Care Physician:  [name], MD Primary Gastroenterologist:  Dr. Laural Golden   Date of Admission: 10/23/21 Date of Consultation: 10/23/21  Reason for Consultation:  Cirrhosis   HPI:  Lawrence Rogers is a 60 y.o. 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 Last seen in Aug 2022 by [facility]. Not a candidate for transplant at that time due to low MELD. Last imaging June 2021 via [place]. Has postponed EGD surveillance due to finances. Presented today to ED after fall in bathroom at home. Noted edema in legs and swollen abdomen. Profound leukocytosis with WBC count 137. Worsened LFTs from baseline with alk phos 577, Tbili 5.5, AST 77. Creatinine 1.39, with a baseline in the 0.8-1 range historically. INR 1.5. CT without IV or oral contrast completed with findings of known cirrhosis, portal venous hypertension with moderate ascites, and hypo-attenuating liver lesions suspicious for HCC. Right pleural effusion also noted. Unable to exclude infection right lung base. Gastric wall thickening likely due to underdistension, unable to rule out gastritis. Additional findings as below.   Patient notes feeling weak and unsteady for approximately 4 weeks. No confusion or mental status changes. No overt GI bleeding. Denies noting jaundice of skin but did note his urine was dark yellow. He believes he has lost weight but unable to quantify. Aug 2022 was 224 at [facility]. Today no weight on file. Poor appetite for several months. No dysphagia. Denies NSAIDs. Notes he has had chronic RLE edema but LLE edema onset about 3 weeks ago. He doesn't feel like his abdomen is more protuberant than normal. Has taken diuretic therapy intermittently, as he has not felt well.   He was to increase Bumex to 3 mg daily in Aug 2022 at direction of [facility] and continue spironolactone 200 mg daily.    Last Colonoscopy: December  2020--(5) 5 to 7 mm polyps descending, to 7 to 12 mm polyp in sigmoid. Tubular adenomas.    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 Repeat upper endoscopy recommended in 3 months   Past Medical History:  Diagnosis Date   Arthritis    Cirrhosis ([place])    Depression    Diabetes [facility])     Past Surgical History:  Procedure Laterality Date   CATARACT EXTRACTION W/PHACO Left 10/26/2019   Procedure: CATARACT EXTRACTION PHACO AND INTRAOCULAR LENS PLACEMENT LEFT EYE CDE=5.20;  Surgeon: Baruch Goldmann, MD;  Location: [facility] ORS;  Service: Ophthalmology;  Laterality: Left;  left   CATARACT EXTRACTION W/PHACO Right 11/09/2019   Procedure: CATARACT EXTRACTION PHACO AND INTRAOCULAR LENS PLACEMENT (IOC);  Surgeon: Baruch Goldmann, MD;  Location: [facility];  Service: Ophthalmology;  Laterality: Right;  CDE: 3.03   COLONOSCOPY WITH PROPOFOL N/A 12/04/2019   Procedure: COLONOSCOPY WITH PROPOFOL;  Surgeon: Rogene Houston, MD;  Location: [facility];  Service: Endoscopy;  Laterality: N/A;   ESOPHAGEAL BANDING N/A 08/29/2018   Procedure: ESOPHAGEAL BANDING;  Surgeon: Rogene Houston, MD;  [facility];  Service: Endoscopy;  Laterality: N/A;   ESOPHAGEAL BANDING  10/24/2018   Procedure: ESOPHAGEAL BANDING;  Surgeon: Rogene Houston, MD;  [facility];  Service: Endoscopy;;  3 bands   ESOPHAGEAL BANDING  12/04/2019   Procedure: ESOPHAGEAL BANDING;  Surgeon: Rogene Houston, MD;  Location: [facility];  Service: Endoscopy;;   ESOPHAGOGASTRODUODENOSCOPY (EGD) WITH PROPOFOL N/A 08/29/2018   Procedure: ESOPHAGOGASTRODUODENOSCOPY (EGD) WITH PROPOFOL;  Surgeon: Rogene Houston, MD;  [facility];  Service: Endoscopy;  Laterality: N/A;  9:30   ESOPHAGOGASTRODUODENOSCOPY (EGD) WITH PROPOFOL N/A 10/24/2018   Procedure:  ESOPHAGOGASTRODUODENOSCOPY (EGD) WITH PROPOFOL;  Surgeon: Rogene Houston, MD;  [facility];  Service: Endoscopy;  Laterality: N/A;  7:30   ESOPHAGOGASTRODUODENOSCOPY (EGD) WITH PROPOFOL N/A 12/04/2019   Procedure: ESOPHAGOGASTRODUODENOSCOPY (EGD) WITH PROPOFOL;  Surgeon: Rogene Houston, MD;  [facility];  Service: Endoscopy;  Laterality: N/A;   IR PARACENTESIS  04/18/2018   IR TRANSCATHETER BX  04/18/2018   IR US GUIDE VASC ACCESS RIGHT  04/18/2018   IR VENOGRAM HEPATIC W HEMODYNAMIC EVALUATION  04/18/2018   PLEURAL EFFUSION DRAINAGE Right 07/22/2019   Procedure: Drainage Of Pleural Effusion;  Surgeon: Lajuana Matte, MD;  Location: [facility] OR;  Service: Thoracic;  Laterality: Right;   POLYPECTOMY  12/04/2019   Procedure: POLYPECTOMY;  Surgeon: Rogene Houston, MD;  [facility];  Service: Endoscopy;;  hepatic flexure, cecal, proximal transverse colon   SHOULDER ARTHROSCOPY WITH ROTATOR CUFF REPAIR Right 08/04/2021   Procedure: SHOULDER ARTHROSCOPY WITH ROTATOR CUFF REPAIR,EXTENSIVE DEBRIDEMENT, SUBACROMIAL DECOMPRESSION,DISTAL CLAVICLE RESCTION;  Surgeon: Nicholes Stairs, MD;  Location: [facility];  Service: Orthopedics;  Laterality: Right;  182mn   VIDEO ASSISTED THORACOSCOPY (VATS)/DECORTICATION Right 07/22/2019   Procedure: VIDEO ASSISTED THORACOSCOPY (VATS)/DECORTICATION;  Surgeon: LLajuana Matte MD;  [place]  Service: Thoracic;  Laterality: Right;    Prior to Admission medications   Medication Sig Start Date End Date Taking? Authorizing Provider  atorvastatin (LIPITOR) 20 MG tablet Take 20 mg by mouth daily.   Yes [provider]  bumetanide (BUMEX) 2 MG tablet Take 2 mg by mouth daily. 07/19/21  Yes [provider]  cholecalciferol (VITAMIN D3) 25 MCG (1000 UNIT) tablet Take 1,000 Units by mouth daily.   Yes [provider]  ferrous sulfate 325 (65 FE) MG tablet Take 1 tablet (325 mg total) by mouth daily with  breakfast. 10/25/19  Yes Rehman, NMechele Dawley MD  glimepiride (AMARYL) 2 MG tablet Take 2 mg by mouth daily. 10/09/19  Yes [provider]  metFORMIN (GLUCOPHAGE-XR) 500 MG 24 hr tablet Take 2 tablets (1,000 mg total) by mouth 2 (two) times daily. 12/05/19  Yes Rehman, NMechele Dawley MD  methocarbamol (ROBAXIN) 500 MG tablet Take 1 tablet (500 mg total) by mouth every 6 (six) hours as needed for muscle spasms. 08/04/21  Yes RNicholes Stairs MD  ondansetron (ZOFRAN) 4 MG tablet Take 1 tablet (4 mg total) by mouth every 8 (eight) hours as needed for nausea or vomiting. 08/04/21 08/04/22 Yes RNicholes Stairs MD  pantoprazole (PROTONIX) 40 MG tablet Take 1 tablet (40 mg total) by mouth 2 (two) times daily. TAKE ONE TABLET (40MG TOTAL) BY MOUTH DAILY BEFORE BREAKFAST Patient taking differently: Take 40 mg by mouth daily before breakfast. 03/10/20  Yes Rehman, NMechele Dawley MD  spironolactone (ALDACTONE) 100 MG tablet Take 2 tablets (200 mg total) by mouth daily. 07/30/19  Yes AAline August MD  traMADol (ULTRAM) 50 MG tablet Take 50 mg by mouth 3 (three) times daily as needed for moderate pain.   Yes [provider]  oxyCODONE (ROXICODONE) 5 MG immediate release tablet Take 1 tablet (5 mg total) by mouth every 6 (six) hours as needed for severe pain. Patient not taking:  No sig reported 08/04/21 08/04/22  Nicholes Stairs, MD    Current Facility-Administered Medications  Medication Dose Route Frequency Provider Last Rate Last Admin   albumin human 25 % solution 12.5 g  12.5 g Intravenous Once Rehman, Najeeb U, MD       ceFEPIme (MAXIPIME) 2 g in sodium chloride 0.9 % 100 mL IVPB  2 g Intravenous Q8H Shah, Pratik D, DO       metroNIDAZOLE (FLAGYL) IVPB 500 mg  500 mg Intravenous Once Milton Ferguson, MD 100 mL/hr at 10/23/21 1252 500 mg at 10/23/21 1252   Current Outpatient Medications  Medication Sig Dispense Refill   atorvastatin (LIPITOR) 20 MG tablet Take 20 mg by mouth daily.      bumetanide (BUMEX) 2 MG tablet Take 2 mg by mouth daily.     cholecalciferol (VITAMIN D3) 25 MCG (1000 UNIT) tablet Take 1,000 Units by mouth daily.     ferrous sulfate 325 (65 FE) MG tablet Take 1 tablet (325 mg total) by mouth daily with breakfast.     glimepiride (AMARYL) 2 MG tablet Take 2 mg by mouth daily.     metFORMIN (GLUCOPHAGE-XR) 500 MG 24 hr tablet Take 2 tablets (1,000 mg total) by mouth 2 (two) times daily.  2   methocarbamol (ROBAXIN) 500 MG tablet Take 1 tablet (500 mg total) by mouth every 6 (six) hours as needed for muscle spasms. 45 tablet 1   ondansetron (ZOFRAN) 4 MG tablet Take 1 tablet (4 mg total) by mouth every 8 (eight) hours as needed for nausea or vomiting. 30 tablet 1   pantoprazole (PROTONIX) 40 MG tablet Take 1 tablet (40 mg total) by mouth 2 (two) times daily. TAKE ONE TABLET (40MG TOTAL) BY MOUTH DAILY BEFORE BREAKFAST (Patient taking differently: Take 40 mg by mouth daily before breakfast.) 60 tablet 5   spironolactone (ALDACTONE) 100 MG tablet Take 2 tablets (200 mg total) by mouth daily.     traMADol (ULTRAM) 50 MG tablet Take 50 mg by mouth 3 (three) times daily as needed for moderate pain.     oxyCODONE (ROXICODONE) 5 MG immediate release tablet Take 1 tablet (5 mg total) by mouth every 6 (six) hours as needed for severe pain. (Patient not taking: No sig reported) 22 tablet 0    Allergies as of 10/23/2021   (No Known Allergies)    History reviewed. No pertinent family history.  Social History   Socioeconomic History   Marital status: Single    Spouse name: Not on file   Number of children: Not on file   Years of education: Not on file   Highest education level: Not on file  Occupational History   Not on file  Tobacco Use   Smoking status: Former    Packs/day: 0.25    Years: 15.00    Pack years: 3.75    Types: Cigarettes    Quit date: 11/30/1994    Years since quitting: 26.9   Smokeless tobacco: Never  Vaping Use   Vaping Use: Never used   Substance and Sexual Activity   Alcohol use: Never    Comment: Has not drank in 51 yrs.    Drug use: Never   Sexual activity: Not Currently  Other Topics Concern   Not on file  Social History Narrative   Not on file   Social Determinants of Health   Financial Resource Strain: Not on file  Food Insecurity: Not on file  Transportation Needs: Not on file  Physical Activity: Not on file  Stress: Not on file  Social Connections: Not on file  Intimate Partner Violence: Not on file    Review of Systems: Gen: see HPI CV: Denies chest pain, heart palpitations, syncope, edema  Resp: Denies shortness of breath with rest, cough, wheezing GI: see HPI GU : Denies urinary burning, urinary frequency, urinary incontinence.  MS: see HPI Derm: Denies rash, itching, dry skin Psych: Denies depression, anxiety,confusion, or memory loss Heme: see HPI  Physical Exam: Vital signs in last 24 hours: Temp:  [97.5 F (36.4 C)] 97.5 F (36.4 C) (11/07 0952) Pulse Rate:  [106-114] 106 (11/07 1330) Resp:  [18-22] 22 (11/07 1330) BP: (105-121)/(43-65) 121/65 (11/07 1330) SpO2:  [92 %-97 %] 94 % (11/07 1330)   General:   Alert and oriented X 4, chronically ill-appearing. Muscle wasting bilateral upper extremities and thin. Sallow-appearing complexion.  Head:  Normocephalic and atraumatic. Eyes:  mild iterus Ears:  Normal auditory acuity. Nose:  No deformity, discharge,  or lesions. Mouth:  edentulous Lungs:  Clear throughout to auscultation. Diminished bases Heart:  S1 S2 present  Abdomen:  Protuberant and distended but soft with non-tense ascites. Umbilical hernia easily reducible. No TTP. No obvious HSM able to be appreciated due to distended abdomen Rectal:  Deferred   Extremities:  With massive pedal edema and 3+ pitting lower extremity edema to knee bilaterally Neurologic:  Alert and  oriented x4;  negative asterixis  Psych:  Alert and cooperative. Normal mood and affect.  Intake/Output  from previous day: No intake/output data recorded. Intake/Output this shift: Total I/O In: 250 [IV Piggyback:250] Out: -   Lab Results: Recent Labs    10/23/21 1044  WBC 137.1*  HGB 11.6*  HCT 36.2*  PLT 111*   BMET Recent Labs    10/23/21 1044  NA 132*  K 4.1  CL 93*  CO2 22  GLUCOSE 85  BUN 35*  CREATININE 1.39*  CALCIUM 9.5   LFT Recent Labs    10/23/21 1044  PROT 5.4*  ALBUMIN 2.2*  AST 77*  ALT 37  ALKPHOS 577*  BILITOT 5.5*   PT/INR Recent Labs    10/23/21 1044  LABPROT 17.7*  INR 1.5*     Studies/Results: CT ABDOMEN PELVIS WO CONTRAST  Result Date: 10/23/2021 CLINICAL DATA:  Abdominal distension. Fall. Low back pain. Cirrhosis. Diabetes. EXAM: CT ABDOMEN AND PELVIS WITHOUT CONTRAST TECHNIQUE: Multidetector CT imaging of the abdomen and pelvis was performed following the standard protocol without IV contrast. COMPARISON:  05/20/2020 abdominal ultrasound. 10/07/2018 abdominopelvic CT. FINDINGS: Lower chest: right greater than left dependent lower lobe airspace disease. A 3 mm lingular nodule on 06/05 is not readily apparent on the prior. Small right pleural effusion is new. Normal heart size. Bilateral gynecomastia. Marked periesophageal varices. Hepatobiliary: Subtle hypoattenuating right hepatic lobe mass including at 8.2 x 8.8 cm, suboptimally evaluated on this noncontrast CT. Example 19/3. Likely separate more inferior right hepatic lobe hypoattenuating lesion of 2.2 cm on 30/3. Normal gallbladder, without biliary ductal dilatation. Pancreas: Pancreatic atrophy, without duct dilatation. Spleen: Splenomegaly, including at 18.4 cm craniocaudal. Adrenals/Urinary Tract: Normal adrenal glands. Bilateral renal collecting system calculi, maximally 1.0 cm in the interpolar left kidney. No hydronephrosis. No hydroureter or ureteric calculi. No bladder calculi. Stomach/Bowel: The gastric body is underdistended but appears thick walled on 22/3. The colon is  underdistended and poorly evaluated. Normal small bowel caliber. Vascular/Lymphatic: Aortic atherosclerosis. Extensive perigastric varices. Small abdominal retroperitoneal nodes are likely reactive in the setting  of cirrhosis. Index precaval 10 mm node on 38/3 is similar. No pelvic sidewall adenopathy. Reproductive: Normal prostate. Other: Moderate volume abdominopelvic ascites. No free intraperitoneal air. Musculoskeletal: No acute osseous abnormality. IMPRESSION: 1. No posttraumatic deformity identified. 2. Cirrhosis and portal venous hypertension with moderate volume ascites. Hypoattenuating liver lesions, suspicious for multifocal hepatocellular carcinoma. When the patient is clinically stable and able to follow directions and hold their breath (preferably as an outpatient) further evaluation with dedicated abdominal MRI should be considered. 3. Right pleural effusion with bibasilar airspace disease. Most likely atelectasis. At the right lung base, infection cannot be excluded. 4. Bilateral nephrolithiasis. 5. Gastric wall thickening in the setting of underdistention. Recommend clinical exclusion of gastritis. Hypoalbuminemia could create a similar appearance. 6. 3 mm lingular nodule. No follow-up needed if patient is low-risk. Non-contrast chest CT can be considered in 12 months if patient is high-risk. This recommendation follows the consensus statement: Guidelines for Management of Incidental Pulmonary Nodules Detected on CT Images: From the Fleischner Society 2017; Radiology 2017; 284:228-243. 7. Bilateral gynecomastia. These results were called by telephone at the time of interpretation on 10/23/2021 at 12:37 pm to provider [facility] ZAMMIT , who verbally acknowledged these results. Electronically Signed   By: Abigail Miyamoto M.D.   On: 10/23/2021 12:38   DG Chest Port 1 View  Result Date: 10/23/2021 CLINICAL DATA:  Weakness EXAM: PORTABLE CHEST 1 VIEW COMPARISON:  Chest x-ray 09/22/2021 FINDINGS: Heart size  is upper normal. Mediastinum appears stable. Mild calcified plaques in the aortic arch. Mild pulmonary vascular/interstitial prominence. Small left and moderate right pleural effusion with associated atelectasis/infiltrate. No pneumothorax. IMPRESSION: Bilateral pleural effusions right greater than left. Mild pulmonary vascular prominence. Electronically Signed   By: Ofilia Neas M.D.   On: 10/23/2021 11:56    Impression: Lawrence Rogers is a 60 y.o. 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 Presenting now with acutely decompensated disease with ascites, anasarca, worsening renal function, profound leukocytosis with WBC count 137, and CT findings concerning for [place]. Oncology has been consulted due to WBC count and recommending antibiotics. MELD Na 24 with today's values.   He is without any signs of encephalopathy at this time. Notable ascites on exam and lower extremity edema in setting of hypoalbuminemia. Notably, he has not been taking diuretics as prescribed (previously Bumex 3 mg and spironolactone 200 mg) as he has not felt well. Will order [place] para with fluid analysis for both diagnostic and therapeutic purposes. Doppler US of liver tomorrow to evaluate patency of portal vein, especially in light of tumor burden.   Elevated LFTs: notable change from baseline. Suspected due to liver lesions.Check AFP. I note that AFP in Aug 2022 at [facility] was 9.3 and previously 4 in 2019. Outpatient MRI for dedicated imaging.   Notable leukocytosis: Oncology on board. Empiric antibiotics. Doubt SBP but para has been ordered with fluid analysis.   Gastric wall thickening on CT: suspect underdistension. Can pursue EGD as outpatient as he is overdue for variceal surveillance (Last in 2020).     Plan: [place] para today with fluid analysis AFP tumor marker Follow HFP, INR, CBC Doppler  US in am Empiric antibiotics Blood cultures pending Appreciate Oncology assistance Will reassess in am   Annitta Needs, PhD, ANP-BC [facility]      LOS: 0 days    10/23/2021, 1:49 PM

## 2021-10-23 NOTE — ED Triage Notes (Signed)
Pt here from home via ems with c/o fall in the bathroom ,no loc , pt is c/o low back , did not hit head, no blood thinners, was on the floor for 4-5 hrs

## 2021-10-23 NOTE — Progress Notes (Signed)
Pharmacy Antibiotic Note  Lawrence Rogers is a 60 y.o. male admitted on 10/23/2021 with  intra-abdominal infection .  Pharmacy has been consulted for Cefepime dosing. Vancomycin protocol added  Plan: Cefepime 2000 mg IV every 8 hours. Monitor labs, c/s, and patient improvement. Vancomycin 1560m x 1 then 10047mIV q12hrs AUC: 461  Weight: 101 kg (222 lb 10.6 oz)  Temp (24hrs), Avg:97.6 F (36.4 C), Min:97.5 F (36.4 C), Max:97.8 F (36.6 C)  Recent Labs  Lab 10/23/21 1044 10/23/21 1253  WBC 137.1*  --   CREATININE 1.39*  --   LATICACIDVEN  --  7.8*     Estimated Creatinine Clearance: 72.6 mL/min (A) (by C-G formula based on SCr of 1.39 mg/dL (H)).    No Known Allergies  Antimicrobials this admission: Cefepime 11/7>> Flagyl 11/7  Vancomycin 11/7 >>   Microbiology results: 11/7 BCx: pending   Thank you for allowing pharmacy to be a part of this patient's care.  StMargot AblesPharmD Clinical Pharmacist 10/23/2021 5:33 PM

## 2021-10-23 NOTE — Procedures (Signed)
PreOperative Dx: Cirrhosis, ascites Postoperative Dx: Cirrhosis, ascites Procedure:   US guided paracentesis Radiologist:  Thornton Papas Anesthesia:  10 ml of1% lidocaine Specimen:  3.6 L of yellow ascitic fluid EBL:   < 1 ml Complications: None

## 2021-10-23 NOTE — H&P (Signed)
History and Physical  [name]:[id number] DOB: 1961/04/29 DOA: 10/23/2021  PCP: [name], MD  Patient coming from: Home Level of care: Telemetry  I have personally briefly reviewed patient's old medical records in [place]  Chief Complaint: Fall and leukocytosis concerning for malignancy  HPI: Lawrence Rogers is a 60 y.o. male with medical history significant of cirrhosis due to fatty liver disease, hypertension, and recent shoulder surgery 08/05/2019 who presents to the ED due to fall sustained last night.  Patient states that he was using the bathroom last night and when he tried to get up he fell.  He states that he landed on his left hip; he denies hitting his head or loss of consciousness.  Patient denies dizziness or syncope.  He was unable to get up.  He was on the floor for several hours until his stepdaughter found him on at 9 AM.  EMS was called patient was brought to the emergency department.  Upon presentation to the ED, lower extremity edema and ascites present.  Patient states that he has not missed a few doses of his home Lasix.  Labs were collected upon presentation.  Patient was found to have a leukocytosis with a WBC of 137.1 with a neutrophilic predominance.  Patient denies any radiation exposure aside from x-rays in the past.  Patient denies recent fevers or sickness.  Patient has not been consistent with home lactulose: Current ammonia 66.    ED Course: While in the ED, temp 97.5, heart rate 113, BP 114/51, O2 sat 95 on room air. EKG pending CT abdomen and pelvis showed no posttraumatic deformities.  It also shows signs of cirrhosis and bilateral kidney stones. ED labs showed a leukocytosis of 629.5 with neutrophilic predominance. Patient received cefepime, Flagyl while in the ED.    Review of Systems: Review of Systems  Constitutional:  Negative for chills and fever.  HENT: Negative.    Eyes:  Negative for blurred vision  and double vision.  Respiratory: Negative.    Cardiovascular:  Positive for leg swelling. Negative for chest pain and palpitations.  Gastrointestinal: Negative.   Genitourinary: Negative.   Musculoskeletal:  Positive for falls and joint pain (Left hip pain).  Skin: Negative.   Neurological:  Positive for weakness. Negative for dizziness, seizures, loss of consciousness and headaches.  Endo/Heme/Allergies: Negative.   Psychiatric/Behavioral: Negative.  Negative for depression.     Past Medical History:  Diagnosis Date   Arthritis    Cirrhosis ([place])    Depression    Diabetes [facility])     Past Surgical History:  Procedure Laterality Date   CATARACT EXTRACTION W/PHACO Left 10/26/2019   Procedure: CATARACT EXTRACTION PHACO AND INTRAOCULAR LENS PLACEMENT LEFT EYE CDE=5.20;  Surgeon: Baruch Goldmann, MD;  Location: [facility];  Service: Ophthalmology;  Laterality: Left;  left   CATARACT EXTRACTION W/PHACO Right 11/09/2019   Procedure: CATARACT EXTRACTION PHACO AND INTRAOCULAR LENS PLACEMENT (IOC);  Surgeon: Baruch Goldmann, MD;  Location: [facility];  Service: Ophthalmology;  Laterality: Right;  CDE: 3.03   COLONOSCOPY WITH PROPOFOL N/A 12/04/2019   Procedure: COLONOSCOPY WITH PROPOFOL;  Surgeon: Rogene Houston, MD;  Location: [facility];  Service: Endoscopy;  Laterality: N/A;   ESOPHAGEAL BANDING N/A 08/29/2018   Procedure: ESOPHAGEAL BANDING;  Surgeon: Rogene Houston, MD;  [facility];  Service: Endoscopy;  Laterality: N/A;   ESOPHAGEAL BANDING  10/24/2018   Procedure: ESOPHAGEAL BANDING;  Surgeon: Hildred Laser  U, MD;  [facility];  Service: Endoscopy;;  3 bands   ESOPHAGEAL BANDING  12/04/2019   Procedure: ESOPHAGEAL BANDING;  Surgeon: Rogene Houston, MD;  [facility];  Service: Endoscopy;;   ESOPHAGOGASTRODUODENOSCOPY (EGD) WITH PROPOFOL N/A 08/29/2018   Procedure: ESOPHAGOGASTRODUODENOSCOPY (EGD) WITH PROPOFOL;  Surgeon: Rogene Houston, MD;  [facility]  [facility];  Service: Endoscopy;  Laterality: N/A;  9:30   ESOPHAGOGASTRODUODENOSCOPY (EGD) WITH PROPOFOL N/A 10/24/2018   Procedure: ESOPHAGOGASTRODUODENOSCOPY (EGD) WITH PROPOFOL;  Surgeon: Rogene Houston, MD;  [facility];  Service: Endoscopy;  Laterality: N/A;  7:30   ESOPHAGOGASTRODUODENOSCOPY (EGD) WITH PROPOFOL N/A 12/04/2019   Procedure: ESOPHAGOGASTRODUODENOSCOPY (EGD) WITH PROPOFOL;  Surgeon: Rogene Houston, MD;  [facility];  Service: Endoscopy;  Laterality: N/A;   IR PARACENTESIS  04/18/2018   IR TRANSCATHETER BX  04/18/2018   IR US GUIDE VASC ACCESS RIGHT  04/18/2018   IR VENOGRAM HEPATIC W HEMODYNAMIC EVALUATION  04/18/2018   PLEURAL EFFUSION DRAINAGE Right 07/22/2019   Procedure: Drainage Of Pleural Effusion;  Surgeon: Lajuana Matte, MD;  [facility];  Service: Thoracic;  Laterality: Right;   POLYPECTOMY  12/04/2019   Procedure: POLYPECTOMY;  Surgeon: Rogene Houston, MD;  [facility];  Service: Endoscopy;;  hepatic flexure, cecal, proximal transverse colon   SHOULDER ARTHROSCOPY WITH ROTATOR CUFF REPAIR Right 08/04/2021   Procedure: SHOULDER ARTHROSCOPY WITH ROTATOR CUFF REPAIR,EXTENSIVE DEBRIDEMENT, SUBACROMIAL DECOMPRESSION,DISTAL CLAVICLE RESCTION;  Surgeon: Nicholes Stairs, MD;  Location: [facility];  Service: Orthopedics;  Laterality: Right;  132mn   VIDEO ASSISTED THORACOSCOPY (VATS)/DECORTICATION Right 07/22/2019   Procedure: VIDEO ASSISTED THORACOSCOPY (VATS)/DECORTICATION;  Surgeon: LLajuana Matte MD;  Location: [facility]  Service: Thoracic;  Laterality: Right;     reports that he quit smoking about 26 years ago. His smoking use included cigarettes. He has a 3.75 pack-year smoking history. He has never used smokeless tobacco. He reports that he does not drink alcohol and does not use drugs.  No Known Allergies  History reviewed. No pertinent family history.  Prior to Admission medications   Medication Sig Start Date  End Date Taking? Authorizing Provider  atorvastatin (LIPITOR) 20 MG tablet Take 20 mg by mouth daily.   Yes [provider]  bumetanide (BUMEX) 2 MG tablet Take 2 mg by mouth daily. 07/19/21  Yes [provider]  cholecalciferol (VITAMIN D3) 25 MCG (1000 UNIT) tablet Take 1,000 Units by mouth daily.   Yes [provider]  ferrous sulfate 325 (65 FE) MG tablet Take 1 tablet (325 mg total) by mouth daily with breakfast. 10/25/19  Yes Rehman, NMechele Dawley MD  glimepiride (AMARYL) 2 MG tablet Take 2 mg by mouth daily. 10/09/19  Yes [provider]  metFORMIN (GLUCOPHAGE-XR) 500 MG 24 hr tablet Take 2 tablets (1,000 mg total) by mouth 2 (two) times daily. 12/05/19  Yes Rehman, NMechele Dawley MD  methocarbamol (ROBAXIN) 500 MG tablet Take 1 tablet (500 mg total) by mouth every 6 (six) hours as needed for muscle spasms. 08/04/21  Yes RNicholes Stairs MD  ondansetron (ZOFRAN) 4 MG tablet Take 1 tablet (4 mg total) by mouth every 8 (eight) hours as needed for nausea or vomiting. 08/04/21 08/04/22 Yes RNicholes Stairs MD  pantoprazole (PROTONIX) 40 MG tablet Take 1 tablet (40 mg total) by mouth 2 (two) times daily. TAKE ONE TABLET (40MG TOTAL) BY MOUTH DAILY BEFORE BREAKFAST Patient taking differently: Take 40 mg  by mouth daily before breakfast. 03/10/20  Yes Rehman, Mechele Dawley, MD  spironolactone (ALDACTONE) 100 MG tablet Take 2 tablets (200 mg total) by mouth daily. 07/30/19  Yes Aline August, MD  traMADol (ULTRAM) 50 MG tablet Take 50 mg by mouth 3 (three) times daily as needed for moderate pain.   Yes [provider]  oxyCODONE (ROXICODONE) 5 MG immediate release tablet Take 1 tablet (5 mg total) by mouth every 6 (six) hours as needed for severe pain. Patient not taking: No sig reported 08/04/21 08/04/22  Nicholes Stairs, MD    Physical Exam: Vitals:   10/23/21 1100 10/23/21 1130 10/23/21 1200 10/23/21 1330  BP: (!) 113/58 (!) 106/43 (!) 118/52 121/65   Pulse: (!) 111 (!) 114 (!) 111 (!) 106  Resp: 20 18 20  (!) 22  Temp:      TempSrc:      SpO2: 92% 94% 94% 94%    Constitutional: Ill-appearing man, NAD, calm and cooperative. Eyes: PERRL, lids and conjunctivae normal ENMT: Mucous membranes are moist. Posterior pharynx clear of any exudate or lesions.Normal dentition.  Neck: normal, supple, no masses, no thyromegaly Respiratory: clear to auscultation bilaterally, no wheezing, no crackles. Normal respiratory effort. No accessory muscle use.  Cardiovascular: Tachycardic normal s1, s2 sounds, no murmurs / rubs / gallops.  +3 edema present bilaterally.  Abdomen: Abdomen distended, ascites present.  Nontender abdomen.   Musculoskeletal: no clubbing / cyanosis. No joint deformity upper and lower extremities. Normal muscle tone.  Skin: no rashes, lesions, ulcers. No induration Neurologic: CN 2-12 grossly intact. Sensation intact, DTR normal. Strength 5/5 in all 4.  Psychiatric: Normal judgment and insight. Alert and oriented x 3. Normal mood.   Labs on Admission: I have personally reviewed following labs and imaging studies  CBC: Recent Labs  Lab 10/23/21 1044  WBC 137.1*  NEUTROABS 128.9*  HGB 11.6*  HCT 36.2*  MCV 89.8  PLT 144*   Basic Metabolic Panel: Recent Labs  Lab 10/23/21 1044  NA 132*  K 4.1  CL 93*  CO2 22  GLUCOSE 85  BUN 35*  CREATININE 1.39*  CALCIUM 9.5   GFR: CrCl cannot be calculated (Unknown ideal weight.). Liver Function Tests: Recent Labs  Lab 10/23/21 1044  AST 77*  ALT 37  ALKPHOS 577*  BILITOT 5.5*  PROT 5.4*  ALBUMIN 2.2*   No results for input(s): LIPASE, AMYLASE in the last 168 hours. Recent Labs  Lab 10/23/21 1045  AMMONIA 66*   Coagulation Profile: Recent Labs  Lab 10/23/21 1044  INR 1.5*   Cardiac Enzymes: No results for input(s): CKTOTAL, CKMB, CKMBINDEX, TROPONINI in the last 168 hours. BNP (last 3 results) No results for input(s): PROBNP in the last 8760  hours. HbA1C: No results for input(s): HGBA1C in the last 72 hours. CBG: No results for input(s): GLUCAP in the last 168 hours. Lipid Profile: No results for input(s): CHOL, HDL, LDLCALC, TRIG, CHOLHDL, LDLDIRECT in the last 72 hours. Thyroid Function Tests: No results for input(s): TSH, T4TOTAL, FREET4, T3FREE, THYROIDAB in the last 72 hours. Anemia Panel: No results for input(s): VITAMINB12, FOLATE, FERRITIN, TIBC, IRON, RETICCTPCT in the last 72 hours. Urine analysis:    Component Value Date/Time   COLORURINE YELLOW 07/19/2019 1553   APPEARANCEUR HAZY (A) 07/19/2019 1553   LABSPEC 1.036 (H) 07/19/2019 1553   PHURINE 5.0 07/19/2019 1553   GLUCOSEU NEGATIVE 07/19/2019 1553   HGBUR SMALL (A) 07/19/2019 1553   BILIRUBINUR NEGATIVE 07/19/2019 1553   KETONESUR NEGATIVE  07/19/2019 [place] 07/19/2019 1553   NITRITE NEGATIVE 07/19/2019 1553   LEUKOCYTESUR TRACE (A) 07/19/2019 1553    Radiological Exams on Admission: CT ABDOMEN PELVIS WO CONTRAST  Result Date: 10/23/2021 CLINICAL DATA:  Abdominal distension. Fall. Low back pain. Cirrhosis. Diabetes. EXAM: CT ABDOMEN AND PELVIS WITHOUT CONTRAST TECHNIQUE: Multidetector CT imaging of the abdomen and pelvis was performed following the standard protocol without IV contrast. COMPARISON:  05/20/2020 abdominal ultrasound. 10/07/2018 abdominopelvic CT. FINDINGS: Lower chest: right greater than left dependent lower lobe airspace disease. A 3 mm lingular nodule on 06/05 is not readily apparent on the prior. Small right pleural effusion is new. Normal heart size. Bilateral gynecomastia. Marked periesophageal varices. Hepatobiliary: Subtle hypoattenuating right hepatic lobe mass including at 8.2 x 8.8 cm, suboptimally evaluated on this noncontrast CT. Example 19/3. Likely separate more inferior right hepatic lobe hypoattenuating lesion of 2.2 cm on 30/3. Normal gallbladder, without biliary ductal dilatation. Pancreas: Pancreatic atrophy,  without duct dilatation. Spleen: Splenomegaly, including at 18.4 cm craniocaudal. Adrenals/Urinary Tract: Normal adrenal glands. Bilateral renal collecting system calculi, maximally 1.0 cm in the interpolar left kidney. No hydronephrosis. No hydroureter or ureteric calculi. No bladder calculi. Stomach/Bowel: The gastric body is underdistended but appears thick walled on 22/3. The colon is underdistended and poorly evaluated. Normal small bowel caliber. Vascular/Lymphatic: Aortic atherosclerosis. Extensive perigastric varices. Small abdominal retroperitoneal nodes are likely reactive in the setting of cirrhosis. Index precaval 10 mm node on 38/3 is similar. No pelvic sidewall adenopathy. Reproductive: Normal prostate. Other: Moderate volume abdominopelvic ascites. No free intraperitoneal air. Musculoskeletal: No acute osseous abnormality. IMPRESSION: 1. No posttraumatic deformity identified. 2. Cirrhosis and portal venous hypertension with moderate volume ascites. Hypoattenuating liver lesions, suspicious for multifocal hepatocellular carcinoma. When the patient is clinically stable and able to follow directions and hold their breath (preferably as an outpatient) further evaluation with dedicated abdominal MRI should be considered. 3. Right pleural effusion with bibasilar airspace disease. Most likely atelectasis. At the right lung base, infection cannot be excluded. 4. Bilateral nephrolithiasis. 5. Gastric wall thickening in the setting of underdistention. Recommend clinical exclusion of gastritis. Hypoalbuminemia could create a similar appearance. 6. 3 mm lingular nodule. No follow-up needed if patient is low-risk. Non-contrast chest CT can be considered in 12 months if patient is high-risk. This recommendation follows the consensus statement: Guidelines for Management of Incidental Pulmonary Nodules Detected on CT Images: From the Fleischner Society 2017; Radiology 2017; 284:228-243. 7. Bilateral gynecomastia.  These results were called by telephone at the time of interpretation on 10/23/2021 at 12:37 pm to provider [facility] ZAMMIT , who verbally acknowledged these results. Electronically Signed   By: Abigail Miyamoto M.D.   On: 10/23/2021 12:38   DG Chest Port 1 View  Result Date: 10/23/2021 CLINICAL DATA:  Weakness EXAM: PORTABLE CHEST 1 VIEW COMPARISON:  Chest x-ray 09/22/2021 FINDINGS: Heart size is upper normal. Mediastinum appears stable. Mild calcified plaques in the aortic arch. Mild pulmonary vascular/interstitial prominence. Small left and moderate right pleural effusion with associated atelectasis/infiltrate. No pneumothorax. IMPRESSION: Bilateral pleural effusions right greater than left. Mild pulmonary vascular prominence. Electronically Signed   By: Ofilia Neas M.D.   On: 10/23/2021 11:56    EKG: EKG pending  Assessment/Plan Active Problems:   Cirrhosis of liver with ascites ([place])   Fall -CT pelvic /abdomen showed no posttraumatic deformities.  Thigh and cirrhosis also present. -EKG pending.  Cirrhosis -Ammonia of 66. INR and PT elevated at 1.5 and 17.7, respectively. -Gastroenterology consulted; recommendations  below -AFP ordered, -Ultrasound paracentesis and fluid analysis ordered -Albumin of 2.2. IV albumin and Lasix ordered. -Ultrasound liver Doppler ordered for tomorrow   Possible sepsis/leukocytosis concerning for malignancy -WBC 137.1 with a neutrophilic predominance: Patient currently afebrile  -Lactic acid 7.8 -Patient received cefepime, Flagyl while in the ED. -Continue IV cefepime -Oncology consulted for patient's leukocytosis  Hyponatremia/ AKI/ hepatorenal syndrome syndrome -Sodium of 132 -Creatinine of 1.39.  3 months ago creatinine of 1.03. -BUN/creatinine ratio > 20.  AKI is prerenal. -Hyponatremia and AKI likely caused by fluid shift due to patient's cirrhosis. -Albumin and Lasix as stated above -Blood cultures collected   DVT prophylaxis: SCDs Code  Status: Full Family Communication: No family at bedside Disposition Plan: 48+  Consults called: Gastroenterology, oncology Admission status: Observation Level of care: Telemetry Valentino Nose Trigger Frasier MS4  10/23/2021, 1:56 PM

## 2021-10-23 NOTE — ED Triage Notes (Signed)
Pt has cirrhosis , legs are swollen along with abd , pt has missed a few doses of his lasix

## 2021-10-23 NOTE — Sepsis Progress Note (Signed)
Secure chat with provider regarding additional fluids for lactic acid of 7.8. Pt in fluid overload and additional fluids would be contraindicated.

## 2021-10-23 NOTE — Sepsis Progress Note (Signed)
Sepsis protocol is being monitored by [facility]. 

## 2021-10-23 NOTE — ED Provider Notes (Signed)
[facility] Provider Note   CSN: [id number] Arrival date & time: 10/23/21  1478     History No chief complaint on file.   Lawrence Rogers is a 60 y.o. male.  Patient has a history of fatty liver and cirrhosis.  She recently had a fall and was unable to get up.  He is very weak and has some mild back discomfort  The history is provided by the patient and a relative. No language interpreter was used.  Fall This is a recurrent problem. The current episode started 6 to 12 hours ago. The problem occurs constantly. The problem has not changed since onset.Associated symptoms include abdominal pain. Pertinent negatives include no chest pain and no headaches. Nothing aggravates the symptoms. Nothing relieves the symptoms. He has tried nothing for the symptoms.      Past Medical History:  Diagnosis Date   Arthritis    Cirrhosis ([place])    Depression    Diabetes [facility])     Patient Active Problem List   Diagnosis Date Noted   Encounter for orthopedic follow-up care 07/18/2021   Full thickness rotator cuff tear 07/05/2021   Chest pain 06/16/2021   Pain in joint of right shoulder 06/01/2021   Pain in joint of right elbow 05/17/2021   Empyema lung ([place]) 07/20/2019   Acute respiratory failure with hypoxia ([place]) 07/20/2019   Empyema ([place]) 07/20/2019   Acute respiratory distress 07/19/2019   Pleural effusion 07/19/2019   Gastric ulcer 09/22/2018   Other cirrhosis of liver ([place]) 08/07/2018   Diabetes ([place])    Cirrhosis of liver with ascites [facility])     Past Surgical History:  Procedure Laterality Date   CATARACT EXTRACTION W/PHACO Left 10/26/2019   Procedure: CATARACT EXTRACTION PHACO AND INTRAOCULAR LENS PLACEMENT LEFT EYE CDE=5.20;  Surgeon: Baruch Goldmann, MD;  Location: [facility];  Service: Ophthalmology;  Laterality: Left;  left   CATARACT EXTRACTION W/PHACO Right 11/09/2019   Procedure: CATARACT EXTRACTION PHACO AND INTRAOCULAR LENS PLACEMENT (IOC);  Surgeon:  Baruch Goldmann, MD;  Location: [facility];  Service: Ophthalmology;  Laterality: Right;  CDE: 3.03   COLONOSCOPY WITH PROPOFOL N/A 12/04/2019   Procedure: COLONOSCOPY WITH PROPOFOL;  Surgeon: Rogene Houston, MD;  [facility];  Service: Endoscopy;  Laterality: N/A;   ESOPHAGEAL BANDING N/A 08/29/2018   Procedure: ESOPHAGEAL BANDING;  Surgeon: Rogene Houston, MD;  [facility];  Service: Endoscopy;  Laterality: N/A;   ESOPHAGEAL BANDING  10/24/2018   Procedure: ESOPHAGEAL BANDING;  Surgeon: Rogene Houston, MD;  [facility];  Service: Endoscopy;;  3 bands   ESOPHAGEAL BANDING  12/04/2019   Procedure: ESOPHAGEAL BANDING;  Surgeon: Rogene Houston, MD;  [facility];  Service: Endoscopy;;   ESOPHAGOGASTRODUODENOSCOPY (EGD) WITH PROPOFOL N/A 08/29/2018   Procedure: ESOPHAGOGASTRODUODENOSCOPY (EGD) WITH PROPOFOL;  Surgeon: Rogene Houston, MD;  [facility];  Service: Endoscopy;  Laterality: N/A;  9:30   ESOPHAGOGASTRODUODENOSCOPY (EGD) WITH PROPOFOL N/A 10/24/2018   Procedure: ESOPHAGOGASTRODUODENOSCOPY (EGD) WITH PROPOFOL;  Surgeon: Rogene Houston, MD;  [facility];  Service: Endoscopy;  Laterality: N/A;  7:30   ESOPHAGOGASTRODUODENOSCOPY (EGD) WITH PROPOFOL N/A 12/04/2019   Procedure: ESOPHAGOGASTRODUODENOSCOPY (EGD) WITH PROPOFOL;  Surgeon: Rogene Houston, MD;  [facility];  Service: Endoscopy;  Laterality: N/A;   IR PARACENTESIS  04/18/2018   IR TRANSCATHETER BX  04/18/2018   IR US GUIDE VASC ACCESS RIGHT  04/18/2018   IR  VENOGRAM HEPATIC W HEMODYNAMIC EVALUATION  04/18/2018   PLEURAL EFFUSION DRAINAGE Right 07/22/2019   Procedure: Drainage Of Pleural Effusion;  Surgeon: Lajuana Matte, MD;  Location: [place];  Service: Thoracic;  Laterality: Right;   POLYPECTOMY  12/04/2019   Procedure: POLYPECTOMY;  Surgeon: Rogene Houston, MD;  [facility];  Service: Endoscopy;;  hepatic flexure, cecal, proximal  transverse colon   SHOULDER ARTHROSCOPY WITH ROTATOR CUFF REPAIR Right 08/04/2021   Procedure: SHOULDER ARTHROSCOPY WITH ROTATOR CUFF REPAIR,EXTENSIVE DEBRIDEMENT, SUBACROMIAL DECOMPRESSION,DISTAL CLAVICLE RESCTION;  Surgeon: Nicholes Stairs, MD;  Location: [facility];  Service: Orthopedics;  Laterality: Right;  160mn   VIDEO ASSISTED THORACOSCOPY (VATS)/DECORTICATION Right 07/22/2019   Procedure: VIDEO ASSISTED THORACOSCOPY (VATS)/DECORTICATION;  Surgeon: LLajuana Matte MD;  [place]  Service: Thoracic;  Laterality: Right;       History reviewed. No pertinent family history.  Social History   Tobacco Use   Smoking status: Former    Packs/day: 0.25    Years: 15.00    Pack years: 3.75    Types: Cigarettes    Quit date: 11/30/1994    Years since quitting: 26.9   Smokeless tobacco: Never  Vaping Use   Vaping Use: Never used  Substance Use Topics   Alcohol use: Never    Comment: Has not drank in 30 yrs.    Drug use: Never    Home Medications Prior to Admission medications   Medication Sig Start Date End Date Taking? Authorizing Provider  atorvastatin (LIPITOR) 20 MG tablet Take 20 mg by mouth daily.   Yes [provider]  bumetanide (BUMEX) 2 MG tablet Take 2 mg by mouth daily. 07/19/21  Yes [provider]  cholecalciferol (VITAMIN D3) 25 MCG (1000 UNIT) tablet Take 1,000 Units by mouth daily.   Yes [provider]  ferrous sulfate 325 (65 FE) MG tablet Take 1 tablet (325 mg total) by mouth daily with breakfast. 10/25/19  Yes Rehman, NMechele Dawley MD  glimepiride (AMARYL) 2 MG tablet Take 2 mg by mouth daily. 10/09/19  Yes [provider]  metFORMIN (GLUCOPHAGE-XR) 500 MG 24 hr tablet Take 2 tablets (1,000 mg total) by mouth 2 (two) times daily. 12/05/19  Yes Rehman, NMechele Dawley MD  methocarbamol (ROBAXIN) 500 MG tablet Take 1 tablet (500 mg total) by mouth every 6 (six) hours as needed for muscle spasms. 08/04/21  Yes RNicholes Stairs MD  ondansetron (ZOFRAN) 4 MG tablet Take 1 tablet (4 mg total) by mouth every 8 (eight) hours as needed for nausea or vomiting. 08/04/21 08/04/22 Yes RNicholes Stairs MD  pantoprazole (PROTONIX) 40 MG tablet Take 1 tablet (40 mg total) by mouth 2 (two) times daily. TAKE ONE TABLET (40MG TOTAL) BY MOUTH DAILY BEFORE BREAKFAST Patient taking differently: Take 40 mg by mouth daily before breakfast. 03/10/20  Yes Rehman, NMechele Dawley MD  spironolactone (ALDACTONE) 100 MG tablet Take 2 tablets (200 mg total) by mouth daily. 07/30/19  Yes AAline August MD  traMADol (ULTRAM) 50 MG tablet Take 50 mg by mouth 3 (three) times daily as needed for moderate pain.   Yes [provider]  oxyCODONE (ROXICODONE) 5 MG immediate release tablet Take 1 tablet (5 mg total) by mouth every 6 (six) hours as needed for severe pain. Patient not taking: No sig reported 08/04/21 08/04/22  RNicholes Stairs MD    Allergies    Patient has no known allergies.  Review of Systems   Review of Systems  Constitutional:  Negative for appetite change and fatigue.  HENT:  Negative for congestion, ear discharge and sinus pressure.   Eyes:  Negative for discharge.  Respiratory:  Negative for cough.   Cardiovascular:  Negative for chest pain.  Gastrointestinal:  Positive for abdominal pain. Negative for diarrhea.  Genitourinary:  Negative for frequency and hematuria.  Musculoskeletal:  Negative for back pain.  Skin:  Negative for rash.  Neurological:  Negative for seizures and headaches.  Psychiatric/Behavioral:  Negative for hallucinations.    Physical Exam Updated Vital Signs BP (!) 118/52   Pulse (!) 111   Temp (!) 97.5 F (36.4 C) (Oral)   Resp 20   SpO2 94%   Physical Exam Vitals and nursing note reviewed.  Constitutional:      Appearance: He is well-developed.  HENT:     Head: Normocephalic.     Right Ear: Ear canal normal.     Mouth/Throat:     Mouth: Mucous membranes are moist.   Eyes:     General: No scleral icterus.    Conjunctiva/sclera: Conjunctivae normal.  Neck:     Thyroid: No thyromegaly.  Cardiovascular:     Rate and Rhythm: Normal rate and regular rhythm.     Heart sounds: No murmur heard.   No friction rub. No gallop.  Pulmonary:     Breath sounds: No stridor. No wheezing or rales.  Chest:     Chest wall: No tenderness.  Abdominal:     General: There is distension.     Tenderness: There is abdominal tenderness. There is no rebound.  Musculoskeletal:        General: Normal range of motion.     Cervical back: Neck supple.  Lymphadenopathy:     Cervical: No cervical adenopathy.  Skin:    Findings: No erythema or rash.  Neurological:     Mental Status: He is alert and oriented to person, place, and time.     Motor: No abnormal muscle tone.     Coordination: Coordination normal.  Psychiatric:        Behavior: Behavior normal.    ED Results / Procedures / Treatments   Labs (all labs ordered are listed, but only abnormal results are displayed) Labs Reviewed  CBC WITH DIFFERENTIAL/PLATELET - Abnormal; Notable for the following components:      Result Value   WBC 137.1 (*)    RBC 4.03 (*)    Hemoglobin 11.6 (*)    HCT 36.2 (*)    RDW 21.1 (*)    Platelets 111 (*)    Neutro Abs 128.9 (*)    Monocytes Absolute 2.7 (*)    All other components within normal limits  COMPREHENSIVE METABOLIC PANEL - Abnormal; Notable for the following components:   Sodium 132 (*)    Chloride 93 (*)    BUN 35 (*)    Creatinine, Ser 1.39 (*)    Total Protein 5.4 (*)    Albumin 2.2 (*)    AST 77 (*)    Alkaline Phosphatase 577 (*)    Total Bilirubin 5.5 (*)    GFR, Estimated 58 (*)    Anion gap 17 (*)    All other components within normal limits  AMMONIA - Abnormal; Notable for the following components:   Ammonia 66 (*)    All other components within normal limits  PROTIME-INR - Abnormal; Notable for the following components:   Prothrombin Time 17.7  (*)    INR 1.5 (*)  All other components within normal limits  CULTURE, BLOOD (SINGLE)  CULTURE, BLOOD (SINGLE)  PATHOLOGIST SMEAR REVIEW  AFP TUMOR MARKER    EKG None  Radiology CT ABDOMEN PELVIS WO CONTRAST  Result Date: 10/23/2021 CLINICAL DATA:  Abdominal distension. Fall. Low back pain. Cirrhosis. Diabetes. EXAM: CT ABDOMEN AND PELVIS WITHOUT CONTRAST TECHNIQUE: Multidetector CT imaging of the abdomen and pelvis was performed following the standard protocol without IV contrast. COMPARISON:  05/20/2020 abdominal ultrasound. 10/07/2018 abdominopelvic CT. FINDINGS: Lower chest: right greater than left dependent lower lobe airspace disease. A 3 mm lingular nodule on 06/05 is not readily apparent on the prior. Small right pleural effusion is new. Normal heart size. Bilateral gynecomastia. Marked periesophageal varices. Hepatobiliary: Subtle hypoattenuating right hepatic lobe mass including at 8.2 x 8.8 cm, suboptimally evaluated on this noncontrast CT. Example 19/3. Likely separate more inferior right hepatic lobe hypoattenuating lesion of 2.2 cm on 30/3. Normal gallbladder, without biliary ductal dilatation. Pancreas: Pancreatic atrophy, without duct dilatation. Spleen: Splenomegaly, including at 18.4 cm craniocaudal. Adrenals/Urinary Tract: Normal adrenal glands. Bilateral renal collecting system calculi, maximally 1.0 cm in the interpolar left kidney. No hydronephrosis. No hydroureter or ureteric calculi. No bladder calculi. Stomach/Bowel: The gastric body is underdistended but appears thick walled on 22/3. The colon is underdistended and poorly evaluated. Normal small bowel caliber. Vascular/Lymphatic: Aortic atherosclerosis. Extensive perigastric varices. Small abdominal retroperitoneal nodes are likely reactive in the setting of cirrhosis. Index precaval 10 mm node on 38/3 is similar. No pelvic sidewall adenopathy. Reproductive: Normal prostate. Other: Moderate volume abdominopelvic ascites.  No free intraperitoneal air. Musculoskeletal: No acute osseous abnormality. IMPRESSION: 1. No posttraumatic deformity identified. 2. Cirrhosis and portal venous hypertension with moderate volume ascites. Hypoattenuating liver lesions, suspicious for multifocal hepatocellular carcinoma. When the patient is clinically stable and able to follow directions and hold their breath (preferably as an outpatient) further evaluation with dedicated abdominal MRI should be considered. 3. Right pleural effusion with bibasilar airspace disease. Most likely atelectasis. At the right lung base, infection cannot be excluded. 4. Bilateral nephrolithiasis. 5. Gastric wall thickening in the setting of underdistention. Recommend clinical exclusion of gastritis. Hypoalbuminemia could create a similar appearance. 6. 3 mm lingular nodule. No follow-up needed if patient is low-risk. Non-contrast chest CT can be considered in 12 months if patient is high-risk. This recommendation follows the consensus statement: Guidelines for Management of Incidental Pulmonary Nodules Detected on CT Images: From the Fleischner Society 2017; Radiology 2017; 284:228-243. 7. Bilateral gynecomastia. These results were called by telephone at the time of interpretation on 10/23/2021 at 12:37 pm to provider [facility] Alder Murri , who verbally acknowledged these results. Electronically Signed   By: Abigail Miyamoto M.D.   On: 10/23/2021 12:38   DG Chest Port 1 View  Result Date: 10/23/2021 CLINICAL DATA:  Weakness EXAM: PORTABLE CHEST 1 VIEW COMPARISON:  Chest x-ray 09/22/2021 FINDINGS: Heart size is upper normal. Mediastinum appears stable. Mild calcified plaques in the aortic arch. Mild pulmonary vascular/interstitial prominence. Small left and moderate right pleural effusion with associated atelectasis/infiltrate. No pneumothorax. IMPRESSION: Bilateral pleural effusions right greater than left. Mild pulmonary vascular prominence. Electronically Signed   By: Ofilia Neas M.D.   On: 10/23/2021 11:56    Procedures Procedures   Medications Ordered in ED Medications  metroNIDAZOLE (FLAGYL) IVPB 500 mg (500 mg Intravenous New Bag/Given 10/23/21 1252)  sodium chloride 0.9 % bolus 250 mL (0 mLs Intravenous Stopped 10/23/21 1145)  sodium chloride 0.9 % bolus 500 mL (500 mLs Intravenous New Bag/Given  10/23/21 1248)  ceFEPIme (MAXIPIME) 2 g in sodium chloride 0.9 % 100 mL IVPB (2 g Intravenous New Bag/Given 10/23/21 1251)    ED Course  I have reviewed the triage vital signs and the nursing notes.  Pertinent labs & imaging results that were available during my care of the patient were reviewed by me and considered in my medical decision making (see chart for details). CRITICAL CARE Performed by: Milton Ferguson Total critical care time: 40 minutes Critical care time was exclusive of separately billable procedures and treating other patients. Critical care was necessary to treat or prevent imminent or life-threatening deterioration. Critical care was time spent personally by me on the following activities: development of treatment plan with patient and/or surrogate as well as nursing, discussions with consultants, evaluation of patient's response to treatment, examination of patient, obtaining history from patient or surrogate, ordering and performing treatments and interventions, ordering and review of laboratory studies, ordering and review of radiographic studies, pulse oximetry and re-evaluation of patient's condition.    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  He also may be septic and is started on antibiotics.  His white count is over 130,000.  He will be seen by GI and hematology Final Clinical Impression(s) / ED Diagnoses Final diagnoses:  Fall, initial encounter    Rx / DC Orders ED Discharge Orders     None        Milton Ferguson, MD 10/24/21 1641

## 2021-10-24 ENCOUNTER — Inpatient Hospital Stay (HOSPITAL_COMMUNITY): Payer: Managed Care, Other (non HMO)

## 2021-10-24 DIAGNOSIS — K652 Spontaneous bacterial peritonitis: Secondary | ICD-10-CM

## 2021-10-24 DIAGNOSIS — R188 Other ascites: Secondary | ICD-10-CM

## 2021-10-24 DIAGNOSIS — D72823 Leukemoid reaction: Secondary | ICD-10-CM

## 2021-10-24 DIAGNOSIS — R609 Edema, unspecified: Secondary | ICD-10-CM

## 2021-10-24 DIAGNOSIS — K7469 Other cirrhosis of liver: Secondary | ICD-10-CM | POA: Diagnosis not present

## 2021-10-24 DIAGNOSIS — R600 Localized edema: Secondary | ICD-10-CM

## 2021-10-24 DIAGNOSIS — E7220 Disorder of urea cycle metabolism, unspecified: Secondary | ICD-10-CM

## 2021-10-24 DIAGNOSIS — K769 Liver disease, unspecified: Secondary | ICD-10-CM

## 2021-10-24 DIAGNOSIS — R160 Hepatomegaly, not elsewhere classified: Secondary | ICD-10-CM | POA: Diagnosis not present

## 2021-10-24 DIAGNOSIS — E43 Unspecified severe protein-calorie malnutrition: Secondary | ICD-10-CM | POA: Insufficient documentation

## 2021-10-24 LAB — GLUCOSE, CAPILLARY
Glucose-Capillary: 73 mg/dL (ref 70–99)
Glucose-Capillary: 106 mg/dL — ABNORMAL HIGH (ref 70–99)

## 2021-10-24 LAB — CBC
HCT: 28.9 % — ABNORMAL LOW (ref 39.0–52.0)
Hemoglobin: 9.4 g/dL — ABNORMAL LOW (ref 13.0–17.0)
MCH: 29.2 pg (ref 26.0–34.0)
MCHC: 32.5 g/dL (ref 30.0–36.0)
MCV: 89.8 fL (ref 80.0–100.0)
Platelets: 76 10*3/uL — ABNORMAL LOW (ref 150–400)
RBC: 3.22 MIL/uL — ABNORMAL LOW (ref 4.22–5.81)
RDW: 20.8 % — ABNORMAL HIGH (ref 11.5–15.5)
WBC: 100.3 10*3/uL (ref 4.0–10.5)
nRBC: 0 % (ref 0.0–0.2)

## 2021-10-24 LAB — COMPREHENSIVE METABOLIC PANEL
ALT: 29 U/L (ref 0–44)
AST: 65 U/L — ABNORMAL HIGH (ref 15–41)
Albumin: 2.3 g/dL — ABNORMAL LOW (ref 3.5–5.0)
Alkaline Phosphatase: 386 U/L — ABNORMAL HIGH (ref 38–126)
Anion gap: 7 (ref 5–15)
BUN: 37 mg/dL — ABNORMAL HIGH (ref 6–20)
CO2: 26 mmol/L (ref 22–32)
Calcium: 8.6 mg/dL — ABNORMAL LOW (ref 8.9–10.3)
Chloride: 96 mmol/L — ABNORMAL LOW (ref 98–111)
Creatinine, Ser: 1.18 mg/dL (ref 0.61–1.24)
GFR, Estimated: >60 mL/min (ref >60)
Glucose, Bld: 47 mg/dL — ABNORMAL LOW (ref 70–99)
Potassium: 3.9 mmol/L (ref 3.5–5.1)
Sodium: 129 mmol/L — ABNORMAL LOW (ref 135–145)
Total Bilirubin: 5.4 mg/dL — ABNORMAL HIGH (ref 0.3–1.2)
Total Protein: 4.7 g/dL — ABNORMAL LOW (ref 6.5–8.1)

## 2021-10-24 LAB — BLOOD CULTURE ID PANEL (REFLEXED) - BCID2

## 2021-10-24 LAB — ACID FAST SMEAR (AFB, MYCOBACTERIA): Acid Fast Smear: NEGATIVE

## 2021-10-24 LAB — BILIRUBIN, FRACTIONATED(TOT/DIR/INDIR)
Bilirubin, Direct: 2.3 mg/dL — ABNORMAL HIGH (ref 0.0–0.2)
Indirect Bilirubin: 2.8 mg/dL — ABNORMAL HIGH (ref 0.3–0.9)
Total Bilirubin: 5.1 mg/dL — ABNORMAL HIGH (ref 0.3–1.2)

## 2021-10-24 LAB — SODIUM, URINE, RANDOM: Sodium, Ur: 29 mmol/L

## 2021-10-24 LAB — PROTIME-INR
INR: 1.6 — ABNORMAL HIGH (ref 0.8–1.2)
Prothrombin Time: 19.4 seconds — ABNORMAL HIGH (ref 11.4–15.2)

## 2021-10-24 LAB — OSMOLALITY, URINE: Osmolality, Ur: 532 mOsm/kg (ref 300–900)

## 2021-10-24 LAB — OSMOLALITY: Osmolality: 283 mOsm/kg (ref 275–295)

## 2021-10-24 LAB — LACTIC ACID, PLASMA: Lactic Acid, Venous: 2.4 mmol/L (ref 0.5–1.9)

## 2021-10-24 LAB — TSH: TSH: 2.986 u[IU]/mL (ref 0.350–4.500)

## 2021-10-24 LAB — MAGNESIUM: Magnesium: 2.2 mg/dL (ref 1.7–2.4)

## 2021-10-24 LAB — PROCALCITONIN: Procalcitonin: 4.23 ng/mL

## 2021-10-24 LAB — AFP TUMOR MARKER: AFP, Serum, Tumor Marker: 64.3 ng/mL — ABNORMAL HIGH (ref 0.0–8.4)

## 2021-10-24 LAB — AMMONIA: Ammonia: 49 umol/L — ABNORMAL HIGH (ref 9–35)

## 2021-10-24 MED ORDER — LACTULOSE 10 GM/15ML PO SOLN
10.0000 g | Freq: Every day | ORAL | Status: DC
  Administered 2021-10-24 – 2021-10-27 (×4): 10 g via ORAL
  Filled 2021-10-24 (×4): qty 30

## 2021-10-24 MED ORDER — SPIRONOLACTONE 25 MG PO TABS
50.0000 mg | ORAL_TABLET | Freq: Every day | ORAL | Status: DC
  Administered 2021-10-24 – 2021-10-31 (×8): 50 mg via ORAL
  Filled 2021-10-24 (×8): qty 2

## 2021-10-24 MED ORDER — SODIUM CHLORIDE 0.9 % IV SOLN
INTRAVENOUS | Status: DC | PRN
  Administered 2021-10-24: 10 mL via INTRAVENOUS

## 2021-10-24 MED ORDER — GADOBUTROL 1 MMOL/ML IV SOLN
9.0000 mL | Freq: Once | INTRAVENOUS | Status: AC | PRN
  Administered 2021-10-24: 9 mL via INTRAVENOUS

## 2021-10-24 NOTE — Plan of Care (Signed)
  Problem: Acute Rehab PT Goals(only PT should resolve) Goal: Patient Will Transfer Sit To/From Stand Outcome: Progressing [place]sheets (Taken 10/24/2021 1307) Patient will transfer sit to/from stand: with modified independence Goal: Pt Will Transfer Bed To Chair/Chair To Bed Outcome: Progressing Flowsheets (Taken 10/24/2021 1307) Pt will Transfer Bed to Chair/Chair to Bed: with modified independence Goal: Pt Will Ambulate Outcome: Progressing Flowsheets (Taken 10/24/2021 1307) Pt will Ambulate:  > 125 feet  with supervision  with least restrictive assistive device Goal: Pt/caregiver will Perform Home Exercise Program Outcome: Progressing [place] (Taken 10/24/2021 1307) Pt/caregiver will Perform Home Exercise Program:  For increased ROM  For increased strengthening  For improved balance  With Supervision, verbal cues required/provided   Talbot Grumbling PT, DPT 10/24/21, 1:08 PM

## 2021-10-24 NOTE — Progress Notes (Signed)
Critical WBC of 100.3 down from 137. Dr. Manuella Ghazi notified.

## 2021-10-24 NOTE — Progress Notes (Addendum)
Subjective: Doing ok this morning. Just got back from [place]. No confusion. No abdominal pain. Feels his abdomen is getting back to normal, but still somewhat distended. Peripheral edema continues without change. No brbpr or melena. No nausea or vomiting. No recent change in bowel habits. Typically with 1-2 Bms daily. No BM since yesterday. Tolerated breakfast well this morning.   Objective: Vital signs in last 24 hours: Temp:  [97.5 F (36.4 C)-98.4 F (36.9 C)] 98.4 F (36.9 C) (11/08 0456) Pulse Rate:  [82-114] 91 (11/08 0456) Resp:  [17-22] 20 (11/08 0456) BP: (105-132)/(43-66) 117/62 (11/08 0456) SpO2:  [92 %-100 %] 94 % (11/08 0456) Weight:  [95.2 kg-101 kg] 95.2 kg (11/08 0500) Last BM Date: 10/23/21 General:   Alert and oriented, pleasant, chronically ill appearing.  Head:  Normocephalic and atraumatic. Eyes: Mild scleral icterus. Abdomen: Protuberant abdomen, but soft and nontense without tenderness to palpation.  Small umbilical hernia, easily reducible.  Bowel sounds present.   Msk:  Symmetrical without gross deformities. Normal posture. Extremities:  With 3+ pitting edema up to knees bilaterally. Neurologic:  Alert and  oriented x4;  grossly normal neurologically.  No asterixis. Psych: Normal mood and affect.  Intake/Output from previous day: 11/07 0701 - 11/08 0700 In: 1447.6 [P.O.:240; I.V.:61.9; IV Piggyback:1145.7] Out: -  Intake/Output this shift: No intake/output data recorded.  Lab Results: Recent Labs    10/23/21 1044 10/24/21 0517  WBC 137.1* 100.3*  HGB 11.6* 9.4*  HCT 36.2* 28.9*  PLT 111* 76*   BMET Recent Labs    10/23/21 1044 10/24/21 0517  NA 132* 129*  K 4.1 3.9  CL 93* 96*  CO2 22 26  GLUCOSE 85 47*  BUN 35* 37*  CREATININE 1.39* 1.18  CALCIUM 9.5 8.6*   LFT Recent Labs    10/23/21 1044 10/24/21 0517  PROT 5.4* 4.7*  ALBUMIN 2.2* 2.3*  AST 77* 65*  ALT 37 29  ALKPHOS 577* 386*  BILITOT 5.5* 5.1*  5.4*  BILIDIR  --  2.3*   IBILI  --  2.8*   PT/INR Recent Labs    10/23/21 1044 10/24/21 0517  LABPROT 17.7* 19.4*  INR 1.5* 1.6*    Studies/Results: CT ABDOMEN PELVIS WO CONTRAST  Result Date: 10/23/2021 CLINICAL DATA:  Abdominal distension. Fall. Low back pain. Cirrhosis. Diabetes. EXAM: CT ABDOMEN AND PELVIS WITHOUT CONTRAST TECHNIQUE: Multidetector CT imaging of the abdomen and pelvis was performed following the standard protocol without IV contrast. COMPARISON:  05/20/2020 abdominal ultrasound. 10/07/2018 abdominopelvic CT. FINDINGS: Lower chest: right greater than left dependent lower lobe airspace disease. A 3 mm lingular nodule on 06/05 is not readily apparent on the prior. Small right pleural effusion is new. Normal heart size. Bilateral gynecomastia. Marked periesophageal varices. Hepatobiliary: Subtle hypoattenuating right hepatic lobe mass including at 8.2 x 8.8 cm, suboptimally evaluated on this noncontrast CT. Example 19/3. Likely separate more inferior right hepatic lobe hypoattenuating lesion of 2.2 cm on 30/3. Normal gallbladder, without biliary ductal dilatation. Pancreas: Pancreatic atrophy, without duct dilatation. Spleen: Splenomegaly, including at 18.4 cm craniocaudal. Adrenals/Urinary Tract: Normal adrenal glands. Bilateral renal collecting system calculi, maximally 1.0 cm in the interpolar left kidney. No hydronephrosis. No hydroureter or ureteric calculi. No bladder calculi. Stomach/Bowel: The gastric body is underdistended but appears thick walled on 22/3. The colon is underdistended and poorly evaluated. Normal small bowel caliber. Vascular/Lymphatic: Aortic atherosclerosis. Extensive perigastric varices. Small abdominal retroperitoneal nodes are likely reactive in the setting of cirrhosis. Index precaval 10 mm node  on 38/3 is similar. No pelvic sidewall adenopathy. Reproductive: Normal prostate. Other: Moderate volume abdominopelvic ascites. No free intraperitoneal air. Musculoskeletal: No acute  osseous abnormality. IMPRESSION: 1. No posttraumatic deformity identified. 2. Cirrhosis and portal venous hypertension with moderate volume ascites. Hypoattenuating liver lesions, suspicious for multifocal hepatocellular carcinoma. When the patient is clinically stable and able to follow directions and hold their breath (preferably as an outpatient) further evaluation with dedicated abdominal MRI should be considered. 3. Right pleural effusion with bibasilar airspace disease. Most likely atelectasis. At the right lung base, infection cannot be excluded. 4. Bilateral nephrolithiasis. 5. Gastric wall thickening in the setting of underdistention. Recommend clinical exclusion of gastritis. Hypoalbuminemia could create a similar appearance. 6. 3 mm lingular nodule. No follow-up needed if patient is low-risk. Non-contrast chest CT can be considered in 12 months if patient is high-risk. This recommendation follows the consensus statement: Guidelines for Management of Incidental Pulmonary Nodules Detected on CT Images: From the Fleischner Society 2017; Radiology 2017; 284:228-243. 7. Bilateral gynecomastia. These results were called by telephone at the time of interpretation on 10/23/2021 at 12:37 pm to provider [facility] ZAMMIT , who verbally acknowledged these results. Electronically Signed   By: Abigail Miyamoto M.D.   On: 10/23/2021 12:38   US Paracentesis  Result Date: 10/23/2021 INDICATION: Cirrhosis, ascites EXAM: ULTRASOUND GUIDED DIAGNOSTIC AND THERAPEUTIC PARACENTESIS MEDICATIONS: None. COMPLICATIONS: None immediate. PROCEDURE: Informed written consent was obtained from the patient after a discussion of the risks, benefits and alternatives to treatment. A timeout was performed prior to the initiation of the procedure. Initial ultrasound scanning demonstrates a large amount of ascites within the LEFT lower abdominal quadrant. The right lower abdomen was prepped and draped in the usual sterile fashion. 1% lidocaine was  used for local anesthesia. Following this, a 5 [place] Yueh catheter was introduced. An ultrasound image was saved for documentation purposes. The paracentesis was performed. The catheter was removed and a dressing was applied. The patient tolerated the procedure well without immediate post procedural complication. FINDINGS: A total of approximately 3.6 L of yellow ascitic fluid was removed. Samples were sent to the laboratory as requested by the clinical team. IMPRESSION: Successful ultrasound-guided paracentesis yielding 3.6 liters of peritoneal fluid. Electronically Signed   By: Lavonia Dana M.D.   On: 10/23/2021 15:00   DG Chest Port 1 View  Result Date: 10/23/2021 CLINICAL DATA:  Weakness EXAM: PORTABLE CHEST 1 VIEW COMPARISON:  Chest x-ray 09/22/2021 FINDINGS: Heart size is upper normal. Mediastinum appears stable. Mild calcified plaques in the aortic arch. Mild pulmonary vascular/interstitial prominence. Small left and moderate right pleural effusion with associated atelectasis/infiltrate. No pneumothorax. IMPRESSION: Bilateral pleural effusions right greater than left. Mild pulmonary vascular prominence. Electronically Signed   By: Ofilia Neas M.D.   On: 10/23/2021 11:56    Assessment: Lawrence Rogers is a 60 y.o. 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 Presenting now after a fall at home, weakness/fatigue with acutely decompensated disease with ascites, anasarca, worsening renal function, profound leukocytosis with WBC count 137, lactic acidosis, and CT findings concerning for [place]. Oncology has been consulted due to WBC count and recommending antibiotics.   Acutely decompensated NASH Cirrhosis with SBP:  MELD Na 24 on admission, 25 today.  Ammonia slightly elevated this admission without signs of encephalopathy. Notable ascites and lower extremity edema on exam in  setting of hypoalbuminemia  and noncompliance with diuretics outpatient  (Bumex 3 mg and spironolactone 200 mg daily) due to not feeling well. Paracentesis yesterday yielding 3.6 L, fluid analysis consistent with SBP (355 PMNs), gram stain with gram-positive cocci in clusters. Currently on broad-spectrum antibiotics and started on albumin.  On exam today, abdomen is protuberant/distended, but remains soft and non tender, continues with 3+ bilateral pitting edema up to knees.  Diuretics have  been on hold this admission due to AKI on admission, lactic acidosis, and now with SBP.  Encouragingly, creatinine improved to 1.18 today from 1.39 yesterday, lactate improving.  Discussed with Dr. Jenetta Downer, recommended starting Aldactone 50 mg daily for now and monitoring Cr and electrolytes closely. He had a doppler ultrasound liver this morning to evaluate for patency of portal vein, final report pending. Of note, he is over due for variceal surveillance.  Last EGD December 2020 with grade 3 varices s/p banding x5.  Liver lesions: Subtle hypoattenuating right hepatic lobe mass 8.2 x 8.8 cm and separate more inferior right hepatic lobe hypoattenuating lesion 2.2 cm.  Lesions are suboptimally evaluated on noncontrast CT, findings concerning for multifocal hepatocellular carcinoma.  AFP this admission is elevated at 64.3.  He needs MRI or CT with liver protocol for further evaluation outpatient as well as referral to oncology.   Elevated LFTs and bilirubin:  Likely multifactorial in the setting of acute illness and new liver lesions/acute decompensated cirrhosis.  Stable/slightly improved today.  AST 77>> 65, alk phos 577>> 386, total bilirubin 5.5>> 5.4. Needs dedicated imaging of his liver outpatient. Will fractionate bilirubin.   Leukocytosis:  Likely multifactorial in the setting of SBP, bacteremia (preliminary gram-positive cocci in clusters), and concern for malignancy.  Currently on broad-spectrum empiric  antibiotics with some improvement.  WBC 137.1>>100.3.   Gastric wall thickening on CT: Suspected to be underdistension.  Can be evaluated at the time of EGD for variceal surveillance, likely outpatient.  Anemia:  Decline in hemoglobin to 9.4 today from 11.6 yesterday.  No overt GI bleeding.  Likely influenced by hemodilution in the setting of IV fluids and albumin.  Continue to monitor.   Plan: Fractionate bilirubin. Follow-up on Doppler ultrasound liver.  Continue empiric antibiotics.  Tailor pending culture results. He will need a total of 5 days for SBP. He will need to transition to antibiotics for prophylaxis thereafter.  Continue albumin for SBP.  Currently completing 1.5 g/kg dosing, started yesterday evening.  This need to be followed by 1 g/kg on day 3. Start Aldactone 50 mg daily per Dr. Jenetta Downer with close monitoring of electrolytes and kidney function.  2 g sodium diet.  Start Lactulose 15 mL daily to help with bowel regularity and mildly elevated ammonia. Goal of 3 soft BMs daily.   Monitor HFP, BMP, INR, CBC daily. Monitor for overt GI bleeding. Ordered SCDs Needs MRI or CT with liver protocol outpatient. Needs EGD for esophageal variceal screening, likely outpatient.   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  Also with questionable thrombus in the IVC.  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  Recommended MRI for further evaluation.  Patient was notified of results and plan for MRI liver with and without contrast.  Aliene Altes, PA-C [facility]

## 2021-10-24 NOTE — Consult Note (Signed)
[facility] Consultation Oncology  Name: Lawrence Rogers      MRN: [id number]    Location: R740/C144-81  Date: 10/24/2021 Time:4:29 PM   REFERRING PHYSICIAN: Dr. Manuella Ghazi  REASON FOR CONSULT: Hypodense hepatic lesions and leukocytosis.   DIAGNOSIS: Leukemoid reaction and highly likely [place].  HISTORY OF PRESENT ILLNESS: Lawrence Rogers is a 60 year old male seen in consultation today at the request of Dr. Manuella Ghazi for further work-up and management of elevated white count in the setting of SBP.  He also has history of cirrhosis felt secondary to [facility] followed by [facility] transplant but was not a transplant candidate as of August 2022 due to low MELD.  He presented to the ER with fever of 102.1.  CT scan of the abdomen and pelvis showed cirrhosis with portal hypertension, hypodense liver masses, right pleural effusion.  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  He lives at home with his daughter who works as a Marine scientist at [employer].  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  He quit smoking long time ago.  Denies any history of hepatitis B or C infections.  PAST MEDICAL HISTORY:   Past Medical History:  Diagnosis Date   Arthritis    Cirrhosis ([place])    Depression    Diabetes ([place])     ALLERGIES: No Known Allergies    MEDICATIONS: I have reviewed the patient's current medications.     PAST SURGICAL HISTORY Past Surgical History:  Procedure Laterality Date   CATARACT EXTRACTION W/PHACO Left 10/26/2019   Procedure: CATARACT EXTRACTION PHACO AND INTRAOCULAR LENS PLACEMENT LEFT EYE CDE=5.20;  Surgeon: Baruch Goldmann, MD;  Location: [facility] ORS;  Service: Ophthalmology;  Laterality: Left;  left   CATARACT EXTRACTION W/PHACO Right 11/09/2019   Procedure: CATARACT EXTRACTION PHACO AND INTRAOCULAR LENS PLACEMENT (IOC);  Surgeon: Baruch Goldmann, MD;  Location: [facility] OR[facility];  Service:  Ophthalmology;  Laterality: Right;  CDE: 3.03   COLONOSCOPY WITH PROPOFOL N/A 12/04/2019   Procedure: COLONOSCOPY WITH PROPOFOL;  Surgeon: Rogene Houston, MD;  Location: [facility] ENDO SUITE;  Service: Endoscopy;  Laterality: N/A;   ESOPHAGEAL BANDING N/A 08/29/2018   Procedure: ESOPHAGEAL BANDING;  Surgeon: Rogene Houston, MD;  Location: [facility] SU[facility];  Service: Endoscopy;  Laterality: N/A;   ESOPHAGEAL BANDING  10/24/2018   Procedure: ESOPHAGEAL BANDING;  Surgeon: Rogene Houston, MD;  Location: [facility];  Service: Endoscopy;;  3 bands   ESOPHAGEAL BANDING  12/04/2019   Procedure: ESOPHAGEAL BANDING;  Surgeon: Rogene Houston, MD;  Location: [facility] SU[facility];  Service: Endoscopy;;   ESOPHAGOGASTRODUODENOSCOPY (EGD) WITH PROPOFOL N/A 08/29/2018   Procedure: ESOPHAGOGASTRODUODENOSCOPY (EGD) WITH PROPOFOL;  Surgeon: Rogene Houston, MD;  Location: [facility];  Service: Endoscopy;  Laterality: N/A;  9:30   ESOPHAGOGASTRODUODENOSCOPY (EGD) WITH PROPOFOL N/A 10/24/2018   Procedure: ESOPHAGOGASTRODUODENOSCOPY (EGD) WITH PROPOFOL;  Surgeon: Rogene Houston, MD;  Location: [facility];  Service: Endoscopy;  Laterality: N/A;  7:30   ESOPHAGOGASTRODUODENOSCOPY (EGD) WITH PROPOFOL N/A 12/04/2019   Procedure: ESOPHAGOGASTRODUODENOSCOPY (EGD) WITH PROPOFOL;  Surgeon: Rogene Houston, MD;  Location: [facility];  Service: Endoscopy;  Laterality: N/A;   IR PARACENTESIS  04/18/2018   IR TRANSCATHETER BX  04/18/2018   IR US GUIDE VASC ACCESS RIGHT  04/18/2018   IR VENOGRAM HEPATIC W HEMODYNAMIC EVALUATION  04/18/2018   PLEURAL EFFUSION DRAINAGE Right 07/22/2019  Procedure: Drainage Of Pleural Effusion;  Surgeon: Lajuana Matte, MD;  Location: [place];  Service: Thoracic;  Laterality: Right;   POLYPECTOMY  12/04/2019   Procedure: POLYPECTOMY;  Surgeon: Rogene Houston, MD;  Location: [facility];  Service: Endoscopy;;  hepatic flexure, cecal, proximal transverse colon   SHOULDER ARTHROSCOPY WITH ROTATOR  CUFF REPAIR Right 08/04/2021   Procedure: SHOULDER ARTHROSCOPY WITH ROTATOR CUFF REPAIR,EXTENSIVE DEBRIDEMENT, SUBACROMIAL DECOMPRESSION,DISTAL CLAVICLE RESCTION;  Surgeon: Nicholes Stairs, MD;  Location: [facility];  Service: Orthopedics;  Laterality: Right;  152mn   VIDEO ASSISTED THORACOSCOPY (VATS)/DECORTICATION Right 07/22/2019   Procedure: VIDEO ASSISTED THORACOSCOPY (VATS)/DECORTICATION;  Surgeon: LLajuana Matte MD;  [facility]: [facility]  Service: Thoracic;  Laterality: Right;    FAMILY HISTORY: History reviewed. No pertinent family history.  SOCIAL HISTORY:  reports that he quit smoking about 26 years ago. His smoking use included cigarettes. He has a 3.75 pack-year smoking history. He has never used smokeless tobacco. He reports that he does not drink alcohol and does not use drugs.  PERFORMANCE STATUS: The patient's performance status is 2 - Symptomatic, <50% confined to bed  PHYSICAL EXAM: Most Recent Vital Signs: Blood pressure 95/78, pulse 99, temperature 98.2 F (36.8 C), resp. rate 18, weight 209 lb 14.1 oz (95.2 kg), SpO2 98 %. BP 95/78 (BP Location: Left Arm)   Pulse 99   Temp 98.2 F (36.8 C)   Resp 18   Wt 209 lb 14.1 oz (95.2 kg)   SpO2 98%   BMI 26.95 kg/m  General appearance: alert, cooperative, and appears stated age Lungs:  Bilateral air entry with decreased breath sounds at bases. Heart: regular rate and rhythm Abdomen:  Distended with ascites.  No clear palpable masses. Extremities:  3+ edema bilaterally. Neurologic: Grossly normal  LABORATORY DATA:  Results for orders placed or performed during the hospital encounter of 10/23/21 (from the past 48 hour(s))  CBC with Differential/Platelet     Status: Abnormal   Collection Time: 10/23/21 10:44 AM  Result Value Ref Range   WBC 137.1 (HH) 4.0 - 10.5 K/uL    Comment: This critical result has verified and been called to [facility] TJoaquin Courtson 11 07 2022 at 1137, and has been read  back.    RBC 4.03 (L) 4.22 - 5.81 MIL/uL   Hemoglobin 11.6 (L) 13.0 - 17.0 g/dL   HCT 36.2 (L) 39.0 - 52.0 %   MCV 89.8 80.0 - 100.0 fL   MCH 28.8 26.0 - 34.0 pg   MCHC 32.0 30.0 - 36.0 g/dL   RDW 21.1 (H) 11.5 - 15.5 %   Platelets 111 (L) 150 - 400 K/uL    Comment: SPECIMEN CHECKED FOR CLOTS Immature Platelet Fraction may be clinically indicated, consider ordering this additional test [id number] COUNT CONFIRMED BY SMEAR    nRBC 0.0 0.0 - 0.2 %   Neutrophils Relative % 91 %   Neutro Abs 128.9 (H) 1.7 - 7.7 K/uL   Band Neutrophils 3 %   Lymphocytes Relative 2 %   Lymphs Abs 2.7 0.7 - 4.0 K/uL   Monocytes Relative 2 %   Monocytes Absolute 2.7 (H) 0.1 - 1.0 K/uL   Eosinophils Relative 0 %   Eosinophils Absolute 0.0 0.0 - 0.5 K/uL   Basophils Relative 0 %   Basophils Absolute 0.0 0.0 - 0.1 K/uL   WBC Morphology TOXIC GRANULATION    RBC Morphology MORPHOLOGY UNREMARKABLE    Metamyelocytes Relative 2 %  Comment: Performed at [facility], [address] [place], [place] [address]  Comprehensive metabolic panel     Status: Abnormal   Collection Time: 10/23/21 10:44 AM  Result Value Ref Range   Sodium 132 (L) 135 - 145 mmol/L   Potassium 4.1 3.5 - 5.1 mmol/L   Chloride 93 (L) 98 - 111 mmol/L   CO2 22 22 - 32 mmol/L   Glucose, Bld 85 70 - 99 mg/dL    Comment: Glucose reference range applies only to samples taken after fasting for at least 8 hours.   BUN 35 (H) 6 - 20 mg/dL   Creatinine, Ser 1.39 (H) 0.61 - 1.24 mg/dL   Calcium 9.5 8.9 - 10.3 mg/dL   Total Protein 5.4 (L) 6.5 - 8.1 g/dL   Albumin 2.2 (L) 3.5 - 5.0 g/dL   AST 77 (H) 15 - 41 U/L   ALT 37 0 - 44 U/L   Alkaline Phosphatase 577 (H) 38 - 126 U/L   Total Bilirubin 5.5 (H) 0.3 - 1.2 mg/dL   GFR, Estimated 58 (L) >60 mL/min    Comment: (NOTE) Calculated using the CKD-EPI Creatinine Equation (2021)    Anion gap 17 (H) 5 - 15    Comment: Performed at [facility], [address], [place], [place] [address]   Protime-INR     Status: Abnormal   Collection Time: 10/23/21 10:44 AM  Result Value Ref Range   Prothrombin Time 17.7 (H) 11.4 - 15.2 seconds   INR 1.5 (H) 0.8 - 1.2    Comment: (NOTE) INR goal varies based on device and disease states. Performed at [facility], [address], [place], [place] [address]   Ammonia     Status: Abnormal   Collection Time: 10/23/21 10:45 AM  Result Value Ref Range   Ammonia 66 (H) 9 - 35 umol/L    Comment: Performed at [facility], [address], [place], [place] [address]  Culture, blood (single)     Status: None (Preliminary result)   Collection Time: 10/23/21 12:52 PM   Specimen: Right Antecubital; Blood  Result Value Ref Range   Specimen Description      RIGHT ANTECUBITAL Performed at [facility], [address], [place], [place] [address]    Special Requests      BOTTLES DRAWN AEROBIC AND ANAEROBIC Blood Culture adequate volume Performed at [facility], [address], [place], [place] [address]    Culture  Setup Time      GRAM POSITIVE COCCI IN CLUSTERS AEB AND ANA BOTTLES Gram Stain Report Called to,Read Back By and Verified With: Coralie Common RN [phone] K FORSYTH   Culture      CULTURE REINCUBATED FOR BETTER GROWTH Performed at [facility], [place] [address], [place],  [address]    Report Status PENDING   Blood Culture ID Panel (Reflexed)     Status: None   Collection Time: 10/23/21 12:52 PM  Result Value Ref Range   Enterococcus faecalis NOT DETECTED NOT DETECTED   Enterococcus Faecium NOT DETECTED NOT DETECTED   Listeria monocytogenes NOT DETECTED NOT DETECTED   Staphylococcus species NOT DETECTED NOT DETECTED   Staphylococcus aureus (BCID) NOT DETECTED NOT DETECTED   Staphylococcus epidermidis NOT DETECTED NOT DETECTED   Staphylococcus lugdunensis NOT DETECTED NOT DETECTED   Streptococcus species NOT DETECTED NOT DETECTED   Streptococcus agalactiae NOT DETECTED NOT DETECTED   Streptococcus pneumoniae NOT DETECTED NOT  DETECTED   Streptococcus pyogenes NOT DETECTED NOT DETECTED  A.calcoaceticus-baumannii NOT DETECTED NOT DETECTED   Bacteroides fragilis NOT DETECTED NOT DETECTED   Enterobacterales NOT DETECTED NOT DETECTED   Enterobacter cloacae complex NOT DETECTED NOT DETECTED   Escherichia coli NOT DETECTED NOT DETECTED   Klebsiella aerogenes NOT DETECTED NOT DETECTED   Klebsiella oxytoca NOT DETECTED NOT DETECTED   Klebsiella pneumoniae NOT DETECTED NOT DETECTED   Proteus species NOT DETECTED NOT DETECTED   Salmonella species NOT DETECTED NOT DETECTED   Serratia marcescens NOT DETECTED NOT DETECTED   Haemophilus influenzae NOT DETECTED NOT DETECTED   Neisseria meningitidis NOT DETECTED NOT DETECTED   Pseudomonas aeruginosa NOT DETECTED NOT DETECTED   Stenotrophomonas maltophilia NOT DETECTED NOT DETECTED   Candida albicans NOT DETECTED NOT DETECTED   Candida auris NOT DETECTED NOT DETECTED   Candida glabrata NOT DETECTED NOT DETECTED   Candida krusei NOT DETECTED NOT DETECTED   Candida parapsilosis NOT DETECTED NOT DETECTED   Candida tropicalis NOT DETECTED NOT DETECTED   Cryptococcus neoformans/gattii NOT DETECTED NOT DETECTED    Comment: Performed at [facility], [place] [address], [place], [place] [address]  AFP tumor marker     Status: Abnormal   Collection Time: 10/23/21 12:53 PM  Result Value Ref Range   AFP, Serum, Tumor Marker 64.3 (H) 0.0 - 8.4 ng/mL    Comment: (NOTE) Roche Diagnostics Electrochemiluminescence Immunoassay (ECLIA) Values obtained with different assay methods or kits cannot be used interchangeably.  Results cannot be interpreted as absolute evidence of the presence or absence of malignant disease. This test is not interpretable in pregnant females. Performed At: [facility] [place], [place] [id number] Rush Farmer MD KP:[id number]   Culture, blood (single)     Status: None (Preliminary result)   Collection Time: 10/23/21 12:53 PM    Specimen: BLOOD LEFT HAND  Result Value Ref Range   Specimen Description BLOOD LEFT HAND    Special Requests      BOTTLES DRAWN AEROBIC AND ANAEROBIC Blood Culture results may not be optimal due to an inadequate volume of blood received in culture bottles   Culture      NO GROWTH < 24 HOURS Performed at [facility], [address] [place], [place] [address]    Report Status PENDING   Lactic acid, plasma     Status: Abnormal   Collection Time: 10/23/21 12:53 PM  Result Value Ref Range   Lactic Acid, Venous 7.8 (HH) 0.5 - 1.9 mmol/L    Comment: CRITICAL RESULT CALLED TO, READ BACK BY AND VERIFIED WITH: HEATHER CRAWFORD @ [phone] ON 10/23/21 C VARNER Performed at [facility], [address], [place], [place] [address]   Body fluid cell count with differential     Status: Abnormal   Collection Time: 10/23/21  2:05 PM  Result Value Ref Range   Fluid Type-FCT PERITONEAL     Comment: CORRECTED ON 11/07 AT 1453: PREVIOUSLY REPORTED AS Peritoneal   Color, Fluid YELLOW (A) YELLOW   Appearance, Fluid CLEAR CLEAR   Total Nucleated Cell Count, Fluid 456 0 - 1,000 cu mm   Neutrophil Count, Fluid 78 (H) 0 - 25 %   Lymphs, Fluid 12 %   Monocyte-Macrophage-Serous Fluid 10 (L) 50 - 90 %    Comment: Performed at [facility], [address], [place], [place] [address]  Culture, body fluid w Gram Stain-bottle     Status: None (Preliminary result)   Collection Time: 10/23/21  2:05 PM   Specimen: Peritoneal Washings  Result Value Ref  Range   Specimen Description PERITONEAL    Special Requests      BOTTLES DRAWN AEROBIC AND ANAEROBIC Blood Culture adequate volume   Culture      NO GROWTH < 24 HOURS Performed at [facility], [address], [place], [place] [address]    Report Status PENDING   Gram stain     Status: None   Collection Time: 10/23/21  2:05 PM   Specimen: Peritoneal Washings  Result Value Ref Range   Specimen Description PERITONEAL    Special Requests NONE    Gram Stain      GRAM  POSITIVE COCCI IN CLUSTERS CYTOSPIN SMEAR Gram Stain Report Called to,Read Back By and Verified With: Coralie Common RN [phone] K FORSYTH Performed at [facility], [address] [place], [place] [address]    Report Status 10/23/2021 FINAL   Resp Panel by RT-PCR (Flu A&B, Covid) Nasopharyngeal Swab     Status: None   Collection Time: 10/23/21  2:10 PM   Specimen: Nasopharyngeal Swab; Nasopharyngeal(NP) swabs in vial transport medium  Result Value Ref Range   SARS Coronavirus 2 by RT PCR NEGATIVE NEGATIVE    Comment: (NOTE) SARS-CoV-2 target nucleic acids are NOT DETECTED.  The SARS-CoV-2 RNA is generally detectable in upper respiratory specimens during the acute phase of infection. The lowest concentration of SARS-CoV-2 viral copies this assay can detect is 138 copies/mL. A negative result does not preclude SARS-Cov-2 infection and should not be used as the sole basis for treatment or other patient management decisions. A negative result may occur with  improper specimen collection/handling, submission of specimen other than nasopharyngeal swab, presence of viral mutation(s) within the areas targeted by this assay, and inadequate number of viral copies(<138 copies/mL). A negative result must be combined with clinical observations, patient history, and epidemiological information. The expected result is Negative.  Fact Sheet for Patients:  EntrepreneurPulse.com.au  Fact Sheet for Healthcare Providers:  IncredibleEmployment.be  This test is no t yet approved or cleared by the [place] FDA and  has been authorized for detection and/or diagnosis of SARS-CoV-2 by FDA under an Emergency Use Authorization (EUA). This EUA will remain  in effect (meaning this test can be used) for the duration of the COVID-19 declaration under Section 564(b)(1) of the Act, 21 U.S.C.section 360bbb-3(b)(1), unless the authorization is terminated  or revoked sooner.        Influenza A by PCR NEGATIVE NEGATIVE   Influenza B by PCR NEGATIVE NEGATIVE    Comment: (NOTE) The Xpert Xpress SARS-CoV-2/FLU/RSV plus assay is intended as an aid in the diagnosis of influenza from Nasopharyngeal swab specimens and should not be used as a sole basis for treatment. Nasal washings and aspirates are unacceptable for Xpert Xpress SARS-CoV-2/FLU/RSV testing.  Fact Sheet for Patients: EntrepreneurPulse.com.au  Fact Sheet for Healthcare Providers: IncredibleEmployment.be  This test is not yet approved or cleared by the [place] FDA and has been authorized for detection and/or diagnosis of SARS-CoV-2 by FDA under an Emergency Use Authorization (EUA). This EUA will remain in effect (meaning this test can be used) for the duration of the COVID-19 declaration under Section 564(b)(1) of the Act, 21 U.S.C. section 360bbb-3(b)(1), unless the authorization is terminated or revoked.  Performed at [facility], [address], [place], [place] [address]   Lactic acid, plasma     Status: Abnormal   Collection Time: 10/23/21  4:34 PM  Result Value Ref Range   Lactic Acid, Venous 5.3 (HH) 0.5 -  1.9 mmol/L    Comment: CRITICAL VALUE NOTED.  VALUE IS CONSISTENT WITH PREVIOUSLY REPORTED AND CALLED VALUE. Performed at [facility], [address] [place], [place] [address]   HIV Antibody (routine testing w rflx)     Status: None   Collection Time: 10/23/21  4:34 PM  Result Value Ref Range   HIV Screen 4th Generation wRfx Non Reactive Non Reactive    Comment: Performed at [facility], [place] [address] [place], [place] [address]  Procalcitonin - Baseline     Status: None   Collection Time: 10/23/21  4:34 PM  Result Value Ref Range   Procalcitonin 7.26 ng/mL    Comment:        Interpretation: PCT > 2 ng/mL: Systemic infection (sepsis) is likely, unless other causes are known. (NOTE)       Sepsis PCT Algorithm           Lower  Respiratory Tract                                      Infection PCT Algorithm    ----------------------------     ----------------------------         PCT < 0.25 ng/mL                PCT < 0.10 ng/mL          Strongly encourage             Strongly discourage   discontinuation of antibiotics    initiation of antibiotics    ----------------------------     -----------------------------       PCT 0.25 - 0.50 ng/mL            PCT 0.10 - 0.25 ng/mL               OR       >80% decrease in PCT            Discourage initiation of                                            antibiotics      Encourage discontinuation           of antibiotics    ----------------------------     -----------------------------         PCT >= 0.50 ng/mL              PCT 0.26 - 0.50 ng/mL               AND       <80% decrease in PCT              Encourage initiation of                                             antibiotics       Encourage continuation           of antibiotics    ----------------------------     -----------------------------        PCT >= 0.50 ng/mL                  PCT >  0.50 ng/mL               AND         increase in PCT                  Strongly encourage                                      initiation of antibiotics    Strongly encourage escalation           of antibiotics                                     -----------------------------                                           PCT <= 0.25 ng/mL                                                 OR                                        > 80% decrease in PCT                                      Discontinue / Do not initiate                                             antibiotics  Performed at [facility], [address], [place], [place] [address]   MRSA Next Gen by PCR, Nasal     Status: None   Collection Time: 10/23/21  6:17 PM   Specimen: Nasal Mucosa; Nasal Swab  Result Value Ref Range   MRSA by PCR Next Gen NOT DETECTED NOT  DETECTED    Comment: (NOTE) The GeneXpert MRSA Assay (FDA approved for NASAL specimens only), is one component of a comprehensive MRSA colonization surveillance program. It is not intended to diagnose MRSA infection nor to guide or monitor treatment for MRSA infections. Test performance is not FDA approved in patients less than 75 years old. Performed at [facility], [address], [place], [place] [address]   Magnesium     Status: None   Collection Time: 10/24/21  5:17 AM  Result Value Ref Range   Magnesium 2.2 1.7 - 2.4 mg/dL    Comment: Performed at [facility], [address] [place], [place] [address]  Comprehensive metabolic panel     Status: Abnormal   Collection Time: 10/24/21  5:17 AM  Result Value Ref Range   Sodium 129 (L) 135 - 145 mmol/L   Potassium 3.9 3.5 - 5.1 mmol/L   Chloride 96 (L) 98 - 111 mmol/L   CO2 26 22 - 32 mmol/L   Glucose, Bld 47 (L)  70 - 99 mg/dL    Comment: Glucose reference range applies only to samples taken after fasting for at least 8 hours.   BUN 37 (H) 6 - 20 mg/dL   Creatinine, Ser 1.18 0.61 - 1.24 mg/dL   Calcium 8.6 (L) 8.9 - 10.3 mg/dL   Total Protein 4.7 (L) 6.5 - 8.1 g/dL   Albumin 2.3 (L) 3.5 - 5.0 g/dL   AST 65 (H) 15 - 41 U/L   ALT 29 0 - 44 U/L   Alkaline Phosphatase 386 (H) 38 - 126 U/L   Total Bilirubin 5.4 (H) 0.3 - 1.2 mg/dL   GFR, Estimated >60 >60 mL/min    Comment: (NOTE) Calculated using the CKD-EPI Creatinine Equation (2021)    Anion gap 7 5 - 15    Comment: Performed at [facility], [address] [place], [place] [address]  CBC     Status: Abnormal   Collection Time: 10/24/21  5:17 AM  Result Value Ref Range   WBC 100.3 (HH) 4.0 - 10.5 K/uL    Comment: This critical result has verified and been called to [facility] by Lorette Ang on 11 08 2022 at 0555, and has been read back.    RBC 3.22 (L) 4.22 - 5.81 MIL/uL   Hemoglobin 9.4 (L) 13.0 - 17.0 g/dL   HCT 28.9 (L) 39.0 - 52.0 %   MCV 89.8 80.0 - 100.0 fL   MCH  29.2 26.0 - 34.0 pg   MCHC 32.5 30.0 - 36.0 g/dL   RDW 20.8 (H) 11.5 - 15.5 %   Platelets 76 (L) 150 - 400 K/uL    Comment: Immature Platelet Fraction may be clinically indicated, consider ordering this additional test [id number]    nRBC 0.0 0.0 - 0.2 %    Comment: Performed at [facility], [address] [place], [place] [address]  Protime-INR     Status: Abnormal   Collection Time: 10/24/21  5:17 AM  Result Value Ref Range   Prothrombin Time 19.4 (H) 11.4 - 15.2 seconds   INR 1.6 (H) 0.8 - 1.2    Comment: (NOTE) INR goal varies based on device and disease states. Performed at [facility], [address], [place], [place] [address]   Ammonia     Status: Abnormal   Collection Time: 10/24/21  5:17 AM  Result Value Ref Range   Ammonia 49 (H) 9 - 35 umol/L    Comment: Performed at [facility], [address], [place], [place] [address]  Lactic acid, plasma     Status: Abnormal   Collection Time: 10/24/21  5:17 AM  Result Value Ref Range   Lactic Acid, Venous 2.4 (HH) 0.5 - 1.9 mmol/L    Comment: CRITICAL VALUE NOTED.  VALUE IS CONSISTENT WITH PREVIOUSLY REPORTED AND CALLED VALUE. Performed at [facility], [address], [place], [place] [address]   Procalcitonin     Status: None   Collection Time: 10/24/21  5:17 AM  Result Value Ref Range   Procalcitonin 4.23 ng/mL    Comment:        Interpretation: PCT > 2 ng/mL: Systemic infection (sepsis) is likely, unless other causes are known. (NOTE)       Sepsis PCT Algorithm           Lower Respiratory Tract  Infection PCT Algorithm    ----------------------------     ----------------------------         PCT < 0.25 ng/mL                PCT < 0.10 ng/mL          Strongly encourage             Strongly discourage   discontinuation of antibiotics    initiation of antibiotics    ----------------------------     -----------------------------       PCT 0.25 - 0.50 ng/mL            PCT 0.10 - 0.25  ng/mL               OR       >80% decrease in PCT            Discourage initiation of                                            antibiotics      Encourage discontinuation           of antibiotics    ----------------------------     -----------------------------         PCT >= 0.50 ng/mL              PCT 0.26 - 0.50 ng/mL               AND       <80% decrease in PCT              Encourage initiation of                                             antibiotics       Encourage continuation           of antibiotics    ----------------------------     -----------------------------        PCT >= 0.50 ng/mL                  PCT > 0.50 ng/mL               AND         increase in PCT                  Strongly encourage                                      initiation of antibiotics    Strongly encourage escalation           of antibiotics                                     -----------------------------                                           PCT <= 0.25 ng/mL  OR                                        > 80% decrease in PCT                                      Discontinue / Do not initiate                                             antibiotics  Performed at [facility], [address], [place], [place] [address]   TSH     Status: None   Collection Time: 10/24/21  5:17 AM  Result Value Ref Range   TSH 2.986 0.350 - 4.500 uIU/mL    Comment: Performed by a 3rd Generation assay with a functional sensitivity of <=0.01 uIU/mL. Performed at [facility], [address], [place], [place] [address]   Osmolality     Status: None   Collection Time: 10/24/21  5:17 AM  Result Value Ref Range   Osmolality 283 275 - 295 mOsm/kg    Comment: Performed at [facility] [address] [place], [place] [address]  Bilirubin, fractionated(tot/dir/indir)     Status: Abnormal   Collection Time: 10/24/21  5:17 AM  Result Value Ref Range   Total  Bilirubin 5.1 (H) 0.3 - 1.2 mg/dL   Bilirubin, Direct 2.3 (H) 0.0 - 0.2 mg/dL   Indirect Bilirubin 2.8 (H) 0.3 - 0.9 mg/dL    Comment: Performed at [facility], [address] [place], [place] [address]  Osmolality, urine     Status: None   Collection Time: 10/24/21  9:47 AM  Result Value Ref Range   Osmolality, Ur 532 300 - 900 mOsm/kg    Comment: Performed at [facility] [address] [place], [place] [address]  Sodium, urine, random     Status: None   Collection Time: 10/24/21  9:47 AM  Result Value Ref Range   Sodium, Ur 29 mmol/L    Comment: Performed at [facility], [address], [place], [place] [address]  Glucose, capillary     Status: None   Collection Time: 10/24/21  4:09 PM  Result Value Ref Range   Glucose-Capillary 73 70 - 99 mg/dL    Comment: Glucose reference range applies only to samples taken after fasting for at least 8 hours.      RADIOGRAPHY: CT ABDOMEN PELVIS WO CONTRAST  Result Date: 10/23/2021 CLINICAL DATA:  Abdominal distension. Fall. Low back pain. Cirrhosis. Diabetes. EXAM: CT ABDOMEN AND PELVIS WITHOUT CONTRAST TECHNIQUE: Multidetector CT imaging of the abdomen and pelvis was performed following the standard protocol without IV contrast. COMPARISON:  05/20/2020 abdominal ultrasound. 10/07/2018 abdominopelvic CT. FINDINGS: Lower chest: right greater than left dependent lower lobe airspace disease. A 3 mm lingular nodule on 06/05 is not readily apparent on the prior. Small right pleural effusion is new. Normal heart size. Bilateral gynecomastia. Marked periesophageal varices. Hepatobiliary: Subtle hypoattenuating right hepatic lobe mass including at 8.2 x 8.8 cm, suboptimally evaluated on this noncontrast CT. Example 19/3. Likely separate more inferior right hepatic lobe hypoattenuating lesion of 2.2 cm on 30/3. Normal gallbladder, without biliary ductal dilatation. Pancreas: Pancreatic  atrophy, without duct dilatation. Spleen: Splenomegaly, including at  18.4 cm craniocaudal. Adrenals/Urinary Tract: Normal adrenal glands. Bilateral renal collecting system calculi, maximally 1.0 cm in the interpolar left kidney. No hydronephrosis. No hydroureter or ureteric calculi. No bladder calculi. Stomach/Bowel: The gastric body is underdistended but appears thick walled on 22/3. The colon is underdistended and poorly evaluated. Normal small bowel caliber. Vascular/Lymphatic: Aortic atherosclerosis. Extensive perigastric varices. Small abdominal retroperitoneal nodes are likely reactive in the setting of cirrhosis. Index precaval 10 mm node on 38/3 is similar. No pelvic sidewall adenopathy. Reproductive: Normal prostate. Other: Moderate volume abdominopelvic ascites. No free intraperitoneal air. Musculoskeletal: No acute osseous abnormality. IMPRESSION: 1. No posttraumatic deformity identified. 2. Cirrhosis and portal venous hypertension with moderate volume ascites. Hypoattenuating liver lesions, suspicious for multifocal hepatocellular carcinoma. When the patient is clinically stable and able to follow directions and hold their breath (preferably as an outpatient) further evaluation with dedicated abdominal MRI should be considered. 3. Right pleural effusion with bibasilar airspace disease. Most likely atelectasis. At the right lung base, infection cannot be excluded. 4. Bilateral nephrolithiasis. 5. Gastric wall thickening in the setting of underdistention. Recommend clinical exclusion of gastritis. Hypoalbuminemia could create a similar appearance. 6. 3 mm lingular nodule. No follow-up needed if patient is low-risk. Non-contrast chest CT can be considered in 12 months if patient is high-risk. This recommendation follows the consensus statement: Guidelines for Management of Incidental Pulmonary Nodules Detected on CT Images: From the Fleischner Society 2017; Radiology 2017; 284:228-243. 7. Bilateral gynecomastia. These results were called by telephone at the time of  interpretation on 10/23/2021 at 12:37 pm to provider [facility] ZAMMIT , who verbally acknowledged these results. Electronically Signed   By: Abigail Miyamoto M.D.   On: 10/23/2021 12:38   US Paracentesis  Result Date: 10/23/2021 INDICATION: Cirrhosis, ascites EXAM: ULTRASOUND GUIDED DIAGNOSTIC AND THERAPEUTIC PARACENTESIS MEDICATIONS: None. COMPLICATIONS: None immediate. PROCEDURE: Informed written consent was obtained from the patient after a discussion of the risks, benefits and alternatives to treatment. A timeout was performed prior to the initiation of the procedure. Initial ultrasound scanning demonstrates a large amount of ascites within the LEFT lower abdominal quadrant. The right lower abdomen was prepped and draped in the usual sterile fashion. 1% lidocaine was used for local anesthesia. Following this, a 5 [place] Yueh catheter was introduced. An ultrasound image was saved for documentation purposes. The paracentesis was performed. The catheter was removed and a dressing was applied. The patient tolerated the procedure well without immediate post procedural complication. FINDINGS: A total of approximately 3.6 L of yellow ascitic fluid was removed. Samples were sent to the laboratory as requested by the clinical team. IMPRESSION: Successful ultrasound-guided paracentesis yielding 3.6 liters of peritoneal fluid. Electronically Signed   By: Lavonia Dana M.D.   On: 10/23/2021 15:00   DG Chest Port 1 View  Result Date: 10/23/2021 CLINICAL DATA:  Weakness EXAM: PORTABLE CHEST 1 VIEW COMPARISON:  Chest x-ray 09/22/2021 FINDINGS: Heart size is upper normal. Mediastinum appears stable. Mild calcified plaques in the aortic arch. Mild pulmonary vascular/interstitial prominence. Small left and moderate right pleural effusion with associated atelectasis/infiltrate. No pneumothorax. IMPRESSION: Bilateral pleural effusions right greater than left. Mild pulmonary vascular prominence. Electronically Signed   By: Ofilia Neas M.D.   On: 10/23/2021 11:56   US LIVER DOPPLER  Result Date: 10/24/2021 CLINICAL DATA:  60 year old male with a history of hepatic cirrhosis and recent imaging findings concerning for hepatocellular carcinoma and portal vein occlusion. EXAM: DUPLEX ULTRASOUND OF LIVER  TECHNIQUE: Color and duplex Doppler ultrasound was performed to evaluate the hepatic in-flow and out-flow vessels. COMPARISON:  CT abdomen/pelvis 10/23/2021 FINDINGS: Liver: Markedly nodular hepatic contour. Diffuse heterogeneity of the hepatic parenchyma. Discrete hepatic lesions are difficult to visualize given the heterogeneity of the background parenchyma. No biliary ductal dilatation. 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 Heterogeneously echogenic material is visualized within the vessel lumen. Hepatic Vein Velocities Right:  42 cm/sec Middle:  45 cm/sec Left:  43 cm/sec IVC: Questionable thrombus visualized within the IVC. Hepatic Artery Velocity:  46 cm/sec Splenic Vein Velocity: Occluded Spleen: 18.1 cm x 7.3 cm x 19.6 cm with a total volume of 1,352 cm^3 (411 cm^3 is upper limit normal) Portal Vein Occlusion/Thrombus: Yes Splenic Vein Occlusion/Thrombus: Yes Ascites: Present Varices: Present IMPRESSION: 1. 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 It is unclear if this represents bland thrombus or tumor thrombus. 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 2. Questionable thrombus within the inferior vena cava. If truly representative of thrombus, this would almost certainly be tumor thrombus extending through the hepatic veins into the IVC. 3. Small cirrhotic liver with heavily nodular contour. 4. Although suspected on CT imaging, discrete hepatic mass and lesions not visualized likely due to the severe heterogeneity of the background parenchyma. 5. Ascites. Recommend further evaluation with gadolinium-enhanced MRI of the abdomen to assess for hepatocellular carcinoma and  tumor thrombus within the IVC and portal veins. Electronically Signed   By: Jacqulynn Cadet M.D.   On: 10/24/2021 13:17         ASSESSMENT:  1.  Leukemoid reaction: - White count on presentation 137, left shift.  White count today improved to 100.3. - Prior white count on 07/24/2021 was normal at 5.4. - Blood cultures from 10/23/2021 showed aerobic and anaerobic-gram-positive cocci in clusters - He is being treated for SBP.  2.  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  Cirrhosis complicated by ascites and nonbleeding esophageal varices, status post banding (11/2019 EGD showed G2-3 varices) at [facility].  No known hepatic encephalopathy. - CTAP on 10/23/2021 without contrast showed hypoattenuating liver lesions, suspicious for multifocal HCC. - AFP elevated at 64.3 (10/23/2021).  AFP 9.3 (08/02/2021) at [facility]. - Ultrasound Doppler of the liver on 10/24/2021 showed complete occlusion of the main and intrahepatic portal veins, unclear if it is plan thrombus or tumor thrombus.  Questionable thrombus within the IVC.  Small cirrhotic liver with heavily nodular contour.  No discrete hepatic mass lesion identified. - Paracentesis of 3.6 L on 10/24/2021.  3.  Social/family history: - Lives at home with his daughter who works as an Therapist, sports at [employer].  He used to work as a Designer, industrial/product at [employer].  Quit smoking several years ago. - Maternal grand mother had pancreatic cancer.  Maternal grandfather had lung cancer.  Paternal grandfather had lung cancer.  PLAN:  1.  Leukemoid reaction: - Elevated white count, mostly left shift, improved to 200 from 137. - As his white count was normal in August, unlikely to be any hematological malignancy. - No further testing needed.  2.  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 - Also  agree with EGD and banding of varices. - We will follow-up on the MRI of the liver results and will discuss with patient.  All questions were answered. The patient knows to call the clinic with any problems, questions or concerns. We can certainly see the patient much sooner if necessary.   Derek Jack

## 2021-10-24 NOTE — Progress Notes (Signed)
Patient is in good spirits this evening. His only complaint is of lower back pain, which I pulled and gave tramadol for. Patient is resting now. Will continue to monitor for the duration of the shift.

## 2021-10-24 NOTE — Progress Notes (Signed)
Initial Nutrition Assessment  DOCUMENTATION CODES:   Severe malnutrition in context of acute illness/injury  INTERVENTION:  Heart Healthy diet with 1500 ml daily fluid restriction  Ensure Enlive po BID, each supplement provides 350 kcal and 20 grams of protein   NUTRITION DIAGNOSIS:   Severe Malnutrition related to acute illness (decompensated cirrhosis ascites) as evidenced by energy intake < or equal to 50% for > or equal to 5 days, moderate fat depletion, moderate muscle depletion, severe fat depletion, severe muscle depletion, edema.   GOAL:  Patient will meet greater than or equal to 90% of their needs   MONITOR:  I & O's, Labs, PO intake, Weight trends  REASON FOR ASSESSMENT:   Malnutrition Screening Tool    ASSESSMENT: Patient is a 60 yo male with intra-abdominal infection, decompensated cirrhosis ascites. S/p paracentesis (3.6 L removed) per GI. MRI pending question of hepatocellular carcinoma. Hyponatremia in the setting of volume overload.   PMH: DM2, Cirrhosis, depression, arthritis.  Patient ate 75% of breakfast this morning and says he feels like he could eat some lunch. Feeding himself. Patient has been eating poorly the past 3 weeks. Breakfast is his best meal fried egg and toast, he may eat a couple of crackers for lunch and that is it for the day.   Weight loss since August 8 kg (8%) in < 3 months which is significant. Patient has deep pitting edema BLE and ascites which is masking actual dry wt. Suspect wt loss is more severe given his minimal oral intake the past 3 weeks.   Medications reviewed and include: ferous sulfate, Protonix.   Drip: Albumin 25 gr x 4 doses. Maxipime, vancomycin.   Labs: A1C-6.1 % BMP Latest Ref Rng & Units 10/24/2021 10/23/2021 07/24/2021  Glucose 70 - 99 mg/dL 47(L) 85 244(H)  BUN 6 - 20 mg/dL 37(H) 35(H) 10  Creatinine 0.61 - 1.24 mg/dL 1.18 1.39(H) 1.03  BUN/Creat Ratio 6 - 22 (calc) - - -  Sodium 135 - 145 mmol/L 129(L) 132(L)  137  Potassium 3.5 - 5.1 mmol/L 3.9 4.1 3.4(L)  Chloride 98 - 111 mmol/L 96(L) 93(L) 104  CO2 22 - 32 mmol/L 26 22 24   Calcium 8.9 - 10.3 mg/dL 8.6(L) 9.5 8.9      NUTRITION - FOCUSED PHYSICAL EXAM:  Flowsheet Row Most Recent Value  Orbital Region Moderate depletion  Upper Arm Region Severe depletion  Thoracic and Lumbar Region Moderate depletion  Buccal Region Mild depletion  Temple Region Mild depletion  Clavicle Bone Region Severe depletion  Clavicle and Acromion Bone Region Moderate depletion  Dorsal Hand Severe depletion  Patellar Region Moderate depletion  Anterior Thigh Region Moderate depletion  Edema (RD Assessment) Severe  Hair Unable to assess  Eyes Reviewed  Mouth Reviewed  Skin Reviewed  Nails Reviewed      Diet Order:   Diet Order             Diet Heart Room service appropriate? Yes; Fluid consistency: Thin; Fluid restriction: 1500 mL Fluid  Diet effective now                   EDUCATION NEEDS:  Education needs have been addressed  Skin:  Skin Assessment: Reviewed RN Assessment  Last BM:  11/7 distended , gross ascites per nursing  Height:   Ht Readings from Last 1 Encounters:  08/04/21 6' 2"  (1.88 m)    Weight:   Wt Readings from Last 1 Encounters:  10/24/21 95.2 kg  Ideal Body Weight:   86 kg  BMI:  Body mass index is 26.95 kg/m.  Estimated Nutritional Needs: based on EDW  Kcal:  2400-2600  Protein:  120-129 gr  Fluid:  1500 ml daily   Colman Cater MS,RD,CSG,LDN Contact: Shea Evans

## 2021-10-24 NOTE — Progress Notes (Signed)
PROGRESS NOTE   Lawrence Rogers  MBE:[id number] DOB: 07/31/1961 DOA: 10/23/2021 PCP: [name], MD   No chief complaint on file.   Level of care: Telemetry  Brief Admission History:  Lawrence Rogers is a 60 y.o. 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  At presentation to ED, patient reported weakness, denies syncope, loss of consciousness or dizziness. CT abdomen and pelvis showed no posttraumatic deformities. Patient was fluid overloaded with ascites.  Labs at that time showed a leukocytosis 137.1, concerning for malignancy.  Patient MRI was concerning for hepatocellular carcinoma.  Both GI and oncology has been consulted.  Patient is status post large-volume paracentesis of 3.6 L on 11/8. Fluid analysis consistent with SBP, gram stain with gram-positive cocci in clusters; Continue IV antibiotics.  Imaging for falls showed   Assessment & Plan:   Active Problems:   Cirrhosis of liver with ascites (HCC)   Ascites   SBP (spontaneous bacterial peritonitis) ([place])   Lesion of liver   Hyperammonemia (HCC)   Peripheral edema   Decompensated cirrhosis with SBP -Significant volume overload with noted noncompliance with diuretics at presentation. -S/p paracentesis on 11/7, per GI performed with over 3 L removed. -fluid analysis consistent with SBP (355 PMNs); gram stain with gram-positive cocci in clusters. -Continue broad-spectrum antibiotics -Continue IV albumin. -Continue to hold diuretics for now given some AKI as well as lactic acidosis; although both are improving. 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   Transaminitis secondary to above -MRI concerning for hepatocellular carcinoma -Labs stable/improving today. AST 77>> 65, alk phos 577>> 386, total bilirubin  5.5>> 5.4. -Per GI note, patient will need additional imaging as outpatient. -Fractionated bilirubin ordered by GI.   Progressive weakness/debility with fall-multifactorial -No acute injuries noted -PT/OT evaluation -Fall precautions   Leukocytosis -Concerning for malignancy -Oncology has been consulted -WBC downtrending; 137.1>> 100.3 -Procalcitonin downtrending; 7.26>> 4.23 -Although patient leukocytosis still elevated, decrease after initiation of antibiotics confirms infection likely    Lactic acidosis -Likely in the setting of hematological malignancy and not likely sepsis physiology -Lactic acid downtrending; 7.8>>5.3>>2.4   Hyponatremia/AKI -Likely in the setting of prerenal causes with suspicion of hepatorenal syndrome -IV albumin as ordered per GI -Continue to monitor strict I's and O's along with daily weights -Avoid nephrotoxic agents and diuretics currently withheld -Sodium of 129, from 132 yesterday.  Secondary to volume overload.  We will continue to monitor.  DVT prophylaxis:  Code Status:  Family Communication:  Disposition:  Status is: Inpatient  Remains inpatient appropriate because: Further work-up needed and treatment for decompensated cirrhosis with SBP.       Consultants:  GI and oncology  Procedures:  Large-volume paracentesis  Antimicrobials:  Cefepime Vancomycin  Subjective: Patient alert and oriented x3.  Patient states that he feels a little weak this morning, but otherwise feels okay.  Patient does endorse pain on his left hip where he fell.  But otherwise no other complaints.  Denies headache, chest pain, shortness of breath, or palpitations.  Objective: Vitals:   10/23/21 2045 10/24/21 0456 10/24/21 0500 10/24/21 1336  BP: (!) 111/51 117/62  95/78  Pulse: 91 91  99  Resp: _0 Temp: 97.6 F (36.4 C) 98.4 F (36.9 C)  98.2 F (36.8 C)  TempSrc: Oral  SpO2: 97% 94%  98%  Weight:   95.2 kg     Intake/Output Summary  (Last 24 hours) at 10/24/2021 1447 Last data filed at 10/24/2021 1300 Gross per 24 hour  Intake 1797.59 ml  Output 550 ml  Net 1247.59 ml   Filed Weights   10/23/21 1700 10/24/21 0500  Weight: 101 kg 95.2 kg    Examination:  General exam: Appears calm and comfortable  Respiratory system: Clear to auscultation. Respiratory effort normal. Cardiovascular system: normal S1 & S2 heard. No JVD, murmurs, rubs, gallops or clicks. No pedal edema. Gastrointestinal system: Abdomen is nondistended, soft and nontender. No organomegaly or masses felt. Normal bowel sounds heard. Central nervous system: Alert and oriented. No focal neurological deficits. Extremities: Symmetric 5 x 5 power. Skin: No rashes, lesions or ulcers Psychiatry: Judgement and insight appear normal. Mood & affect appropriate.   Data Reviewed: I have personally reviewed following labs and imaging studies  CBC: Recent Labs  Lab 10/23/21 1044 10/24/21 0517  WBC 137.1* 100.3*  NEUTROABS 128.9*  --   HGB 11.6* 9.4*  HCT 36.2* 28.9*  MCV 89.8 89.8  PLT 111* 76*    Basic Metabolic Panel: Recent Labs  Lab 10/23/21 1044 10/24/21 0517  NA 132* 129*  K 4.1 3.9  CL 93* 96*  CO2 22 26  GLUCOSE 85 47*  BUN 35* 37*  CREATININE 1.39* 1.18  CALCIUM 9.5 8.6*  MG  --  2.2    GFR: Estimated Creatinine Clearance: 78.4 mL/min (by C-G formula based on SCr of 1.18 mg/dL).  Liver Function Tests: Recent Labs  Lab 10/23/21 1044 10/24/21 0517  AST 77* 65*  ALT 37 29  ALKPHOS 577* 386*  BILITOT 5.5* 5.1*  5.4*  PROT 5.4* 4.7*  ALBUMIN 2.2* 2.3*    CBG: No results for input(s): GLUCAP in the last 168 hours.  Recent Results (from the past 240 hour(s))  Culture, blood (single)     Status: None (Preliminary result)   Collection Time: 10/23/21 12:52 PM   Specimen: Right Antecubital; Blood  Result Value Ref Range Status   Specimen Description   Final    RIGHT ANTECUBITAL Performed at [facility], [address] [place], [place] [address]    Special Requests   Final    BOTTLES DRAWN AEROBIC AND ANAEROBIC Blood Culture adequate volume Performed at [facility], [address], [place], [place] [address]    Culture  Setup Time   Final    GRAM POSITIVE COCCI IN CLUSTERS AEB AND ANA BOTTLES Gram Stain Report Called to,Read Back By and Verified With: Coralie Common RN [phone] K FORSYTH   Culture   Final    CULTURE REINCUBATED FOR BETTER GROWTH Performed at [facility], [place] [address] [place], [place] [address]    Report Status PENDING  Incomplete  Blood Culture ID Panel (Reflexed)     Status: None   Collection Time: 10/23/21 12:52 PM  Result Value Ref Range Status   Enterococcus faecalis NOT DETECTED NOT DETECTED Final   Enterococcus Faecium NOT DETECTED NOT DETECTED Final   Listeria monocytogenes NOT DETECTED NOT DETECTED Final   Staphylococcus species NOT DETECTED NOT DETECTED Final   Staphylococcus aureus (BCID) NOT DETECTED NOT DETECTED Final   Staphylococcus epidermidis NOT DETECTED NOT DETECTED Final   Staphylococcus lugdunensis NOT DETECTED NOT DETECTED Final   Streptococcus species NOT DETECTED NOT DETECTED Final   Streptococcus agalactiae NOT DETECTED NOT DETECTED Final   Streptococcus pneumoniae NOT DETECTED  NOT DETECTED Final   Streptococcus pyogenes NOT DETECTED NOT DETECTED Final   A.calcoaceticus-baumannii NOT DETECTED NOT DETECTED Final   Bacteroides fragilis NOT DETECTED NOT DETECTED Final   Enterobacterales NOT DETECTED NOT DETECTED Final   Enterobacter cloacae complex NOT DETECTED NOT DETECTED Final   Escherichia coli NOT DETECTED NOT DETECTED Final   Klebsiella aerogenes NOT DETECTED NOT DETECTED Final   Klebsiella oxytoca NOT DETECTED NOT DETECTED Final   Klebsiella pneumoniae NOT DETECTED NOT DETECTED Final   Proteus species NOT DETECTED NOT DETECTED Final   Salmonella species NOT DETECTED NOT DETECTED Final   Serratia marcescens NOT DETECTED NOT DETECTED Final    Haemophilus influenzae NOT DETECTED NOT DETECTED Final   Neisseria meningitidis NOT DETECTED NOT DETECTED Final   Pseudomonas aeruginosa NOT DETECTED NOT DETECTED Final   Stenotrophomonas maltophilia NOT DETECTED NOT DETECTED Final   Candida albicans NOT DETECTED NOT DETECTED Final   Candida auris NOT DETECTED NOT DETECTED Final   Candida glabrata NOT DETECTED NOT DETECTED Final   Candida krusei NOT DETECTED NOT DETECTED Final   Candida parapsilosis NOT DETECTED NOT DETECTED Final   Candida tropicalis NOT DETECTED NOT DETECTED Final   Cryptococcus neoformans/gattii NOT DETECTED NOT DETECTED Final    Comment: Performed at [facility], [address], [place], [place] [address]  Culture, blood (single)     Status: None (Preliminary result)   Collection Time: 10/23/21 12:53 PM   Specimen: BLOOD LEFT HAND  Result Value Ref Range Status   Specimen Description BLOOD LEFT HAND  Final   Special Requests   Final    BOTTLES DRAWN AEROBIC AND ANAEROBIC Blood Culture results may not be optimal due to an inadequate volume of blood received in culture bottles   Culture   Final    NO GROWTH < 24 HOURS Performed at [facility], [address], [place], [place] [address]    Report Status PENDING  Incomplete  Culture, body fluid w Gram Stain-bottle     Status: None (Preliminary result)   Collection Time: 10/23/21  2:05 PM   Specimen: Peritoneal Washings  Result Value Ref Range Status   Specimen Description PERITONEAL  Final   Special Requests   Final    BOTTLES DRAWN AEROBIC AND ANAEROBIC Blood Culture adequate volume   Culture   Final    NO GROWTH < 24 HOURS Performed at [facility], [address] [place], [place] [address]    Report Status PENDING  Incomplete  Gram stain     Status: None   Collection Time: 10/23/21  2:05 PM   Specimen: Peritoneal Washings  Result Value Ref Range Status   Specimen Description PERITONEAL  Final   Special Requests NONE  Final   Gram Stain   Final     GRAM POSITIVE COCCI IN CLUSTERS CYTOSPIN SMEAR Gram Stain Report Called to,Read Back By and Verified With: Coralie Common RN [phone] K FORSYTH Performed at [facility], [address] [place], [place] [address]    Report Status 10/23/2021 FINAL  Final  Resp Panel by RT-PCR (Flu A&B, Covid) Nasopharyngeal Swab     Status: None   Collection Time: 10/23/21  2:10 PM   Specimen: Nasopharyngeal Swab; Nasopharyngeal(NP) swabs in vial transport medium  Result Value Ref Range Status   SARS Coronavirus 2 by RT PCR NEGATIVE NEGATIVE Final    Comment: (NOTE) SARS-CoV-2 target nucleic acids are NOT DETECTED.  The SARS-CoV-2 RNA is generally detectable in upper respiratory specimens during the  acute phase of infection. The lowest concentration of SARS-CoV-2 viral copies this assay can detect is 138 copies/mL. A negative result does not preclude SARS-Cov-2 infection and should not be used as the sole basis for treatment or other patient management decisions. A negative result may occur with  improper specimen collection/handling, submission of specimen other than nasopharyngeal swab, presence of viral mutation(s) within the areas targeted by this assay, and inadequate number of viral copies(<138 copies/mL). A negative result must be combined with clinical observations, patient history, and epidemiological information. The expected result is Negative.  Fact Sheet for Patients:  EntrepreneurPulse.com.au  Fact Sheet for Healthcare Providers:  IncredibleEmployment.be  This test is no t yet approved or cleared by the [place] FDA and  has been authorized for detection and/or diagnosis of SARS-CoV-2 by FDA under an Emergency Use Authorization (EUA). This EUA will remain  in effect (meaning this test can be used) for the duration of the COVID-19 declaration under Section 564(b)(1) of the Act, 21 U.S.C.section 360bbb-3(b)(1), unless the authorization is terminated   or revoked sooner.       Influenza A by PCR NEGATIVE NEGATIVE Final   Influenza B by PCR NEGATIVE NEGATIVE Final    Comment: (NOTE) The Xpert Xpress SARS-CoV-2/FLU/RSV plus assay is intended as an aid in the diagnosis of influenza from Nasopharyngeal swab specimens and should not be used as a sole basis for treatment. Nasal washings and aspirates are unacceptable for Xpert Xpress SARS-CoV-2/FLU/RSV testing.  Fact Sheet for Patients: EntrepreneurPulse.com.au  Fact Sheet for Healthcare Providers: IncredibleEmployment.be  This test is not yet approved or cleared by the [place] FDA and has been authorized for detection and/or diagnosis of SARS-CoV-2 by FDA under an Emergency Use Authorization (EUA). This EUA will remain in effect (meaning this test can be used) for the duration of the COVID-19 declaration under Section 564(b)(1) of the Act, 21 U.S.C. section 360bbb-3(b)(1), unless the authorization is terminated or revoked.  Performed at [facility], [address], [place], [place] [address]   MRSA Next Gen by PCR, Nasal     Status: None   Collection Time: 10/23/21  6:17 PM   Specimen: Nasal Mucosa; Nasal Swab  Result Value Ref Range Status   MRSA by PCR Next Gen NOT DETECTED NOT DETECTED Final    Comment: (NOTE) The GeneXpert MRSA Assay (FDA approved for NASAL specimens only), is one component of a comprehensive MRSA colonization surveillance program. It is not intended to diagnose MRSA infection nor to guide or monitor treatment for MRSA infections. Test performance is not FDA approved in patients less than 80 years old. Performed at [facility], [address] [place],  [address]      Radiology Studies: CT ABDOMEN PELVIS WO CONTRAST  Result Date: 10/23/2021 CLINICAL DATA:  Abdominal distension. Fall. Low back pain. Cirrhosis. Diabetes. EXAM: CT ABDOMEN AND PELVIS WITHOUT CONTRAST TECHNIQUE: Multidetector CT imaging of  the abdomen and pelvis was performed following the standard protocol without IV contrast. COMPARISON:  05/20/2020 abdominal ultrasound. 10/07/2018 abdominopelvic CT. FINDINGS: Lower chest: right greater than left dependent lower lobe airspace disease. A 3 mm lingular nodule on 06/05 is not readily apparent on the prior. Small right pleural effusion is new. Normal heart size. Bilateral gynecomastia. Marked periesophageal varices. Hepatobiliary: Subtle hypoattenuating right hepatic lobe mass including at 8.2 x 8.8 cm, suboptimally evaluated on this noncontrast CT. Example 19/3. Likely separate more inferior right hepatic lobe hypoattenuating lesion of 2.2 cm on 30/3. Normal gallbladder, without biliary ductal  dilatation. Pancreas: Pancreatic atrophy, without duct dilatation. Spleen: Splenomegaly, including at 18.4 cm craniocaudal. Adrenals/Urinary Tract: Normal adrenal glands. Bilateral renal collecting system calculi, maximally 1.0 cm in the interpolar left kidney. No hydronephrosis. No hydroureter or ureteric calculi. No bladder calculi. Stomach/Bowel: The gastric body is underdistended but appears thick walled on 22/3. The colon is underdistended and poorly evaluated. Normal small bowel caliber. Vascular/Lymphatic: Aortic atherosclerosis. Extensive perigastric varices. Small abdominal retroperitoneal nodes are likely reactive in the setting of cirrhosis. Index precaval 10 mm node on 38/3 is similar. No pelvic sidewall adenopathy. Reproductive: Normal prostate. Other: Moderate volume abdominopelvic ascites. No free intraperitoneal air. Musculoskeletal: No acute osseous abnormality. IMPRESSION: 1. No posttraumatic deformity identified. 2. Cirrhosis and portal venous hypertension with moderate volume ascites. Hypoattenuating liver lesions, suspicious for multifocal hepatocellular carcinoma. When the patient is clinically stable and able to follow directions and hold their breath (preferably as an outpatient) further  evaluation with dedicated abdominal MRI should be considered. 3. Right pleural effusion with bibasilar airspace disease. Most likely atelectasis. At the right lung base, infection cannot be excluded. 4. Bilateral nephrolithiasis. 5. Gastric wall thickening in the setting of underdistention. Recommend clinical exclusion of gastritis. Hypoalbuminemia could create a similar appearance. 6. 3 mm lingular nodule. No follow-up needed if patient is low-risk. Non-contrast chest CT can be considered in 12 months if patient is high-risk. This recommendation follows the consensus statement: Guidelines for Management of Incidental Pulmonary Nodules Detected on CT Images: From the Fleischner Society 2017; Radiology 2017; 284:228-243. 7. Bilateral gynecomastia. These results were called by telephone at the time of interpretation on 10/23/2021 at 12:37 pm to provider [facility] ZAMMIT , who verbally acknowledged these results. Electronically Signed   By: Abigail Miyamoto M.D.   On: 10/23/2021 12:38   US Paracentesis  Result Date: 10/23/2021 INDICATION: Cirrhosis, ascites EXAM: ULTRASOUND GUIDED DIAGNOSTIC AND THERAPEUTIC PARACENTESIS MEDICATIONS: None. COMPLICATIONS: None immediate. PROCEDURE: Informed written consent was obtained from the patient after a discussion of the risks, benefits and alternatives to treatment. A timeout was performed prior to the initiation of the procedure. Initial ultrasound scanning demonstrates a large amount of ascites within the LEFT lower abdominal quadrant. The right lower abdomen was prepped and draped in the usual sterile fashion. 1% lidocaine was used for local anesthesia. Following this, a 5 [place] Yueh catheter was introduced. An ultrasound image was saved for documentation purposes. The paracentesis was performed. The catheter was removed and a dressing was applied. The patient tolerated the procedure well without immediate post procedural complication. FINDINGS: A total of approximately 3.6 L of  yellow ascitic fluid was removed. Samples were sent to the laboratory as requested by the clinical team. IMPRESSION: Successful ultrasound-guided paracentesis yielding 3.6 liters of peritoneal fluid. Electronically Signed   By: Lavonia Dana M.D.   On: 10/23/2021 15:00   DG Chest Port 1 View  Result Date: 10/23/2021 CLINICAL DATA:  Weakness EXAM: PORTABLE CHEST 1 VIEW COMPARISON:  Chest x-ray 09/22/2021 FINDINGS: Heart size is upper normal. Mediastinum appears stable. Mild calcified plaques in the aortic arch. Mild pulmonary vascular/interstitial prominence. Small left and moderate right pleural effusion with associated atelectasis/infiltrate. No pneumothorax. IMPRESSION: Bilateral pleural effusions right greater than left. Mild pulmonary vascular prominence. Electronically Signed   By: Ofilia Neas M.D.   On: 10/23/2021 11:56   US LIVER DOPPLER  Result Date: 10/24/2021 CLINICAL DATA:  60 year old male with a history of hepatic cirrhosis and recent imaging findings concerning for hepatocellular carcinoma and portal vein occlusion. EXAM: DUPLEX ULTRASOUND  OF LIVER TECHNIQUE: Color and duplex Doppler ultrasound was performed to evaluate the hepatic in-flow and out-flow vessels. COMPARISON:  CT abdomen/pelvis 10/23/2021 FINDINGS: Liver: Markedly nodular hepatic contour. Diffuse heterogeneity of the hepatic parenchyma. Discrete hepatic lesions are difficult to visualize given the heterogeneity of the background parenchyma. No biliary ductal dilatation. 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 Heterogeneously echogenic material is visualized within the vessel lumen. Hepatic Vein Velocities Right:  42 cm/sec Middle:  45 cm/sec Left:  43 cm/sec IVC: Questionable thrombus visualized within the IVC. Hepatic Artery Velocity:  46 cm/sec Splenic Vein Velocity: Occluded Spleen: 18.1 cm x 7.3 cm x 19.6 cm with a total volume of 1,352 cm^3 (411 cm^3 is upper limit normal)  Portal Vein Occlusion/Thrombus: Yes Splenic Vein Occlusion/Thrombus: Yes Ascites: Present Varices: Present IMPRESSION: 1. 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 It is unclear if this represents bland thrombus or tumor thrombus. 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 2. Questionable thrombus within the inferior vena cava. If truly representative of thrombus, this would almost certainly be tumor thrombus extending through the hepatic veins into the IVC. 3. Small cirrhotic liver with heavily nodular contour. 4. Although suspected on CT imaging, discrete hepatic mass and lesions not visualized likely due to the severe heterogeneity of the background parenchyma. 5. Ascites. Recommend further evaluation with gadolinium-enhanced MRI of the abdomen to assess for hepatocellular carcinoma and tumor thrombus within the IVC and portal veins. Electronically Signed   By: Jacqulynn Cadet M.D.   On: 10/24/2021 13:17    Scheduled Meds:  feeding supplement  237 mL Oral BID BM   ferrous sulfate  325 mg Oral Q breakfast   lactulose  10 g Oral Daily   pantoprazole  40 mg Oral QAC breakfast   sodium chloride flush  3 mL Intravenous Q12H   spironolactone  50 mg Oral Daily   Continuous Infusions:  sodium chloride 250 mL (10/23/21 2057)   sodium chloride 10 mL (10/24/21 0103)   albumin human 25 g (10/24/21 0527)   ceFEPime (MAXIPIME) IV 2 g (10/24/21 1439)   metronidazole 500 mg (10/24/21 1436)   vancomycin 1,000 mg (10/24/21 0412)     LOS: 1 day   Time spent: [address]  Gerrit Halls, MS4   10/24/2021, 2:47 PM

## 2021-10-24 NOTE — Evaluation (Signed)
Occupational Therapy Evaluation Patient Details Name: Lawrence Rogers MRN: [id number] DOB: April 13, 1961 Today's Date: 10/24/2021   History of Present Illness Decompensated cirrhosis with ascites. Progressive weakness/debility with fall-multifactorial  -No acute injuries noted. -Fall precautions     Leukocytosis  Hyponatremia/AKI   Clinical Impression   Pt agreeable to OT evaluation. Pt reported mild pain in low back area at start of session. Pt able to sit at EOB from supine and don socks with mod I level of assist due to labored movement and extended time. Pt able to transfer from EOB to w/c for transport to procedure with Min G to supervision assist using cane. Pt has limited R UE A/ROM at baseline from previous shoulder injury that he has been receiving therapy for. Pt will benefit from continued OT in the hospital and recommended venue below to increase strength, balance, and endurance for safe ADL's.        Recommendations for follow up therapy are one component of a multi-disciplinary discharge planning process, led by the attending physician.  Recommendations may be updated based on patient status, additional functional criteria and insurance authorization.   Follow Up Recommendations  Home health OT (Dut to pt recent fall and family reports that pt is much weaker than just 3 weeks ago. If pt improves may benefit from simply continueing outpatient OT.)    Assistance Recommended at Discharge Intermittent Supervision/Assistance ([place] assist during mobility.)  Functional Status Assessment  Patient has had a recent decline in their functional status and demonstrates the ability to make significant improvements in function in a reasonable and predictable amount of time.  Equipment Recommendations  None recommended by OT    Recommendations for Other Services       Precautions / Restrictions Precautions Precautions: Fall Restrictions Weight Bearing Restrictions: No       Mobility Bed Mobility Overal bed mobility: Modified Independent             General bed mobility comments: labored movement    Transfers Overall transfer level: Needs assistance Equipment used: Straight cane Transfers: Sit to/from Stand;Bed to chair/wheelchair/BSC Sit to Stand: Supervision;Min guard Stand pivot transfers: Supervision;Min guard         General transfer comment: Mild balance deficit noted via posterior lean during transfer from EOB to w/c using cane.      Balance Overall balance assessment: Needs assistance Sitting-balance support: No upper extremity supported;Feet supported Sitting balance-Leahy Scale: Good Sitting balance - Comments: seated EOB   Standing balance support: Single extremity supported;During functional activity Standing balance-Leahy Scale: Fair Standing balance comment: fair to good with cane                           ADL either performed or assessed with clinical judgement   ADL Overall ADL's : Needs assistance/impaired                     Lower Body Dressing: Modified independent;Sitting/lateral leans Lower Body Dressing Details (indicate cue type and reason): Labored movement and extended time. Toilet Transfer: Supervision/safety;Min guard;Ambulation (cane) Toilet Transfer Details (indicate cue type and reason): Simulated via ambulatory transfer from EOB to w/c using cane.         Functional mobility during ADLs: Supervision/safety;Min guard;Cane       Vision Baseline Vision/History: 1 Wears glasses (reading) Ability to See in Adequate Light: 0 Adequate Patient Visual Report: No change from baseline Vision Assessment?: No apparent visual deficits  Pertinent Vitals/Pain Pain Assessment: 0-10 Pain Score: 3  Pain Location: "low spine" Pain Descriptors / Indicators: Aching Pain Intervention(s): Limited activity within patient's tolerance;Monitored during session;Repositioned      Hand Dominance Right   Extremity/Trunk Assessment Upper Extremity Assessment Upper Extremity Assessment: RUE deficits/detail;LUE deficits/detail RUE Deficits / Details: Pt has been going to outpatient therapy for previous shoulder injury. 3-/5 MMT today with WFL abduction P/ROM and mild limitation in shoulder flexion P/ROM with report of pain. LUE Deficits / Details: General weakness.   Lower Extremity Assessment Lower Extremity Assessment: Defer to PT evaluation   Cervical / Trunk Assessment Cervical / Trunk Assessment: Normal   Communication Communication Communication: No difficulties   Cognition Arousal/Alertness: Awake/alert Behavior During Therapy: WFL for tasks assessed/performed Overall Cognitive Status: Within Functional Limits for tasks assessed                                                        Home Living Family/patient expects to be discharged to:: Private residence Living Arrangements: Children Available Help at Discharge: Family;Available PRN/intermittently Type of Home: House Home Access: Stairs to enter [employer] of Steps: 5 Entrance Stairs-Rails: Left;Right;Can reach both Home Layout: One level     Bathroom Shower/Tub: Occupational psychologist: Standard     Home Equipment: Cane - single point;Grab bars - tub/shower;Grab bars - toilet   Additional Comments: Pt reports living with step-daughter who works.      Prior Functioning/Environment Prior Level of Function : Needs assist       Physical Assist : Mobility (physical);ADLs (physical) Mobility (physical): Transfers;Gait   Mobility Comments: Pt reports household ambulation with cane use PRN. ADLs Comments: Pt is independent for ADL's and assisted by family for IADL's.        OT Problem List: Decreased strength;Decreased range of motion;Decreased activity tolerance;Impaired balance (sitting and/or standing)      OT  Treatment/Interventions: Self-care/ADL training;Therapeutic exercise;Therapeutic activities;Energy conservation;Patient/family education;Balance training    OT Goals(Current goals can be found in the care plan section) Acute Rehab OT Goals Patient Stated Goal: Increase strength and endurance. OT Goal Formulation: With patient Time For Goal Achievement: 11/07/21 Potential to Achieve Goals: Fair  OT Frequency: Min 1X/week    End of Session Equipment Utilized During Treatment: Other (comment) (cane) Nurse Communication: Other (comment) (Nurse present during part of session.)  Activity Tolerance: Patient tolerated treatment well Patient left: Other (comment) (In w/c being transported to a procedure.)  OT Visit Diagnosis: Unsteadiness on feet (R26.81);Muscle weakness (generalized) (M62.81);History of falling (Z91.81)                Time: 6629-4765 OT Time Calculation (min): 25 min Charges:  OT General Charges $OT Visit: 1 Visit OT Evaluation $OT Eval Low Complexity: 1 Low  Debroh Sieloff OT, MOT  Larey Seat 10/24/2021, 12:28 PM

## 2021-10-24 NOTE — Progress Notes (Signed)
Inpatient Diabetes Program Recommendations  AACE/ADA: New Consensus Statement on Inpatient Glycemic Control (2015)  Target Ranges:  Prepandial:   less than 140 mg/dL      Peak postprandial:   less than 180 mg/dL (1-2 hours)      Critically ill patients:  140 - 180 mg/dL   Lab Results  Component Value Date   GLUCAP 115 (H) 08/04/2021   HGBA1C 6.1 (H) 07/24/2021    Review of Glycemic Control Results for Lawrence Rogers, Lawrence Rogers" (MRN [id number]) as of 10/24/2021 10:31  Ref. Range 10/24/2021 05:17  Glucose Latest Ref Range: 70 - 99 mg/dL 47 (L)   Diabetes history: DM 2 Outpatient Diabetes medications: Amaryl 2 mg Daily, Metformin 1000 mg bid Current orders for Inpatient glycemic control:  None  Ensure Enlive bid between meals  Inpatient Diabetes Program Recommendations:    Hypoglycemia in labs. Pt has a history of Diabetes.  - check CBGs Q4 hours  -  start sliding scale if needed  Thanks,  Tama Headings RN, MSN, BC-ADM Inpatient Diabetes Coordinator Team Pager [phone] (8a-5p)

## 2021-10-24 NOTE — Plan of Care (Signed)
  Problem: Acute Rehab OT Goals (only OT should resolve) Goal: Pt. Will Perform Grooming Flowsheets (Taken 10/24/2021 1230) Pt Will Perform Grooming:  Independently  standing Goal: Pt. Will Perform Lower Body Dressing Flowsheets (Taken 10/24/2021 1230) Pt Will Perform Lower Body Dressing:  Independently  sit to/from stand Goal: Pt. Will Transfer To Toilet [place] (Taken 10/24/2021 1230) Pt Will Transfer to Toilet:  Independently  with modified independence  stand pivot transfer  ambulating Goal: Pt/Caregiver Will Perform Home Exercise Program Flowsheets (Taken 10/24/2021 1230) Pt/caregiver will Perform Home Exercise Program:  Increased ROM  Increased strength  Right Upper extremity  Left upper extremity  Independently  Kaleb Sek OT, MOT

## 2021-10-24 NOTE — Evaluation (Signed)
Physical Therapy Evaluation Patient Details Name: Lawrence Rogers MRN: [id number] DOB: 05-31-1961 Today's Date: 10/24/2021  History of Present Illness  [name] is a 60 y.o. male presents after fall, on the floor for several hours. CT abdomen and pelvis showed no posttraumatic deformities and signs of cirrhosis and bilateral kidney stones. 11/7 Lt sided thoracentesis successfully removed 3.6 L.  PMH: cirrhosis due to fatty liver disease, hypertension, and recent shoulder surgery 08/05/2019  Clinical Impression  Pt admitted with above diagnosis. Pt from home with step-daughter who works, independent and active with OP OT for rotator cuff repair. Pt's aunt present reports functional decline in pt over last month. Pt currently requiring min guard-min A with ambulation using RW for 16 ft, limited by fatigue and weakness complaints. Pt with multiple family members able to assist, unsure if full 24/7. Pt currently with functional limitations due to the deficits listed below (see PT Problem List). Pt will benefit from skilled PT to increase their independence and safety with mobility to allow discharge to the venue listed below.          Recommendations for follow up therapy are one component of a multi-disciplinary discharge planning process, led by the attending physician.  Recommendations may be updated based on patient status, additional functional criteria and insurance authorization.  Follow Up Recommendations Home health PT    Assistance Recommended at Discharge Frequent or constant Supervision/Assistance  Functional Status Assessment Patient has had a recent decline in their functional status and demonstrates the ability to make significant improvements in function in a reasonable and predictable amount of time.  Equipment Recommendations  Rolling walker (2 wheels)    Recommendations for Other Services       Precautions / Restrictions Precautions Precautions:  Fall Restrictions Weight Bearing Restrictions: No      Mobility  Bed Mobility Overal bed mobility: Modified Independent  General bed mobility comments: slow, labored movement    Transfers Overall transfer level: Needs assistance Equipment used: Rolling walker (2 wheels) Transfers: Sit to/from Stand Sit to Stand: Min guard;From elevated surface Stand pivot transfers: Supervision;Min guard    General transfer comment: min guard to steady with power to stand, reliant on BUE assisting to power up    Ambulation/Gait Ambulation/Gait assistance: Min guard;Min assist Gait Distance (Feet): 16 Feet Assistive device: Rolling walker (2 wheels) Gait Pattern/deviations: Step-through pattern;Decreased stride length Gait velocity: decreased  General Gait Details: slow, step through pattern with RW, slightly drifty but reports feeling "so weak", no overt LOB but slight generaly unsteadiness, minimal bil foot clearance and equal step length, limited by fatigue/endurance  Stairs            Wheelchair Mobility    Modified Rankin (Stroke Patients Only)       Balance Overall balance assessment: Needs assistance Sitting-balance support: No upper extremity supported;Feet supported Sitting balance-Leahy Scale: Good Sitting balance - Comments: seated EOB   Standing balance support: Reliant on assistive device for balance;Bilateral upper extremity supported;During functional activity Standing balance-Leahy Scale: Poor Standing balance comment: fair to good with cane       Pertinent Vitals/Pain Pain Assessment: Faces Pain Score: 3  Faces Pain Scale: Hurts little more Pain Location: low back while supine Pain Descriptors / Indicators: Aching;Sore Pain Intervention(s): Limited activity within patient's tolerance;Monitored during session;Repositioned    Home Living Family/patient expects to be discharged to:: Private residence Living Arrangements: Children Available Help at  Discharge: Family;Available PRN/intermittently Type of Home: House Home Access: Stairs to enter Entrance  Stairs-Rails: Left;Right;Can reach both Entrance Stairs-Number of Steps: 5   Home Layout: One level Home Equipment: Cane - single point;Grab bars - tub/shower;Grab bars - toilet Additional Comments: Pt reports living with step-daughter who works.    Prior Function Prior Level of Function : Needs assist       Physical Assist : Mobility (physical);ADLs (physical) Mobility (physical): Transfers;Gait   Mobility Comments: Pt reports household ambulation with cane use PRN. ADLs Comments: Pt is independent for ADL's and assisted by family for IADL's. Pt active with OPPT for rotator cuff repair rehab.     Hand Dominance   Dominant Hand: Right    Extremity/Trunk Assessment   Upper Extremity Assessment Upper Extremity Assessment: Defer to OT evaluation  Lower Extremity Assessment Lower Extremity Assessment: Generalized weakness (AROM WNL, strength 3+/5, denies numbness/tingling, noticeable edema with socks marking into mid calf and ankles so removed once seated with BLE elevated)    Cervical / Trunk Assessment Cervical / Trunk Assessment: Normal  Communication   Communication: No difficulties  Cognition Arousal/Alertness: Awake/alert Behavior During Therapy: WFL for tasks assessed/performed Overall Cognitive Status: Within Functional Limits for tasks assessed       General Comments      Exercises     Assessment/Plan    PT Assessment Patient needs continued PT services  PT Problem List Decreased strength;Decreased activity tolerance;Decreased balance;Decreased skin integrity       PT Treatment Interventions DME instruction;Gait training;Stair training;Functional mobility training;Therapeutic activities;Therapeutic exercise;Balance training;Patient/family education    PT Goals (Current goals can be found in the Care Plan section)  Acute Rehab PT Goals Patient Stated  Goal: regain strength and independence PT Goal Formulation: With patient Time For Goal Achievement: 11/07/21 Potential to Achieve Goals: Good    Frequency Min 3X/week   Barriers to discharge        Co-evaluation               AM-PAC PT "6 Clicks" Mobility  Outcome Measure Help needed turning from your back to your side while in a flat bed without using bedrails?: None Help needed moving from lying on your back to sitting on the side of a flat bed without using bedrails?: None Help needed moving to and from a bed to a chair (including a wheelchair)?: A Little Help needed standing up from a chair using your arms (e.g., wheelchair or bedside chair)?: A Little Help needed to walk in hospital room?: A Little Help needed climbing 3-5 steps with a railing? : A Lot 6 Click Score: 19    End of Session Equipment Utilized During Treatment: Gait belt Activity Tolerance: Patient tolerated treatment well;Patient limited by fatigue Patient left: in chair;with call bell/phone within reach Nurse Communication: Mobility status PT Visit Diagnosis: Unsteadiness on feet (R26.81);Other abnormalities of gait and mobility (R26.89);Muscle weakness (generalized) (M62.81)    Time: 6948-5462 PT Time Calculation (min) (ACUTE ONLY): 19 min   Charges:   PT Evaluation $PT Eval Low Complexity: 1 Low           Tori Boston Cookson PT, DPT 10/24/21, 1:04 PM

## 2021-10-25 ENCOUNTER — Encounter (INDEPENDENT_AMBULATORY_CARE_PROVIDER_SITE_OTHER): Payer: Self-pay | Admitting: *Deleted

## 2021-10-25 ENCOUNTER — Encounter (HOSPITAL_COMMUNITY)
Admission: EM | Disposition: A | Discharge status: Home Health | Payer: Self-pay | Source: Home / Self Care | Attending: Family Medicine

## 2021-10-25 ENCOUNTER — Inpatient Hospital Stay (HOSPITAL_COMMUNITY): Payer: Managed Care, Other (non HMO) | Admitting: Anesthesiology

## 2021-10-25 ENCOUNTER — Encounter (HOSPITAL_COMMUNITY): Payer: Managed Care, Other (non HMO)

## 2021-10-25 ENCOUNTER — Encounter (HOSPITAL_COMMUNITY): Payer: Self-pay | Admitting: Internal Medicine

## 2021-10-25 DIAGNOSIS — R609 Edema, unspecified: Secondary | ICD-10-CM

## 2021-10-25 DIAGNOSIS — Z515 Encounter for palliative care: Secondary | ICD-10-CM

## 2021-10-25 DIAGNOSIS — I8500 Esophageal varices without bleeding: Secondary | ICD-10-CM

## 2021-10-25 DIAGNOSIS — K317 Polyp of stomach and duodenum: Secondary | ICD-10-CM

## 2021-10-25 DIAGNOSIS — Z7189 Other specified counseling: Secondary | ICD-10-CM

## 2021-10-25 DIAGNOSIS — K652 Spontaneous bacterial peritonitis: Secondary | ICD-10-CM

## 2021-10-25 DIAGNOSIS — E7220 Disorder of urea cycle metabolism, unspecified: Secondary | ICD-10-CM

## 2021-10-25 DIAGNOSIS — K766 Portal hypertension: Secondary | ICD-10-CM

## 2021-10-25 DIAGNOSIS — C220 Liver cell carcinoma: Secondary | ICD-10-CM | POA: Diagnosis not present

## 2021-10-25 DIAGNOSIS — D72823 Leukemoid reaction: Secondary | ICD-10-CM | POA: Diagnosis not present

## 2021-10-25 DIAGNOSIS — K7460 Unspecified cirrhosis of liver: Principal | ICD-10-CM

## 2021-10-25 DIAGNOSIS — K3189 Other diseases of stomach and duodenum: Secondary | ICD-10-CM

## 2021-10-25 HISTORY — PX: ESOPHAGEAL BANDING: SHX5518

## 2021-10-25 HISTORY — PX: ESOPHAGOGASTRODUODENOSCOPY (EGD) WITH PROPOFOL: SHX5813

## 2021-10-25 LAB — COMPREHENSIVE METABOLIC PANEL
ALT: 33 U/L (ref 0–44)
AST: 76 U/L — ABNORMAL HIGH (ref 15–41)
Albumin: 2.9 g/dL — ABNORMAL LOW (ref 3.5–5.0)
Alkaline Phosphatase: 418 U/L — ABNORMAL HIGH (ref 38–126)
Anion gap: 13 (ref 5–15)
BUN: 32 mg/dL — ABNORMAL HIGH (ref 6–20)
CO2: 23 mmol/L (ref 22–32)
Calcium: 9.2 mg/dL (ref 8.9–10.3)
Chloride: 95 mmol/L — ABNORMAL LOW (ref 98–111)
Creatinine, Ser: 0.94 mg/dL (ref 0.61–1.24)
GFR, Estimated: >60 mL/min (ref >60)
Glucose, Bld: 50 mg/dL — ABNORMAL LOW (ref 70–99)
Potassium: 3.3 mmol/L — ABNORMAL LOW (ref 3.5–5.1)
Sodium: 131 mmol/L — ABNORMAL LOW (ref 135–145)
Total Bilirubin: 6.2 mg/dL — ABNORMAL HIGH (ref 0.3–1.2)
Total Protein: 5.2 g/dL — ABNORMAL LOW (ref 6.5–8.1)

## 2021-10-25 LAB — CBC WITH DIFFERENTIAL/PLATELET
Band Neutrophils: 3 %
Basophils Absolute: 0 10*3/uL (ref 0.0–0.1)
Basophils Relative: 0 %
Eosinophils Absolute: 0.9 10*3/uL — ABNORMAL HIGH (ref 0.0–0.5)
Eosinophils Relative: 1 %
HCT: 27 % — ABNORMAL LOW (ref 39.0–52.0)
Hemoglobin: 9.2 g/dL — ABNORMAL LOW (ref 13.0–17.0)
Lymphocytes Relative: 4 %
Lymphs Abs: 3.5 10*3/uL (ref 0.7–4.0)
MCH: 29.7 pg (ref 26.0–34.0)
MCHC: 34.1 g/dL (ref 30.0–36.0)
MCV: 87.1 fL (ref 80.0–100.0)
Metamyelocytes Relative: 2 %
Monocytes Absolute: 0 10*3/uL — ABNORMAL LOW (ref 0.1–1.0)
Monocytes Relative: 0 %
Myelocytes: 2 %
Neutro Abs: 78.8 10*3/uL — ABNORMAL HIGH (ref 1.7–7.7)
Neutrophils Relative %: 88 %
Platelets: 76 10*3/uL — ABNORMAL LOW (ref 150–400)
RBC: 3.1 MIL/uL — ABNORMAL LOW (ref 4.22–5.81)
RDW: 20.9 % — ABNORMAL HIGH (ref 11.5–15.5)
WBC: 86.6 10*3/uL (ref 4.0–10.5)
nRBC: 0 % (ref 0.0–0.2)

## 2021-10-25 LAB — GLUCOSE, CAPILLARY
Glucose-Capillary: 125 mg/dL — ABNORMAL HIGH (ref 70–99)
Glucose-Capillary: 142 mg/dL — ABNORMAL HIGH (ref 70–99)
Glucose-Capillary: 69 mg/dL — ABNORMAL LOW (ref 70–99)
Glucose-Capillary: 71 mg/dL (ref 70–99)
Glucose-Capillary: 76 mg/dL (ref 70–99)
Glucose-Capillary: 78 mg/dL (ref 70–99)
Glucose-Capillary: 84 mg/dL (ref 70–99)
Glucose-Capillary: 92 mg/dL (ref 70–99)

## 2021-10-25 LAB — PROTIME-INR
INR: 1.7 — ABNORMAL HIGH (ref 0.8–1.2)
Prothrombin Time: 20 seconds — ABNORMAL HIGH (ref 11.4–15.2)

## 2021-10-25 LAB — MAGNESIUM: Magnesium: 2.4 mg/dL (ref 1.7–2.4)

## 2021-10-25 LAB — AMMONIA: Ammonia: 55 umol/L — ABNORMAL HIGH (ref 9–35)

## 2021-10-25 LAB — PREPARE RBC (CROSSMATCH)

## 2021-10-25 LAB — PROCALCITONIN: Procalcitonin: 4.22 ng/mL

## 2021-10-25 LAB — PATHOLOGIST SMEAR REVIEW

## 2021-10-25 LAB — CYTOLOGY - NON PAP

## 2021-10-25 LAB — LACTIC ACID, PLASMA: Lactic Acid, Venous: 2.7 mmol/L (ref 0.5–1.9)

## 2021-10-25 SURGERY — ESOPHAGOGASTRODUODENOSCOPY (EGD) WITH PROPOFOL
Anesthesia: General

## 2021-10-25 MED ORDER — FENTANYL CITRATE (PF) 100 MCG/2ML IJ SOLN
50.0000 ug | INTRAMUSCULAR | Status: DC | PRN
  Administered 2021-10-25: 50 ug via INTRAVENOUS
  Filled 2021-10-25: qty 2

## 2021-10-25 MED ORDER — DEXTROSE 50 % IV SOLN: 50.0000 mL | Freq: Once | INTRAVENOUS | Status: AC

## 2021-10-25 MED ORDER — DEXTROSE 50 % IV SOLN
INTRAVENOUS | Status: AC
  Administered 2021-10-25: 50 mL via INTRAVENOUS
  Filled 2021-10-25: qty 50

## 2021-10-25 MED ORDER — PROPOFOL 10 MG/ML IV BOLUS
INTRAVENOUS | Status: DC | PRN
  Administered 2021-10-25: 30 mg via INTRAVENOUS
  Administered 2021-10-25: 20 mg via INTRAVENOUS
  Administered 2021-10-25: 80 mg via INTRAVENOUS

## 2021-10-25 MED ORDER — DEXTROSE 10 % IV SOLN: INTRAVENOUS | Status: DC

## 2021-10-25 MED ORDER — LIDOCAINE HCL (CARDIAC) PF 100 MG/5ML IV SOSY
PREFILLED_SYRINGE | INTRAVENOUS | Status: DC | PRN
  Administered 2021-10-25: 50 mg via INTRAVENOUS

## 2021-10-25 MED ORDER — SODIUM CHLORIDE 0.9 % IV SOLN: INTRAVENOUS | Status: DC

## 2021-10-25 MED ORDER — SODIUM CHLORIDE 0.9% IV SOLUTION: Freq: Once | INTRAVENOUS | Status: DC

## 2021-10-25 MED ORDER — LACTATED RINGERS IV SOLN: INTRAVENOUS | Status: DC

## 2021-10-25 MED ORDER — PROPOFOL 500 MG/50ML IV EMUL
INTRAVENOUS | Status: DC | PRN
  Administered 2021-10-25: 125 ug/kg/min via INTRAVENOUS

## 2021-10-25 NOTE — Progress Notes (Signed)
PROGRESS NOTE   Lawrence Rogers  UTM:[id number] DOB: 1961-06-25 DOA: 10/23/2021 PCP: [name], MD   No chief complaint on file.   Level of care: Telemetry  Brief Admission History:  Lawrence Rogers is a 60 y.o. 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  At presentation to ED, patient reported weakness, denies syncope, loss of consciousness or dizziness. CT abdomen and pelvis showed no posttraumatic deformities. Patient was fluid overloaded with ascites.  Labs at that time showed a leukocytosis 137.1, concerning for malignancy.  Patient MRI was concerning for hepatocellular carcinoma.  Both GI and oncology has been consulted.  Patient is status post large-volume paracentesis of 3.6 L on 11/8. Fluid analysis consistent with SBP, gram stain with gram-positive cocci in clusters; Continue IV antibiotics.  Imaging for falls showed posttraumatic injuries.  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  Subsequent MRI showed 9.5 cm infiltrating enhancing mass.  Patient is scheduled for EGD for varices today.  Palliative care will be consulted for patient.  Assessment & Plan:   Active Problems:   Cirrhosis of liver with ascites (HCC)   Ascites   SBP (spontaneous bacterial peritonitis) (HCC)   Liver mass   Hyperammonemia (HCC)   Peripheral edema   Protein-calorie malnutrition, severe   Leukemoid reaction   Decompensated cirrhosis with SBP -Significant volume overload with noted noncompliance with diuretics at presentation. -S/p paracentesis on 11/7, per GI performed with over 3 L removed. -fluid analysis consistent with SBP (355 PMNs); gram stain with gram-positive cocci in clusters. -Continue broad-spectrum antibiotics -Continue IV albumin. -Continue to hold diuretics for now given some AKI as well  as lactic acidosis; although both are improving. -Doppler ultrasound of liver pending -Noted to have prior EGD in 2020 with varices which were banded. -11/19 patient will undergo EGD for varices. 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  Subsequent MRI showed 9.5 cm infiltrating enhancing mass.  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  Oncology is following.   Transaminitis secondary to above -MRI concerning for hepatocellular carcinoma -Labs stable/slightly elevated AST 65>>76, alk phos 386>>418, total bilirubin 5.4>>6.2    Progressive weakness/debility with fall-multifactorial -No acute injuries noted -PT/OT evaluation -Fall precautions   Leukocytosis -Oncology has been consulted -Per oncology, As his white count was normal in August, unlikely to be any hematological malignancy.  -WBC downtrending; 137.1>> 100.3>>86.6 -Procalcitonin downtrending; 7.26>> 4.23>>4.22 -Per oncology no further work-up for hematological malignancy    Lactic acidosis -Likely in the setting of hematological malignancy and not likely sepsis physiology -Stable lactic acid 2.4>>2.7   Hyponatremia/AKI -Likely in the setting of prerenal causes with suspicion of hepatorenal syndrome -IV albumin as ordered per GI -Continue to monitor strict I's and O's along with daily weights -Avoid nephrotoxic agents and diuretics currently withheld -Sodium 131.  Secondary to volume overload.  We will continue to monitor.  DVT prophylaxis: SCD Code Status: Code Family Communication: Patient stepdaughter Disposition:  Status is: Inpatient  Remains inpatient appropriate because: EGD today and further treatment of SBP.      Consultants:  GI and oncology  Procedures:  Large-volume paracentesis  Antimicrobials:  Cefepime Vancomycin  Subjective: Patient alert and oriented x3.  Patient states that he continues to feel  weak, but otherwise he feels okay.  Patient is aware of EGD today for dialysis.  Patient continues to endorse pain in left hip.  Denies headache, chest pain, shortness of breath, or palpitations.  Objective: Vitals:   10/24/21 2356 10/25/21 0328 10/25/21 0500 10/25/21 0816  BP: 117/64 (!) 108/58  108/60  Pulse: 93 92  90  Resp: 19 19  20   Temp: 98.2 F (36.8 C) 98.2 F (36.8 C)  98.3 F (36.8 C)  TempSrc: Oral   Oral  SpO2:  94%  95%  Weight: 95.2 kg  95.2 kg   Height: 6' 2"  (1.88 m)       Intake/Output Summary (Last 24 hours) at 10/25/2021 0940 Last data filed at 10/24/2021 1817 Gross per 24 hour  Intake 600 ml  Output 400 ml  Net 200 ml    Filed Weights   10/24/21 0500 10/24/21 2356 10/25/21 0500  Weight: 95.2 kg 95.2 kg 95.2 kg    Examination:  General exam: Appears calm and comfortable  Respiratory system: Clear to auscultation. Respiratory effort normal. Cardiovascular system: normal S1 & S2 heard. No JVD, murmurs, rubs, gallops or clicks. No pedal edema. Gastrointestinal system: Abdomen is nondistended, soft and nontender. No organomegaly or masses felt. Normal bowel sounds heard. Central nervous system: Alert and oriented. No focal neurological deficits. Skin: No rashes, lesions or ulcers Psychiatry: Judgement and insight appear normal. Mood & affect appropriate.   Data Reviewed: I have personally reviewed following labs and imaging studies  CBC: Recent Labs  Lab 10/23/21 1044 10/24/21 0517 10/25/21 0506  WBC 137.1* 100.3* 86.6*  NEUTROABS 128.9*  --  78.8*  HGB 11.6* 9.4* 9.2*  HCT 36.2* 28.9* 27.0*  MCV 89.8 89.8 87.1  PLT 111* 76* 76*     Basic Metabolic Panel: Recent Labs  Lab 10/23/21 1044 10/24/21 0517 10/25/21 0506  NA 132* 129* 131*  K 4.1 3.9 3.3*  CL 93* 96* 95*  CO2 22 26 23   GLUCOSE 85 47* 50*  BUN 35* 37* 32*  CREATININE 1.39* 1.18 0.94  CALCIUM 9.5 8.6* 9.2  MG  --  2.2 2.4     GFR: Estimated Creatinine Clearance: 98.4  mL/min (by C-G formula based on SCr of 0.94 mg/dL).  Liver Function Tests: Recent Labs  Lab 10/23/21 1044 10/24/21 0517 10/25/21 0506  AST 77* 65* 76*  ALT 37 29 33  ALKPHOS 577* 386* 418*  BILITOT 5.5* 5.1*  5.4* 6.2*  PROT 5.4* 4.7* 5.2*  ALBUMIN 2.2* 2.3* 2.9*     CBG: Recent Labs  Lab 10/24/21 1609 10/24/21 2008 10/25/21 0010 10/25/21 0401 10/25/21 0746  GLUCAP 73 106* 71 84 76    Recent Results (from the past 240 hour(s))  Culture, blood (single)     Status: None (Preliminary result)   Collection Time: 10/23/21 12:52 PM   Specimen: Right Antecubital; Blood  Result Value Ref Range Status   Specimen Description   Final    RIGHT ANTECUBITAL Performed at [facility], [address] [place], [place] [address]    Special Requests   Final    BOTTLES DRAWN AEROBIC AND ANAEROBIC Blood Culture adequate volume Performed at [facility], [address] [place], [place] [address]    Culture  Setup Time   Final    AEB AND ANA BOTTLES Gram Stain Report Called to,Read Back By and Verified With: Coralie Common RN [phone] K FORSYTH CORRECTED RESULTS NO ORGANISMS SEEN PREVIOUSLY REPORTED AS: GRAM POSITIVE COCCI IN CLUSTERS CORRECTED RESULTS CALLED TO: PHARMD S.HURTH AT 2694  ON 10/25/2021 BY T.SAAD.    Culture   Final    CULTURE REINCUBATED FOR BETTER GROWTH Performed at [facility], [place] [address], [place], [place] [address]    Report Status PENDING  Incomplete  Blood Culture ID Panel (Reflexed)     Status: None   Collection Time: 10/23/21 12:52 PM  Result Value Ref Range Status   Enterococcus faecalis NOT DETECTED NOT DETECTED Final   Enterococcus Faecium NOT DETECTED NOT DETECTED Final   Listeria monocytogenes NOT DETECTED NOT DETECTED Final   Staphylococcus species NOT DETECTED NOT DETECTED Final   Staphylococcus aureus (BCID) NOT DETECTED NOT DETECTED Final   Staphylococcus epidermidis NOT DETECTED NOT DETECTED Final   Staphylococcus lugdunensis NOT DETECTED  NOT DETECTED Final   Streptococcus species NOT DETECTED NOT DETECTED Final   Streptococcus agalactiae NOT DETECTED NOT DETECTED Final   Streptococcus pneumoniae NOT DETECTED NOT DETECTED Final   Streptococcus pyogenes NOT DETECTED NOT DETECTED Final   A.calcoaceticus-baumannii NOT DETECTED NOT DETECTED Final   Bacteroides fragilis NOT DETECTED NOT DETECTED Final   Enterobacterales NOT DETECTED NOT DETECTED Final   Enterobacter cloacae complex NOT DETECTED NOT DETECTED Final   Escherichia coli NOT DETECTED NOT DETECTED Final   Klebsiella aerogenes NOT DETECTED NOT DETECTED Final   Klebsiella oxytoca NOT DETECTED NOT DETECTED Final   Klebsiella pneumoniae NOT DETECTED NOT DETECTED Final   Proteus species NOT DETECTED NOT DETECTED Final   Salmonella species NOT DETECTED NOT DETECTED Final   Serratia marcescens NOT DETECTED NOT DETECTED Final   Haemophilus influenzae NOT DETECTED NOT DETECTED Final   Neisseria meningitidis NOT DETECTED NOT DETECTED Final   Pseudomonas aeruginosa NOT DETECTED NOT DETECTED Final   Stenotrophomonas maltophilia NOT DETECTED NOT DETECTED Final   Candida albicans NOT DETECTED NOT DETECTED Final   Candida auris NOT DETECTED NOT DETECTED Final   Candida glabrata NOT DETECTED NOT DETECTED Final   Candida krusei NOT DETECTED NOT DETECTED Final   Candida parapsilosis NOT DETECTED NOT DETECTED Final   Candida tropicalis NOT DETECTED NOT DETECTED Final   Cryptococcus neoformans/gattii NOT DETECTED NOT DETECTED Final    Comment: Performed at [facility], [address] [place], [place] [address]  Culture, blood (single)     Status: None (Preliminary result)   Collection Time: 10/23/21 12:53 PM   Specimen: BLOOD LEFT HAND  Result Value Ref Range Status   Specimen Description BLOOD LEFT HAND  Final   Special Requests   Final    BOTTLES DRAWN AEROBIC AND ANAEROBIC Blood Culture results may not be optimal due to an inadequate volume of blood received in culture  bottles   Culture   Final    NO GROWTH 2 DAYS Performed at [facility], [address] [place], [place] [address]    Report Status PENDING  Incomplete  Acid Fast Smear (AFB)     Status: None   Collection Time: 10/23/21  2:05 PM   Specimen: Peritoneal Cavity; Body Fluid  Result Value Ref Range Status   AFB Specimen Processing Concentration  Final   Acid Fast Smear Negative  Final    Comment: (NOTE) Performed At: [facility] [place], [place] [id number] Rush Farmer MD TT:[id number]    Source (AFB) PERITONEAL  Final    Comment: Performed at [facility], [address], [place], [place] [address]  Culture, body fluid w Gram Stain-bottle     Status: None (Preliminary result)   Collection Time: 10/23/21  2:05 PM  Specimen: Peritoneal Washings  Result Value Ref Range Status   Specimen Description PERITONEAL  Final   Special Requests   Final    BOTTLES DRAWN AEROBIC AND ANAEROBIC Blood Culture adequate volume   Culture   Final    NO GROWTH 2 DAYS Performed at [facility], [address] [place], [place] [address]    Report Status PENDING  Incomplete  Gram stain     Status: None   Collection Time: 10/23/21  2:05 PM   Specimen: Peritoneal Washings  Result Value Ref Range Status   Specimen Description PERITONEAL  Final   Special Requests NONE  Final   Gram Stain   Final    GRAM POSITIVE COCCI IN CLUSTERS CYTOSPIN SMEAR Gram Stain Report Called to,Read Back By and Verified With: Coralie Common RN [phone] K FORSYTH Performed at [facility], [address], [place], [place] [address]    Report Status 10/23/2021 FINAL  Final  Resp Panel by RT-PCR (Flu A&B, Covid) Nasopharyngeal Swab     Status: None   Collection Time: 10/23/21  2:10 PM   Specimen: Nasopharyngeal Swab; Nasopharyngeal(NP) swabs in vial transport medium  Result Value Ref Range Status   SARS Coronavirus 2 by RT PCR NEGATIVE NEGATIVE Final    Comment: (NOTE) SARS-CoV-2 target nucleic acids are  NOT DETECTED.  The SARS-CoV-2 RNA is generally detectable in upper respiratory specimens during the acute phase of infection. The lowest concentration of SARS-CoV-2 viral copies this assay can detect is 138 copies/mL. A negative result does not preclude SARS-Cov-2 infection and should not be used as the sole basis for treatment or other patient management decisions. A negative result may occur with  improper specimen collection/handling, submission of specimen other than nasopharyngeal swab, presence of viral mutation(s) within the areas targeted by this assay, and inadequate number of viral copies(<138 copies/mL). A negative result must be combined with clinical observations, patient history, and epidemiological information. The expected result is Negative.  Fact Sheet for Patients:  EntrepreneurPulse.com.au  Fact Sheet for Healthcare Providers:  IncredibleEmployment.be  This test is no t yet approved or cleared by the [place] FDA and  has been authorized for detection and/or diagnosis of SARS-CoV-2 by FDA under an Emergency Use Authorization (EUA). This EUA will remain  in effect (meaning this test can be used) for the duration of the COVID-19 declaration under Section 564(b)(1) of the Act, 21 U.S.C.section 360bbb-3(b)(1), unless the authorization is terminated  or revoked sooner.       Influenza A by PCR NEGATIVE NEGATIVE Final   Influenza B by PCR NEGATIVE NEGATIVE Final    Comment: (NOTE) The Xpert Xpress SARS-CoV-2/FLU/RSV plus assay is intended as an aid in the diagnosis of influenza from Nasopharyngeal swab specimens and should not be used as a sole basis for treatment. Nasal washings and aspirates are unacceptable for Xpert Xpress SARS-CoV-2/FLU/RSV testing.  Fact Sheet for Patients: EntrepreneurPulse.com.au  Fact Sheet for Healthcare Providers: IncredibleEmployment.be  This test is not  yet approved or cleared by the [place] FDA and has been authorized for detection and/or diagnosis of SARS-CoV-2 by FDA under an Emergency Use Authorization (EUA). This EUA will remain in effect (meaning this test can be used) for the duration of the COVID-19 declaration under Section 564(b)(1) of the Act, 21 U.S.C. section 360bbb-3(b)(1), unless the authorization is terminated or revoked.  Performed at [facility], [address], [place], [place] [address]   MRSA Next Gen by PCR, Nasal     Status:  None   Collection Time: 10/23/21  6:17 PM   Specimen: Nasal Mucosa; Nasal Swab  Result Value Ref Range Status   MRSA by PCR Next Gen NOT DETECTED NOT DETECTED Final    Comment: (NOTE) The GeneXpert MRSA Assay (FDA approved for NASAL specimens only), is one component of a comprehensive MRSA colonization surveillance program. It is not intended to diagnose MRSA infection nor to guide or monitor treatment for MRSA infections. Test performance is not FDA approved in patients less than 79 years old. Performed at [facility], [address] [place], [place] [address]       Radiology Studies: CT ABDOMEN PELVIS WO CONTRAST  Result Date: 10/23/2021 CLINICAL DATA:  Abdominal distension. Fall. Low back pain. Cirrhosis. Diabetes. EXAM: CT ABDOMEN AND PELVIS WITHOUT CONTRAST TECHNIQUE: Multidetector CT imaging of the abdomen and pelvis was performed following the standard protocol without IV contrast. COMPARISON:  05/20/2020 abdominal ultrasound. 10/07/2018 abdominopelvic CT. FINDINGS: Lower chest: right greater than left dependent lower lobe airspace disease. A 3 mm lingular nodule on 06/05 is not readily apparent on the prior. Small right pleural effusion is new. Normal heart size. Bilateral gynecomastia. Marked periesophageal varices. Hepatobiliary: Subtle hypoattenuating right hepatic lobe mass including at 8.2 x 8.8 cm, suboptimally evaluated on this noncontrast CT. Example 19/3. Likely  separate more inferior right hepatic lobe hypoattenuating lesion of 2.2 cm on 30/3. Normal gallbladder, without biliary ductal dilatation. Pancreas: Pancreatic atrophy, without duct dilatation. Spleen: Splenomegaly, including at 18.4 cm craniocaudal. Adrenals/Urinary Tract: Normal adrenal glands. Bilateral renal collecting system calculi, maximally 1.0 cm in the interpolar left kidney. No hydronephrosis. No hydroureter or ureteric calculi. No bladder calculi. Stomach/Bowel: The gastric body is underdistended but appears thick walled on 22/3. The colon is underdistended and poorly evaluated. Normal small bowel caliber. Vascular/Lymphatic: Aortic atherosclerosis. Extensive perigastric varices. Small abdominal retroperitoneal nodes are likely reactive in the setting of cirrhosis. Index precaval 10 mm node on 38/3 is similar. No pelvic sidewall adenopathy. Reproductive: Normal prostate. Other: Moderate volume abdominopelvic ascites. No free intraperitoneal air. Musculoskeletal: No acute osseous abnormality. IMPRESSION: 1. No posttraumatic deformity identified. 2. Cirrhosis and portal venous hypertension with moderate volume ascites. Hypoattenuating liver lesions, suspicious for multifocal hepatocellular carcinoma. When the patient is clinically stable and able to follow directions and hold their breath (preferably as an outpatient) further evaluation with dedicated abdominal MRI should be considered. 3. Right pleural effusion with bibasilar airspace disease. Most likely atelectasis. At the right lung base, infection cannot be excluded. 4. Bilateral nephrolithiasis. 5. Gastric wall thickening in the setting of underdistention. Recommend clinical exclusion of gastritis. Hypoalbuminemia could create a similar appearance. 6. 3 mm lingular nodule. No follow-up needed if patient is low-risk. Non-contrast chest CT can be considered in 12 months if patient is high-risk. This recommendation follows the consensus statement:  Guidelines for Management of Incidental Pulmonary Nodules Detected on CT Images: From the Fleischner Society 2017; Radiology 2017; 284:228-243. 7. Bilateral gynecomastia. These results were called by telephone at the time of interpretation on 10/23/2021 at 12:37 pm to provider [facility] ZAMMIT , who verbally acknowledged these results. Electronically Signed   By: Abigail Miyamoto M.D.   On: 10/23/2021 12:38   MR LIVER W WO CONTRAST  Result Date: 10/24/2021 CLINICAL DATA:  Cirrhosis, new liver lesions on CT, occlusion of main and intrahepatic portal veins EXAM: MRI ABDOMEN WITHOUT AND WITH CONTRAST TECHNIQUE: Multiplanar multisequence MR imaging of the abdomen was performed both before and after the administration of intravenous contrast. CONTRAST:  84m  GADAVIST GADOBUTROL 1 MMOL/ML IV SOLN COMPARISON:  CT abdomen/pelvis dated 10/23/2021 FINDINGS: Motion degraded images. Lower chest: Moderate right and small left pleural effusions. Associated bilateral lower lobe opacities, likely atelectasis. Hepatobiliary: Cirrhosis. 9.5 cm infiltrating enhancing mass in the posterior right hepatic dome, centered in segment 7 (series 6/image 15), compatible with hepatocellular carcinoma (LI-RADS 5). Additional 2.1 cm enhancing lesion in segment 5 (series 14/image 51), also compatible with HCC (LI-RADS 5). Gallbladder is underdistended with mild gallbladder wall thickening/edema, likely secondary to venous congestion/ascites. No cholelithiasis. No intrahepatic or extrahepatic ductal dilatation. Pancreas:  Within normal limits. Spleen:  Enlarged, measuring 20.2 cm in craniocaudal dimension. Adrenals/Urinary Tract:  Adrenal glands are within normal limits. 1.6 cm cyst with layering hemorrhage in the posterior right upper kidney (series 4/image 30), benign (Bosniak II). Left kidney is within normal limits. No hydronephrosis. Stomach/Bowel: Stomach is within normal limits. Visualized bowel is grossly unremarkable. Vascular/Lymphatic:  No  evidence of abdominal aortic aneurysm. Main portal vein thrombosis (series 18/image 45), extending into the left and right portal veins (series 201/images 34 and 42). No associated findings to suggest tumor thrombus. Gastroesophageal varices. Other: Small to moderate abdominal ascites, predominantly perihepatic. Musculoskeletal: No focal osseous lesions. 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 No associated findings to suggest tumor thrombus. Cirrhosis. Splenomegaly. Gastroesophageal varices. Small to moderate abdominal ascites. Moderate right and small left pleural effusions. Electronically Signed   By: Julian Hy M.D.   On: 10/24/2021 20:27   US Paracentesis  Result Date: 10/23/2021 INDICATION: Cirrhosis, ascites EXAM: ULTRASOUND GUIDED DIAGNOSTIC AND THERAPEUTIC PARACENTESIS MEDICATIONS: None. COMPLICATIONS: None immediate. PROCEDURE: Informed written consent was obtained from the patient after a discussion of the risks, benefits and alternatives to treatment. A timeout was performed prior to the initiation of the procedure. Initial ultrasound scanning demonstrates a large amount of ascites within the LEFT lower abdominal quadrant. The right lower abdomen was prepped and draped in the usual sterile fashion. 1% lidocaine was used for local anesthesia. Following this, a 5 [place] Yueh catheter was introduced. An ultrasound image was saved for documentation purposes. The paracentesis was performed. The catheter was removed and a dressing was applied. The patient tolerated the procedure well without immediate post procedural complication. FINDINGS: A total of approximately 3.6 L of yellow ascitic fluid was removed. Samples were sent to the laboratory as requested by the clinical team. IMPRESSION: Successful ultrasound-guided paracentesis yielding 3.6 liters of  peritoneal fluid. Electronically Signed   By: Lavonia Dana M.D.   On: 10/23/2021 15:00   DG Chest Port 1 View  Result Date: 10/23/2021 CLINICAL DATA:  Weakness EXAM: PORTABLE CHEST 1 VIEW COMPARISON:  Chest x-ray 09/22/2021 FINDINGS: Heart size is upper normal. Mediastinum appears stable. Mild calcified plaques in the aortic arch. Mild pulmonary vascular/interstitial prominence. Small left and moderate right pleural effusion with associated atelectasis/infiltrate. No pneumothorax. IMPRESSION: Bilateral pleural effusions right greater than left. Mild pulmonary vascular prominence. Electronically Signed   By: Ofilia Neas M.D.   On: 10/23/2021 11:56   US LIVER DOPPLER  Result Date: 10/24/2021 CLINICAL DATA:  60 year old male with a history of hepatic cirrhosis and recent imaging findings concerning for hepatocellular carcinoma and portal vein occlusion. EXAM: DUPLEX ULTRASOUND OF LIVER TECHNIQUE: Color and duplex Doppler ultrasound was performed to evaluate the hepatic in-flow and out-flow vessels. COMPARISON:  CT abdomen/pelvis 10/23/2021 FINDINGS: Liver: Markedly nodular hepatic contour. Diffuse heterogeneity  of the hepatic parenchyma. Discrete hepatic lesions are difficult to visualize given the heterogeneity of the background parenchyma. No biliary ductal dilatation. 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 Heterogeneously echogenic material is visualized within the vessel lumen. Hepatic Vein Velocities Right:  42 cm/sec Middle:  45 cm/sec Left:  43 cm/sec IVC: Questionable thrombus visualized within the IVC. Hepatic Artery Velocity:  46 cm/sec Splenic Vein Velocity: Occluded Spleen: 18.1 cm x 7.3 cm x 19.6 cm with a total volume of 1,352 cm^3 (411 cm^3 is upper limit normal) Portal Vein Occlusion/Thrombus: Yes Splenic Vein Occlusion/Thrombus: Yes Ascites: Present Varices: Present IMPRESSION: 1. 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  It is unclear if this represents bland thrombus or tumor thrombus. 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 2. Questionable thrombus within the inferior vena cava. If truly representative of thrombus, this would almost certainly be tumor thrombus extending through the hepatic veins into the IVC. 3. Small cirrhotic liver with heavily nodular contour. 4. Although suspected on CT imaging, discrete hepatic mass and lesions not visualized likely due to the severe heterogeneity of the background parenchyma. 5. Ascites. Recommend further evaluation with gadolinium-enhanced MRI of the abdomen to assess for hepatocellular carcinoma and tumor thrombus within the IVC and portal veins. Electronically Signed   By: Jacqulynn Cadet M.D.   On: 10/24/2021 13:17    Scheduled Meds:  feeding supplement  237 mL Oral BID BM   ferrous sulfate  325 mg Oral Q breakfast   lactulose  10 g Oral Daily   pantoprazole  40 mg Oral QAC breakfast   sodium chloride flush  3 mL Intravenous Q12H   spironolactone  50 mg Oral Daily   Continuous Infusions:  sodium chloride 250 mL (10/23/21 2057)   sodium chloride 10 mL (10/24/21 0103)   ceFEPime (MAXIPIME) IV 2 g (10/25/21 0541)   dextrose     metronidazole 500 mg (10/25/21 0027)   vancomycin 1,000 mg (10/25/21 0631)     LOS: 2 days   Time spent: [address]  Gerrit Halls, MS4   10/25/2021, 9:40 AM

## 2021-10-25 NOTE — Consult Note (Signed)
[facility] Oncology Progress Note  Name: Lawrence Rogers      MRN: [id number]    Location: J287/O676-72  Date: 10/25/2021 Time:5:19 PM   Subjective: Interval History:Lawrence Rogers is seen today after his EGD.  He is eating a pudding.  Denies any nausea or vomiting or diarrhea.  No abdominal pain reported.  Objective: Vital signs in last 24 hours: Temp:  [97.8 F (36.6 C)-98.6 F (37 C)] 98.6 F (37 C) (11/09 1523) Pulse Rate:  [75-93] 80 (11/09 1523) Resp:  [16-20] 16 (11/09 1523) BP: (90-119)/(51-64) 111/55 (11/09 1523) SpO2:  [93 %-100 %] 100 % (11/09 1523) Weight:  [209 lb 7 oz (95 kg)-209 lb 14.1 oz (95.2 kg)] 209 lb 7 oz (95 kg) (11/09 1325)    Intake/Output from previous day: 11/08 0800 - 11/09 0759 In: 960 [P.O.:960] Out: 950 [Urine:950]    Intake/Output this shift: Total I/O In: 499.4 [I.V.:499.4] Out: 0    PHYSICAL EXAM: BP (!) 111/55   Pulse 80   Temp 98.6 F (37 C)   Resp 16   Ht 6' 2"  (1.88 m)   Wt 209 lb 7 oz (95 kg)   SpO2 100%   BMI 26.89 kg/m  General appearance: alert, cooperative, and appears stated age Lungs:  Bilateral air entry with decreased breath sounds at bases. Heart: regular rate and rhythm Abdomen:  Soft, distended with no palpable masses. Extremities:  3+ edema bilaterally. Neurologic: Grossly normal   Studies/Results: Results for orders placed or performed during the hospital encounter of 10/23/21 (from the past 48 hour(s))  MRSA Next Gen by PCR, Nasal     Status: None   Collection Time: 10/23/21  6:17 PM   Specimen: Nasal Mucosa; Nasal Swab  Result Value Ref Range   MRSA by PCR Next Gen NOT DETECTED NOT DETECTED    Comment: (NOTE) The GeneXpert MRSA Assay (FDA approved for NASAL specimens only), is one component of a comprehensive MRSA colonization surveillance program. It is not intended to diagnose MRSA infection nor to guide or monitor treatment for MRSA infections. Test performance is not FDA approved in  patients less than 21 years old. Performed at [facility], [address], [place], [place] [address]   Magnesium     Status: None   Collection Time: 10/24/21  5:17 AM  Result Value Ref Range   Magnesium 2.2 1.7 - 2.4 mg/dL    Comment: Performed at [facility], [address] [place], [place] [address]  Comprehensive metabolic panel     Status: Abnormal   Collection Time: 10/24/21  5:17 AM  Result Value Ref Range   Sodium 129 (L) 135 - 145 mmol/L   Potassium 3.9 3.5 - 5.1 mmol/L   Chloride 96 (L) 98 - 111 mmol/L   CO2 26 22 - 32 mmol/L   Glucose, Bld 47 (L) 70 - 99 mg/dL    Comment: Glucose reference range applies only to samples taken after fasting for at least 8 hours.   BUN 37 (H) 6 - 20 mg/dL   Creatinine, Ser 1.18 0.61 - 1.24 mg/dL   Calcium 8.6 (L) 8.9 - 10.3 mg/dL   Total Protein 4.7 (L) 6.5 - 8.1 g/dL   Albumin 2.3 (L) 3.5 - 5.0 g/dL   AST 65 (H) 15 - 41 U/L   ALT 29 0 - 44 U/L   Alkaline Phosphatase 386 (H) 38 - 126 U/L   Total Bilirubin 5.4 (H) 0.3 - 1.2 mg/dL   GFR, Estimated >  60 >60 mL/min    Comment: (NOTE) Calculated using the CKD-EPI Creatinine Equation (2021)    Anion gap 7 5 - 15    Comment: Performed at [facility], [address], [place], [place] [address]  CBC     Status: Abnormal   Collection Time: 10/24/21  5:17 AM  Result Value Ref Range   WBC 100.3 (HH) 4.0 - 10.5 K/uL    Comment: This critical result has verified and been called to [facility] by Lorette Ang on 11 08 2022 at 0555, and has been read back.    RBC 3.22 (L) 4.22 - 5.81 MIL/uL   Hemoglobin 9.4 (L) 13.0 - 17.0 g/dL   HCT 28.9 (L) 39.0 - 52.0 %   MCV 89.8 80.0 - 100.0 fL   MCH 29.2 26.0 - 34.0 pg   MCHC 32.5 30.0 - 36.0 g/dL   RDW 20.8 (H) 11.5 - 15.5 %   Platelets 76 (L) 150 - 400 K/uL    Comment: Immature Platelet Fraction may be clinically indicated, consider ordering this additional test [id number]    nRBC 0.0 0.0 - 0.2 %    Comment: Performed at [facility], [address] [place], [place] [address]  Protime-INR     Status: Abnormal   Collection Time: 10/24/21  5:17 AM  Result Value Ref Range   Prothrombin Time 19.4 (H) 11.4 - 15.2 seconds   INR 1.6 (H) 0.8 - 1.2    Comment: (NOTE) INR goal varies based on device and disease states. Performed at [facility], [address] [place], [place] [address]   Ammonia     Status: Abnormal   Collection Time: 10/24/21  5:17 AM  Result Value Ref Range   Ammonia 49 (H) 9 - 35 umol/L    Comment: Performed at [facility], [address] [place], [place] [address]  Lactic acid, plasma     Status: Abnormal   Collection Time: 10/24/21  5:17 AM  Result Value Ref Range   Lactic Acid, Venous 2.4 (HH) 0.5 - 1.9 mmol/L    Comment: CRITICAL VALUE NOTED.  VALUE IS CONSISTENT WITH PREVIOUSLY REPORTED AND CALLED VALUE. Performed at [facility], [address], [place], [place] [address]   Procalcitonin     Status: None   Collection Time: 10/24/21  5:17 AM  Result Value Ref Range   Procalcitonin 4.23 ng/mL    Comment:        Interpretation: PCT > 2 ng/mL: Systemic infection (sepsis) is likely, unless other causes are known. (NOTE)       Sepsis PCT Algorithm           Lower Respiratory Tract                                      Infection PCT Algorithm    ----------------------------     ----------------------------         PCT < 0.25 ng/mL                PCT < 0.10 ng/mL          Strongly encourage             Strongly discourage   discontinuation of antibiotics    initiation of antibiotics    ----------------------------     -----------------------------       PCT 0.25 - 0.50 ng/mL  PCT 0.10 - 0.25 ng/mL               OR       >80% decrease in PCT            Discourage initiation of                                            antibiotics      Encourage discontinuation           of antibiotics    ----------------------------     -----------------------------         PCT >= 0.50 ng/mL              PCT 0.26 -  0.50 ng/mL               AND       <80% decrease in PCT              Encourage initiation of                                             antibiotics       Encourage continuation           of antibiotics    ----------------------------     -----------------------------        PCT >= 0.50 ng/mL                  PCT > 0.50 ng/mL               AND         increase in PCT                  Strongly encourage                                      initiation of antibiotics    Strongly encourage escalation           of antibiotics                                     -----------------------------                                           PCT <= 0.25 ng/mL                                                 OR                                        > 80% decrease in PCT  Discontinue / Do not initiate                                             antibiotics  Performed at [facility], [address], [place], [place] [address]   TSH     Status: None   Collection Time: 10/24/21  5:17 AM  Result Value Ref Range   TSH 2.986 0.350 - 4.500 uIU/mL    Comment: Performed by a 3rd Generation assay with a functional sensitivity of <=0.01 uIU/mL. Performed at [facility], [address] [place], [place] [address]   Osmolality     Status: None   Collection Time: 10/24/21  5:17 AM  Result Value Ref Range   Osmolality 283 275 - 295 mOsm/kg    Comment: Performed at [facility] [address], [place], [place] [address]  Bilirubin, fractionated(tot/dir/indir)     Status: Abnormal   Collection Time: 10/24/21  5:17 AM  Result Value Ref Range   Total Bilirubin 5.1 (H) 0.3 - 1.2 mg/dL   Bilirubin, Direct 2.3 (H) 0.0 - 0.2 mg/dL   Indirect Bilirubin 2.8 (H) 0.3 - 0.9 mg/dL    Comment: Performed at [facility], [address], [place], [place] [address]  Osmolality, urine     Status: None   Collection Time: 10/24/21  9:47 AM  Result Value Ref Range   Osmolality, Ur  532 300 - 900 mOsm/kg    Comment: Performed at [facility] [address] [place], [place] [address]  Sodium, urine, random     Status: None   Collection Time: 10/24/21  9:47 AM  Result Value Ref Range   Sodium, Ur 29 mmol/L    Comment: Performed at [facility], [address] [place], [place] [address]  Glucose, capillary     Status: None   Collection Time: 10/24/21  4:09 PM  Result Value Ref Range   Glucose-Capillary 73 70 - 99 mg/dL    Comment: Glucose reference range applies only to samples taken after fasting for at least 8 hours.  Glucose, capillary     Status: Abnormal   Collection Time: 10/24/21  8:08 PM  Result Value Ref Range   Glucose-Capillary 106 (H) 70 - 99 mg/dL    Comment: Glucose reference range applies only to samples taken after fasting for at least 8 hours.  Glucose, capillary     Status: None   Collection Time: 10/25/21 12:10 AM  Result Value Ref Range   Glucose-Capillary 71 70 - 99 mg/dL    Comment: Glucose reference range applies only to samples taken after fasting for at least 8 hours.  Glucose, capillary     Status: None   Collection Time: 10/25/21  4:01 AM  Result Value Ref Range   Glucose-Capillary 84 70 - 99 mg/dL    Comment: Glucose reference range applies only to samples taken after fasting for at least 8 hours.  Procalcitonin     Status: None   Collection Time: 10/25/21  5:06 AM  Result Value Ref Range   Procalcitonin 4.22 ng/mL    Comment:        Interpretation: PCT > 2 ng/mL: Systemic infection (sepsis) is likely, unless other causes are known. (NOTE)       Sepsis PCT Algorithm           Lower Respiratory Tract  Infection PCT Algorithm    ----------------------------     ----------------------------         PCT < 0.25 ng/mL                PCT < 0.10 ng/mL          Strongly encourage             Strongly discourage   discontinuation of antibiotics    initiation of antibiotics     ----------------------------     -----------------------------       PCT 0.25 - 0.50 ng/mL            PCT 0.10 - 0.25 ng/mL               OR       >80% decrease in PCT            Discourage initiation of                                            antibiotics      Encourage discontinuation           of antibiotics    ----------------------------     -----------------------------         PCT >= 0.50 ng/mL              PCT 0.26 - 0.50 ng/mL               AND       <80% decrease in PCT              Encourage initiation of                                             antibiotics       Encourage continuation           of antibiotics    ----------------------------     -----------------------------        PCT >= 0.50 ng/mL                  PCT > 0.50 ng/mL               AND         increase in PCT                  Strongly encourage                                      initiation of antibiotics    Strongly encourage escalation           of antibiotics                                     -----------------------------                                           PCT <= 0.25 ng/mL  OR                                        > 80% decrease in PCT                                      Discontinue / Do not initiate                                             antibiotics  Performed at [facility], [address], [place], [place] [address]   Comprehensive metabolic panel     Status: Abnormal   Collection Time: 10/25/21  5:06 AM  Result Value Ref Range   Sodium 131 (L) 135 - 145 mmol/L   Potassium 3.3 (L) 3.5 - 5.1 mmol/L   Chloride 95 (L) 98 - 111 mmol/L   CO2 23 22 - 32 mmol/L   Glucose, Bld 50 (L) 70 - 99 mg/dL    Comment: Glucose reference range applies only to samples taken after fasting for at least 8 hours.   BUN 32 (H) 6 - 20 mg/dL   Creatinine, Ser 0.94 0.61 - 1.24 mg/dL   Calcium 9.2 8.9 - 10.3 mg/dL   Total Protein 5.2 (L) 6.5 - 8.1 g/dL    Albumin 2.9 (L) 3.5 - 5.0 g/dL   AST 76 (H) 15 - 41 U/L   ALT 33 0 - 44 U/L   Alkaline Phosphatase 418 (H) 38 - 126 U/L   Total Bilirubin 6.2 (H) 0.3 - 1.2 mg/dL   GFR, Estimated >60 >60 mL/min    Comment: (NOTE) Calculated using the CKD-EPI Creatinine Equation (2021)    Anion gap 13 5 - 15    Comment: Performed at [facility], [address] [place], [place] [address]  Magnesium     Status: None   Collection Time: 10/25/21  5:06 AM  Result Value Ref Range   Magnesium 2.4 1.7 - 2.4 mg/dL    Comment: Performed at [facility], [address] [place], [place] [address]  CBC with Differential/Platelet     Status: Abnormal   Collection Time: 10/25/21  5:06 AM  Result Value Ref Range   WBC 86.6 (HH) 4.0 - 10.5 K/uL    Comment: CRITICAL VALUE NOTED.  VALUE IS CONSISTENT WITH PREVIOUSLY REPORTED AND CALLED VALUE. THIS CRITICAL RESULT HAS VERIFIED AND BEEN CALLED TO [facility],P BY BOBBIE MATTHEWS ON 11 09 2022 AT 9381, AND HAS BEEN READ BACK.     RBC 3.10 (L) 4.22 - 5.81 MIL/uL   Hemoglobin 9.2 (L) 13.0 - 17.0 g/dL   HCT 27.0 (L) 39.0 - 52.0 %   MCV 87.1 80.0 - 100.0 fL   MCH 29.7 26.0 - 34.0 pg   MCHC 34.1 30.0 - 36.0 g/dL   RDW 20.9 (H) 11.5 - 15.5 %   Platelets 76 (L) 150 - 400 K/uL    Comment: SPECIMEN CHECKED FOR CLOTS Immature Platelet Fraction may be clinically indicated, consider ordering this additional test [id number] CONSISTENT WITH PREVIOUS RESULT    nRBC 0.0 0.0 - 0.2 %   Neutrophils Relative % 88 %   Neutro Abs 78.8 (H) 1.7 - 7.7 K/uL   Band  Neutrophils 3 %   Lymphocytes Relative 4 %   Lymphs Abs 3.5 0.7 - 4.0 K/uL   Monocytes Relative 0 %   Monocytes Absolute 0.0 (L) 0.1 - 1.0 K/uL   Eosinophils Relative 1 %   Eosinophils Absolute 0.9 (H) 0.0 - 0.5 K/uL   Basophils Relative 0 %   Basophils Absolute 0.0 0.0 - 0.1 K/uL   WBC Morphology MILD LEFT SHIFT (1-5% METAS, OCC MYELO, OCC BANDS)     Comment: TOXIC GRANULATION VACUOLATED NEUTROPHILS    Smear Review  MORPHOLOGY UNREMARKABLE     Comment: PLATELET COUNT CONFIRMED BY SMEAR   Metamyelocytes Relative 2 %   Myelocytes 2 %   Dohle Bodies PRESENT    Burr Cells PRESENT    Polychromasia PRESENT    Ovalocytes PRESENT     Comment: Performed at [facility], [address] [place], [place] [address]  Lactic acid, plasma     Status: Abnormal   Collection Time: 10/25/21  5:06 AM  Result Value Ref Range   Lactic Acid, Venous 2.7 (HH) 0.5 - 1.9 mmol/L    Comment: CRITICAL VALUE NOTED.  VALUE IS CONSISTENT WITH PREVIOUSLY REPORTED AND CALLED VALUE. Performed at [facility], [address] [place] [address] [place], [place] [address]   Ammonia     Status: Abnormal   Collection Time: 10/25/21  5:06 AM  Result Value Ref Range   Ammonia 55 (H) 9 - 35 umol/L    Comment: Performed at [facility], [address], [place], [place] [address]  Glucose, capillary     Status: None   Collection Time: 10/25/21  7:46 AM  Result Value Ref Range   Glucose-Capillary 76 70 - 99 mg/dL    Comment: Glucose reference range applies only to samples taken after fasting for at least 8 hours.  Type and screen [facility]     Status: None (Preliminary result)   Collection Time: 10/25/21 10:58 AM  Result Value Ref Range   ABO/RH(D) O POS    Antibody Screen NEG    Sample Expiration 10/28/2021,2359    Unit Number [id number]    Blood Component Type RED CELLS,LR    Unit division 00    Status of Unit ALLOCATED    Transfusion Status OK TO TRANSFUSE    Crossmatch Result      Compatible Performed at [facility], [address] [place] [address] [place], [place] [address]   Prepare RBC (crossmatch)     Status: None   Collection Time: 10/25/21 11:04 AM  Result Value Ref Range   Order Confirmation      ORDER PROCESSED BY BLOOD BANK Performed at [facility], [address] [place], [place] [address]   Glucose, capillary     Status: None   Collection Time: 10/25/21 12:03 PM  Result Value Ref Range   Glucose-Capillary 78 70 - 99 mg/dL     Comment: Glucose reference range applies only to samples taken after fasting for at least 8 hours.  Protime-INR     Status: Abnormal   Collection Time: 10/25/21 12:40 PM  Result Value Ref Range   Prothrombin Time 20.0 (H) 11.4 - 15.2 seconds   INR 1.7 (H) 0.8 - 1.2    Comment: (NOTE) INR goal varies based on device and disease states. Performed at [facility], [address], [place], [place] [address]   Glucose, capillary     Status: Abnormal   Collection Time: 10/25/21  1:31 PM  Result Value Ref Range   Glucose-Capillary 69 (L)  70 - 99 mg/dL    Comment: Glucose reference range applies only to samples taken after fasting for at least 8 hours.  Glucose, capillary     Status: Abnormal   Collection Time: 10/25/21  1:58 PM  Result Value Ref Range   Glucose-Capillary 142 (H) 70 - 99 mg/dL    Comment: Glucose reference range applies only to samples taken after fasting for at least 8 hours.  Glucose, capillary     Status: Abnormal   Collection Time: 10/25/21  5:13 PM  Result Value Ref Range   Glucose-Capillary 125 (H) 70 - 99 mg/dL    Comment: Glucose reference range applies only to samples taken after fasting for at least 8 hours.   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 CONTRAST:  75m GADAVIST GADOBUTROL 1 MMOL/ML IV SOLN COMPARISON:  CT abdomen/pelvis dated 10/23/2021 FINDINGS: Motion degraded images. Lower chest: Moderate right and small left pleural effusions. Associated bilateral lower lobe opacities, likely atelectasis. Hepatobiliary: Cirrhosis. 9.5 cm infiltrating enhancing mass in the posterior right hepatic dome, centered in segment 7 (series 6/image 15), compatible with hepatocellular  carcinoma (LI-RADS 5). Additional 2.1 cm enhancing lesion in segment 5 (series 14/image 51), also compatible with HCC (LI-RADS 5). Gallbladder is underdistended with mild gallbladder wall thickening/edema, likely secondary to venous congestion/ascites. No cholelithiasis. No intrahepatic or extrahepatic ductal dilatation. Pancreas:  Within normal limits. Spleen:  Enlarged, measuring 20.2 cm in craniocaudal dimension. Adrenals/Urinary Tract:  Adrenal glands are within normal limits. 1.6 cm cyst with layering hemorrhage in the posterior right upper kidney (series 4/image 30), benign (Bosniak II). Left kidney is within normal limits. No hydronephrosis. Stomach/Bowel: Stomach is within normal limits. Visualized bowel is grossly unremarkable. Vascular/Lymphatic:  No evidence of abdominal aortic aneurysm. Main portal vein thrombosis (series 18/image 45), extending into the left and right portal veins (series 201/images 34 and 42). No associated findings to suggest tumor thrombus. Gastroesophageal varices. Other: Small to moderate abdominal ascites, predominantly perihepatic. Musculoskeletal: No focal osseous lesions. 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 No associated findings to suggest tumor thrombus. Cirrhosis. Splenomegaly. Gastroesophageal varices. Small to moderate abdominal ascites. Moderate right and small left pleural effusions. Electronically Signed   By: SJulian HyM.D.   On: 10/24/2021 20:27   [place] DOPPLER  Result Date: 10/24/2021 CLINICAL DATA:  60year old male with a history of hepatic cirrhosis and recent imaging findings concerning for hepatocellular carcinoma and portal vein occlusion. EXAM: DUPLEX ULTRASOUND OF LIVER TECHNIQUE: Color and duplex Doppler ultrasound was performed to evaluate the hepatic in-flow and out-flow vessels. COMPARISON:  CT  abdomen/pelvis 10/23/2021 FINDINGS: Liver: Markedly nodular hepatic contour. Diffuse heterogeneity of the hepatic parenchyma. Discrete hepatic lesions are difficult to visualize given the heterogeneity of the background parenchyma. No biliary ductal dilatation. 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 Heterogeneously echogenic material is visualized within the vessel lumen. Hepatic Vein Velocities Right:  42 cm/sec Middle:  45 cm/sec Left:  43 cm/sec IVC: Questionable thrombus visualized within the IVC. Hepatic Artery Velocity:  46 cm/sec Splenic Vein Velocity: Occluded Spleen: 18.1 cm x 7.3  cm x 19.6 cm with a total volume of 1,352 cm^3 (411 cm^3 is upper limit normal) Portal Vein Occlusion/Thrombus: Yes Splenic Vein Occlusion/Thrombus: Yes Ascites: Present Varices: Present IMPRESSION: 1. 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 It is unclear if this represents bland thrombus or tumor thrombus. 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 2. Questionable thrombus within the inferior vena cava. If truly representative of thrombus, this would almost certainly be tumor thrombus extending through the hepatic veins into the IVC. 3. Small cirrhotic liver with heavily nodular contour. 4. Although suspected on CT imaging, discrete hepatic mass and lesions not visualized likely due to the severe heterogeneity of the background parenchyma. 5. Ascites. Recommend further evaluation with gadolinium-enhanced MRI of the abdomen to assess for hepatocellular carcinoma and tumor thrombus within the IVC and portal veins. Electronically Signed   By: Jacqulynn Cadet M.D.   On: 10/24/2021 13:17     MEDICATIONS: I have reviewed the patient's current medications.     Assessment:  1.  Leukemoid reaction: - White count on presentation 137, left shift.  White count today improved to 100.3. - Prior white count on 07/24/2021 was normal at 5.4. - Blood cultures from 10/23/2021 showed  aerobic and anaerobic-gram-positive cocci in clusters - He is being treated for SBP.  2.  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  Cirrhosis complicated by ascites and nonbleeding esophageal varices, status post banding (11/2019 EGD showed G2-3 varices) at [facility].  No known hepatic encephalopathy. - CTAP on 10/23/2021 without contrast showed hypoattenuating liver lesions, suspicious for multifocal HCC. - AFP elevated at 64.3 (10/23/2021).  AFP 9.3 (08/02/2021) at [facility]. - Ultrasound Doppler of the liver on 10/24/2021 showed complete occlusion of the main and intrahepatic portal veins, unclear if it is plan thrombus or tumor thrombus.  Questionable thrombus within the IVC.  Small cirrhotic liver with heavily nodular contour.  No discrete hepatic mass lesion identified. - Paracentesis of 3.6 L on 10/24/2021. - EGD on 10/25/2021-hypopharynx and proximal esophagus normal.  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  5 bands were successfully placed with incomplete eradication of varices.  There was no bleeding during and at the end of the procedure.  Moderate portal hypertensive gastropathy found in the gastric fundus and body.  Multiple 4 to 9 mm apparently did not sessile polyps with no bleeding and no stigmata of recent bleeding.  Duodenum bulb and second portion normal. - MRI of the liver with and without contrast on 10/24/2021 showed 9.5 cm infiltrating enhancing mass in the posterior right hepatic dome, centered in segment 7, compatible with HCC.  Additional 2.1 cm enhancing lesion in segment 5 also compatible with HCC.  3.  Social/family history: - Lives at home with his daughter who works as an Therapist, sports at [employer].  He used to work as a Designer, industrial/product at [employer].  Quit smoking several years ago. - Maternal grand mother had pancreatic cancer.  Maternal grandfather had lung cancer.  Paternal grandfather had lung  cancer.   Plan:  1.  Leukemoid reaction: - White count today is further improved to 86.6.  Again differential is predominantly left shifted. - Continue antibiotics for SBP and sepsis.  2.  Multifocal HCC: - MRI of the liver showed 9.5 cm mass in the posterior right hepatic and additional 2.1 cm enhancing lesion in segment 5 compatible with multifocal HCC. - Based on the UNOS size criteria, he  is not a candidate for transplant.  However we will reach out to his transplant team just to confirm it. - If he is confirmed ineligible for transplant, will offer systemic therapy.  Based on the size of the lesions, he is not a candidate for liver directed therapy. - We will also consider biopsy for NGS testing which will open up options for treatments. - He is Ardine Eng class C (11 points) based on current labs with bilirubin more than 5.  This could be elevated now secondary to SBP. - However his bilirubin on 08/02/2021 was 1.5, which could potentially make him child class B and be eligible for certain form of treatments. - If he remains in child's class C, he will not be a candidate for systemic therapy.  All questions were answered. The patient knows to call the clinic with any problems, questions or concerns. We can certainly see the patient much sooner if necessary.    Derek Jack

## 2021-10-25 NOTE — Consult Note (Signed)
Consultation Note Date: 10/25/2021   Patient Name: Lawrence Rogers  DOB: 1961-01-10  MRN: [id number]  Age / Sex: 60 y.o., male  PCP: [name], MD Referring Physician: Murlean Iba, MD  Reason for Consultation: Establishing goals of care and Psychosocial/spiritual support  HPI/Patient Profile: 60 y.o. 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   Clinical Assessment and Goals of Care: I have reviewed medical records including EPIC notes, labs and imaging, received report from RN, assessed the patient and then met at the bedside along with aunt, Lawrence Rogers, to discuss diagnosis prognosis, GOC, EOL wishes, disposition and options.  Lawrence Rogers, is sitting up in the [place] chair in his room.  He greets me, making it mostly keeping eye contact.  He appears acutely/chronically ill and quite frail with temporal wasting and abdominal ascites.  He is alert and oriented, able to make his basic needs known.  I introduced Palliative Medicine as specialized medical care for people living with serious illness. It focuses on providing relief from the symptoms and stress of a serious illness. The goal is to improve quality of life for both the patient and the family.  We discussed a brief life review of the patient.  Lawrence Rogers has been working at [employer] in [place] until he fell at work injuring his shoulder.  He had shoulder surgery in August and has been out of work since that time.  He tells me that his  stepdaughter, Lawrence Rogers, lives in his home.  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  His other stepdaughter, Lawrence Rogers, is a Equities trader here at [facility].  We then focused on their current illness.  Lawrence Rogers seems knowledgeable about his acute health concerns.  He understands that he is to have a EGD today for possible banding and further testing.  He understands to follow-up with oncology outpatient.  The natural disease trajectory and expectations at EOL were discussed.  Advanced directives, concepts specific to code status, were considered and discussed.  We talked about "treat the treatable, but allowing natural passing".  Lawrence Rogers readily agrees to DNR.  Orders adjusted.  Palliative Care services outpatient were explained and offered.  Lawrence Rogers is readily open to excepting outpatient palliative services.  Provider choice offered.  He chooses [facility].  Discussed the importance of continued conversation with patient and family and the medical providers regarding overall plan of care and treatment options, ensuring decisions are within the context of the patient's values and GOCs.  Questions and concerns were addressed.  The patient and family was encouraged to call with questions or concerns.  PMT will continue to support holistically.  Conference with attending, bedside nursing  staff, transition of care team related to patient condition, needs, goals of care, disposition.   HCPOA OTHER -Lawrence Rogers tells me that he would like for his stepdaughter, Lawrence Rogers, be his healthcare surrogate.  She is a Equities trader here at [facility].  Lawrence Rogers is unmarried, his parents are deceased.  He does have 2 brothers, but they are not involved in his care.    SUMMARY OF RECOMMENDATIONS   At this point continue to treat the treatable but no CPR or intubation. Follow-up with oncology outpatient. Outpatient palliative services with [facility] home with home health if  warranted   Code Status/Advance Care Planning: DNR -Lawrence Rogers states that he would like to treat the treatable but allowing natural passing.  He shares that he believes his stepdaughters Lawrence Rogers and Lawrence Rogers would abide his wish.  His aunt, Lawrence Rogers, who is at bedside states that she could abide his wish for DNR.  Symptom Management:  Per hospitalist, no additional needs at this time.  Palliative Prophylaxis:  Frequent Pain Assessment and Oral Care  Additional Recommendations (Limitations, Scope, Preferences): Treat the treatable but no CPR or intubation.  Psycho-social/Spiritual:  Desire for further Chaplaincy support:yes Additional Recommendations: Caregiving  Support/Resources and Education on Hospice  Prognosis:  Unable to determine, based on outcomes and oncology recommendations.  Guarded at this point.  3 to 6 months or less would not be surprising based on severity of presentation.  Discharge Planning:  Anticipate home with home health and outpatient palliative services.       Primary Diagnoses: Present on Admission:  Cirrhosis of liver with ascites ([place])   I have reviewed the medical record, interviewed the patient and family, and examined the patient. The following aspects are pertinent.  Past Medical History:  Diagnosis Date   Arthritis    Cirrhosis ([place])    Depression    Diabetes ([place])    Social History   Socioeconomic History   Marital status: Single    Spouse name: Not on file   Number of children: Not on file   Years of education: Not on file   Highest education level: Not on file  Occupational History   Not on file  Tobacco Use   Smoking status: Former    Packs/day: 0.25    Years: 15.00    Pack years: 3.75    Types: Cigarettes    Quit date: 11/30/1994    Years since quitting: 26.9   Smokeless tobacco: Never  Vaping Use   Vaping Use: Never used  Substance and Sexual Activity   Alcohol use: Never    Comment: Has not drank in 84 yrs.    Drug use:  Never   Sexual activity: Not Currently  Other Topics Concern   Not on file  Social History Narrative   Not on file   Social Determinants of Health   Financial Resource Strain: Not on file  Food Insecurity: Not on file  Transportation Needs: Not on file  Physical Activity: Not on file  Stress: Not on file  Social Connections: Not on file   History reviewed. No pertinent family history. Scheduled Meds:  [MAR Hold] sodium chloride   Intravenous Once   [MAR Hold] feeding supplement  237 mL Oral BID BM   [MAR Hold] ferrous sulfate  325 mg Oral Q breakfast   [MAR Hold] lactulose  10 g Oral Daily   [MAR Hold] pantoprazole  40 mg Oral QAC breakfast   [MAR Hold] sodium chloride flush  3 mL  Intravenous Q12H   [MAR Hold] spironolactone  50 mg Oral Daily   Continuous Infusions:  [MAR Hold] sodium chloride 250 mL (10/23/21 2057)   [MAR Hold] sodium chloride 10 mL (10/24/21 0103)   sodium chloride     [MAR Hold] ceFEPime (MAXIPIME) IV 2 g (10/25/21 0541)   dextrose 30 mL/hr at 10/25/21 1015   lactated ringers 50 mL/hr at 10/25/21 1329   [MAR Hold] metronidazole 500 mg (10/25/21 1402)   [MAR Hold] vancomycin 1,000 mg (10/25/21 0631)   PRN Meds:.[MAR Hold] sodium chloride, [MAR Hold] sodium chloride, [MAR Hold] acetaminophen **OR** [MAR Hold] acetaminophen, fentaNYL, [MAR Hold] methocarbamol, [MAR Hold] ondansetron **OR** [MAR Hold] ondansetron (ZOFRAN) IV, [MAR Hold] sodium chloride flush, [MAR Hold] traMADol Medications Prior to Admission:  Prior to Admission medications   Medication Sig Start Date End Date Taking? Authorizing Provider  atorvastatin (LIPITOR) 20 MG tablet Take 20 mg by mouth daily.   Yes [provider]  bumetanide (BUMEX) 2 MG tablet Take 2 mg by mouth daily. 07/19/21  Yes [provider]  cholecalciferol (VITAMIN D3) 25 MCG (1000 UNIT) tablet Take 1,000 Units by mouth daily.   Yes [provider]  ferrous sulfate 325 (65 FE) MG tablet Take 1  tablet (325 mg total) by mouth daily with breakfast. 10/25/19  Yes Rehman, Mechele Dawley, MD  glimepiride (AMARYL) 2 MG tablet Take 2 mg by mouth daily. 10/09/19  Yes [provider]  metFORMIN (GLUCOPHAGE-XR) 500 MG 24 hr tablet Take 2 tablets (1,000 mg total) by mouth 2 (two) times daily. 12/05/19  Yes Rehman, Mechele Dawley, MD  methocarbamol (ROBAXIN) 500 MG tablet Take 1 tablet (500 mg total) by mouth every 6 (six) hours as needed for muscle spasms. 08/04/21  Yes Nicholes Stairs, MD  pantoprazole (PROTONIX) 40 MG tablet Take 1 tablet (40 mg total) by mouth 2 (two) times daily. TAKE ONE TABLET (40MG TOTAL) BY MOUTH DAILY BEFORE BREAKFAST Patient taking differently: Take 40 mg by mouth daily before breakfast. 03/10/20  Yes Rehman, Mechele Dawley, MD  spironolactone (ALDACTONE) 100 MG tablet Take 2 tablets (200 mg total) by mouth daily. 07/30/19  Yes Aline August, MD  traMADol (ULTRAM) 50 MG tablet Take 50 mg by mouth 3 (three) times daily as needed for moderate pain.   Yes [provider]  ondansetron (ZOFRAN) 4 MG tablet Take 1 tablet (4 mg total) by mouth every 8 (eight) hours as needed for nausea or vomiting. 08/04/21 08/04/22  Nicholes Stairs, MD  oxyCODONE (ROXICODONE) 5 MG immediate release tablet Take 1 tablet (5 mg total) by mouth every 6 (six) hours as needed for severe pain. 08/04/21 08/04/22  Nicholes Stairs, MD   No Known Allergies Review of Systems  Unable to perform ROS: Acuity of condition   Physical Exam Vitals and nursing note reviewed.  Constitutional:      General: He is not in acute distress.    Appearance: He is ill-appearing.  HENT:     Head:     Comments: Temporal wasting    Mouth/Throat:     Mouth: Mucous membranes are moist.  Cardiovascular:     Rate and Rhythm: Normal rate.  Pulmonary:     Effort: Pulmonary effort is normal. No respiratory distress.  Abdominal:     General: There is distension.  Musculoskeletal:        General: Swelling  present.  Skin:    General: Skin is warm and dry.     Coloration: Skin is  jaundiced.  Neurological:     Mental Status: He is alert and oriented to person, place, and time.  Psychiatric:        Mood and Affect: Mood normal.        Behavior: Behavior normal.    Vital Signs: BP (!) 119/59   Rogers 93   Temp 98.3 F (36.8 C) (Oral)   Resp 18   Ht 6' 2"  (1.88 m)   Wt 95 kg   SpO2 97%   BMI 26.89 kg/m  Pain Scale: 0-10   Pain Score: 5    SpO2: SpO2: 97 % O2 Device:SpO2: 97 % O2 Flow Rate: .   IO: Intake/output summary:  Intake/Output Summary (Last 24 hours) at 10/25/2021 1404 Last data filed at 10/25/2021 0900 Gross per 24 hour  Intake 360 ml  Output 400 ml  Net -40 ml    LBM: Last BM Date: 10/23/21 Baseline Weight: Weight: 101 kg Most recent weight: Weight: 95 kg     Palliative Assessment/Data:   Flowsheet Rows    Flowsheet Row Most Recent Value  Intake Tab   Referral Department Hospitalist  Unit at Time of Referral Cardiac/Telemetry Unit  Palliative Care Primary Diagnosis Cancer  Date Notified 10/24/21  Palliative Care Type New Palliative care  Reason for referral Clarify Goals of Care, Counsel Regarding Hospice  Date of Admission 10/23/21  Date first seen by Palliative Care 10/25/21  # of days Palliative referral response time 1 Day(s)  # of days IP prior to Palliative referral 1  Clinical Assessment   Palliative Performance Scale Score 50%  Pain Max last 24 hours Not able to report  Pain Min Last 24 hours Not able to report  Dyspnea Max Last 24 Hours Not able to report  Dyspnea Min Last 24 hours Not able to report  Psychosocial & Spiritual Assessment   Palliative Care Outcomes        Time In: 1010 Time Out: 1100 Time Total: 50 minutes  Greater than 50%  of this time was spent counseling and coordinating care related to the above assessment and plan.  Signed by: Drue Novel, NP   Please contact Palliative Medicine Team phone at [phone] for  questions and concerns.  For individual provider: See Shea Evans

## 2021-10-25 NOTE — Anesthesia Preprocedure Evaluation (Signed)
Anesthesia Evaluation  Patient identified by MRN, date of birth, ID band Patient awake    Reviewed: Allergy & Precautions, NPO status , Patient's Chart, lab work & pertinent test results  Airway Mallampati: II  TM Distance: >3 FB Neck ROM: Full    Dental  (+) Edentulous Upper, Edentulous Lower   Pulmonary former smoker,    Pulmonary exam normal breath sounds clear to auscultation       Cardiovascular negative cardio ROS Normal cardiovascular exam Rhythm:Regular Rate:Normal     Neuro/Psych PSYCHIATRIC DISORDERS Depression negative neurological ROS     GI/Hepatic PUD, (+) Cirrhosis   Esophageal Varices and ascites    , Hepatocellular carcinoma   Endo/Other  negative endocrine ROSdiabetes, Well Controlled, Type 2, Oral Hypoglycemic Agents  Renal/GU negative Renal ROS  negative genitourinary   Musculoskeletal  (+) Arthritis ,   Abdominal   Peds negative pediatric ROS (+)  Hematology negative hematology ROS (+)   Anesthesia Other Findings IMPRESSION: 1. No posttraumatic deformity identified. 2. Cirrhosis and portal venous hypertension with moderate volume ascites. Hypoattenuating liver lesions, suspicious for multifocal hepatocellular carcinoma. When the patient is clinically stable and able to follow directions and hold their breath (preferably as an outpatient) further evaluation with dedicated abdominal MRI should be considered. 3. Right pleural effusion with bibasilar airspace disease. Most likely atelectasis. At the right lung base, infection cannot be excluded. 4. Bilateral nephrolithiasis. 5. Gastric wall thickening in the setting of underdistention. Recommend clinical exclusion of gastritis. Hypoalbuminemia could create a similar appearance. 6. 3 mm lingular nodule. No follow-up needed if patient is low-risk. Non-contrast chest CT can be considered in 12 months if patient is high-risk. This  recommendation follows the consensus statement: Guidelines for Management of Incidental Pulmonary Nodules Detected on CT Images: From the Fleischner Society 2017; Radiology 2017; 284:228-243. 7. Bilateral gynecomastia.  These results were called by telephone at the time of interpretation on 10/23/2021 at 12:37 pm to provider [facility] ZAMMIT , who verbally acknowledged these results.   Electronically Signed   By: Abigail Miyamoto M.D.   On: 10/23/2021 12:38  Reproductive/Obstetrics negative OB ROS                             Anesthesia Physical Anesthesia Plan  ASA: 4  Anesthesia Plan: General   Post-op Pain Management:    Induction: Intravenous  PONV Risk Score and Plan: TIVA  Airway Management Planned: Nasal Cannula and Natural Airway  Additional Equipment:   Intra-op Plan:   Post-operative Plan: Possible Post-op intubation/ventilation  Informed Consent: I have reviewed the patients History and Physical, chart, labs and discussed the procedure including the risks, benefits and alternatives for the proposed anesthesia with the patient or authorized representative who has indicated his/her understanding and acceptance.    Discussed DNR with patient and Suspend DNR.   Dental advisory given  Plan Discussed with: CRNA and Surgeon  Anesthesia Plan Comments: (Agreed to get brief resuscitation in case of cardiac arrest.)        Anesthesia Quick Evaluation

## 2021-10-25 NOTE — Progress Notes (Signed)
Brief EGD note.    Normal mucosa of the proximal esophagus.   Grade 3 and 4 3 columns of esophageal varices in mid and distal esophagus along with scarring in distal esophagus from prior banding.   Red markings on varices but no active bleeding.   3 columns were banded.  5 bands applied.  moderate portal hypertensive gastropathy.   Multiple hyperplastic prepyloric hyperplastic appearing polyps along with 1 at pyloric channel.   Normal bulbar and post bulbar mucosa.

## 2021-10-25 NOTE — Progress Notes (Signed)
Subjective: Patient states he is unable to get comfortable today. He reports that his abdomen still feels pretty swollen and tight, reports it felt better immediately after paracentesis, however, returned to feeling tight and swollen shortly after. His legs continue to be swollen as well. He still remains without a BM since Monday, despite starting lactulose yesterday. He reports occasional sob with activity. Denies nausea or vomiting. No rectal bleeding or hematemesis. He is hungry. No episodes of confusion.   Objective: Vital signs in last 24 hours: Temp:  [98.2 F (36.8 C)-98.3 F (36.8 C)] 98.3 F (36.8 C) (11/09 0816) Pulse Rate:  [90-99] 90 (11/09 0816) Resp:  [18-20] 20 (11/09 0816) BP: (95-117)/(58-78) 108/60 (11/09 0816) SpO2:  [94 %-98 %] 95 % (11/09 0816) Weight:  [95.2 kg] 95.2 kg (11/09 0500) Last BM Date: 10/23/21 General:   Alert and oriented, pleasant Head:  Normocephalic and atraumatic. Eyes:   Conjuctiva pink. Slcera are icteric Mouth:  Without lesions, mucosa pink and moist.  Heart:  S1, S2 present, no murmurs noted.  Lungs: Clear to auscultation bilaterally, without wheezing, rales, or rhonchi.  Abdomen:  Bowel sounds present, abdomen is distended and somewhat taut, no pain with palpation, no guarding or rebound.  Msk:  Symmetrical without gross deformities. Normal posture. Pulses:  Normal pulses noted. Extremities:  3+ edema to bilateral lower legs Neurologic:  Alert and  oriented x4;  grossly normal neurologically. Skin:  Warm and dry, jaundiced Psych:  Alert and cooperative. Normal mood and affect.  Intake/Output from previous day: 11/08 0701 - 11/09 0700 In: 960 [P.O.:960] Out: 950 [Urine:950] Intake/Output this shift: No intake/output data recorded.  Lab Results: Recent Labs    10/23/21 1044 10/24/21 0517 10/25/21 0506  WBC 137.1* 100.3* 86.6*  HGB 11.6* 9.4* 9.2*  HCT 36.2* 28.9* 27.0*  PLT 111* 76* 76*   BMET Recent Labs    10/23/21 1044  10/24/21 0517 10/25/21 0506  NA 132* 129* 131*  K 4.1 3.9 3.3*  CL 93* 96* 95*  CO2 22 26 23   GLUCOSE 85 47* 50*  BUN 35* 37* 32*  CREATININE 1.39* 1.18 0.94  CALCIUM 9.5 8.6* 9.2   LFT Recent Labs    10/23/21 1044 10/24/21 0517 10/25/21 0506  PROT 5.4* 4.7* 5.2*  ALBUMIN 2.2* 2.3* 2.9*  AST 77* 65* 76*  ALT 37 29 33  ALKPHOS 577* 386* 418*  BILITOT 5.5* 5.1*  5.4* 6.2*  BILIDIR  --  2.3*  --   IBILI  --  2.8*  --    PT/INR Recent Labs    10/23/21 1044 10/24/21 0517  LABPROT 17.7* 19.4*  INR 1.5* 1.6*    Studies/Results: CT ABDOMEN PELVIS WO CONTRAST  Result Date: 10/23/2021 CLINICAL DATA:  Abdominal distension. Fall. Low back pain. Cirrhosis. Diabetes. EXAM: CT ABDOMEN AND PELVIS WITHOUT CONTRAST TECHNIQUE: Multidetector CT imaging of the abdomen and pelvis was performed following the standard protocol without IV contrast. COMPARISON:  05/20/2020 abdominal ultrasound. 10/07/2018 abdominopelvic CT. FINDINGS: Lower chest: right greater than left dependent lower lobe airspace disease. A 3 mm lingular nodule on 06/05 is not readily apparent on the prior. Small right pleural effusion is new. Normal heart size. Bilateral gynecomastia. Marked periesophageal varices. Hepatobiliary: Subtle hypoattenuating right hepatic lobe mass including at 8.2 x 8.8 cm, suboptimally evaluated on this noncontrast CT. Example 19/3. Likely separate more inferior right hepatic lobe hypoattenuating lesion of 2.2 cm on 30/3. Normal gallbladder, without biliary ductal dilatation. Pancreas: Pancreatic atrophy, without duct dilatation. Spleen: Splenomegaly,  including at 18.4 cm craniocaudal. Adrenals/Urinary Tract: Normal adrenal glands. Bilateral renal collecting system calculi, maximally 1.0 cm in the interpolar left kidney. No hydronephrosis. No hydroureter or ureteric calculi. No bladder calculi. Stomach/Bowel: The gastric body is underdistended but appears thick walled on 22/3. The colon is  underdistended and poorly evaluated. Normal small bowel caliber. Vascular/Lymphatic: Aortic atherosclerosis. Extensive perigastric varices. Small abdominal retroperitoneal nodes are likely reactive in the setting of cirrhosis. Index precaval 10 mm node on 38/3 is similar. No pelvic sidewall adenopathy. Reproductive: Normal prostate. Other: Moderate volume abdominopelvic ascites. No free intraperitoneal air. Musculoskeletal: No acute osseous abnormality. IMPRESSION: 1. No posttraumatic deformity identified. 2. Cirrhosis and portal venous hypertension with moderate volume ascites. Hypoattenuating liver lesions, suspicious for multifocal hepatocellular carcinoma. When the patient is clinically stable and able to follow directions and hold their breath (preferably as an outpatient) further evaluation with dedicated abdominal MRI should be considered. 3. Right pleural effusion with bibasilar airspace disease. Most likely atelectasis. At the right lung base, infection cannot be excluded. 4. Bilateral nephrolithiasis. 5. Gastric wall thickening in the setting of underdistention. Recommend clinical exclusion of gastritis. Hypoalbuminemia could create a similar appearance. 6. 3 mm lingular nodule. No follow-up needed if patient is low-risk. Non-contrast chest CT can be considered in 12 months if patient is high-risk. This recommendation follows the consensus statement: Guidelines for Management of Incidental Pulmonary Nodules Detected on CT Images: From the Fleischner Society 2017; Radiology 2017; 284:228-243. 7. Bilateral gynecomastia. These results were called by telephone at the time of interpretation on 10/23/2021 at 12:37 pm to provider [facility] ZAMMIT , who verbally acknowledged these results. Electronically Signed   By: Abigail Miyamoto M.D.   On: 10/23/2021 12:38   MR LIVER W WO CONTRAST  Result Date: 10/24/2021 CLINICAL DATA:  Cirrhosis, new liver lesions on CT, occlusion of main and intrahepatic portal veins EXAM: MRI  ABDOMEN WITHOUT AND WITH CONTRAST TECHNIQUE: Multiplanar multisequence MR imaging of the abdomen was performed both before and after the administration of intravenous contrast. CONTRAST:  37m GADAVIST GADOBUTROL 1 MMOL/ML IV SOLN COMPARISON:  CT abdomen/pelvis dated 10/23/2021 FINDINGS: Motion degraded images. Lower chest: Moderate right and small left pleural effusions. Associated bilateral lower lobe opacities, likely atelectasis. Hepatobiliary: Cirrhosis. 9.5 cm infiltrating enhancing mass in the posterior right hepatic dome, centered in segment 7 (series 6/image 15), compatible with hepatocellular carcinoma (LI-RADS 5). Additional 2.1 cm enhancing lesion in segment 5 (series 14/image 51), also compatible with HCC (LI-RADS 5). Gallbladder is underdistended with mild gallbladder wall thickening/edema, likely secondary to venous congestion/ascites. No cholelithiasis. No intrahepatic or extrahepatic ductal dilatation. Pancreas:  Within normal limits. Spleen:  Enlarged, measuring 20.2 cm in craniocaudal dimension. Adrenals/Urinary Tract:  Adrenal glands are within normal limits. 1.6 cm cyst with layering hemorrhage in the posterior right upper kidney (series 4/image 30), benign (Bosniak II). Left kidney is within normal limits. No hydronephrosis. Stomach/Bowel: Stomach is within normal limits. Visualized bowel is grossly unremarkable. Vascular/Lymphatic:  No evidence of abdominal aortic aneurysm. Main portal vein thrombosis (series 18/image 45), extending into the left and right portal veins (series 201/images 34 and 42). No associated findings to suggest tumor thrombus. Gastroesophageal varices. Other: Small to moderate abdominal ascites, predominantly perihepatic. Musculoskeletal: No focal osseous lesions. 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 No associated findings to  suggest tumor thrombus. Cirrhosis. Splenomegaly. Gastroesophageal varices. Small to moderate abdominal ascites.  Moderate right and small left pleural effusions. Electronically Signed   By: Julian Hy M.D.   On: 10/24/2021 20:27   US Paracentesis  Result Date: 10/23/2021 INDICATION: Cirrhosis, ascites EXAM: ULTRASOUND GUIDED DIAGNOSTIC AND THERAPEUTIC PARACENTESIS MEDICATIONS: None. COMPLICATIONS: None immediate. PROCEDURE: Informed written consent was obtained from the patient after a discussion of the risks, benefits and alternatives to treatment. A timeout was performed prior to the initiation of the procedure. Initial ultrasound scanning demonstrates a large amount of ascites within the LEFT lower abdominal quadrant. The right lower abdomen was prepped and draped in the usual sterile fashion. 1% lidocaine was used for local anesthesia. Following this, a 5 [place] Yueh catheter was introduced. An ultrasound image was saved for documentation purposes. The paracentesis was performed. The catheter was removed and a dressing was applied. The patient tolerated the procedure well without immediate post procedural complication. FINDINGS: A total of approximately 3.6 L of yellow ascitic fluid was removed. Samples were sent to the laboratory as requested by the clinical team. IMPRESSION: Successful ultrasound-guided paracentesis yielding 3.6 liters of peritoneal fluid. Electronically Signed   By: Lavonia Dana M.D.   On: 10/23/2021 15:00   DG Chest Port 1 View  Result Date: 10/23/2021 CLINICAL DATA:  Weakness EXAM: PORTABLE CHEST 1 VIEW COMPARISON:  Chest x-ray 09/22/2021 FINDINGS: Heart size is upper normal. Mediastinum appears stable. Mild calcified plaques in the aortic arch. Mild pulmonary vascular/interstitial prominence. Small left and moderate right pleural effusion with associated atelectasis/infiltrate. No pneumothorax. IMPRESSION: Bilateral pleural effusions right greater than left. Mild pulmonary  vascular prominence. Electronically Signed   By: Ofilia Neas M.D.   On: 10/23/2021 11:56   US LIVER DOPPLER  Result Date: 10/24/2021 CLINICAL DATA:  60 year old male with a history of hepatic cirrhosis and recent imaging findings concerning for hepatocellular carcinoma and portal vein occlusion. EXAM: DUPLEX ULTRASOUND OF LIVER TECHNIQUE: Color and duplex Doppler ultrasound was performed to evaluate the hepatic in-flow and out-flow vessels. COMPARISON:  CT abdomen/pelvis 10/23/2021 FINDINGS: Liver: Markedly nodular hepatic contour. Diffuse heterogeneity of the hepatic parenchyma. Discrete hepatic lesions are difficult to visualize given the heterogeneity of the background parenchyma. No biliary ductal dilatation. 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 Heterogeneously echogenic material is visualized within the vessel lumen. Hepatic Vein Velocities Right:  42 cm/sec Middle:  45 cm/sec Left:  43 cm/sec IVC: Questionable thrombus visualized within the IVC. Hepatic Artery Velocity:  46 cm/sec Splenic Vein Velocity: Occluded Spleen: 18.1 cm x 7.3 cm x 19.6 cm with a total volume of 1,352 cm^3 (411 cm^3 is upper limit normal) Portal Vein Occlusion/Thrombus: Yes Splenic Vein Occlusion/Thrombus: Yes Ascites: Present Varices: Present IMPRESSION: 1. 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 It is unclear if this represents bland thrombus or tumor thrombus. 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 2. Questionable thrombus within the inferior vena cava. If truly representative of thrombus, this would almost certainly be tumor thrombus extending through the hepatic veins into the IVC. 3. Small cirrhotic liver with heavily nodular contour. 4. Although suspected on CT imaging, discrete hepatic mass and lesions not visualized likely due to the severe heterogeneity of the background parenchyma. 5. Ascites. Recommend further evaluation with gadolinium-enhanced MRI of  the abdomen to assess for hepatocellular carcinoma and tumor thrombus within the IVC and portal veins. Electronically Signed   By: Jacqulynn Cadet M.D.   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 He presented to the hospital on 10/23/21 after a fall at home with weakness/fatigue, and acutely decompensated disease with ascites, anasarca, worsening renal function, profound leukocytosis with WBC initially 137, lactic acidosis, and CT findings concerning for possible HCC. Oncology was also consulted due to WBC count, recommended abx.   Acutely decompensated NASH cirrhosis with SBP: MELD 24 on admission, 23 today, ammonia slightly elevated this admission without signs of HE, remains relatively unchanged at 55 today. Notable ascites and lower extremity edema on exam in setting of hypoalbuminemia and noncompliance with outpatient diuretics (bumex 36m and spironolactone 2085mdaily). Paracentesis 11/7 yielding 3.6L, fluid analysis consistent with SBP (355 PMNs), gram stain w/ gram-positive cocci in clusters, currently on broad spectrum abx, started on albumin. Diuretics initially held due to AKI, however, creatnine improved to 0.94 today, down from 1.39, lactic acid improving, 2.4 yesterday down from 7.8 on 11/7. Aldactone 5049maily started yesterday, will consider increasing today as renal function has improved and patient continues to have prevalent ascites and lower extremity edema.   Elevated LFTs and Bilirubin: Likely multifactorial insetting of acute illness and new liver lesion, as well as acute decompensated cirrhosis. LFTs relatively unchanged throughout admission,Today AST 76, ALT 33, Alk phos 418, albumin 2.9, T bili 6.2 INR yesterday was 1.6. will update today. fractionated bilirubin yesterday revealed T bili 5.1, Direct 2.3 and  indirect 2.8. platelets remain at 76 K today, down from 111 on 11/7.   Liver lesions: Doppler US US liver yesterday revealed occluded portal vein with concerns for questionable thrombus, suspected possibly to be tumor thrombus given presence of lesions on CT A/P during admission. MRI Liver was done yesterday which revealed 9.5 cm infiltrating mass and additional 2.1 cm enhancing lesion, both compatible with multifocal HCC. Portal vein thrombosis without findings to suggest tumor thrombus. Oncology has been consulted and will follow with patient regarding MRI results. Notably, he has hx of esophageal varices, last EGD Dec 2020 with grade III varices s/p banding x5. AFP yesterday 63  Leukocytosis: Multifactorial in setting of SBP, sepsis and presence of malignancy, continues on broad spectrum abx with improvement. WBC 86.6 today, down from 137 on 11/7. Oncology contiues to follow.  Gastric wall thickening: Noted on CT A/P, can be evaluated at time of EGD.  Anemia: Suspect this is also multifactorial, no overt GI bleeding, likely influenced by hemodilution with IV rehydration and albumin. Hgb 9.2 today, down from 11.6 two days ago, remains relative stable since yesterday. No reported BRPRB, hematemesis or melena.   EGD discussed with patient, including risks and benefits, he is amenable to proceeding with procedure today.   Plan: -Continue empiric antibiotics, 5 days total for SBP, then prophylactically thereafter -Continue albumin for SBP, had 1.5g/kg dosing started on Monday evening, he will need 1g/kg again on Thursday. -Continue Aldactone, consider increasing to 100m97mday given improvement in renal function and continued edema -EGD this afternoon for esophageal variceal screening, remain NPO until after procedure then 2g sodium diet -continue lactulose, titrate to 3 soft BMs/day -LFTs, BMP, CBC and INR daily -monitor for overt GI bleeding -appreciate palliative care consult -consider  repeat paracentesis today   LOS: 2 days    10/25/2021, 9:07 AM  Andjela Wickes L. CarlAlver SorrowN, APRN, AGNP-C Adult-Gerontology Nurse Practitioner [facility]

## 2021-10-25 NOTE — Op Note (Signed)
[facility] Patient Name: Lawrence Rogers Procedure Date: 10/25/2021 1:45 PM MRN: [id number] Date of Birth: 1961/07/23 Attending MD: Hildred Laser , MD CSN: [id number] Age: 60 Admit Type: Inpatient Procedure:                Upper GI endoscopy Indications:              Esophageal varices, For therapy of esophageal                            varices Providers:                Hildred Laser, MD, Caprice Kluver, Randa Spike,                            Technician Referring MD:             Irwin Brakeman, MD Medicines:                Propofol per Anesthesia Complications:            No immediate complications. Estimated Blood Loss:     Estimated blood loss: none. Estimated blood loss:                            none. Procedure:                Pre-Anesthesia Assessment:                           - Prior to the procedure, a History and Physical                            was performed, and patient medications and                            allergies were reviewed. The patient's tolerance of                            previous anesthesia was also reviewed. The risks                            and benefits of the procedure and the sedation                            options and risks were discussed with the patient.                            All questions were answered, and informed consent                            was obtained. Prior Anticoagulants: The patient has                            taken no previous anticoagulant or antiplatelet                            agents. ASA Grade Assessment: IV -  A patient with                            severe systemic disease that is a constant threat                            to life. After reviewing the risks and benefits,                            the patient was deemed in satisfactory condition to                            undergo the procedure.                           After obtaining informed consent, the endoscope was                             passed under direct vision. Throughout the                            procedure, the patient's blood pressure, pulse, and                            oxygen saturations were monitored continuously. The                            [id number]-H190 ([id number]) scope was introduced through the                            mouth, and advanced to the second part of duodenum.                            The upper GI endoscopy was accomplished without                            difficulty. The patient tolerated the procedure                            well. Scope In: 2:20:01 PM Scope Out: 2:31:30 PM Total Procedure Duration: 0 hours 11 minutes 29 seconds  Findings:      The hypopharynx was normal.      The proximal esophagus was normal.      Grade III varices were found in the mid esophagus and in the distal       esophagus. Five bands were successfully placed with incomplete       eradication of varices. There was no bleeding during and at the end of       the procedure.      The Z-line was regular and was found 44 cm from the incisors.      Moderate portal hypertensive gastropathy was found in the gastric fundus       and in the gastric body.      Multiple 4 to 9 mm pedunculated and sessile polyps with no bleeding and  no stigmata of recent bleeding were found in the prepyloric region of       the stomach and at the pylorus.      The duodenal bulb and second portion of the duodenum were normal. Impression:               - No specimens collected. Moderate Sedation:      Per Anesthesia Care Recommendation:           - Return patient to hospital ward for ongoing care.                           - Full liquid diet today.                           - Continue present medications.                           - Repeat upper endoscopy in 4 weeks. Procedure Code(s):        --- Professional ---                           [phone], Esophagogastroduodenoscopy, flexible,                            transoral;  with band ligation of esophageal/gastric                            varices Diagnosis Code(s):        --- Professional ---                           I85.00, Esophageal varices without bleeding CPT copyright 2019 American Medical Association. All rights reserved. The codes documented in this report are preliminary and upon coder review may  be revised to meet current compliance requirements. Hildred Laser, MD Hildred Laser, MD 10/25/2021 2:47:55 PM This report has been signed electronically. Number of Addenda: 0

## 2021-10-25 NOTE — Transfer of Care (Signed)
Immediate Anesthesia Transfer of Care Note  Patient: Lawrence Rogers  Procedure(s) Performed: ESOPHAGOGASTRODUODENOSCOPY (EGD) WITH PROPOFOL ESOPHAGEAL BANDING  Patient Location: PACU  Anesthesia Type:General  Level of Consciousness: awake  Airway & Oxygen Therapy: Patient Spontanous Breathing  Post-op Assessment: Report given to RN  Post vital signs: Reviewed  Last Vitals:  Vitals Value Taken Time  BP 94/54 10/25/21 1438  Temp    Pulse 81 10/25/21 1442  Resp 23 10/25/21 1442  SpO2 92 % 10/25/21 1442  Vitals shown include unvalidated device data.  Last Pain:  Vitals:   10/25/21 1414  TempSrc:   PainSc: 0-No pain         Complications: No notable events documented.

## 2021-10-25 NOTE — Anesthesia Postprocedure Evaluation (Signed)
Anesthesia Post Note  Patient: Lawrence Rogers  Procedure(s) Performed: ESOPHAGOGASTRODUODENOSCOPY (EGD) WITH PROPOFOL ESOPHAGEAL BANDING  Patient location during evaluation: PACU Anesthesia Type: General Level of consciousness: sedated Pain management: pain level controlled Vital Signs Assessment: post-procedure vital signs reviewed and stable Respiratory status: spontaneous breathing Cardiovascular status: blood pressure returned to baseline and stable Postop Assessment: no apparent nausea or vomiting Anesthetic complications: no   No notable events documented.   Last Vitals:  Vitals:   10/25/21 1200 10/25/21 1325  BP: (!) 109/51 (!) 119/59  Pulse: 92 93  Resp: 19 18  Temp: 36.7 C 36.8 C  SpO2: 97% 97%    Last Pain:  Vitals:   10/25/21 1414  TempSrc:   PainSc: 0-No pain                 Shandell Jallow

## 2021-10-25 NOTE — Progress Notes (Signed)
Date and time results received: 10/25/21 0657 (use smartphrase ".now" to insert current time)  Test: WBC Critical Value: WBC 86.6  Name of Provider Notified: O. Adefeso  Orders Received? Or Actions Taken?: Actions Taken: MD on call notified

## 2021-10-26 ENCOUNTER — Inpatient Hospital Stay (HOSPITAL_COMMUNITY): Payer: Managed Care, Other (non HMO)

## 2021-10-26 ENCOUNTER — Encounter (HOSPITAL_COMMUNITY): Payer: Self-pay | Admitting: Internal Medicine

## 2021-10-26 DIAGNOSIS — C220 Liver cell carcinoma: Secondary | ICD-10-CM | POA: Diagnosis not present

## 2021-10-26 DIAGNOSIS — D72823 Leukemoid reaction: Secondary | ICD-10-CM | POA: Diagnosis not present

## 2021-10-26 DIAGNOSIS — K259 Gastric ulcer, unspecified as acute or chronic, without hemorrhage or perforation: Secondary | ICD-10-CM

## 2021-10-26 DIAGNOSIS — R609 Edema, unspecified: Secondary | ICD-10-CM | POA: Diagnosis not present

## 2021-10-26 DIAGNOSIS — K652 Spontaneous bacterial peritonitis: Secondary | ICD-10-CM | POA: Diagnosis not present

## 2021-10-26 LAB — PROTIME-INR
INR: 1.9 — ABNORMAL HIGH (ref 0.8–1.2)
Prothrombin Time: 21.7 seconds — ABNORMAL HIGH (ref 11.4–15.2)

## 2021-10-26 LAB — COMPREHENSIVE METABOLIC PANEL
ALT: 33 U/L (ref 0–44)
AST: 74 U/L — ABNORMAL HIGH (ref 15–41)
Albumin: 2.6 g/dL — ABNORMAL LOW (ref 3.5–5.0)
Alkaline Phosphatase: 437 U/L — ABNORMAL HIGH (ref 38–126)
Anion gap: 11 (ref 5–15)
BUN: 26 mg/dL — ABNORMAL HIGH (ref 6–20)
CO2: 20 mmol/L — ABNORMAL LOW (ref 22–32)
Calcium: 9.3 mg/dL (ref 8.9–10.3)
Chloride: 98 mmol/L (ref 98–111)
Creatinine, Ser: 0.75 mg/dL (ref 0.61–1.24)
GFR, Estimated: >60 mL/min (ref >60)
Glucose, Bld: 75 mg/dL (ref 70–99)
Potassium: 3.9 mmol/L (ref 3.5–5.1)
Sodium: 129 mmol/L — ABNORMAL LOW (ref 135–145)
Total Bilirubin: 7.5 mg/dL — ABNORMAL HIGH (ref 0.3–1.2)
Total Protein: 4.9 g/dL — ABNORMAL LOW (ref 6.5–8.1)

## 2021-10-26 LAB — GLUCOSE, CAPILLARY
Glucose-Capillary: 100 mg/dL — ABNORMAL HIGH (ref 70–99)
Glucose-Capillary: 190 mg/dL — ABNORMAL HIGH (ref 70–99)
Glucose-Capillary: 192 mg/dL — ABNORMAL HIGH (ref 70–99)
Glucose-Capillary: 56 mg/dL — ABNORMAL LOW (ref 70–99)
Glucose-Capillary: 75 mg/dL (ref 70–99)
Glucose-Capillary: 79 mg/dL (ref 70–99)
Glucose-Capillary: 89 mg/dL (ref 70–99)
Glucose-Capillary: 98 mg/dL (ref 70–99)

## 2021-10-26 LAB — AMMONIA: Ammonia: 43 umol/L — ABNORMAL HIGH (ref 9–35)

## 2021-10-26 MED ORDER — PIPERACILLIN-TAZOBACTAM 3.375 G IVPB
3.3750 g | Freq: Three times a day (TID) | INTRAVENOUS | Status: DC
  Filled 2021-10-26: qty 50

## 2021-10-26 MED ORDER — ALBUMIN HUMAN 25 % IV SOLN
25.0000 g | Freq: Four times a day (QID) | INTRAVENOUS | Status: DC
  Administered 2021-10-26: 25 g via INTRAVENOUS
  Filled 2021-10-26: qty 100

## 2021-10-26 MED ORDER — ALBUMIN HUMAN 25 % IV SOLN
25.0000 g | Freq: Four times a day (QID) | INTRAVENOUS | Status: AC
  Administered 2021-10-27 (×3): 25 g via INTRAVENOUS
  Filled 2021-10-26 (×3): qty 100

## 2021-10-26 MED ORDER — PIPERACILLIN-TAZOBACTAM 3.375 G IVPB
3.3750 g | Freq: Three times a day (TID) | INTRAVENOUS | Status: DC
  Administered 2021-10-26 – 2021-10-29 (×8): 3.375 g via INTRAVENOUS
  Filled 2021-10-26 (×7): qty 50

## 2021-10-26 NOTE — Progress Notes (Signed)
   10/26/21 1443  Assess: MEWS Score  Temp (!) 97.3 F (36.3 C)  BP 90/65  Pulse Rate (!) 108 (rechecked)  Resp 18  Level of Consciousness Alert  SpO2 98 %  O2 Device Room Air  Assess: MEWS Score  MEWS Temp 0  MEWS Systolic 1  MEWS Pulse 1  MEWS RR 0  MEWS LOC 0  MEWS Score 2  MEWS Score Color Yellow  Assess: if the MEWS score is Yellow or Red  Were vital signs taken at a resting state? Yes  Focused Assessment Change from prior assessment (see assessment flowsheet)  MEWS guidelines implemented *See Row Information* Yes  Take Vital Signs  Increase Vital Sign Frequency  Yellow: Q 2hr X 2 then Q 4hr X 2, if remains yellow, continue Q 4hrs  Escalate  MEWS: Escalate Yellow: discuss with charge nurse/RN and consider discussing with provider and RRT  Notify: Charge Nurse/RN  Name of Charge Nurse/RN Notified Raquel Sarna  Date Charge Nurse/RN Notified 10/26/21  Time Charge Nurse/RN Notified 1504  Notify: Provider  Provider Name/Title DR. [facility]  Date Provider Notified 10/26/21  Time Provider Notified 1507  Notification Type Page  Notification Reason Other (Comment) (yellow mews)

## 2021-10-26 NOTE — Progress Notes (Signed)
Physical Therapy Treatment Patient Details Name: Lawrence Rogers MRN: [id number] DOB: May 21, 1961 Today's Date: 10/26/2021   History of Present Illness Lawrence Rogers is a 60 y.o. male presents after fall, on the floor for several hours. CT abdomen and pelvis showed no posttraumatic deformities and signs of cirrhosis and bilateral kidney stones. 11/7 Lt sided thoracentesis successfully removed 3.6 L.  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  Presents with increased distance during gait training, pt limited by fatigue and slow cadence.  Given HEP printout for strengthening in seated position.  No reoprts of increased pain through session.  EOS pt left in chair with call bell within reach and visitor in room.    Recommendations for follow up therapy are one component of a multi-disciplinary discharge planning process, led by the attending physician.  Recommendations may be updated based on patient status, additional functional criteria and insurance authorization.  Follow Up Recommendations  Home health PT     Assistance Recommended at Discharge Frequent or constant Supervision/Assistance  Equipment Recommendations  Rolling walker (2 wheels)    Recommendations for Other Services       Precautions / Restrictions       Mobility  Bed Mobility               General bed mobility comments: Sitting in chair upon entrance    Transfers Overall transfer level: Modified independent Equipment used: Rolling walker (2 wheels) Transfers: Sit to/from Stand Sit to Stand: Min guard;Min assist Stand pivot transfers: Supervision;Min guard         General transfer comment: cueing for handplacement to assist with STS, unsteady    Ambulation/Gait Ambulation/Gait assistance: Min guard;Min assist Gait Distance (Feet): 48 Feet Assistive device: Rolling walker  (2 wheels) Gait Pattern/deviations: Step-through pattern;Decreased stride length Gait velocity: decreased     General Gait Details: slow cadence, occasional drifting, no LOB just slight unsteadiness, minimal foot clearance and equal stride length, limited by fatigue.   Stairs             Wheelchair Mobility    Modified Rankin (Stroke Patients Only)       Balance                                            Cognition Arousal/Alertness: Awake/alert Behavior During Therapy: WFL for tasks assessed/performed Overall Cognitive Status: Within Functional Limits for tasks assessed                                          Exercises General Exercises - Lower Extremity Ankle Circles/Pumps: AROM;Both;10 reps Long Arc Quad: AROM;Strengthening;Both;10 reps Hip Flexion/Marching: AROM;Both;10 reps Toe Raises: 10 reps;Seated Heel Raises: 10 reps;Seated    General Comments        Pertinent Vitals/Pain Pain Assessment: 0-10 Pain Score: 5  Pain Location: low back while supine Pain Descriptors / Indicators: Aching;Sore Pain Intervention(s): Limited activity within patient's tolerance;Monitored during session;Repositioned    Home Living                          Prior Function  PT Goals (current goals can now be found in the care plan section)      Frequency    Min 3X/week      PT Plan      Co-evaluation              AM-PAC PT "6 Clicks" Mobility   Outcome Measure  Help needed turning from your back to your side while in a flat bed without using bedrails?: None Help needed moving from lying on your back to sitting on the side of a flat bed without using bedrails?: None Help needed moving to and from a bed to a chair (including a wheelchair)?: A Little Help needed standing up from a chair using your arms (e.g., wheelchair or bedside chair)?: A Little Help needed to walk in hospital room?: A  Little Help needed climbing 3-5 steps with a railing? : A Lot 6 Click Score: 19    End of Session Equipment Utilized During Treatment: Gait belt Activity Tolerance: Patient tolerated treatment well;Patient limited by fatigue Patient left: in chair;with call bell/phone within reach;with family/visitor present Nurse Communication: Mobility status PT Visit Diagnosis: Unsteadiness on feet (R26.81);Other abnormalities of gait and mobility (R26.89);Muscle weakness (generalized) (M62.81)     Time: 1120-1150 PT Time Calculation (min) (ACUTE ONLY): 30 min  Charges:  $Therapeutic Exercise: 8-22 mins $Therapeutic Activity: 8-22 mins                     Ihor Austin, LPTA/CLT; CBIS [phone]  Aldona Lento 10/26/2021, 1:10 PM

## 2021-10-26 NOTE — TOC Initial Note (Signed)
Transition of Care [facility]) - Initial/Assessment Note    Patient Details  Name: Lawrence Rogers MRN: [id number] Date of Birth: [birth year]  Transition of Care [facility] CM/SW Contact:    Natasha Bence, LCSW Phone Number: 10/26/2021, 2:59 PM  Clinical Narrative:                 Patient is a 60 year old male admitted for atient agreeable to [facility] referral. Patient agreeable to OP with [facility]. CSW referred patient to [place]. Patient also agreeable to [facility]. CSW referred patient to [place] with Alvis Lemmings. Georgina Snell agreeable to provide [employer] services to patient. TOC to follow.  Expected Discharge Plan: [place] Barriers to Discharge: Continued Medical Work up   Patient Goals and CMS Choice Patient states their goals for this hospitalization and ongoing recovery are:: Return home with [facility] CMS Medicare.gov Compare Post Acute Care list provided to:: Patient Choice offered to / list presented to : Patient  Expected Discharge Plan and Services Expected Discharge Plan: [place] Arranged: RN, PT, Nurse's Aide [facility] Agency: [place] Date [facility] Agency Contacted: 10/26/21 Time HH Agency Contacted: [phone]    Prior Living Arrangements/Services   Lives with:: Self Patient language and need for interpreter reviewed:: Yes Do you feel safe going back to the place where you live?: Yes      Need for Family Participation in Patient Care: Yes (Comment) Care giver support system in place?: Yes (comment)   Criminal Activity/Legal Involvement Pertinent to Current Situation/Hospitalization: No - Comment as needed  Activities of Daily Living Home Assistive Devices/Equipment: Cane (specify quad or straight), CBG Meter ADL Screening (condition at time of admission) Patient's cognitive ability adequate to safely complete daily activities?: Yes Is the patient deaf or have difficulty hearing?: No Does the patient have difficulty  seeing, even when wearing glasses/contacts?: No Does the patient have difficulty concentrating, remembering, or making decisions?: No Patient able to express need for assistance with ADLs?: Yes Does the patient have difficulty dressing or bathing?: No Independently performs ADLs?: Yes (appropriate for developmental age) Does the patient have difficulty walking or climbing stairs?: Yes Weakness of Legs: Both Weakness of Arms/Hands: None  Permission Sought/Granted Permission sought to share information with : Family Supports Permission granted to share information with : Yes, Verbal Permission Granted  Share Information with NAME: Benay Pillow  Permission granted to share info w AGENCY: Local HH  Permission granted to share info w Relationship: Friend  Permission granted to share info w Contact Information: [phone]  Emotional Assessment     Affect (typically observed): Accepting Orientation: : Oriented to Self, Oriented to Situation, Oriented to Place, Oriented to  Time Alcohol / Substance Use: Not Applicable Psych Involvement: No (comment)  Admission diagnosis:  Cirrhosis of liver (HCC) [K74.60] Other ascites [R18.8] Ascites [R18.8] Fall, initial encounter [W19.XXXA] Cirrhosis of liver with ascites ([place]) [K74.60, R18.8] Patient Active Problem List   Diagnosis Date Noted   Hepatocellular carcinoma ([place])    Protein-calorie malnutrition, severe 10/24/2021   Ascites    SBP (spontaneous bacterial peritonitis) ([place])    Liver mass    Hyperammonemia (HCC)    Peripheral edema    Leukemoid reaction    Encounter for orthopedic follow-up care 07/18/2021   Full  thickness rotator cuff tear 07/05/2021   Chest pain 06/16/2021   Pain in joint of right shoulder 06/01/2021   Pain in joint of right elbow 05/17/2021   Empyema lung ([place]) 07/20/2019   Acute respiratory failure with hypoxia ([place]) 07/20/2019   Empyema ([place]) 07/20/2019   Acute respiratory distress 07/19/2019   Pleural  effusion 07/19/2019   Gastric ulcer 09/22/2018   Cirrhosis of liver ([place]) 08/07/2018   Diabetes ([place])    Cirrhosis of liver with ascites ([place])    PCP:  [name], MD Pharmacy:   [place], [address] [place] [address] Phone: [phone] Fax: [phone]     Social Determinants of Health (SDOH) Interventions    Readmission Risk Interventions No flowsheet data found.

## 2021-10-26 NOTE — Progress Notes (Addendum)
Subjective:  Patient feels uncomfortable with abdominal distention and lower extremity edema. No abdominal pain. No N/V. Feels hungry and does not like full liquid diet. Wants some crackers or something solid. No BM in few days. No N/V.   Objective: Vital signs in last 24 hours: Temp:  [97.8 F (36.6 C)-98.6 F (37 C)] 98.4 F (36.9 C) (11/10 0422) Pulse Rate:  [75-96] 96 (11/10 0422) Resp:  [16-20] 18 (11/10 0422) BP: (90-119)/(48-59) 115/48 (11/10 0422) SpO2:  [93 %-100 %] 94 % (11/10 0422) Weight:  [95 kg-101.4 kg] 101.4 kg (11/10 0500) Last BM Date: 10/23/21 General:   Alert,  Well-developed, well-nourished, pleasant and cooperative in NAD. Examined in the recliner chair. Head:  Normocephalic and atraumatic. Eyes:  Sclera clear, no icterus.  Chest: CTA bilaterally without rales, rhonchi, crackles.    Heart:  Regular rate and rhythm; no murmurs, clicks, rubs,  or gallops. Abdomen:  Soft, distended but not tense, nontender. Normal bowel sounds, without guarding, and without rebound.   Extremities:  Without clubbing, deformity. 3+bilateral edema to knees, worse mid-calf and distal. Neurologic:  Alert and  oriented x4;  grossly normal neurologically. Skin:  Intact without significant lesions or rashes. Psych:  Alert and cooperative. Normal mood and affect.  Intake/Output from previous day: 11/09 0701 - 11/10 0700 In: 1219.4 [P.O.:720; I.V.:499.4] Out: 1100 [Urine:1100] Intake/Output this shift: No intake/output data recorded.  Lab Results: CBC Recent Labs    10/24/21 0517 10/25/21 0506 10/26/21 0521  WBC 100.3* 86.6* 101.8*  HGB 9.4* 9.2* 9.9*  HCT 28.9* 27.0* 30.6*  MCV 89.8 87.1 89.7  PLT 76* 76* 78*   BMET Recent Labs    10/24/21 0517 10/25/21 0506 10/26/21 0521  NA 129* 131* 129*  K 3.9 3.3* 3.9  CL 96* 95* 98  CO2 26 23 20*  GLUCOSE 47* 50* 75  BUN 37* 32* 26*  CREATININE 1.18 0.94 0.75  CALCIUM 8.6* 9.2 9.3   LFTs Recent Labs    10/24/21 0517  10/25/21 0506 10/26/21 0521  BILITOT 5.1*  5.4* 6.2* 7.5*  BILIDIR 2.3*  --   --   IBILI 2.8*  --   --   ALKPHOS 386* 418* 437*  AST 65* 76* 74*  ALT 29 33 33  PROT 4.7* 5.2* 4.9*  ALBUMIN 2.3* 2.9* 2.6*   No results for input(s): LIPASE in the last 72 hours. PT/INR Recent Labs    10/24/21 0517 10/25/21 1240 10/26/21 0521  LABPROT 19.4* 20.0* 21.7*  INR 1.6* 1.7* 1.9*      Imaging Studies: CT ABDOMEN PELVIS WO CONTRAST  Result Date: 10/23/2021 CLINICAL DATA:  Abdominal distension. Fall. Low back pain. Cirrhosis. Diabetes. EXAM: CT ABDOMEN AND PELVIS WITHOUT CONTRAST TECHNIQUE: Multidetector CT imaging of the abdomen and pelvis was performed following the standard protocol without IV contrast. COMPARISON:  05/20/2020 abdominal ultrasound. 10/07/2018 abdominopelvic CT. FINDINGS: Lower chest: right greater than left dependent lower lobe airspace disease. A 3 mm lingular nodule on 06/05 is not readily apparent on the prior. Small right pleural effusion is new. Normal heart size. Bilateral gynecomastia. Marked periesophageal varices. Hepatobiliary: Subtle hypoattenuating right hepatic lobe mass including at 8.2 x 8.8 cm, suboptimally evaluated on this noncontrast CT. Example 19/3. Likely separate more inferior right hepatic lobe hypoattenuating lesion of 2.2 cm on 30/3. Normal gallbladder, without biliary ductal dilatation. Pancreas: Pancreatic atrophy, without duct dilatation. Spleen: Splenomegaly, including at 18.4 cm craniocaudal. Adrenals/Urinary Tract: Normal adrenal glands. Bilateral renal collecting system calculi, maximally 1.0 cm in the  interpolar left kidney. No hydronephrosis. No hydroureter or ureteric calculi. No bladder calculi. Stomach/Bowel: The gastric body is underdistended but appears thick walled on 22/3. The colon is underdistended and poorly evaluated. Normal small bowel caliber. Vascular/Lymphatic: Aortic atherosclerosis. Extensive perigastric varices. Small abdominal  retroperitoneal nodes are likely reactive in the setting of cirrhosis. Index precaval 10 mm node on 38/3 is similar. No pelvic sidewall adenopathy. Reproductive: Normal prostate. Other: Moderate volume abdominopelvic ascites. No free intraperitoneal air. Musculoskeletal: No acute osseous abnormality. IMPRESSION: 1. No posttraumatic deformity identified. 2. Cirrhosis and portal venous hypertension with moderate volume ascites. Hypoattenuating liver lesions, suspicious for multifocal hepatocellular carcinoma. When the patient is clinically stable and able to follow directions and hold their breath (preferably as an outpatient) further evaluation with dedicated abdominal MRI should be considered. 3. Right pleural effusion with bibasilar airspace disease. Most likely atelectasis. At the right lung base, infection cannot be excluded. 4. Bilateral nephrolithiasis. 5. Gastric wall thickening in the setting of underdistention. Recommend clinical exclusion of gastritis. Hypoalbuminemia could create a similar appearance. 6. 3 mm lingular nodule. No follow-up needed if patient is low-risk. Non-contrast chest CT can be considered in 12 months if patient is high-risk. This recommendation follows the consensus statement: Guidelines for Management of Incidental Pulmonary Nodules Detected on CT Images: From the Fleischner Society 2017; Radiology 2017; 284:228-243. 7. Bilateral gynecomastia. These results were called by telephone at the time of interpretation on 10/23/2021 at 12:37 pm to provider [facility] ZAMMIT , who verbally acknowledged these results. Electronically Signed   By: Abigail Miyamoto M.D.   On: 10/23/2021 12:38   MR LIVER W WO CONTRAST  Result Date: 10/24/2021 CLINICAL DATA:  Cirrhosis, new liver lesions on CT, occlusion of main and intrahepatic portal veins EXAM: MRI ABDOMEN WITHOUT AND WITH CONTRAST TECHNIQUE: Multiplanar multisequence MR imaging of the abdomen was performed both before and after the administration of  intravenous contrast. CONTRAST:  34m GADAVIST GADOBUTROL 1 MMOL/ML IV SOLN COMPARISON:  CT abdomen/pelvis dated 10/23/2021 FINDINGS: Motion degraded images. Lower chest: Moderate right and small left pleural effusions. Associated bilateral lower lobe opacities, likely atelectasis. Hepatobiliary: Cirrhosis. 9.5 cm infiltrating enhancing mass in the posterior right hepatic dome, centered in segment 7 (series 6/image 15), compatible with hepatocellular carcinoma (LI-RADS 5). Additional 2.1 cm enhancing lesion in segment 5 (series 14/image 51), also compatible with HCC (LI-RADS 5). Gallbladder is underdistended with mild gallbladder wall thickening/edema, likely secondary to venous congestion/ascites. No cholelithiasis. No intrahepatic or extrahepatic ductal dilatation. Pancreas:  Within normal limits. Spleen:  Enlarged, measuring 20.2 cm in craniocaudal dimension. Adrenals/Urinary Tract:  Adrenal glands are within normal limits. 1.6 cm cyst with layering hemorrhage in the posterior right upper kidney (series 4/image 30), benign (Bosniak II). Left kidney is within normal limits. No hydronephrosis. Stomach/Bowel: Stomach is within normal limits. Visualized bowel is grossly unremarkable. Vascular/Lymphatic:  No evidence of abdominal aortic aneurysm. Main portal vein thrombosis (series 18/image 45), extending into the left and right portal veins (series 201/images 34 and 42). No associated findings to suggest tumor thrombus. Gastroesophageal varices. Other: Small to moderate abdominal ascites, predominantly perihepatic. Musculoskeletal: No focal osseous lesions. 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 No associated findings to suggest tumor thrombus. Cirrhosis. Splenomegaly. Gastroesophageal varices. Small to moderate abdominal ascites. Moderate right and small left pleural effusions.  Electronically Signed   By: SJulian HyM.D.   On: 10/24/2021  20:27   US Paracentesis  Result Date: 10/23/2021 INDICATION: Cirrhosis, ascites EXAM: ULTRASOUND GUIDED DIAGNOSTIC AND THERAPEUTIC PARACENTESIS MEDICATIONS: None. COMPLICATIONS: None immediate. PROCEDURE: Informed written consent was obtained from the patient after a discussion of the risks, benefits and alternatives to treatment. A timeout was performed prior to the initiation of the procedure. Initial ultrasound scanning demonstrates a large amount of ascites within the LEFT lower abdominal quadrant. The right lower abdomen was prepped and draped in the usual sterile fashion. 1% lidocaine was used for local anesthesia. Following this, a 5 [place] Yueh catheter was introduced. An ultrasound image was saved for documentation purposes. The paracentesis was performed. The catheter was removed and a dressing was applied. The patient tolerated the procedure well without immediate post procedural complication. FINDINGS: A total of approximately 3.6 L of yellow ascitic fluid was removed. Samples were sent to the laboratory as requested by the clinical team. IMPRESSION: Successful ultrasound-guided paracentesis yielding 3.6 liters of peritoneal fluid. Electronically Signed   By: Lavonia Dana M.D.   On: 10/23/2021 15:00   DG Chest Port 1 View  Result Date: 10/23/2021 CLINICAL DATA:  Weakness EXAM: PORTABLE CHEST 1 VIEW COMPARISON:  Chest x-ray 09/22/2021 FINDINGS: Heart size is upper normal. Mediastinum appears stable. Mild calcified plaques in the aortic arch. Mild pulmonary vascular/interstitial prominence. Small left and moderate right pleural effusion with associated atelectasis/infiltrate. No pneumothorax. IMPRESSION: Bilateral pleural effusions right greater than left. Mild pulmonary vascular prominence. Electronically Signed   By: Ofilia Neas M.D.   On: 10/23/2021 11:56   US LIVER DOPPLER  Result Date: 10/24/2021 CLINICAL DATA:   59 year old male with a history of hepatic cirrhosis and recent imaging findings concerning for hepatocellular carcinoma and portal vein occlusion. EXAM: DUPLEX ULTRASOUND OF LIVER TECHNIQUE: Color and duplex Doppler ultrasound was performed to evaluate the hepatic in-flow and out-flow vessels. COMPARISON:  CT abdomen/pelvis 10/23/2021 FINDINGS: Liver: Markedly nodular hepatic contour. Diffuse heterogeneity of the hepatic parenchyma. Discrete hepatic lesions are difficult to visualize given the heterogeneity of the background parenchyma. No biliary ductal dilatation. 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 Heterogeneously echogenic material is visualized within the vessel lumen. Hepatic Vein Velocities Right:  42 cm/sec Middle:  45 cm/sec Left:  43 cm/sec IVC: Questionable thrombus visualized within the IVC. Hepatic Artery Velocity:  46 cm/sec Splenic Vein Velocity: Occluded Spleen: 18.1 cm x 7.3 cm x 19.6 cm with a total volume of 1,352 cm^3 (411 cm^3 is upper limit normal) Portal Vein Occlusion/Thrombus: Yes Splenic Vein Occlusion/Thrombus: Yes Ascites: Present Varices: Present IMPRESSION: 1. 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 It is unclear if this represents bland thrombus or tumor thrombus. 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 2. Questionable thrombus within the inferior vena cava. If truly representative of thrombus, this would almost certainly be tumor thrombus extending through the hepatic veins into the IVC. 3. Small cirrhotic liver with heavily nodular contour. 4. Although suspected on CT imaging, discrete hepatic mass and lesions not visualized likely due to the severe heterogeneity of the background parenchyma. 5. Ascites. Recommend further evaluation with gadolinium-enhanced MRI of the abdomen to assess for hepatocellular carcinoma and tumor thrombus within the IVC and portal veins. Electronically Signed   By: Jacqulynn Cadet M.D.   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 He presented to the hospital on 10/23/21 after a fall at home with weakness/fatigue, and acutely decompensated disease with ascites, anasarca, worsening renal function, profound leukocytosis with WBC initially 137, lactic acidosis, and CT findings concerning for possible HCC. Oncology was also consulted due to WBC count, recommended abx.   Newly decompensated NASH cirrhosis with SBP/Anasraca: MELD 24 on admission. MELD 26 today with climbing Tbili and PT/INR.  No signs of hepatic encephalopathy.  Notable ascites and lower extremity edema.  Noncompliant with diuretics as an outpatient (previously Bumex 3 mg and spironolactone 200 mg) as he has not felt well.  Paracentesis on 11-7 yielding 3.6 L of fluid, cell count consistent with SBP with 355 PMNs, gram stain from right antecubital blood culture with gram-positive cocci in clusters on gram stain but cultures still negative. Body fluid culture no growth. Currently on broad-spectrum antibiotic therapy.  Started on albumin.  Diuretics initially held in the setting of elevated creatinine on admission, currently on spironolactone 50 mg daily.  Weight 222.66 pounds on admission--> 209.44 pounds yesterday--> 23.55 pounds today.  Liver lesions: MRI with 9.5 cm infiltrating mass and additional 2.1 cm enhancing lesion, both compatible with multifocal HCC.  AFP 9.3 (07/2021) and currently 64.3. Oncology following.  Dr. Laural Golden discussed earlier with interventional radiologist, Dr. Aletta Edouard, he did not feel patient is a candidate for transarterial chemoembolization (TACE). Will await further input from oncology, Dr. Delton Coombes regarding possible treatment plan.  Portal vein thrombosis: Ultrasound Doppler showed complete occlusion of the main and  intrahepatic portal veins, unclear if represents bland thrombus or tumor thrombus.  Questionable thrombus within the IVC.  Splenic vein occlusion.  Management per hematology.  MRI with main portal vein thrombosis extending into the left and right portal veins, no associated findings to suggest tumor thrombus. Discussed with Dr. Delton Coombes, given patient's Child Pugh Class C disease, he is not a candidate for DOACs.   Lower extremity edema:  patient reports leg swelling on the right side chronic and recently left leg became swollen. Both equally swollen at this time. Likely related hypoalbuminemia/cirrhosis +/- underlying clot.   Leukocytosis: suspected leukemoid reaction. Management per hematology.  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  Moderate portal hypertensive gastropathy.  Multiple 4 to 9 mm pedunculated and sessile polyps with no bleeding in the prepyloric region and of the pylorus.    Plan: Repeat EGD in four weeks.  Continue antibiotics for SBP, will require indefinite prophylaxis. Continue albumin for SBP, 1101m over next 24 hours. Increase Aldactone to 100 mg daily. Continue lactulose, titrate to 3 soft BMs daily. Reassess in a.m. for possible repeat paracentesis tomorrow predominantly for therapeutic purposes. Await further input from Dr. KDelton Coombes Consider dopplers LE to assess for clots.   LLaureen Ochs LBernarda CaffeyRPiedmont Healthcare PaGastroenterology Associates [id number] PM    LOS: 3 days

## 2021-10-26 NOTE — Progress Notes (Signed)
Hypoglycemic Event  CBG: 56  Treatment: 4 oz juice/soda  Symptoms: None  Follow-up CBG: [id number] CBG Result:75  Possible Reasons for Event: Inadequate meal intake  Comments/MD notified:    Augustin Coupe

## 2021-10-26 NOTE — Consult Note (Signed)
[facility] Oncology Progress Note  Name: Lawrence Rogers      MRN: [id number]    Location: S568/L275-17  Date: 10/26/2021 Time:6:41 PM   Subjective: Interval History:Lawrence Rogers was seen this evening.  He was enjoying his dinner.  He reports that he is not so comfortable lying down in the bed.  He prefers the recliner as it is pain-free.  Objective: Vital signs in last 24 hours: Temp:  [97.3 F (36.3 C)-98.4 F (36.9 C)] 97.3 F (36.3 C) (11/10 1443) Pulse Rate:  [92-108] 108 (11/10 1443) Resp:  [18] 18 (11/10 1443) BP: (90-115)/(48-65) 90/65 (11/10 1443) SpO2:  [94 %-98 %] 98 % (11/10 1443) Weight:  [223 lb 8.7 oz (101.4 kg)] 223 lb 8.7 oz (101.4 kg) (11/10 0500)    Intake/Output from previous day: 11/09 0800 - 11/10 0759 In: 1219.4 [P.O.:720; I.V.:499.4] Out: 1100 [Urine:1100]    Intake/Output this shift: Total I/O In: 1529.2 [P.O.:480; IV Piggyback:1049.2] Out: -    PHYSICAL EXAM: BP 90/65 (BP Location: Right Arm)   Pulse (!) 108 Comment: rechecked  Temp (!) 97.3 F (36.3 C)   Resp 18   Ht 6' 2"  (1.88 m)   Wt 223 lb 8.7 oz (101.4 kg)   SpO2 98%   BMI 28.70 kg/m  General appearance: alert, cooperative, and appears stated age Lungs:  Bilateral air entry. Heart: regular rate and rhythm Abdomen:  Soft, distended with no tenderness or masses. Extremities:  3+ edema. Neurologic: Grossly normal   Studies/Results: Results for orders placed or performed during the hospital encounter of 10/23/21 (from the past 48 hour(s))  Glucose, capillary     Status: Abnormal   Collection Time: 10/24/21  8:08 PM  Result Value Ref Range   Glucose-Capillary 106 (H) 70 - 99 mg/dL    Comment: Glucose reference range applies only to samples taken after fasting for at least 8 hours.  Glucose, capillary     Status: None   Collection Time: 10/25/21 12:10 AM  Result Value Ref Range   Glucose-Capillary 71 70 - 99 mg/dL    Comment: Glucose reference range applies only to  samples taken after fasting for at least 8 hours.  Glucose, capillary     Status: None   Collection Time: 10/25/21  4:01 AM  Result Value Ref Range   Glucose-Capillary 84 70 - 99 mg/dL    Comment: Glucose reference range applies only to samples taken after fasting for at least 8 hours.  Procalcitonin     Status: None   Collection Time: 10/25/21  5:06 AM  Result Value Ref Range   Procalcitonin 4.22 ng/mL    Comment:        Interpretation: PCT > 2 ng/mL: Systemic infection (sepsis) is likely, unless other causes are known. (NOTE)       Sepsis PCT Algorithm           Lower Respiratory Tract                                      Infection PCT Algorithm    ----------------------------     ----------------------------         PCT < 0.25 ng/mL                PCT < 0.10 ng/mL          Strongly encourage  Strongly discourage   discontinuation of antibiotics    initiation of antibiotics    ----------------------------     -----------------------------       PCT 0.25 - 0.50 ng/mL            PCT 0.10 - 0.25 ng/mL               OR       >80% decrease in PCT            Discourage initiation of                                            antibiotics      Encourage discontinuation           of antibiotics    ----------------------------     -----------------------------         PCT >= 0.50 ng/mL              PCT 0.26 - 0.50 ng/mL               AND       <80% decrease in PCT              Encourage initiation of                                             antibiotics       Encourage continuation           of antibiotics    ----------------------------     -----------------------------        PCT >= 0.50 ng/mL                  PCT > 0.50 ng/mL               AND         increase in PCT                  Strongly encourage                                      initiation of antibiotics    Strongly encourage escalation           of antibiotics                                      -----------------------------                                           PCT <= 0.25 ng/mL                                                 OR                                        >  80% decrease in PCT                                      Discontinue / Do not initiate                                             antibiotics  Performed at [facility], [address] [place], [place] [address]   Comprehensive metabolic panel     Status: Abnormal   Collection Time: 10/25/21  5:06 AM  Result Value Ref Range   Sodium 131 (L) 135 - 145 mmol/L   Potassium 3.3 (L) 3.5 - 5.1 mmol/L   Chloride 95 (L) 98 - 111 mmol/L   CO2 23 22 - 32 mmol/L   Glucose, Bld 50 (L) 70 - 99 mg/dL    Comment: Glucose reference range applies only to samples taken after fasting for at least 8 hours.   BUN 32 (H) 6 - 20 mg/dL   Creatinine, Ser 0.94 0.61 - 1.24 mg/dL   Calcium 9.2 8.9 - 10.3 mg/dL   Total Protein 5.2 (L) 6.5 - 8.1 g/dL   Albumin 2.9 (L) 3.5 - 5.0 g/dL   AST 76 (H) 15 - 41 U/L   ALT 33 0 - 44 U/L   Alkaline Phosphatase 418 (H) 38 - 126 U/L   Total Bilirubin 6.2 (H) 0.3 - 1.2 mg/dL   GFR, Estimated >60 >60 mL/min    Comment: (NOTE) Calculated using the CKD-EPI Creatinine Equation (2021)    Anion gap 13 5 - 15    Comment: Performed at [facility], [address] [place], [place] [address]  Magnesium     Status: None   Collection Time: 10/25/21  5:06 AM  Result Value Ref Range   Magnesium 2.4 1.7 - 2.4 mg/dL    Comment: Performed at [facility], [address], [place],  [address]  CBC with Differential/Platelet     Status: Abnormal   Collection Time: 10/25/21  5:06 AM  Result Value Ref Range   WBC 86.6 (HH) 4.0 - 10.5 K/uL    Comment: CRITICAL VALUE NOTED.  VALUE IS CONSISTENT WITH PREVIOUSLY REPORTED AND CALLED VALUE. THIS CRITICAL RESULT HAS VERIFIED AND BEEN CALLED TO [facility] BY BOBBIE MATTHEWS ON 11 09 2022 AT 2585, AND HAS BEEN READ BACK.     RBC 3.10 (L) 4.22 - 5.81 MIL/uL    Hemoglobin 9.2 (L) 13.0 - 17.0 g/dL   HCT 27.0 (L) 39.0 - 52.0 %   MCV 87.1 80.0 - 100.0 fL   MCH 29.7 26.0 - 34.0 pg   MCHC 34.1 30.0 - 36.0 g/dL   RDW 20.9 (H) 11.5 - 15.5 %   Platelets 76 (L) 150 - 400 K/uL    Comment: SPECIMEN CHECKED FOR CLOTS Immature Platelet Fraction may be clinically indicated, consider ordering this additional test [id number] CONSISTENT WITH PREVIOUS RESULT    nRBC 0.0 0.0 - 0.2 %   Neutrophils Relative % 88 %   Neutro Abs 78.8 (H) 1.7 - 7.7 K/uL   Band Neutrophils 3 %   Lymphocytes Relative 4 %   Lymphs Abs 3.5 0.7 - 4.0 K/uL   Monocytes Relative 0 %   Monocytes Absolute 0.0 (L) 0.1 - 1.0 K/uL   Eosinophils Relative 1 %  Eosinophils Absolute 0.9 (H) 0.0 - 0.5 K/uL   Basophils Relative 0 %   Basophils Absolute 0.0 0.0 - 0.1 K/uL   WBC Morphology MILD LEFT SHIFT (1-5% METAS, OCC MYELO, OCC BANDS)     Comment: TOXIC GRANULATION VACUOLATED NEUTROPHILS    Smear Review MORPHOLOGY UNREMARKABLE     Comment: PLATELET COUNT CONFIRMED BY SMEAR   Metamyelocytes Relative 2 %   Myelocytes 2 %   Dohle Bodies PRESENT    Burr Cells PRESENT    Polychromasia PRESENT    Ovalocytes PRESENT     Comment: Performed at [facility], [address], [place], [place] [address]  Lactic acid, plasma     Status: Abnormal   Collection Time: 10/25/21  5:06 AM  Result Value Ref Range   Lactic Acid, Venous 2.7 (HH) 0.5 - 1.9 mmol/L    Comment: CRITICAL VALUE NOTED.  VALUE IS CONSISTENT WITH PREVIOUSLY REPORTED AND CALLED VALUE. Performed at [facility], [address], [place], [place] [address]   Ammonia     Status: Abnormal   Collection Time: 10/25/21  5:06 AM  Result Value Ref Range   Ammonia 55 (H) 9 - 35 umol/L    Comment: Performed at [facility], [address] [place], [place] [address]  Glucose, capillary     Status: None   Collection Time: 10/25/21  7:46 AM  Result Value Ref Range   Glucose-Capillary 76 70 - 99 mg/dL    Comment: Glucose reference range  applies only to samples taken after fasting for at least 8 hours.  Type and screen [facility]     Status: None (Preliminary result)   Collection Time: 10/25/21 10:58 AM  Result Value Ref Range   ABO/RH(D) O POS    Antibody Screen NEG    Sample Expiration 10/28/2021,2359    Unit Number [id number]    Blood Component Type RED CELLS,LR    Unit division 00    Status of Unit ALLOCATED    Transfusion Status OK TO TRANSFUSE    Crossmatch Result      Compatible Performed at [facility], [address], [place], [place] [address]   Prepare RBC (crossmatch)     Status: None   Collection Time: 10/25/21 11:04 AM  Result Value Ref Range   Order Confirmation      ORDER PROCESSED BY BLOOD BANK Performed at [facility], [address] [place], [place] [address]   Glucose, capillary     Status: None   Collection Time: 10/25/21 12:03 PM  Result Value Ref Range   Glucose-Capillary 78 70 - 99 mg/dL    Comment: Glucose reference range applies only to samples taken after fasting for at least 8 hours.  Protime-INR     Status: Abnormal   Collection Time: 10/25/21 12:40 PM  Result Value Ref Range   Prothrombin Time 20.0 (H) 11.4 - 15.2 seconds   INR 1.7 (H) 0.8 - 1.2    Comment: (NOTE) INR goal varies based on device and disease states. Performed at [facility], [address], [place], [place] [address]   Glucose, capillary     Status: Abnormal   Collection Time: 10/25/21  1:31 PM  Result Value Ref Range   Glucose-Capillary 69 (L) 70 - 99 mg/dL    Comment: Glucose reference range applies only to samples taken after fasting for at least 8 hours.  Glucose, capillary     Status: Abnormal   Collection Time: 10/25/21  1:58 PM  Result Value  Ref Range   Glucose-Capillary 142 (H) 70 - 99 mg/dL    Comment: Glucose reference range applies only to samples taken after fasting for at least 8 hours.  Glucose, capillary     Status: Abnormal   Collection Time: 10/25/21  5:13 PM  Result Value Ref  Range   Glucose-Capillary 125 (H) 70 - 99 mg/dL    Comment: Glucose reference range applies only to samples taken after fasting for at least 8 hours.  Glucose, capillary     Status: None   Collection Time: 10/25/21  9:06 PM  Result Value Ref Range   Glucose-Capillary 92 70 - 99 mg/dL    Comment: Glucose reference range applies only to samples taken after fasting for at least 8 hours.  Glucose, capillary     Status: Abnormal   Collection Time: 10/26/21  1:23 AM  Result Value Ref Range   Glucose-Capillary 56 (L) 70 - 99 mg/dL    Comment: Glucose reference range applies only to samples taken after fasting for at least 8 hours.  Glucose, capillary     Status: None   Collection Time: 10/26/21  1:48 AM  Result Value Ref Range   Glucose-Capillary 75 70 - 99 mg/dL    Comment: Glucose reference range applies only to samples taken after fasting for at least 8 hours.  Glucose, capillary     Status: None   Collection Time: 10/26/21  4:21 AM  Result Value Ref Range   Glucose-Capillary 98 70 - 99 mg/dL    Comment: Glucose reference range applies only to samples taken after fasting for at least 8 hours.  CBC with Differential/Platelet     Status: Abnormal   Collection Time: 10/26/21  5:21 AM  Result Value Ref Range   WBC 101.8 (HH) 4.0 - 10.5 K/uL    Comment: CRITICAL VALUE NOTED.  VALUE IS CONSISTENT WITH PREVIOUSLY REPORTED AND CALLED VALUE. THIS CRITICAL RESULT HAS VERIFIED AND BEEN CALLED TO [facility] BY BOBBIE MATTHEWS ON 11 10 2022 AT 0721, AND HAS BEEN READ BACK.  THIS CRITICAL RESULT HAS VERIFIED AND BEEN CALLED TO [facility] BY BOBBIE MATTHEWS ON 11 10 2022 AT 0721, AND HAS BEEN READ BACK.     RBC 3.41 (L) 4.22 - 5.81 MIL/uL   Hemoglobin 9.9 (L) 13.0 - 17.0 g/dL   HCT 30.6 (L) 39.0 - 52.0 %   MCV 89.7 80.0 - 100.0 fL   MCH 29.0 26.0 - 34.0 pg   MCHC 32.4 30.0 - 36.0 g/dL   RDW 21.5 (H) 11.5 - 15.5 %   Platelets 78 (L) 150 - 400 K/uL    Comment: Immature Platelet Fraction may  be clinically indicated, consider ordering this additional test [id number]    nRBC 0.0 0.0 - 0.2 %   Neutrophils Relative % 90 %   Neutro Abs 94.7 (H) 1.7 - 7.7 K/uL   Band Neutrophils 3 %   Lymphocytes Relative 1 %   Lymphs Abs 1.0 0.7 - 4.0 K/uL   Monocytes Relative 2 %   Monocytes Absolute 2.0 (H) 0.1 - 1.0 K/uL   Eosinophils Relative 0 %   Eosinophils Absolute 0.0 0.0 - 0.5 K/uL   Basophils Relative 0 %   Basophils Absolute 0.0 0.0 - 0.1 K/uL   WBC Morphology DOHLE BODIES     Comment: HYPERSEGMENTED NEUT MILD LEFT SHIFT (1-5% METAS, OCC MYELO, OCC BANDS) TOXIC GRANULATION VACUOLATED NEUTROPHILS    RBC Morphology RARE NUCLEATED RBC PRESENT    Myelocytes 4 %  Dohle Bodies PRESENT    Hypersegmented Neutrophils PRESENT    Burr Cells PRESENT    Polychromasia PRESENT    Ovalocytes PRESENT     Comment: Performed at [facility], [address], [place], [place] [address]  Comprehensive metabolic panel     Status: Abnormal   Collection Time: 10/26/21  5:21 AM  Result Value Ref Range   Sodium 129 (L) 135 - 145 mmol/L   Potassium 3.9 3.5 - 5.1 mmol/L   Chloride 98 98 - 111 mmol/L   CO2 20 (L) 22 - 32 mmol/L   Glucose, Bld 75 70 - 99 mg/dL    Comment: Glucose reference range applies only to samples taken after fasting for at least 8 hours.   BUN 26 (H) 6 - 20 mg/dL   Creatinine, Ser 0.75 0.61 - 1.24 mg/dL   Calcium 9.3 8.9 - 10.3 mg/dL   Total Protein 4.9 (L) 6.5 - 8.1 g/dL   Albumin 2.6 (L) 3.5 - 5.0 g/dL   AST 74 (H) 15 - 41 U/L   ALT 33 0 - 44 U/L   Alkaline Phosphatase 437 (H) 38 - 126 U/L   Total Bilirubin 7.5 (H) 0.3 - 1.2 mg/dL   GFR, Estimated >60 >60 mL/min    Comment: (NOTE) Calculated using the CKD-EPI Creatinine Equation (2021)    Anion gap 11 5 - 15    Comment: Performed at [facility], [address], [place], [place] [address]  Ammonia     Status: Abnormal   Collection Time: 10/26/21  5:21 AM  Result Value Ref Range   Ammonia 43 (H) 9 - 35 umol/L     Comment: Performed at [facility], [address] [place], [place] [address]  Protime-INR     Status: Abnormal   Collection Time: 10/26/21  5:21 AM  Result Value Ref Range   Prothrombin Time 21.7 (H) 11.4 - 15.2 seconds   INR 1.9 (H) 0.8 - 1.2    Comment: (NOTE) INR goal varies based on device and disease states. Performed at [facility], [address] [place], [place] [address]   Glucose, capillary     Status: None   Collection Time: 10/26/21  7:53 AM  Result Value Ref Range   Glucose-Capillary 79 70 - 99 mg/dL    Comment: Glucose reference range applies only to samples taken after fasting for at least 8 hours.  Glucose, capillary     Status: None   Collection Time: 10/26/21 12:17 PM  Result Value Ref Range   Glucose-Capillary 89 70 - 99 mg/dL    Comment: Glucose reference range applies only to samples taken after fasting for at least 8 hours.  Glucose, capillary     Status: Abnormal   Collection Time: 10/26/21  5:25 PM  Result Value Ref Range   Glucose-Capillary 192 (H) 70 - 99 mg/dL    Comment: Glucose reference range applies only to samples taken after fasting for at least 8 hours.   Comment 1 Notify RN    Comment 2 Document in Chart    DG CHEST PORT 1 VIEW  Result Date: 10/26/2021 CLINICAL DATA:  Leukocytosis.  Cough and short of breath EXAM: PORTABLE CHEST 1 VIEW COMPARISON:  10/23/2021 FINDINGS: Right lower left airspace disease and right pleural effusion unchanged from the prior study. Small left effusion unchanged. Heart size and vascularity normal.  Negative for heart failure. IMPRESSION: No significant interval change. Right lower lobe airspace disease and right pleural effusion unchanged Electronically Signed  By: Franchot Gallo M.D.   On: 10/26/2021 14:11     MEDICATIONS: I have reviewed the patient's current medications.     Assessment/  1.  Leukemoid reaction: - White count on presentation 137, left shift.  White count today improved to 100.3. - Prior white  count on 07/24/2021 was normal at 5.4. - Blood cultures from 10/23/2021 showed aerobic and anaerobic-gram-positive cocci in clusters - He is being treated for SBP.  2.  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  Cirrhosis complicated by ascites and nonbleeding esophageal varices, status post banding (11/2019 EGD showed G2-3 varices) at [facility].  No known hepatic encephalopathy. - CTAP on 10/23/2021 without contrast showed hypoattenuating liver lesions, suspicious for multifocal HCC. - AFP elevated at 64.3 (10/23/2021).  AFP 9.3 (08/02/2021) at [facility]. - Ultrasound Doppler of the liver on 10/24/2021 showed complete occlusion of the main and intrahepatic portal veins, unclear if it is plan thrombus or tumor thrombus.  Questionable thrombus within the IVC.  Small cirrhotic liver with heavily nodular contour.  No discrete hepatic mass lesion identified. - Paracentesis of 3.6 L on 10/24/2021. - EGD on 10/25/2021-hypopharynx and proximal esophagus normal.  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  5 bands were successfully placed with incomplete eradication of varices.  There was no bleeding during and at the end of the procedure.  Moderate portal hypertensive gastropathy found in the gastric fundus and body.  Multiple 4 to 9 mm apparently did not sessile polyps with no bleeding and no stigmata of recent bleeding.  Duodenum bulb and second portion normal. - MRI of the liver with and without contrast on 10/24/2021 showed 9.5 cm infiltrating enhancing mass in the posterior right hepatic dome, centered in segment 7, compatible with HCC.  Additional 2.1 cm enhancing lesion in segment 5 also compatible with HCC.  3.  Social/family history: - Lives at home with his daughter who works as an Therapist, sports at [employer].  He used to work as a Designer, industrial/product at [employer].  Quit smoking several years ago. - Maternal grand mother had pancreatic cancer.   Maternal grandfather had lung cancer.  Paternal grandfather had lung cancer   Plan:  1.  Multifocal HCC: - He is not a candidate for transplant or any liver directed intervention. - His labs show that his bilirubin has gone up to 7.5.  Pro time is 21.7 seconds. - I have discussed with the patient and his daughter Lawrence Rogers. - Based on his child class C, he is not a candidate for any systemic therapy at this time. - His bilirubin has been steadily going up since hospitalization.  Last bilirubin in August 02, 2021 was 1.5. - Unclear if this is due to SBP or his malignancy. - If it is from SBP, there is a chance that he might become child class A/B at which point we can offer systemic therapy.  Otherwise he will be offered hospice care.  2.  Portal vein thrombosis: - EGD with grade 3 varices in the mid to distal esophagus, 5 bands applied with incomplete eradication of varices.  Moderate portal hypertensive gastropathy. - Not a candidate for DOAC's given his liver function. - I would not recommend any anticoagulation at this time.  3.  Leukemoid reaction: - This is from combination of SBP and HCC. - Today his white count is 101, predominantly neutrophils.  All questions were answered. The patient knows  to call the clinic with any problems, questions or concerns. We can certainly see the patient much sooner if necessary.     Derek Jack

## 2021-10-26 NOTE — Progress Notes (Signed)
PROGRESS NOTE   Lawrence Rogers  RJJ:[id number] DOB: 06-Feb-1961 DOA: 10/23/2021 PCP: [name], MD   No chief complaint on file.   Level of care: Telemetry  Brief Admission History:  Lawrence Rogers is a 60 y.o. 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  At presentation to ED, patient reported weakness, denies syncope, loss of consciousness or dizziness. CT abdomen and pelvis showed no posttraumatic deformities. Patient was fluid overloaded with ascites.  Labs at that time showed a leukocytosis 137.1, concerning for malignancy.  Patient MRI was concerning for hepatocellular carcinoma.  Both GI and oncology has been consulted.  Patient is status post large-volume paracentesis of 3.6 L on 11/8. Fluid analysis consistent with SBP, gram stain with gram-positive cocci in clusters; Continue IV antibiotics.  Imaging for falls showed posttraumatic injuries.  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  Subsequent MRI showed 9.5 cm infiltrating enhancing mass.  Palliative care has seen patient; patient now DNR.  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   Assessment & Plan:   Active Problems:   Cirrhosis of liver with ascites (HCC)   Cirrhosis of liver (HCC)   Ascites   SBP (spontaneous bacterial peritonitis) (HCC)   Liver mass   Hyperammonemia (HCC)   Peripheral edema   Protein-calorie malnutrition, severe   Leukemoid reaction   Hepatocellular carcinoma (HCC)   Decompensated cirrhosis with SBP -Significant volume overload with noted noncompliance with diuretics at presentation. -S/p paracentesis on 11/7, per GI performed with over 3 L removed. -fluid analysis consistent with SBP (355 PMNs); gram stain with gram-positive cocci in clusters. -Cefepime, vancomycin discontinued -Zosyn was added  to patient's regimen. -Continue IV albumin. -Continue to hold diuretics -AKI improved; continue spironolactone -Status post EGD on 11/9: 3 columns were banded -Ammonia downtrending 55 >> 43 -Increase Aldactone to 100 mg daily. 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  Subsequent MRI showed 9.5 cm infiltrating enhancing mass.  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   -Awaiting further input from oncology. -Per 11/9 oncology note, patient likely available for transplant: Patient will be offered systemic therapy [place] -Possible paracentesis tomorrow   Transaminitis secondary to above -MRI concerning for hepatocellular carcinoma -Labs stable/slightly elevated AST 65>>76>>74, alk phos 386>>418>>437, total bilirubin 5.4>>6.2>>7.5    Progressive weakness/debility with fall-multifactorial -No acute injuries noted -PT/OT evaluation -Fall precautions   Leukocytosis -Oncology has been consulted; believe leukocytosis is secondary to SBP. -WBC increasing; 86.6 >> 101.0 -Continue antibiotics as stated above -Ordering repeat chest x-ray for signs of other infection.     Lactic acidosis -Likely in the setting of hematological malignancy and not likely sepsis physiology -lactic acid 2.4>>2.7   Hyponatremia/AKI -Likely in the setting of prerenal causes with suspicion of hepatorenal syndrome -IV albumin as ordered per GI -Continue to monitor strict I's and O's along with daily weights -Avoid nephrotoxic agents -Spironolactone as stated above -Sodium 129 .  Secondary to volume overload.  We will continue to monitor.  DVT prophylaxis: SCD Code Status: Code Family Communication: Patient stepdaughter Disposition:  Status is: Inpatient  Remains inpatient appropriate because: Oncology work-up and further treatment of SBP.      Consultants:  GI and oncology  Procedures:  Large-volume  paracentesis  Antimicrobials:  Cefepime Vancomycin  Subjective: Patient alert  and oriented x3.  Patient states he feels weak this morning.  Patient did express some frustration about being on liquid diet due to his recently banded varices.  Patient continues to endorse pain on the left hip where he fell, otherwise, no other pain reported.  Patient denies shortness of breath, chest pains, and palpitations.  Objective: Vitals:   10/25/21 1523 10/25/21 2138 10/26/21 0422 10/26/21 0500  BP: (!) 111/55 (!) 114/57 (!) 115/48   Pulse: 80 92 96   Resp: 16 18 18    Temp: 98.6 F (37 C) 98.3 F (36.8 C) 98.4 F (36.9 C)   TempSrc:   Oral   SpO2: 100% 97% 94%   Weight:    101.4 kg  Height:        Intake/Output Summary (Last 24 hours) at 10/26/2021 1302 Last data filed at 10/26/2021 0500 Gross per 24 hour  Intake 1219.36 ml  Output 1100 ml  Net 119.36 ml    Filed Weights   10/25/21 0500 10/25/21 1325 10/26/21 0500  Weight: 95.2 kg 95 kg 101.4 kg    Examination:  General exam: Appears calm and comfortable  Respiratory system: Clear to auscultation. Respiratory effort normal. Cardiovascular system: normal S1 & S2 heard. No JVD, murmurs, rubs, gallops or clicks.  +3 edema present  gastrointestinal system: Abdomen extended, nontender Central nervous system: Alert and oriented. No focal neurological deficits. Skin: Jaundice  psychiatry: Judgement and insight appear normal. Mood & affect appropriate.   Data Reviewed: I have personally reviewed following labs and imaging studies  CBC: Recent Labs  Lab 10/23/21 1044 10/24/21 0517 10/25/21 0506 10/26/21 0521  WBC 137.1* 100.3* 86.6* 101.8*  NEUTROABS 128.9*  --  78.8* 94.7*  HGB 11.6* 9.4* 9.2* 9.9*  HCT 36.2* 28.9* 27.0* 30.6*  MCV 89.8 89.8 87.1 89.7  PLT 111* 76* 76* 78*     Basic Metabolic Panel: Recent Labs  Lab 10/23/21 1044 10/24/21 0517 10/25/21 0506 10/26/21 0521  NA 132* 129* 131* 129*  K 4.1 3.9 3.3* 3.9   CL 93* 96* 95* 98  CO2 22 26 23  20*  GLUCOSE 85 47* 50* 75  BUN 35* 37* 32* 26*  CREATININE 1.39* 1.18 0.94 0.75  CALCIUM 9.5 8.6* 9.2 9.3  MG  --  2.2 2.4  --      GFR: Estimated Creatinine Clearance: 126.4 mL/min (by C-G formula based on SCr of 0.75 mg/dL).  Liver Function Tests: Recent Labs  Lab 10/23/21 1044 10/24/21 0517 10/25/21 0506 10/26/21 0521  AST 77* 65* 76* 74*  ALT 37 29 33 33  ALKPHOS 577* 386* 418* 437*  BILITOT 5.5* 5.1*  5.4* 6.2* 7.5*  PROT 5.4* 4.7* 5.2* 4.9*  ALBUMIN 2.2* 2.3* 2.9* 2.6*     CBG: Recent Labs  Lab 10/26/21 0123 10/26/21 0148 10/26/21 0421 10/26/21 0753 10/26/21 1217  GLUCAP 56* 75 98 79 89     Recent Results (from the past 240 hour(s))  Culture, blood (single)     Status: None (Preliminary result)   Collection Time: 10/23/21 12:52 PM   Specimen: Right Antecubital; Blood  Result Value Ref Range Status   Specimen Description   Final    RIGHT ANTECUBITAL Performed at [facility], [address], [place], [place] [address]    Special Requests   Final    BOTTLES DRAWN AEROBIC AND ANAEROBIC Blood Culture adequate volume Performed at [facility], [address], [place], [place] [address]    Culture  Setup Time   Final  AEB AND ANA BOTTLES Gram Stain Report Called to,Read Back By and Verified With: Coralie Common RN [phone] K FORSYTH CORRECTED RESULTS NO ORGANISMS SEEN PREVIOUSLY REPORTED AS: GRAM POSITIVE COCCI IN CLUSTERS CORRECTED RESULTS CALLED TO: PHARMD S.HURTH AT 2409 ON 10/25/2021 BY T.SAAD.    Culture   Final    NO GROWTH 3 DAYS Performed at [facility], [place] [address] [place], [place] [address]    Report Status PENDING  Incomplete  Blood Culture ID Panel (Reflexed)     Status: None   Collection Time: 10/23/21 12:52 PM  Result Value Ref Range Status   Enterococcus faecalis NOT DETECTED NOT DETECTED Final   Enterococcus Faecium NOT DETECTED NOT DETECTED Final   Listeria monocytogenes NOT DETECTED NOT  DETECTED Final   Staphylococcus species NOT DETECTED NOT DETECTED Final   Staphylococcus aureus (BCID) NOT DETECTED NOT DETECTED Final   Staphylococcus epidermidis NOT DETECTED NOT DETECTED Final   Staphylococcus lugdunensis NOT DETECTED NOT DETECTED Final   Streptococcus species NOT DETECTED NOT DETECTED Final   Streptococcus agalactiae NOT DETECTED NOT DETECTED Final   Streptococcus pneumoniae NOT DETECTED NOT DETECTED Final   Streptococcus pyogenes NOT DETECTED NOT DETECTED Final   A.calcoaceticus-baumannii NOT DETECTED NOT DETECTED Final   Bacteroides fragilis NOT DETECTED NOT DETECTED Final   Enterobacterales NOT DETECTED NOT DETECTED Final   Enterobacter cloacae complex NOT DETECTED NOT DETECTED Final   Escherichia coli NOT DETECTED NOT DETECTED Final   Klebsiella aerogenes NOT DETECTED NOT DETECTED Final   Klebsiella oxytoca NOT DETECTED NOT DETECTED Final   Klebsiella pneumoniae NOT DETECTED NOT DETECTED Final   Proteus species NOT DETECTED NOT DETECTED Final   Salmonella species NOT DETECTED NOT DETECTED Final   Serratia marcescens NOT DETECTED NOT DETECTED Final   Haemophilus influenzae NOT DETECTED NOT DETECTED Final   Neisseria meningitidis NOT DETECTED NOT DETECTED Final   Pseudomonas aeruginosa NOT DETECTED NOT DETECTED Final   Stenotrophomonas maltophilia NOT DETECTED NOT DETECTED Final   Candida albicans NOT DETECTED NOT DETECTED Final   Candida auris NOT DETECTED NOT DETECTED Final   Candida glabrata NOT DETECTED NOT DETECTED Final   Candida krusei NOT DETECTED NOT DETECTED Final   Candida parapsilosis NOT DETECTED NOT DETECTED Final   Candida tropicalis NOT DETECTED NOT DETECTED Final   Cryptococcus neoformans/gattii NOT DETECTED NOT DETECTED Final    Comment: Performed at [facility], [address], [place], [place] [address]  Culture, blood (single)     Status: None (Preliminary result)   Collection Time: 10/23/21 12:53 PM   Specimen: BLOOD LEFT HAND   Result Value Ref Range Status   Specimen Description BLOOD LEFT HAND  Final   Special Requests   Final    BOTTLES DRAWN AEROBIC AND ANAEROBIC Blood Culture results may not be optimal due to an inadequate volume of blood received in culture bottles   Culture   Final    NO GROWTH 3 DAYS Performed at [facility], [address] [place], [place] [address]    Report Status PENDING  Incomplete  Acid Fast Smear (AFB)     Status: None   Collection Time: 10/23/21  2:05 PM   Specimen: Peritoneal Cavity; Body Fluid  Result Value Ref Range Status   AFB Specimen Processing Concentration  Final   Acid Fast Smear Negative  Final    Comment: (NOTE) Performed At: [facility] [place], [place] [id number] Rush Farmer MD XQ:[id number]    Source (AFB)  PERITONEAL  Final    Comment: Performed at [facility], [address], [place], [place] [address]  Culture, body fluid w Gram Stain-bottle     Status: None (Preliminary result)   Collection Time: 10/23/21  2:05 PM   Specimen: Peritoneal Washings  Result Value Ref Range Status   Specimen Description PERITONEAL  Final   Special Requests   Final    BOTTLES DRAWN AEROBIC AND ANAEROBIC Blood Culture adequate volume   Culture   Final    NO GROWTH 3 DAYS Performed at [facility], [address], [place], [place] [address]    Report Status PENDING  Incomplete  Gram stain     Status: None   Collection Time: 10/23/21  2:05 PM   Specimen: Peritoneal Washings  Result Value Ref Range Status   Specimen Description PERITONEAL  Final   Special Requests NONE  Final   Gram Stain   Final    GRAM POSITIVE COCCI IN CLUSTERS CYTOSPIN SMEAR Gram Stain Report Called to,Read Back By and Verified With: Coralie Common RN [phone] K FORSYTH Performed at [facility], [address] [place], [place] [address]    Report Status 10/23/2021 FINAL  Final  Resp Panel by RT-PCR (Flu A&B, Covid) Nasopharyngeal Swab     Status: None   Collection Time:  10/23/21  2:10 PM   Specimen: Nasopharyngeal Swab; Nasopharyngeal(NP) swabs in vial transport medium  Result Value Ref Range Status   SARS Coronavirus 2 by RT PCR NEGATIVE NEGATIVE Final    Comment: (NOTE) SARS-CoV-2 target nucleic acids are NOT DETECTED.  The SARS-CoV-2 RNA is generally detectable in upper respiratory specimens during the acute phase of infection. The lowest concentration of SARS-CoV-2 viral copies this assay can detect is 138 copies/mL. A negative result does not preclude SARS-Cov-2 infection and should not be used as the sole basis for treatment or other patient management decisions. A negative result may occur with  improper specimen collection/handling, submission of specimen other than nasopharyngeal swab, presence of viral mutation(s) within the areas targeted by this assay, and inadequate number of viral copies(<138 copies/mL). A negative result must be combined with clinical observations, patient history, and epidemiological information. The expected result is Negative.  Fact Sheet for Patients:  EntrepreneurPulse.com.au  Fact Sheet for Healthcare Providers:  IncredibleEmployment.be  This test is no t yet approved or cleared by the [place] FDA and  has been authorized for detection and/or diagnosis of SARS-CoV-2 by FDA under an Emergency Use Authorization (EUA). This EUA will remain  in effect (meaning this test can be used) for the duration of the COVID-19 declaration under Section 564(b)(1) of the Act, 21 U.S.C.section 360bbb-3(b)(1), unless the authorization is terminated  or revoked sooner.       Influenza A by PCR NEGATIVE NEGATIVE Final   Influenza B by PCR NEGATIVE NEGATIVE Final    Comment: (NOTE) The Xpert Xpress SARS-CoV-2/FLU/RSV plus assay is intended as an aid in the diagnosis of influenza from Nasopharyngeal swab specimens and should not be used as a sole basis for treatment. Nasal washings  and aspirates are unacceptable for Xpert Xpress SARS-CoV-2/FLU/RSV testing.  Fact Sheet for Patients: EntrepreneurPulse.com.au  Fact Sheet for Healthcare Providers: IncredibleEmployment.be  This test is not yet approved or cleared by the [place] FDA and has been authorized for detection and/or diagnosis of SARS-CoV-2 by FDA under an Emergency Use Authorization (EUA). This EUA will remain in effect (meaning this test can be used) for the duration of the COVID-19  declaration under Section 564(b)(1) of the Act, 21 U.S.C. section 360bbb-3(b)(1), unless the authorization is terminated or revoked.  Performed at [facility], [address] [place], [place] [address]   MRSA Next Gen by PCR, Nasal     Status: None   Collection Time: 10/23/21  6:17 PM   Specimen: Nasal Mucosa; Nasal Swab  Result Value Ref Range Status   MRSA by PCR Next Gen NOT DETECTED NOT DETECTED Final    Comment: (NOTE) The GeneXpert MRSA Assay (FDA approved for NASAL specimens only), is one component of a comprehensive MRSA colonization surveillance program. It is not intended to diagnose MRSA infection nor to guide or monitor treatment for MRSA infections. Test performance is not FDA approved in patients less than 73 years old. Performed at [facility], [address] [place] [address] [place], [place] [address]       Radiology Studies: MR LIVER W WO CONTRAST  Result Date: 10/24/2021 CLINICAL DATA:  Cirrhosis, new liver lesions on CT, occlusion of main and intrahepatic portal veins EXAM: MRI ABDOMEN WITHOUT AND WITH CONTRAST TECHNIQUE: Multiplanar multisequence MR imaging of the abdomen was performed both before and after the administration of intravenous contrast. CONTRAST:  39m GADAVIST GADOBUTROL 1 MMOL/ML IV SOLN COMPARISON:  CT abdomen/pelvis dated 10/23/2021 FINDINGS: Motion degraded images. Lower chest: Moderate right and small left pleural effusions. Associated bilateral lower lobe  opacities, likely atelectasis. Hepatobiliary: Cirrhosis. 9.5 cm infiltrating enhancing mass in the posterior right hepatic dome, centered in segment 7 (series 6/image 15), compatible with hepatocellular carcinoma (LI-RADS 5). Additional 2.1 cm enhancing lesion in segment 5 (series 14/image 51), also compatible with HCC (LI-RADS 5). Gallbladder is underdistended with mild gallbladder wall thickening/edema, likely secondary to venous congestion/ascites. No cholelithiasis. No intrahepatic or extrahepatic ductal dilatation. Pancreas:  Within normal limits. Spleen:  Enlarged, measuring 20.2 cm in craniocaudal dimension. Adrenals/Urinary Tract:  Adrenal glands are within normal limits. 1.6 cm cyst with layering hemorrhage in the posterior right upper kidney (series 4/image 30), benign (Bosniak II). Left kidney is within normal limits. No hydronephrosis. Stomach/Bowel: Stomach is within normal limits. Visualized bowel is grossly unremarkable. Vascular/Lymphatic:  No evidence of abdominal aortic aneurysm. Main portal vein thrombosis (series 18/image 45), extending into the left and right portal veins (series 201/images 34 and 42). No associated findings to suggest tumor thrombus. Gastroesophageal varices. Other: Small to moderate abdominal ascites, predominantly perihepatic. Musculoskeletal: No focal osseous lesions. 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 No associated findings to suggest tumor thrombus. Cirrhosis. Splenomegaly. Gastroesophageal varices. Small to moderate abdominal ascites. Moderate right and small left pleural effusions. Electronically Signed   By: SJulian HyM.D.   On: 10/24/2021 20:27    Scheduled Meds:  sodium chloride   Intravenous Once   feeding supplement  237 mL Oral BID BM   ferrous sulfate  325 mg Oral Q breakfast   lactulose  10 g Oral Daily    pantoprazole  40 mg Oral QAC breakfast   sodium chloride flush  3 mL Intravenous Q12H   spironolactone  50 mg Oral Daily   Continuous Infusions:  sodium chloride 250 mL (10/23/21 2057)   sodium chloride 10 mL (10/24/21 0103)   piperacillin-tazobactam (ZOSYN)  IV       LOS: 3 days   Time spent: [address] MS4   10/26/2021, 1:02 PM

## 2021-10-26 NOTE — Progress Notes (Addendum)
Palliative: [name], Rogers, is sitting in the Emerald chair in his room.  He greets me, making and mostly keeping eye contact.  He appears acutely/chronically ill, frail.  He is alert and oriented, able to make his basic needs known.  He has belly swollen with ascites.  We have been talking a few minutes when his stepdaughter, Lawrence Rogers, arrives.  We talked about his acute health concerns, SBP, oncology visit and recommendations reviewed.  We talked about palliative medicine services, keeping Lawrence Rogers at the center of care.  At this point Lawrence Rogers would like to meet with oncologist for treatment options.  We talked about questions to ask and considerations.  Lawrence Rogers shares that her mother, Lawrence Rogers had cancer and so they have some experience.  Lawrence Rogers gets her sister, Lawrence Rogers, on the phone for most of our discussion.    Lawrence Rogers asks about and we discussed the difference between palliative and hospice.  Lawrence Rogers states that he would like the services that hospice provides, but realizes that he cannot have hospice if he is seeking active cancer treatment.  They would like outpatient palliative services with hospice of [facility].  Conference with attending, bedside nursing staff, transition of care team related to patient condition, needs, goals of care.  Plan: Outpatient follow-up with oncology for treatment options.  Continue to treat the treatable but no CPR or intubation.  Outpatient palliative services with hospice of [facility].  64 minutes [name], NP Palliative medicine team Team phone [phone] Greater than 50% of this time was spent counseling and coordinating care related to the above assessment and plan.

## 2021-10-27 ENCOUNTER — Encounter (HOSPITAL_COMMUNITY): Payer: Self-pay | Admitting: Internal Medicine

## 2021-10-27 ENCOUNTER — Inpatient Hospital Stay (HOSPITAL_COMMUNITY): Payer: Managed Care, Other (non HMO)

## 2021-10-27 ENCOUNTER — Encounter (HOSPITAL_COMMUNITY): Payer: Managed Care, Other (non HMO)

## 2021-10-27 DIAGNOSIS — R609 Edema, unspecified: Secondary | ICD-10-CM | POA: Diagnosis not present

## 2021-10-27 DIAGNOSIS — C220 Liver cell carcinoma: Secondary | ICD-10-CM | POA: Diagnosis not present

## 2021-10-27 DIAGNOSIS — D72823 Leukemoid reaction: Secondary | ICD-10-CM | POA: Diagnosis not present

## 2021-10-27 DIAGNOSIS — R188 Other ascites: Secondary | ICD-10-CM | POA: Diagnosis not present

## 2021-10-27 LAB — CBC WITH DIFFERENTIAL/PLATELET
Band Neutrophils: 3 %
Band Neutrophils: 7 %
Basophils Absolute: 0 10*3/uL (ref 0.0–0.1)
Basophils Absolute: 0 10*3/uL (ref 0.0–0.1)
Basophils Relative: 0 %
Basophils Relative: 0 %
Eosinophils Absolute: 0 10*3/uL (ref 0.0–0.5)
Eosinophils Absolute: 0 10*3/uL (ref 0.0–0.5)
Eosinophils Relative: 0 %
Eosinophils Relative: 0 %
HCT: 30.6 % — ABNORMAL LOW (ref 39.0–52.0)
HCT: 29.2 % — ABNORMAL LOW (ref 39.0–52.0)
Hemoglobin: 9.9 g/dL — ABNORMAL LOW (ref 13.0–17.0)
Hemoglobin: 9.7 g/dL — ABNORMAL LOW (ref 13.0–17.0)
Lymphocytes Relative: 1 %
Lymphocytes Relative: 3 %
Lymphs Abs: 1 10*3/uL (ref 0.7–4.0)
Lymphs Abs: 3.1 10*3/uL (ref 0.7–4.0)
MCH: 29 pg (ref 26.0–34.0)
MCH: 29.8 pg (ref 26.0–34.0)
MCHC: 32.4 g/dL (ref 30.0–36.0)
MCHC: 33.2 g/dL (ref 30.0–36.0)
MCV: 89.7 fL (ref 80.0–100.0)
MCV: 89.6 fL (ref 80.0–100.0)
Metamyelocytes Relative: 3 %
Monocytes Absolute: 2 10*3/uL — ABNORMAL HIGH (ref 0.1–1.0)
Monocytes Absolute: 3.1 10*3/uL — ABNORMAL HIGH (ref 0.1–1.0)
Monocytes Relative: 2 %
Monocytes Relative: 3 %
Myelocytes: 4 %
Myelocytes: 3 %
Neutro Abs: 94.7 10*3/uL — ABNORMAL HIGH (ref 1.7–7.7)
Neutro Abs: 91.4 10*3/uL — ABNORMAL HIGH (ref 1.7–7.7)
Neutrophils Relative %: 90 %
Neutrophils Relative %: 81 %
Platelets: 78 10*3/uL — ABNORMAL LOW (ref 150–400)
Platelets: 87 10*3/uL — ABNORMAL LOW (ref 150–400)
RBC: 3.41 MIL/uL — ABNORMAL LOW (ref 4.22–5.81)
RBC: 3.26 MIL/uL — ABNORMAL LOW (ref 4.22–5.81)
RDW: 21.5 % — ABNORMAL HIGH (ref 11.5–15.5)
RDW: 21.9 % — ABNORMAL HIGH (ref 11.5–15.5)
WBC: 101.8 10*3/uL (ref 4.0–10.5)
WBC: 103.9 10*3/uL (ref 4.0–10.5)
nRBC: 0 % (ref 0.0–0.2)
nRBC: 0 % (ref 0.0–0.2)

## 2021-10-27 LAB — CULTURE, BLOOD (SINGLE)
Culture: NO GROWTH
Special Requests: ADEQUATE

## 2021-10-27 LAB — COMPREHENSIVE METABOLIC PANEL
ALT: 34 U/L (ref 0–44)
AST: 55 U/L — ABNORMAL HIGH (ref 15–41)
Albumin: 2.6 g/dL — ABNORMAL LOW (ref 3.5–5.0)
Alkaline Phosphatase: 427 U/L — ABNORMAL HIGH (ref 38–126)
Anion gap: 10 (ref 5–15)
BUN: 26 mg/dL — ABNORMAL HIGH (ref 6–20)
CO2: 21 mmol/L — ABNORMAL LOW (ref 22–32)
Calcium: 9.5 mg/dL (ref 8.9–10.3)
Chloride: 98 mmol/L (ref 98–111)
Creatinine, Ser: 0.79 mg/dL (ref 0.61–1.24)
GFR, Estimated: >60 mL/min (ref >60)
Glucose, Bld: 101 mg/dL — ABNORMAL HIGH (ref 70–99)
Potassium: 3.8 mmol/L (ref 3.5–5.1)
Sodium: 129 mmol/L — ABNORMAL LOW (ref 135–145)
Total Bilirubin: 7.1 mg/dL — ABNORMAL HIGH (ref 0.3–1.2)
Total Protein: 4.8 g/dL — ABNORMAL LOW (ref 6.5–8.1)

## 2021-10-27 LAB — GRAM STAIN

## 2021-10-27 LAB — GLUCOSE, CAPILLARY
Glucose-Capillary: 94 mg/dL (ref 70–99)
Glucose-Capillary: 96 mg/dL (ref 70–99)
Glucose-Capillary: 225 mg/dL — ABNORMAL HIGH (ref 70–99)
Glucose-Capillary: 156 mg/dL — ABNORMAL HIGH (ref 70–99)
Glucose-Capillary: 170 mg/dL — ABNORMAL HIGH (ref 70–99)

## 2021-10-27 LAB — BODY FLUID CELL COUNT WITH DIFFERENTIAL
Lymphs, Fluid: 18 %
Monocyte-Macrophage-Serous Fluid: 5 % — ABNORMAL LOW (ref 50–90)
Neutrophil Count, Fluid: 77 % — ABNORMAL HIGH (ref 0–25)
Total Nucleated Cell Count, Fluid: 631 cu mm (ref 0–1000)

## 2021-10-27 LAB — AMMONIA: Ammonia: 36 umol/L — ABNORMAL HIGH (ref 9–35)

## 2021-10-27 LAB — PROTIME-INR
INR: 2 — ABNORMAL HIGH (ref 0.8–1.2)
Prothrombin Time: 22.6 seconds — ABNORMAL HIGH (ref 11.4–15.2)

## 2021-10-27 MED ORDER — BUMETANIDE 1 MG PO TABS
1.0000 mg | ORAL_TABLET | Freq: Every day | ORAL | Status: DC
  Administered 2021-10-28 – 2021-10-31 (×4): 1 mg via ORAL
  Filled 2021-10-27 (×4): qty 1

## 2021-10-27 MED ORDER — LACTULOSE 10 GM/15ML PO SOLN
10.0000 g | Freq: Two times a day (BID) | ORAL | Status: DC
  Administered 2021-10-27 – 2021-10-31 (×8): 10 g via ORAL
  Filled 2021-10-27 (×8): qty 30

## 2021-10-27 NOTE — Progress Notes (Addendum)
Subjective: States he feels ok today. No specific complaints. Abdomen is distended and a bit more tight today but doesn't have any abdominal pain. Still with some left sided rib and back pain from recent fall.  Tolerating soft diet well.  Denies nausea, vomiting.  Has not had a bowel movement in 3 days.  Still passing gas.  Objective: Vital signs in last 24 hours: Temp:  [97.3 F (36.3 C)-98 F (36.7 C)] 97.9 F (36.6 C) (11/11 0427) Pulse Rate:  [100-108] 100 (11/11 0427) Resp:  [18] 18 (11/11 0427) BP: (90-113)/(52-65) 101/52 (11/11 0427) SpO2:  [93 %-98 %] 93 % (11/11 0427) Weight:  [101.3 kg] 101.3 kg (11/11 0429) Last BM Date: 10/23/21 General:   Alert and oriented, pleasant, NAD, jaundiced.  Head:  Normocephalic and atraumatic. Eyes:  With scleral icterus. Abdomen:  Bowel sounds present, distended with tense ascites, non-tender. Soft umbilical hernia.  No rebound or guarding.  Msk:  Symmetrical without gross deformities. Normal posture. Extremities:  With 3+ pitting edema up to knees bilaterally, no erythema to tenderness.  Neurologic:  Alert and  oriented x4;  grossly normal neurologically, no asterixis. Skin:  Warm and dry, intact without significant lesions.  Psych:  Normal mood and affect.  Intake/Output from previous day: 11/10 0701 - 11/11 0700 In: 1672.7 [P.O.:480; IV Piggyback:1192.7] Out: 700 [Urine:700] Intake/Output this shift: No intake/output data recorded.  Lab Results: Recent Labs    10/25/21 0506 10/26/21 0521 10/27/21 0508  WBC 86.6* 101.8* 103.9*  HGB 9.2* 9.9* 9.7*  HCT 27.0* 30.6* 29.2*  PLT 76* 78* 87*   BMET Recent Labs    10/25/21 0506 10/26/21 0521 10/27/21 0508  NA 131* 129* 129*  K 3.3* 3.9 3.8  CL 95* 98 98  CO2 23 20* 21*  GLUCOSE 50* 75 101*  BUN 32* 26* 26*  CREATININE 0.94 0.75 0.79  CALCIUM 9.2 9.3 9.5   LFT Recent Labs    10/25/21 0506 10/26/21 0521 10/27/21 0508  PROT 5.2* 4.9* 4.8*  ALBUMIN 2.9* 2.6* 2.6*   AST 76* 74* 55*  ALT 33 33 34  ALKPHOS 418* 437* 427*  BILITOT 6.2* 7.5* 7.1*   PT/INR Recent Labs    10/26/21 0521 10/27/21 0508  LABPROT 21.7* 22.6*  INR 1.9* 2.0*    Studies/Results: DG CHEST PORT 1 VIEW  Result Date: 10/26/2021 CLINICAL DATA:  Leukocytosis.  Cough and short of breath EXAM: PORTABLE CHEST 1 VIEW COMPARISON:  10/23/2021 FINDINGS: Right lower left airspace disease and right pleural effusion unchanged from the prior study. Small left effusion unchanged. Heart size and vascularity normal.  Negative for heart failure. IMPRESSION: No significant interval change. Right lower lobe airspace disease and right pleural effusion unchanged Electronically Signed   By: Franchot Gallo M.D.   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 He presented to the hospital on 10/23/21 after a fall at home with weakness/fatigue, and acutely decompensated disease with ascites, anasarca, worsening renal function, profound leukocytosis with WBC initially 137, lactic acidosis, and CT findings concerning for possible HCC. Oncology was also consulted due to WBC count, recommended abx.   Newly decompensated NASH cirrhosis with SBP/Anasraca:  MELD 24 on admission. MELD climbing, up to 27 today with INR up to 2.0 today, T bili slightly improved to 7.1 today.  No signs of hepatic  encephalopathy, currently on lactulose, though no BM in 3 days, still passing gas.  Notable tense ascites and significant lower extremity edema. Noncompliant with diuretics as an outpatient (previously Bumex 3 mg and spironolactone 200 mg) as he has not felt well.  Paracentesis on 11/7 yielding 3.6 L of fluid, cell count consistent with SBP with 355 PMNs, gram stain from right antecubital blood culture with gram-positive cocci in clusters on gram stain but cultures still negative. Body  fluid culture no growth. Currently on broad-spectrum antibiotic therapy, day 4.  Completed albumin per protocol. Diuretics initially held in the setting of elevated creatinine on admission (now improved), currently on spironolactone 50 mg daily. Weight 222.66 lbs on admission--> 209.44 lbs 11/9 --> 223.55 lbs yesterday --> 223.33 lbs today. Would benefit from increasing diuretics. In light of soft BP and hyponatremia, will discuss with Dr. Gala Romney.   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  Moderate portal hypertensive gastropathy.  Multiple 4 to 9 mm pedunculated and sessile polyps with no bleeding in the prepyloric region and of the pylorus.    Liver lesions:  MRI with 9.5 cm infiltrating mass and additional 2.1 cm enhancing lesion, both compatible with multifocal HCC.  AFP 9.3 (07/2021) and currently 64.3. Oncology following.  Unfortunately, patient is not currently a candidate for transplant or liver directed intervention.  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  Questionable thrombus within the IVC.  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 Per Dr. Delton Coombes, given patient's Child Pugh Class C disease, he is not a candidate for DOACs, and he did not recommend any anticoagulation at this time.   Lower extremity edema:  Patient reports leg swelling on the right side chronic and recently left leg became swollen as well. Currently with equal 3+ bilateral lower extremity pitting edema up to his knees. Likely related hypoalbuminemia/cirrhosis. Can't rule out underlying clot though he has  no pain or erythema in his LE.   Leukocytosis:  Likely multifactorial in the setting of SBP and HCC.  White count 137,000 on admission, improved to 186.6 bowels in on 11/9, back up to 103.9 thousand today.  He remains afebrile and clinically seems to be doing well from an SBP standpoint.  He is undergoing repeat paracentesis today with fluid analysis.    Plan: Paracentesis with fluid analysis and albumin per protocol today for therapeutic and diagnostic purposes. Complete 5-day course of IV antibiotics (started mid day 11/7). Then transition to ciprofloxacin 500 mg daily for SBP prophylaxis.  Increase lactulose to 15 mL twice daily.  Titrate to 3 soft bowel movements daily. Consider increasing diuretics.  Will discuss with Dr. Gala Romney. Consider lower extremity Dopplers to assess for clots. Appreciate oncology input. Anticipate discharge with palliative/hospice services.  Appreciate palliative input. Previously recommended repeat EGD in 4 weeks; however, if discharging on hospice, this will not be pursued.  He will likely need repeat therapeutic paracentesis moving forward.   Addendum: Discussed diuretics with Dr. Gala Romney.  We will plan to resume 1 mg of Bumex daily (was on 3 mg outpatient) starting tomorrow as he had paracentesis  today.  Continue Aldactone 50 mg daily.  He will need close monitoring of kidney function and electrolytes.   LOS: 4 days    10/27/2021, 11:26 AM   Aliene Altes, PA-C [facility]

## 2021-10-27 NOTE — Progress Notes (Addendum)
PROGRESS NOTE   Lawrence Rogers  IRJ:[id number] DOB: [birth year] DOA: 10/23/2021 PCP: [name], MD   No chief complaint on file.   Level of care: Med-Surg  Brief Admission History:  Lawrence Rogers is a 61 y.o. 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  At presentation to ED, patient reported weakness, denies syncope, loss of consciousness or dizziness. CT abdomen and pelvis showed no posttraumatic deformities. Patient was fluid overloaded with ascites.  Labs at that time showed a leukocytosis 137.1, concerning for malignancy.  Patient MRI was concerning for hepatocellular carcinoma.  Both GI and oncology has been consulted.  Patient is status post large-volume paracentesis of 3.6 L on 11/8. Fluid analysis consistent with SBP, gram stain with gram-positive cocci in clusters; Continue IV antibiotics.  Imaging for falls showed posttraumatic injuries.  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  Subsequent MRI showed 9.5 cm infiltrating enhancing mass.  Palliative care has seen patient; patient now DNR.  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   Assessment & Plan:   Active Problems:   Cirrhosis of liver with ascites (HCC)   Cirrhosis of liver (HCC)   Ascites   SBP (spontaneous bacterial peritonitis) (HCC)   Liver mass   Hyperammonemia (HCC)   Peripheral edema   Protein-calorie malnutrition, severe   Leukemoid reaction   Hepatocellular carcinoma (HCC)   Decompensated cirrhosis with SBP -Significant volume overload with noted noncompliance with diuretics at presentation. -S/p paracentesis on 11/7, per GI performed with over 3 L removed. -fluid analysis consistent with SBP (355 PMNs); gram stain with gram-positive cocci in clusters. -Cefepime, vancomycin discontinued -Zosyn was added  to patient's regimen. 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  Subsequent MRI showed 9.5 cm infiltrating enhancing mass.  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 If that improves to A/B he may be able to have chemo therapy.   -Per 11/9 oncology note, patient likely available for transplant: Patient will be offered systemic therapy [facility] -Repeat Paracentesis 11/11 with Dr. Thornton Papas for therapeutic purposes. Pt tolerated with good results.     Hepatocellular Carcinoma - Pt contemplating hospice care  - oncology not able to offer systemic chemo due to Child class C status - prognosis less than 6 weeks  - difficult choices ahead   Transaminitis secondary to above -MRI concerning for hepatocellular carcinoma -Labs stable/slightly elevated AST 65>>76>>74, alk phos 386>>418>>437, total bilirubin 5.4>>6.2>>7.5   Progressive weakness/debility with fall-multifactorial -No acute injuries noted -PT/OT evaluation -Fall precautions   Leukocytosis -Oncology has been consulted; believe leukocytosis is secondary to SBP. -WBC increasing; 86.6 >> 101.0 -Continue antibiotics as stated above -repeat CXR with no signs of infection found.    Lactic acidosis - resolved now -Likely in the setting of hematological malignancy and not likely sepsis physiology   Hyponatremia/AKI -Likely in the setting of prerenal causes with suspicion of hepatorenal syndrome -IV albumin as ordered per GI -Continue to monitor strict I's and O's along  with daily weights -Avoid nephrotoxic agents -Spironolactone as stated  above -Sodium 129 .  Secondary to volume overload.  We will continue to monitor.  DVT prophylaxis: SCD Code Status: Code Family Communication: Patient stepdaughter Disposition:  Status is: Inpatient  Remains inpatient appropriate because: Oncology work-up and further treatment of SBP.   Consultants:  GI and oncology  Procedures:  Large-volume paracentesis  Antimicrobials:  Cefepime Vancomycin  Subjective: Patient wants to go home but needs 24/7 support and supervision, not interested in residential hospice at this time.  Appetite a little better today.    Objective: Vitals:   10/27/21 1252 10/27/21 1303 10/27/21 1320 10/27/21 1447  BP: 137/69 119/60 122/62 124/64  Pulse: (!) 103 (!) 102 (!) 102 100  Resp: 18 18 18 18   Temp:    98.2 F (36.8 C)  TempSrc:    Oral  SpO2: 94% 93% 94% 97%  Weight:      Height:        Intake/Output Summary (Last 24 hours) at 10/27/2021 1610 Last data filed at 10/27/2021 1511 Gross per 24 hour  Intake 465.41 ml  Output 700 ml  Net -234.59 ml   Filed Weights   10/25/21 1325 10/26/21 0500 10/27/21 0429  Weight: 95 kg 101.4 kg 101.3 kg    Examination:  General exam: Appears calm and comfortable.  Appears very ill, appears jaundiced, appears older than stated age.  Respiratory system: Clear to auscultation. Respiratory effort normal. Cardiovascular system: normal S1 & S2 heard. No JVD, murmurs, rubs, gallops or clicks.  +3 edema present  gastrointestinal system: Abdomen extended, nontender Central nervous system: Alert and oriented. No focal neurological deficits. Skin: Jaundice  psychiatry: Judgement and insight appear normal. Mood & affect appropriate.   Data Reviewed: I have personally reviewed following labs and imaging studies  CBC: Recent Labs  Lab 10/23/21 1044 10/24/21 0517 10/25/21 0506 10/26/21 0521 10/27/21 0508  WBC 137.1* 100.3* 86.6* 101.8* 103.9*  NEUTROABS 128.9*  --  78.8* 94.7* 91.4*  HGB 11.6* 9.4* 9.2*  9.9* 9.7*  HCT 36.2* 28.9* 27.0* 30.6* 29.2*  MCV 89.8 89.8 87.1 89.7 89.6  PLT 111* 76* 76* 78* 87*    Basic Metabolic Panel: Recent Labs  Lab 10/23/21 1044 10/24/21 0517 10/25/21 0506 10/26/21 0521 10/27/21 0508  NA 132* 129* 131* 129* 129*  K 4.1 3.9 3.3* 3.9 3.8  CL 93* 96* 95* 98 98  CO2 22 26 23  20* 21*  GLUCOSE 85 47* 50* 75 101*  BUN 35* 37* 32* 26* 26*  CREATININE 1.39* 1.18 0.94 0.75 0.79  CALCIUM 9.5 8.6* 9.2 9.3 9.5  MG  --  2.2 2.4  --   --     GFR: Estimated Creatinine Clearance: 126.3 mL/min (by C-G formula based on SCr of 0.79 mg/dL).  Liver Function Tests: Recent Labs  Lab 10/23/21 1044 10/24/21 0517 10/25/21 0506 10/26/21 0521 10/27/21 0508  AST 77* 65* 76* 74* 55*  ALT 37 29 33 33 34  ALKPHOS 577* 386* 418* 437* 427*  BILITOT 5.5* 5.1*  5.4* 6.2* 7.5* 7.1*  PROT 5.4* 4.7* 5.2* 4.9* 4.8*  ALBUMIN 2.2* 2.3* 2.9* 2.6* 2.6*    CBG: Recent Labs  Lab 10/26/21 1955 10/26/21 2341 10/27/21 0425 10/27/21 0721 10/27/21 1115  GLUCAP 190* 100* 94 96 225*    Recent Results (from the past 240 hour(s))  Culture, blood (single)     Status: None (Preliminary result)   Collection Time: 10/23/21 12:52 PM   Specimen: Right Antecubital; Blood  Result Value Ref Range Status   Specimen Description   Final    RIGHT ANTECUBITAL Performed at [facility], [address], [place], [place] [address]    Special Requests   Final    BOTTLES DRAWN AEROBIC AND ANAEROBIC Blood Culture adequate volume Performed at [facility], [address] [place], [place] [address]    Culture  Setup Time   Final    AEB AND ANA BOTTLES Gram Stain Report Called to,Read Back By and Verified With: Coralie Common RN [phone] K FORSYTH CORRECTED RESULTS NO ORGANISMS SEEN PREVIOUSLY REPORTED AS: GRAM POSITIVE COCCI IN CLUSTERS CORRECTED RESULTS CALLED TO: PHARMD S.HURTH AT 1308 ON 10/25/2021 BY T.SAAD. Performed at [facility], [place] [address], [place], [place] [address]     Culture   Final    NO GROWTH 4 DAYS Performed at [facility], [address] [place], [place] [address]    Report Status PENDING  Incomplete  Blood Culture ID Panel (Reflexed)     Status: None   Collection Time: 10/23/21 12:52 PM  Result Value Ref Range Status   Enterococcus faecalis NOT DETECTED NOT DETECTED Final   Enterococcus Faecium NOT DETECTED NOT DETECTED Final   Listeria monocytogenes NOT DETECTED NOT DETECTED Final   Staphylococcus species NOT DETECTED NOT DETECTED Final   Staphylococcus aureus (BCID) NOT DETECTED NOT DETECTED Final   Staphylococcus epidermidis NOT DETECTED NOT DETECTED Final   Staphylococcus lugdunensis NOT DETECTED NOT DETECTED Final   Streptococcus species NOT DETECTED NOT DETECTED Final   Streptococcus agalactiae NOT DETECTED NOT DETECTED Final   Streptococcus pneumoniae NOT DETECTED NOT DETECTED Final   Streptococcus pyogenes NOT DETECTED NOT DETECTED Final   A.calcoaceticus-baumannii NOT DETECTED NOT DETECTED Final   Bacteroides fragilis NOT DETECTED NOT DETECTED Final   Enterobacterales NOT DETECTED NOT DETECTED Final   Enterobacter cloacae complex NOT DETECTED NOT DETECTED Final   Escherichia coli NOT DETECTED NOT DETECTED Final   Klebsiella aerogenes NOT DETECTED NOT DETECTED Final   Klebsiella oxytoca NOT DETECTED NOT DETECTED Final   Klebsiella pneumoniae NOT DETECTED NOT DETECTED Final   Proteus species NOT DETECTED NOT DETECTED Final   Salmonella species NOT DETECTED NOT DETECTED Final   Serratia marcescens NOT DETECTED NOT DETECTED Final   Haemophilus influenzae NOT DETECTED NOT DETECTED Final   Neisseria meningitidis NOT DETECTED NOT DETECTED Final   Pseudomonas aeruginosa NOT DETECTED NOT DETECTED Final   Stenotrophomonas maltophilia NOT DETECTED NOT DETECTED Final   Candida albicans NOT DETECTED NOT DETECTED Final   Candida auris NOT DETECTED NOT DETECTED Final   Candida glabrata NOT DETECTED NOT DETECTED Final   Candida krusei NOT  DETECTED NOT DETECTED Final   Candida parapsilosis NOT DETECTED NOT DETECTED Final   Candida tropicalis NOT DETECTED NOT DETECTED Final   Cryptococcus neoformans/gattii NOT DETECTED NOT DETECTED Final    Comment: Performed at [facility], [place]. [address] [place],  [address]  Culture, blood (single)     Status: None (Preliminary result)   Collection Time: 10/23/21 12:53 PM   Specimen: BLOOD LEFT HAND  Result Value Ref Range Status   Specimen Description BLOOD LEFT HAND  Final   Special Requests   Final    BOTTLES DRAWN AEROBIC AND ANAEROBIC Blood Culture results may not be optimal due to an inadequate volume of blood received in culture bottles   Culture   Final    NO GROWTH 4 DAYS Performed at [facility], [address], [place],  [place] [address]    Report Status PENDING  Incomplete  Acid Fast Smear (AFB)     Status: None   Collection Time: 10/23/21  2:05 PM   Specimen: Peritoneal Cavity; Body Fluid  Result Value Ref Range Status   AFB Specimen Processing Concentration  Final   Acid Fast Smear Negative  Final    Comment: (NOTE) Performed At: [facility] [place], [place] [id number] Rush Farmer MD OJ:[id number]    Source (AFB) PERITONEAL  Final    Comment: Performed at [facility], [address], [place], [place] [address]  Culture, body fluid w Gram Stain-bottle     Status: None (Preliminary result)   Collection Time: 10/23/21  2:05 PM   Specimen: Peritoneal Washings  Result Value Ref Range Status   Specimen Description PERITONEAL  Final   Special Requests   Final    BOTTLES DRAWN AEROBIC AND ANAEROBIC Blood Culture adequate volume   Culture   Final    NO GROWTH 4 DAYS Performed at [facility], [address] [place], [place] [address]    Report Status PENDING  Incomplete  Gram stain     Status: None   Collection Time: 10/23/21  2:05 PM   Specimen: Peritoneal Washings  Result Value Ref Range Status   Specimen Description  PERITONEAL  Final   Special Requests NONE  Final   Gram Stain   Final    GRAM POSITIVE COCCI IN CLUSTERS CYTOSPIN SMEAR Gram Stain Report Called to,Read Back By and Verified With: Coralie Common RN [phone] K FORSYTH Performed at [facility], [address] [place], [place] [address]    Report Status 10/23/2021 FINAL  Final  Resp Panel by RT-PCR (Flu A&B, Covid) Nasopharyngeal Swab     Status: None   Collection Time: 10/23/21  2:10 PM   Specimen: Nasopharyngeal Swab; Nasopharyngeal(NP) swabs in vial transport medium  Result Value Ref Range Status   SARS Coronavirus 2 by RT PCR NEGATIVE NEGATIVE Final    Comment: (NOTE) SARS-CoV-2 target nucleic acids are NOT DETECTED.  The SARS-CoV-2 RNA is generally detectable in upper respiratory specimens during the acute phase of infection. The lowest concentration of SARS-CoV-2 viral copies this assay can detect is 138 copies/mL. A negative result does not preclude SARS-Cov-2 infection and should not be used as the sole basis for treatment or other patient management decisions. A negative result may occur with  improper specimen collection/handling, submission of specimen other than nasopharyngeal swab, presence of viral mutation(s) within the areas targeted by this assay, and inadequate number of viral copies(<138 copies/mL). A negative result must be combined with clinical observations, patient history, and epidemiological information. The expected result is Negative.  Fact Sheet for Patients:  EntrepreneurPulse.com.au  Fact Sheet for Healthcare Providers:  IncredibleEmployment.be  This test is no t yet approved or cleared by the [place] FDA and  has been authorized for detection and/or diagnosis of SARS-CoV-2 by FDA under an Emergency Use Authorization (EUA). This EUA will remain  in effect (meaning this test can be used) for the duration of the COVID-19 declaration under Section 564(b)(1) of the  Act, 21 U.S.C.section 360bbb-3(b)(1), unless the authorization is terminated  or revoked sooner.       Influenza A by PCR NEGATIVE NEGATIVE Final   Influenza B by PCR NEGATIVE NEGATIVE Final    Comment: (NOTE) The Xpert Xpress SARS-CoV-2/FLU/RSV plus assay is intended as an aid in the diagnosis of influenza from Nasopharyngeal swab specimens and should  not be used as a sole basis for treatment. Nasal washings and aspirates are unacceptable for Xpert Xpress SARS-CoV-2/FLU/RSV testing.  Fact Sheet for Patients: EntrepreneurPulse.com.au  Fact Sheet for Healthcare Providers: IncredibleEmployment.be  This test is not yet approved or cleared by the [place] FDA and has been authorized for detection and/or diagnosis of SARS-CoV-2 by FDA under an Emergency Use Authorization (EUA). This EUA will remain in effect (meaning this test can be used) for the duration of the COVID-19 declaration under Section 564(b)(1) of the Act, 21 U.S.C. section 360bbb-3(b)(1), unless the authorization is terminated or revoked.  Performed at [facility], [address] [place], [place] [address]   MRSA Next Gen by PCR, Nasal     Status: None   Collection Time: 10/23/21  6:17 PM   Specimen: Nasal Mucosa; Nasal Swab  Result Value Ref Range Status   MRSA by PCR Next Gen NOT DETECTED NOT DETECTED Final    Comment: (NOTE) The GeneXpert MRSA Assay (FDA approved for NASAL specimens only), is one component of a comprehensive MRSA colonization surveillance program. It is not intended to diagnose MRSA infection nor to guide or monitor treatment for MRSA infections. Test performance is not FDA approved in patients less than 32 years old. Performed at [facility], [address], [place], [place] [address]   Gram stain     Status: None   Collection Time: 10/27/21 12:42 PM   Specimen: Peritoneal Washings  Result Value Ref Range Status   Specimen Description PERITONEAL   Final   Special Requests NONE  Final   Gram Stain   Final    WBC PRESENT,BOTH PMN AND MONONUCLEAR CYTOSPIN SMEAR NO ORGANISMS SEEN Performed at [facility], [address] [place], [place] [address]    Report Status 10/27/2021 FINAL  Final  Culture, body fluid w Gram Stain-bottle     Status: None (Preliminary result)   Collection Time: 10/27/21 12:42 PM   Specimen: Peritoneal Washings  Result Value Ref Range Status   Specimen Description PERITONEAL BOTTLES DRAWN AEROBIC AND ANAEROBIC  Final   Special Requests   Final    10CC Performed at [facility], [address] [place], [place] [address]    Culture PENDING  Incomplete   Report Status PENDING  Incomplete      Radiology Studies: US Paracentesis  Result Date: 10/27/2021 Lavonia Dana, MD     10/27/2021  1:41 PM PreOperative Dx: Cirrhosis, ascites Postoperative Dx: Cirrhosis, ascites Procedure:   US guided paracentesis Radiologist:  Thornton Papas Anesthesia:  10 ml of1% lidocaine Specimen:  3.8 L of yellow ascitic fluid EBL:   < 1 ml Complications: None    DG CHEST PORT 1 VIEW  Result Date: 10/26/2021 CLINICAL DATA:  Leukocytosis.  Cough and short of breath EXAM: PORTABLE CHEST 1 VIEW COMPARISON:  10/23/2021 FINDINGS: Right lower left airspace disease and right pleural effusion unchanged from the prior study. Small left effusion unchanged. Heart size and vascularity normal.  Negative for heart failure. IMPRESSION: No significant interval change. Right lower lobe airspace disease and right pleural effusion unchanged Electronically Signed   By: Franchot Gallo M.D.   On: 10/26/2021 14:11    Scheduled Meds:  sodium chloride   Intravenous Once   [START ON 10/28/2021] bumetanide  1 mg Oral Daily   feeding supplement  237 mL Oral BID BM   ferrous sulfate  325 mg Oral Q breakfast   lactulose  10 g Oral BID   pantoprazole  40 mg Oral QAC  breakfast   sodium chloride flush  3 mL Intravenous Q12H   spironolactone  50 mg Oral Daily    Continuous Infusions:  sodium chloride 250 mL (10/23/21 2057)   sodium chloride 10 mL (10/24/21 0103)   piperacillin-tazobactam (ZOSYN)  IV 12.5 mL/hr at 10/27/21 1511     LOS: 4 days   Time spent: 35 mins  Jasai Sorg Wynetta Emery, MD  How to contact the [facility] Attending or Consulting provider [place] or covering provider during after hours [place], for this patient?  Check the care team in [facility] and look for a) attending/consulting [facility] provider listed and b) the [facility] team listed Log into [email or web address] and use [facility]'s universal password to access. If you do not have the password, please contact the hospital operator. Locate the [facility] provider you are looking for under [facility] and page to a number that you can be directly reached. If you still have difficulty reaching the provider, please page the [facility] (Director on Call) for the Hospitalists listed on amion for assistance.'   10/27/2021, 4:10 PM

## 2021-10-27 NOTE — Progress Notes (Signed)
Date and time results received: 10/27/21 0545   Test: WBC Critical Value: 103.9

## 2021-10-27 NOTE — Procedures (Signed)
PreOperative Dx: Cirrhosis, ascites Postoperative Dx: Cirrhosis, ascites Procedure:   US guided paracentesis Radiologist:  Thornton Papas Anesthesia:  10 ml of1% lidocaine Specimen:  3.8 L of yellow ascitic fluid EBL:   < 1 ml Complications: None

## 2021-10-27 NOTE — Progress Notes (Signed)
Pt tolerated right sided paracentesis procedure well today and 3.8 Liters of clear yellow fluid removed. Pt verbalized understanding of post procedure instructions and returned to inpatient bed assignment at this time via stretcher with NAD noted. Labs that were collected were sent to the lab at this time for processing.

## 2021-10-27 NOTE — Consult Note (Signed)
[facility] Oncology Progress Note  Name: Lawrence Rogers      MRN: [id number]    Location: G626/[id number]  Date: 10/27/2021 Time:2:54 PM   Subjective: Interval History:Lawrence Rogers is seen today on rounds.  He is sitting in chair and having lunch.  He had abdominal paracentesis done today.  He feels improvement in his abdominal distention.  Denies any other symptoms.  Objective: Vital signs in last 24 hours: Temp:  [97.8 F (36.6 C)-98.2 F (36.8 C)] 98.2 F (36.8 C) (11/11 1447) Pulse Rate:  [100-104] 102 (11/11 1320) Resp:  [18] 18 (11/11 1447) BP: (101-142)/(52-78) 124/64 (11/11 1447) SpO2:  [93 %-97 %] 97 % (11/11 1447) Weight:  [223 lb 5.2 oz (101.3 kg)] 223 lb 5.2 oz (101.3 kg) (11/11 0429)    Intake/Output from previous day: 11/10 0800 - 11/11 0759 In: 1672.7 [P.O.:480] Out: 700 [Urine:700]    Intake/Output this shift: Total I/O In: 243 [P.O.:240; I.V.:3] Out: -    PHYSICAL EXAM: BP 124/64   Pulse (!) 102   Temp 98.2 F (36.8 C) (Oral)   Resp 18   Ht 6' 2"  (1.88 m)   Wt 223 lb 5.2 oz (101.3 kg)   SpO2 97%   BMI 28.67 kg/m  General appearance: alert, cooperative, and appears stated age Lungs:  Bilateral air entry with decreased breath sounds at bases. Heart: regular rate and rhythm Abdomen:  Soft, distended with no palpable masses. Extremities:  3+ edema. Neurologic: Grossly normal   Studies/Results: Results for orders placed or performed during the hospital encounter of 10/23/21 (from the past 48 hour(s))  Glucose, capillary     Status: Abnormal   Collection Time: 10/25/21  5:13 PM  Result Value Ref Range   Glucose-Capillary 125 (H) 70 - 99 mg/dL    Comment: Glucose reference range applies only to samples taken after fasting for at least 8 hours.  Glucose, capillary     Status: None   Collection Time: 10/25/21  9:06 PM  Result Value Ref Range   Glucose-Capillary 92 70 - 99 mg/dL    Comment: Glucose reference range applies only to  samples taken after fasting for at least 8 hours.  Glucose, capillary     Status: Abnormal   Collection Time: 10/26/21  1:23 AM  Result Value Ref Range   Glucose-Capillary 56 (L) 70 - 99 mg/dL    Comment: Glucose reference range applies only to samples taken after fasting for at least 8 hours.  Glucose, capillary     Status: None   Collection Time: 10/26/21  1:48 AM  Result Value Ref Range   Glucose-Capillary 75 70 - 99 mg/dL    Comment: Glucose reference range applies only to samples taken after fasting for at least 8 hours.  Glucose, capillary     Status: None   Collection Time: 10/26/21  4:21 AM  Result Value Ref Range   Glucose-Capillary 98 70 - 99 mg/dL    Comment: Glucose reference range applies only to samples taken after fasting for at least 8 hours.  CBC with Differential/Platelet     Status: Abnormal   Collection Time: 10/26/21  5:21 AM  Result Value Ref Range   WBC 101.8 (HH) 4.0 - 10.5 K/uL    Comment: CRITICAL VALUE NOTED.  VALUE IS CONSISTENT WITH PREVIOUSLY REPORTED AND CALLED VALUE. THIS CRITICAL RESULT HAS VERIFIED AND BEEN CALLED TO [facility] BY BOBBIE MATTHEWS ON 11 10 2022 AT 0721, AND HAS BEEN READ BACK.  THIS CRITICAL  RESULT HAS VERIFIED AND BEEN CALLED TO [facility] BY BOBBIE MATTHEWS ON 11 10 2022 AT 0721, AND HAS BEEN READ BACK.     RBC 3.41 (L) 4.22 - 5.81 MIL/uL   Hemoglobin 9.9 (L) 13.0 - 17.0 g/dL   HCT 30.6 (L) 39.0 - 52.0 %   MCV 89.7 80.0 - 100.0 fL   MCH 29.0 26.0 - 34.0 pg   MCHC 32.4 30.0 - 36.0 g/dL   RDW 21.5 (H) 11.5 - 15.5 %   Platelets 78 (L) 150 - 400 K/uL    Comment: Immature Platelet Fraction may be clinically indicated, consider ordering this additional test [id number] SPECIMEN CHECKED FOR CLOTS PLATELET COUNT CONFIRMED BY SMEAR    nRBC 0.0 0.0 - 0.2 %   Neutrophils Relative % 90 %   Neutro Abs 94.7 (H) 1.7 - 7.7 K/uL   Band Neutrophils 3 %   Lymphocytes Relative 1 %   Lymphs Abs 1.0 0.7 - 4.0 K/uL   Monocytes Relative 2 %    Monocytes Absolute 2.0 (H) 0.1 - 1.0 K/uL   Eosinophils Relative 0 %   Eosinophils Absolute 0.0 0.0 - 0.5 K/uL   Basophils Relative 0 %   Basophils Absolute 0.0 0.0 - 0.1 K/uL   WBC Morphology DOHLE BODIES     Comment: HYPERSEGMENTED NEUT MILD LEFT SHIFT (1-5% METAS, OCC MYELO, OCC BANDS) TOXIC GRANULATION VACUOLATED NEUTROPHILS    RBC Morphology RARE NUCLEATED RBC PRESENT    Myelocytes 4 %   Dohle Bodies PRESENT    Hypersegmented Neutrophils PRESENT    Burr Cells PRESENT    Polychromasia PRESENT    Ovalocytes PRESENT     Comment: Performed at [facility], [address], [place], [place] [address]  Comprehensive metabolic panel     Status: Abnormal   Collection Time: 10/26/21  5:21 AM  Result Value Ref Range   Sodium 129 (L) 135 - 145 mmol/L   Potassium 3.9 3.5 - 5.1 mmol/L   Chloride 98 98 - 111 mmol/L   CO2 20 (L) 22 - 32 mmol/L   Glucose, Bld 75 70 - 99 mg/dL    Comment: Glucose reference range applies only to samples taken after fasting for at least 8 hours.   BUN 26 (H) 6 - 20 mg/dL   Creatinine, Ser 0.75 0.61 - 1.24 mg/dL   Calcium 9.3 8.9 - 10.3 mg/dL   Total Protein 4.9 (L) 6.5 - 8.1 g/dL   Albumin 2.6 (L) 3.5 - 5.0 g/dL   AST 74 (H) 15 - 41 U/L   ALT 33 0 - 44 U/L   Alkaline Phosphatase 437 (H) 38 - 126 U/L   Total Bilirubin 7.5 (H) 0.3 - 1.2 mg/dL   GFR, Estimated >60 >60 mL/min    Comment: (NOTE) Calculated using the CKD-EPI Creatinine Equation (2021)    Anion gap 11 5 - 15    Comment: Performed at [facility], [address], [place], [place] [address]  Ammonia     Status: Abnormal   Collection Time: 10/26/21  5:21 AM  Result Value Ref Range   Ammonia 43 (H) 9 - 35 umol/L    Comment: Performed at [facility], [address], [place], [place] [address]  Protime-INR     Status: Abnormal   Collection Time: 10/26/21  5:21 AM  Result Value Ref Range   Prothrombin Time 21.7 (H) 11.4 - 15.2 seconds   INR 1.9 (H) 0.8 - 1.2    Comment: (NOTE) INR goal varies  based on device and disease states. Performed at [facility], [address] [place], [place] [address]   Glucose, capillary     Status: None   Collection Time: 10/26/21  7:53 AM  Result Value Ref Range   Glucose-Capillary 79 70 - 99 mg/dL    Comment: Glucose reference range applies only to samples taken after fasting for at least 8 hours.  Glucose, capillary     Status: None   Collection Time: 10/26/21 12:17 PM  Result Value Ref Range   Glucose-Capillary 89 70 - 99 mg/dL    Comment: Glucose reference range applies only to samples taken after fasting for at least 8 hours.  Glucose, capillary     Status: Abnormal   Collection Time: 10/26/21  5:25 PM  Result Value Ref Range   Glucose-Capillary 192 (H) 70 - 99 mg/dL    Comment: Glucose reference range applies only to samples taken after fasting for at least 8 hours.   Comment 1 Notify RN    Comment 2 Document in Chart   Glucose, capillary     Status: Abnormal   Collection Time: 10/26/21  7:55 PM  Result Value Ref Range   Glucose-Capillary 190 (H) 70 - 99 mg/dL    Comment: Glucose reference range applies only to samples taken after fasting for at least 8 hours.  Glucose, capillary     Status: Abnormal   Collection Time: 10/26/21 11:41 PM  Result Value Ref Range   Glucose-Capillary 100 (H) 70 - 99 mg/dL    Comment: Glucose reference range applies only to samples taken after fasting for at least 8 hours.  Glucose, capillary     Status: None   Collection Time: 10/27/21  4:25 AM  Result Value Ref Range   Glucose-Capillary 94 70 - 99 mg/dL    Comment: Glucose reference range applies only to samples taken after fasting for at least 8 hours.  Comprehensive metabolic panel     Status: Abnormal   Collection Time: 10/27/21  5:08 AM  Result Value Ref Range   Sodium 129 (L) 135 - 145 mmol/L   Potassium 3.8 3.5 - 5.1 mmol/L   Chloride 98 98 - 111 mmol/L   CO2 21 (L) 22 - 32 mmol/L   Glucose, Bld 101 (H) 70 - 99 mg/dL    Comment: Glucose  reference range applies only to samples taken after fasting for at least 8 hours.   BUN 26 (H) 6 - 20 mg/dL   Creatinine, Ser 0.79 0.61 - 1.24 mg/dL   Calcium 9.5 8.9 - 10.3 mg/dL   Total Protein 4.8 (L) 6.5 - 8.1 g/dL   Albumin 2.6 (L) 3.5 - 5.0 g/dL   AST 55 (H) 15 - 41 U/L   ALT 34 0 - 44 U/L   Alkaline Phosphatase 427 (H) 38 - 126 U/L   Total Bilirubin 7.1 (H) 0.3 - 1.2 mg/dL   GFR, Estimated >60 >60 mL/min    Comment: (NOTE) Calculated using the CKD-EPI Creatinine Equation (2021)    Anion gap 10 5 - 15    Comment: Performed at [facility], [address] [place], [place] [address]  Ammonia     Status: Abnormal   Collection Time: 10/27/21  5:08 AM  Result Value Ref Range   Ammonia 36 (H) 9 - 35 umol/L    Comment: Performed at [facility], [address], [place],  [address]  CBC with Differential/Platelet     Status: Abnormal   Collection Time: 10/27/21  5:08 AM  Result Value Ref Range   WBC 103.9 (HH) 4.0 - 10.5 K/uL    Comment: REPEATED TO VERIFY WHITE COUNT CONFIRMED ON SMEAR THIS CRITICAL RESULT HAS VERIFIED AND BEEN CALLED TO LEEAN RN BY LATISHA HENDERSON ON 11 11 2022 AT 0545, AND HAS BEEN READ BACK.     RBC 3.26 (L) 4.22 - 5.81 MIL/uL   Hemoglobin 9.7 (L) 13.0 - 17.0 g/dL   HCT 29.2 (L) 39.0 - 52.0 %   MCV 89.6 80.0 - 100.0 fL   MCH 29.8 26.0 - 34.0 pg   MCHC 33.2 30.0 - 36.0 g/dL   RDW 21.9 (H) 11.5 - 15.5 %   Platelets 87 (L) 150 - 400 K/uL    Comment: SPECIMEN CHECKED FOR CLOTS Immature Platelet Fraction may be clinically indicated, consider ordering this additional test [id number] REPEATED TO VERIFY PLATELET COUNT CONFIRMED BY SMEAR    nRBC 0.0 0.0 - 0.2 %   Neutrophils Relative % 81 %   Neutro Abs 91.4 (H) 1.7 - 7.7 K/uL   Band Neutrophils 7 %   Lymphocytes Relative 3 %   Lymphs Abs 3.1 0.7 - 4.0 K/uL   Monocytes Relative 3 %   Monocytes Absolute 3.1 (H) 0.1 - 1.0 K/uL   Eosinophils Relative 0 %   Eosinophils Absolute 0.0 0.0 - 0.5 K/uL    Basophils Relative 0 %   Basophils Absolute 0.0 0.0 - 0.1 K/uL   WBC Morphology HYPERSEGMENTED NEUT     Comment: MILD LEFT SHIFT (1-5% METAS, OCC MYELO, OCC BANDS) VACUOLATED NEUTROPHILS    RBC Morphology RARE NUCLEATED RED BLOOD CELLS    Smear Review PLATELET COUNT CONFIRMED BY SMEAR    Metamyelocytes Relative 3 %   Myelocytes 3 %   Hypersegmented Neutrophils PRESENT    Polychromasia PRESENT    Ovalocytes PRESENT     Comment: Performed at [facility], [address], [place], [place] [address]  Protime-INR     Status: Abnormal   Collection Time: 10/27/21  5:08 AM  Result Value Ref Range   Prothrombin Time 22.6 (H) 11.4 - 15.2 seconds   INR 2.0 (H) 0.8 - 1.2    Comment: (NOTE) INR goal varies based on device and disease states. Performed at [facility], [address], [place], [place] [address]   Glucose, capillary     Status: None   Collection Time: 10/27/21  7:21 AM  Result Value Ref Range   Glucose-Capillary 96 70 - 99 mg/dL    Comment: Glucose reference range applies only to samples taken after fasting for at least 8 hours.  Glucose, capillary     Status: Abnormal   Collection Time: 10/27/21 11:15 AM  Result Value Ref Range   Glucose-Capillary 225 (H) 70 - 99 mg/dL    Comment: Glucose reference range applies only to samples taken after fasting for at least 8 hours.  Gram stain     Status: None   Collection Time: 10/27/21 12:42 PM   Specimen: Peritoneal Washings  Result Value Ref Range   Specimen Description PERITONEAL    Special Requests NONE    Gram Stain      WBC PRESENT,BOTH PMN AND MONONUCLEAR CYTOSPIN SMEAR NO ORGANISMS SEEN Performed at [facility], [address] [place], [place] [address]    Report Status 10/27/2021 FINAL   Culture, body fluid w Gram Stain-bottle     Status: None (Preliminary result)   Collection Time: 10/27/21 12:42 PM   Specimen: Peritoneal Washings  Result  Value Ref Range   Specimen Description PERITONEAL BOTTLES DRAWN AEROBIC AND  ANAEROBIC    Special Requests      10CC Performed at [facility], [address] [place], [place] [address]    Culture PENDING    Report Status PENDING    US Paracentesis  Result Date: 10/27/2021 Lavonia Dana, MD     10/27/2021  1:41 PM PreOperative Dx: Cirrhosis, ascites Postoperative Dx: Cirrhosis, ascites Procedure:   US guided paracentesis Radiologist:  Thornton Papas Anesthesia:  10 ml of1% lidocaine Specimen:  3.8 L of yellow ascitic fluid EBL:   < 1 ml Complications: None    DG CHEST PORT 1 VIEW  Result Date: 10/26/2021 CLINICAL DATA:  Leukocytosis.  Cough and short of breath EXAM: PORTABLE CHEST 1 VIEW COMPARISON:  10/23/2021 FINDINGS: Right lower left airspace disease and right pleural effusion unchanged from the prior study. Small left effusion unchanged. Heart size and vascularity normal.  Negative for heart failure. IMPRESSION: No significant interval change. Right lower lobe airspace disease and right pleural effusion unchanged Electronically Signed   By: Franchot Gallo M.D.   On: 10/26/2021 14:11     MEDICATIONS: I have reviewed the patient's current medications.     Assessment:  1.  Leukemoid reaction: - White count on presentation 137, left shift.  White count today improved to 100.3. - Prior white count on 07/24/2021 was normal at 5.4. - Blood cultures from 10/23/2021 showed aerobic and anaerobic-gram-positive cocci in clusters - He is being treated for SBP.  2.  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  Cirrhosis complicated by ascites and nonbleeding esophageal varices, status post banding (11/2019 EGD showed G2-3 varices) at [facility].  No known hepatic encephalopathy. - CTAP on 10/23/2021 without contrast showed hypoattenuating liver lesions, suspicious for multifocal HCC. - AFP elevated at 64.3 (10/23/2021).  AFP 9.3 (08/02/2021) at [facility]. - Ultrasound Doppler of the liver on 10/24/2021 showed complete occlusion  of the main and intrahepatic portal veins, unclear if it is plan thrombus or tumor thrombus.  Questionable thrombus within the IVC.  Small cirrhotic liver with heavily nodular contour.  No discrete hepatic mass lesion identified. - Paracentesis of 3.6 L on 10/24/2021. - EGD on 10/25/2021-hypopharynx and proximal esophagus normal.  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  5 bands were successfully placed with incomplete eradication of varices.  There was no bleeding during and at the end of the procedure.  Moderate portal hypertensive gastropathy found in the gastric fundus and body.  Multiple 4 to 9 mm apparently did not sessile polyps with no bleeding and no stigmata of recent bleeding.  Duodenum bulb and second portion normal. - MRI of the liver with and without contrast on 10/24/2021 showed 9.5 cm infiltrating enhancing mass in the posterior right hepatic dome, centered in segment 7, compatible with HCC.  Additional 2.1 cm enhancing lesion in segment 5 also compatible with HCC.  3.  Social/family history: - Lives at home with his daughter who works as an Therapist, sports at [employer].  He used to work as a Designer, industrial/product at [employer].  Quit smoking several years ago. - Maternal grand mother had pancreatic cancer.  Maternal grandfather had lung cancer.  Paternal grandfather had lung cancer    Plan:  1.  Leukemoid reaction: - Likely from underlying SBP and HCC. - He will continue 5-day course of IV antibiotics for SBP.  2.  Multifocal  HCC: - Not a candidate for transplant or any liver directed intervention. - Based on child class C, he is not a candidate for any systemic therapy at this time. - His bilirubin has slightly improved and his LFTs also slightly improved today. - If there is any improvement in his LFTs, if he becomes child class A/B systemic therapy in the form of immunotherapy can be offered. - If he is not going home with hospice, follow-up with me in the office for  repeat labs and reevaluation.  If there is no improvement, will recommend hospice at that time.  3.  Portal vein thrombosis: - EGD with grade 3 varices in the mid to distal esophagus, but 5 bands applied with incomplete eradication of varices.  Moderate portal hypertensive gastropathy. - Not a candidate for anticoagulation at this time.   All questions were answered. The patient knows to call the clinic with any problems, questions or concerns. We can certainly see the patient much sooner if necessary.     Derek Jack

## 2021-10-27 NOTE — TOC Progression Note (Signed)
Transition of Care [facility]) - Progression Note    Patient Details  Name: Lawrence Rogers MRN: [id number] Date of Birth: Dec 27, 1960  Transition of Care [facility]) CM/SW Contact  Natasha Bence, LCSW Phone Number: 10/27/2021, 4:59 PM  Clinical Narrative:    CSW notified of patient's agreeableness to [facility] hospice. Patient reported he wished to discuss residential hospice vs home hospice. CSW referred patient to [place] and informed them of patient's questions. TOC to follow.   Expected Discharge Plan: [place] Barriers to Discharge: Continued Medical Work up  Expected Discharge Plan and Services Expected Discharge Plan: [place]: RN, PT, Nurse's Aide [facility] Agency: [place] Date [facility] Agency Contacted: 10/26/21 Time HH Agency Contacted: [phone]     Social Determinants of Health (SDOH) Interventions    Readmission Risk Interventions No flowsheet data found.

## 2021-10-28 LAB — COMPREHENSIVE METABOLIC PANEL
ALT: 30 U/L (ref 0–44)
AST: 58 U/L — ABNORMAL HIGH (ref 15–41)
Albumin: 2.7 g/dL — ABNORMAL LOW (ref 3.5–5.0)
Alkaline Phosphatase: 444 U/L — ABNORMAL HIGH (ref 38–126)
Anion gap: 11 (ref 5–15)
BUN: 26 mg/dL — ABNORMAL HIGH (ref 6–20)
CO2: 22 mmol/L (ref 22–32)
Calcium: 9.8 mg/dL (ref 8.9–10.3)
Chloride: 98 mmol/L (ref 98–111)
Creatinine, Ser: 0.79 mg/dL (ref 0.61–1.24)
GFR, Estimated: >60 mL/min (ref >60)
Glucose, Bld: 79 mg/dL (ref 70–99)
Potassium: 3.4 mmol/L — ABNORMAL LOW (ref 3.5–5.1)
Sodium: 131 mmol/L — ABNORMAL LOW (ref 135–145)
Total Bilirubin: 7.2 mg/dL — ABNORMAL HIGH (ref 0.3–1.2)
Total Protein: 4.9 g/dL — ABNORMAL LOW (ref 6.5–8.1)

## 2021-10-28 LAB — CULTURE, BLOOD (SINGLE): Culture: NO GROWTH

## 2021-10-28 LAB — CULTURE, BODY FLUID W GRAM STAIN -BOTTLE
Culture: NO GROWTH
Special Requests: ADEQUATE

## 2021-10-28 LAB — HEMOGLOBIN AND HEMATOCRIT, BLOOD
HCT: 28.5 % — ABNORMAL LOW (ref 39.0–52.0)
Hemoglobin: 9.6 g/dL — ABNORMAL LOW (ref 13.0–17.0)

## 2021-10-28 NOTE — TOC Progression Note (Signed)
Transition of Care [facility]) - Progression Note    Patient Details  Name: Lawrence Rogers MRN: [id number] Date of Birth: [birth year]  Transition of Care [facility]) CM/SW Contact  Natasha Bence, LCSW Phone Number: 10/28/2021, 1:31 PM  Clinical Narrative:    CSW called patient to follow up with hospice decision. Patient reported that [place] has not yet reached out to him to discuss residential hospice versus home hospice. AC reported no residential hospice beds available. Patient reported that he currently will not have anyone home with him during his daughter's work ours for home hospice. TOC to follow.    Expected Discharge Plan: [place] Barriers to Discharge: Continued Medical Work up  Expected Discharge Plan and Services Expected Discharge Plan: [place]: RN, PT, Nurse's Aide [facility] Agency: [place] Date [facility] Agency Contacted: 10/26/21 Time HH Agency Contacted: [phone]     Social Determinants of Health (SDOH) Interventions    Readmission Risk Interventions No flowsheet data found.

## 2021-10-28 NOTE — Progress Notes (Signed)
PROGRESS NOTE   Lawrence Rogers  VQX:[id number] DOB: 1961-09-07 DOA: 10/23/2021 PCP: [name], MD   No chief complaint on file.   Level of care: Med-Surg  Brief Admission History:  Lawrence Rogers is a 60 y.o. 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  At presentation to ED, patient reported weakness, denies syncope, loss of consciousness or dizziness. CT abdomen and pelvis showed no posttraumatic deformities. Patient was fluid overloaded with ascites.  Labs at that time showed a leukocytosis 137.1, concerning for malignancy.  Patient MRI was concerning for hepatocellular carcinoma.  Both GI and oncology has been consulted.  Patient is status post large-volume paracentesis of 3.6 L on 11/8. Fluid analysis consistent with SBP, gram stain with gram-positive cocci in clusters; Continue IV antibiotics.  Imaging for falls showed posttraumatic injuries.  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  Subsequent MRI showed 9.5 cm infiltrating enhancing mass.  Palliative care has seen patient; patient now DNR.  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  Assessment & Plan:   Active Problems:   Cirrhosis of liver with ascites (HCC)   Cirrhosis of liver (HCC)   Ascites   SBP (spontaneous bacterial peritonitis) (HCC)   Liver mass   Hyperammonemia (HCC)   Peripheral edema   Protein-calorie malnutrition, severe   Leukemoid reaction   Hepatocellular carcinoma (HCC)   Decompensated cirrhosis with SBP -Significant volume overload with noted noncompliance with diuretics at presentation. -S/p paracentesis on 11/7, per GI performed with over 3 L removed. -fluid analysis consistent with SBP (355 PMNs); gram stain with gram-positive cocci in clusters. -Cefepime, vancomycin discontinued -Zosyn was added to  patient's regimen. 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  Subsequent MRI showed 9.5 cm infiltrating enhancing mass.  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 If that improves to A/B he may be able to have chemo therapy.   -Per 11/9 oncology note, patient likely available for transplant: Patient will be offered systemic therapy [facility] -Repeat Paracentesis 11/11 with Dr. Thornton Papas for therapeutic purposes. Pt tolerated with good results.     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  Awaiting for [facility] hospice to come and screen him.   Transaminitis secondary to above -MRI concerning for hepatocellular carcinoma -Labs stable/slightly elevated AST 65>>76>>74, alk phos 386>>418>>437, total bilirubin 5.4>>6.2>>7.5   Progressive weakness/debility with fall-multifactorial -No acute injuries noted -PT/OT evaluation -Fall precautions   Leukocytosis -Oncology has been consulted; believe leukocytosis is secondary to SBP. -WBC increasing; 86.6 >> 101.0 -Continue antibiotics as stated above -repeat CXR with no signs of infection found.    Lactic acidosis - resolved now -Likely in the setting of  hematological malignancy and not likely sepsis physiology   Hyponatremia/AKI -Likely in the setting of prerenal  causes with suspicion of hepatorenal syndrome -IV albumin as ordered per GI -Continue to monitor strict I's and O's along with daily weights -Avoid nephrotoxic agents -Spironolactone as stated above -Sodium 129 .  Secondary to volume overload.    DVT prophylaxis: SCD Code Status: Code Family Communication: Patient stepdaughter Disposition:  Status is: Inpatient  Remains inpatient appropriate because: Oncology work-up and further treatment of SBP.   Consultants:  GI and oncology  Procedures:  Large-volume paracentesis  Antimicrobials:  Cefepime Vancomycin  Subjective: Patient wanting to go home but not able to secure 24/7 supervision that he needs.  Willing to meet with [facility] hospice for more information.    Objective: Vitals:   10/27/21 2053 10/28/21 0616 10/28/21 1400 10/28/21 1414  BP: (!) 122/50 132/70 98/71   Pulse: (!) 106 64 (!) 117 89  Resp: _0 Temp: 98.2 F (36.8 C) 99.5 F (37.5 C) 98.1 F (36.7 C)   TempSrc: Oral  Oral   SpO2: 95% 95% 96%   Weight:  101.5 kg    Height:        Intake/Output Summary (Last 24 hours) at 10/28/2021 1606 Last data filed at 10/28/2021 1300 Gross per 24 hour  Intake 1440 ml  Output 1550 ml  Net -110 ml   Filed Weights   10/26/21 0500 10/27/21 0429 10/28/21 0616  Weight: 101.4 kg 101.3 kg 101.5 kg    Examination:  General exam: Appears calm and comfortable.  Appears very ill, appears jaundiced, appears older than stated age.  Respiratory system: Clear to auscultation. Respiratory effort normal. Cardiovascular system: normal S1 & S2 heard. No JVD, murmurs, rubs, gallops or clicks.  +3 edema present  gastrointestinal system: Abdomen extended, nontender Central nervous system: Alert and oriented. No focal neurological deficits. Skin: Jaundice  psychiatry: Judgement and insight appear normal. Mood & affect appropriate.   Data Reviewed: I have personally reviewed following labs and imaging studies  CBC: Recent  Labs  Lab 10/23/21 1044 10/24/21 0517 10/25/21 0506 10/26/21 0521 10/27/21 0508 10/28/21 0500  WBC 137.1* 100.3* 86.6* 101.8* 103.9*  --   NEUTROABS 128.9*  --  78.8* 94.7* 91.4*  --   HGB 11.6* 9.4* 9.2* 9.9* 9.7* 9.6*  HCT 36.2* 28.9* 27.0* 30.6* 29.2* 28.5*  MCV 89.8 89.8 87.1 89.7 89.6  --   PLT 111* 76* 76* 78* 87*  --     Basic Metabolic Panel: Recent Labs  Lab 10/24/21 0517 10/25/21 0506 10/26/21 0521 10/27/21 0508 10/28/21 0500  NA 129* 131* 129* 129* 131*  K 3.9 3.3* 3.9 3.8 3.4*  CL 96* 95* 98 98 98  CO2 26 23 20* 21* 22  GLUCOSE 47* 50* 75 101* 79  BUN 37* 32* 26* 26* 26*  CREATININE 1.18 0.94 0.75 0.79 0.79  CALCIUM 8.6* 9.2 9.3 9.5 9.8  MG 2.2 2.4  --   --   --     GFR: Estimated Creatinine Clearance: 126.4 mL/min (by C-G formula based on SCr of 0.79 mg/dL).  Liver Function Tests: Recent Labs  Lab 10/24/21 0517 10/25/21 0506 10/26/21 0521 10/27/21 0508 10/28/21 0500  AST 65* 76* 74* 55* 58*  ALT 29 33 33 34 30  ALKPHOS 386* 418* 437* 427* 444*  BILITOT 5.1*  5.4* 6.2* 7.5* 7.1* 7.2*  PROT 4.7* 5.2* 4.9* 4.8* 4.9*  ALBUMIN 2.3* 2.9* 2.6* 2.6* 2.7*    CBG: Recent  Labs  Lab 10/27/21 0425 10/27/21 0721 10/27/21 1115 10/27/21 1612 10/27/21 2051  GLUCAP 94 96 225* 156* 170*    Recent Results (from the past 240 hour(s))  Culture, blood (single)     Status: None   Collection Time: 10/23/21 12:52 PM   Specimen: Right Antecubital; Blood  Result Value Ref Range Status   Specimen Description   Final    RIGHT ANTECUBITAL Performed at [facility], [address], [place], [place] [address]    Special Requests   Final    BOTTLES DRAWN AEROBIC AND ANAEROBIC Blood Culture adequate volume Performed at [facility], [address], [place], [place] [address]    Culture  Setup Time   Final    AEB AND ANA BOTTLES Gram Stain Report Called to,Read Back By and Verified With: Coralie Common RN [phone] K FORSYTH CORRECTED RESULTS NO ORGANISMS  SEEN PREVIOUSLY REPORTED AS: GRAM POSITIVE COCCI IN CLUSTERS CORRECTED RESULTS CALLED TO: PHARMD S.HURTH AT 0315 ON 10/25/2021 BY T.SAAD. Performed at [facility], [place] [address] [place], [place] [address]    Culture   Final    NO GROWTH 4 DAYS Performed at [facility], [address], [place], [place] [address]    Report Status 10/27/2021 FINAL  Final  Blood Culture ID Panel (Reflexed)     Status: None   Collection Time: 10/23/21 12:52 PM  Result Value Ref Range Status   Enterococcus faecalis NOT DETECTED NOT DETECTED Final   Enterococcus Faecium NOT DETECTED NOT DETECTED Final   Listeria monocytogenes NOT DETECTED NOT DETECTED Final   Staphylococcus species NOT DETECTED NOT DETECTED Final   Staphylococcus aureus (BCID) NOT DETECTED NOT DETECTED Final   Staphylococcus epidermidis NOT DETECTED NOT DETECTED Final   Staphylococcus lugdunensis NOT DETECTED NOT DETECTED Final   Streptococcus species NOT DETECTED NOT DETECTED Final   Streptococcus agalactiae NOT DETECTED NOT DETECTED Final   Streptococcus pneumoniae NOT DETECTED NOT DETECTED Final   Streptococcus pyogenes NOT DETECTED NOT DETECTED Final   A.calcoaceticus-baumannii NOT DETECTED NOT DETECTED Final   Bacteroides fragilis NOT DETECTED NOT DETECTED Final   Enterobacterales NOT DETECTED NOT DETECTED Final   Enterobacter cloacae complex NOT DETECTED NOT DETECTED Final   Escherichia coli NOT DETECTED NOT DETECTED Final   Klebsiella aerogenes NOT DETECTED NOT DETECTED Final   Klebsiella oxytoca NOT DETECTED NOT DETECTED Final   Klebsiella pneumoniae NOT DETECTED NOT DETECTED Final   Proteus species NOT DETECTED NOT DETECTED Final   Salmonella species NOT DETECTED NOT DETECTED Final   Serratia marcescens NOT DETECTED NOT DETECTED Final   Haemophilus influenzae NOT DETECTED NOT DETECTED Final   Neisseria meningitidis NOT DETECTED NOT DETECTED Final   Pseudomonas aeruginosa NOT DETECTED NOT DETECTED Final    Stenotrophomonas maltophilia NOT DETECTED NOT DETECTED Final   Candida albicans NOT DETECTED NOT DETECTED Final   Candida auris NOT DETECTED NOT DETECTED Final   Candida glabrata NOT DETECTED NOT DETECTED Final   Candida krusei NOT DETECTED NOT DETECTED Final   Candida parapsilosis NOT DETECTED NOT DETECTED Final   Candida tropicalis NOT DETECTED NOT DETECTED Final   Cryptococcus neoformans/gattii NOT DETECTED NOT DETECTED Final    Comment: Performed at [facility], [place]. [address] [place],  [address]  Culture, blood (single)     Status: None   Collection Time: 10/23/21 12:53 PM   Specimen: BLOOD LEFT HAND  Result Value Ref Range Status   Specimen Description BLOOD LEFT HAND  Final  Special Requests   Final    BOTTLES DRAWN AEROBIC AND ANAEROBIC Blood Culture results may not be optimal due to an inadequate volume of blood received in culture bottles   Culture   Final    NO GROWTH 5 DAYS Performed at [facility], [address] [place], [place] [address]    Report Status 10/28/2021 FINAL  Final  Acid Fast Smear (AFB)     Status: None   Collection Time: 10/23/21  2:05 PM   Specimen: Peritoneal Cavity; Body Fluid  Result Value Ref Range Status   AFB Specimen Processing Concentration  Final   Acid Fast Smear Negative  Final    Comment: (NOTE) Performed At: [facility] [place], [place] [id number] Rush Farmer MD DV:[id number]    Source (AFB) PERITONEAL  Final    Comment: Performed at [facility], [address], [place], [place] [address]  Culture, body fluid w Gram Stain-bottle     Status: None   Collection Time: 10/23/21  2:05 PM   Specimen: Peritoneal Washings  Result Value Ref Range Status   Specimen Description PERITONEAL  Final   Special Requests   Final    BOTTLES DRAWN AEROBIC AND ANAEROBIC Blood Culture adequate volume   Culture   Final    NO GROWTH 5 DAYS Performed at [facility], [address], [place], [place] [address]     Report Status 10/28/2021 FINAL  Final  Gram stain     Status: None   Collection Time: 10/23/21  2:05 PM   Specimen: Peritoneal Washings  Result Value Ref Range Status   Specimen Description PERITONEAL  Final   Special Requests NONE  Final   Gram Stain   Final    GRAM POSITIVE COCCI IN CLUSTERS CYTOSPIN SMEAR Gram Stain Report Called to,Read Back By and Verified With: Coralie Common RN [phone] K FORSYTH Performed at [facility], [address] [place], [place] [address]    Report Status 10/23/2021 FINAL  Final  Resp Panel by RT-PCR (Flu A&B, Covid) Nasopharyngeal Swab     Status: None   Collection Time: 10/23/21  2:10 PM   Specimen: Nasopharyngeal Swab; Nasopharyngeal(NP) swabs in vial transport medium  Result Value Ref Range Status   SARS Coronavirus 2 by RT PCR NEGATIVE NEGATIVE Final    Comment: (NOTE) SARS-CoV-2 target nucleic acids are NOT DETECTED.  The SARS-CoV-2 RNA is generally detectable in upper respiratory specimens during the acute phase of infection. The lowest concentration of SARS-CoV-2 viral copies this assay can detect is 138 copies/mL. A negative result does not preclude SARS-Cov-2 infection and should not be used as the sole basis for treatment or other patient management decisions. A negative result may occur with  improper specimen collection/handling, submission of specimen other than nasopharyngeal swab, presence of viral mutation(s) within the areas targeted by this assay, and inadequate number of viral copies(<138 copies/mL). A negative result must be combined with clinical observations, patient history, and epidemiological information. The expected result is Negative.  Fact Sheet for Patients:  EntrepreneurPulse.com.au  Fact Sheet for Healthcare Providers:  IncredibleEmployment.be  This test is no t yet approved or cleared by the [place] FDA and  has been authorized for detection and/or diagnosis of SARS-CoV-2  by FDA under an Emergency Use Authorization (EUA). This EUA will remain  in effect (meaning this test can be used) for the duration of the COVID-19 declaration under Section 564(b)(1) of the Act, 21 U.S.C.section 360bbb-3(b)(1), unless the authorization is terminated  or revoked sooner.       Influenza A by PCR NEGATIVE NEGATIVE Final   Influenza B by PCR NEGATIVE NEGATIVE Final    Comment: (NOTE) The Xpert Xpress SARS-CoV-2/FLU/RSV plus assay is intended as an aid in the diagnosis of influenza from Nasopharyngeal swab specimens and should not be used as a sole basis for treatment. Nasal washings and aspirates are unacceptable for Xpert Xpress SARS-CoV-2/FLU/RSV testing.  Fact Sheet for Patients: EntrepreneurPulse.com.au  Fact Sheet for Healthcare Providers: IncredibleEmployment.be  This test is not yet approved or cleared by the [place] FDA and has been authorized for detection and/or diagnosis of SARS-CoV-2 by FDA under an Emergency Use Authorization (EUA). This EUA will remain in effect (meaning this test can be used) for the duration of the COVID-19 declaration under Section 564(b)(1) of the Act, 21 U.S.C. section 360bbb-3(b)(1), unless the authorization is terminated or revoked.  Performed at [facility], [address], [place], [place] [address]   MRSA Next Gen by PCR, Nasal     Status: None   Collection Time: 10/23/21  6:17 PM   Specimen: Nasal Mucosa; Nasal Swab  Result Value Ref Range Status   MRSA by PCR Next Gen NOT DETECTED NOT DETECTED Final    Comment: (NOTE) The GeneXpert MRSA Assay (FDA approved for NASAL specimens only), is one component of a comprehensive MRSA colonization surveillance program. It is not intended to diagnose MRSA infection nor to guide or monitor treatment for MRSA infections. Test performance is not FDA approved in patients less than 43 years old. Performed at [facility], [address],  [place], [place] [address]   Gram stain     Status: None   Collection Time: 10/27/21 12:42 PM   Specimen: Peritoneal Washings  Result Value Ref Range Status   Specimen Description PERITONEAL  Final   Special Requests NONE  Final   Gram Stain   Final    WBC PRESENT,BOTH PMN AND MONONUCLEAR CYTOSPIN SMEAR NO ORGANISMS SEEN Performed at [facility], [address], [place], [place] [address]    Report Status 10/27/2021 FINAL  Final  Culture, body fluid w Gram Stain-bottle     Status: None (Preliminary result)   Collection Time: 10/27/21 12:42 PM   Specimen: Peritoneal Washings  Result Value Ref Range Status   Specimen Description PERITONEAL BOTTLES DRAWN AEROBIC AND ANAEROBIC  Final   Special Requests 10CC  Final   Culture   Final    NO GROWTH < 24 HOURS Performed at [facility], [address] [place], [place] [address]    Report Status PENDING  Incomplete      Radiology Studies: US Paracentesis  Result Date: 10/27/2021 Lavonia Dana, MD     10/27/2021  1:41 PM PreOperative Dx: Cirrhosis, ascites Postoperative Dx: Cirrhosis, ascites Procedure:   US guided paracentesis Radiologist:  Thornton Papas Anesthesia:  10 ml of1% lidocaine Specimen:  3.8 L of yellow ascitic fluid EBL:   < 1 ml Complications: None     Scheduled Meds:  sodium chloride   Intravenous Once   bumetanide  1 mg Oral Daily   feeding supplement  237 mL Oral BID BM   ferrous sulfate  325 mg Oral Q breakfast   lactulose  10 g Oral BID   pantoprazole  40 mg Oral QAC breakfast   sodium chloride flush  3 mL Intravenous Q12H   spironolactone  50 mg Oral Daily   Continuous Infusions:  sodium chloride 250 mL (10/23/21 2057)   sodium chloride  10 mL (10/24/21 0103)   piperacillin-tazobactam (ZOSYN)  IV 3.375 g (10/28/21 1347)     LOS: 5 days   Time spent: 57 mins  Cheyrl Buley Wynetta Emery, MD  How to contact the [facility] Attending or Consulting provider [place] or covering provider during after hours [place], for this patient?  Check the  care team in [facility] and look for a) attending/consulting [facility] provider listed and b) the [facility] team listed Log into [email or web address] and use [place] universal password to access. If you do not have the password, please contact the hospital operator. Locate the [facility] provider you are looking for under [facility] and page to a number that you can be directly reached. If you still have difficulty reaching the provider, please page the [facility] (Director on Call) for the Hospitalists listed on amion for assistance.'   10/28/2021, 4:06 PM

## 2021-10-29 LAB — COMPREHENSIVE METABOLIC PANEL
ALT: 34 U/L (ref 0–44)
AST: 65 U/L — ABNORMAL HIGH (ref 15–41)
Albumin: 2.6 g/dL — ABNORMAL LOW (ref 3.5–5.0)
Alkaline Phosphatase: 485 U/L — ABNORMAL HIGH (ref 38–126)
Anion gap: 12 (ref 5–15)
BUN: 24 mg/dL — ABNORMAL HIGH (ref 6–20)
CO2: 22 mmol/L (ref 22–32)
Calcium: 9.8 mg/dL (ref 8.9–10.3)
Chloride: 96 mmol/L — ABNORMAL LOW (ref 98–111)
Creatinine, Ser: 0.7 mg/dL (ref 0.61–1.24)
GFR, Estimated: >60 mL/min (ref >60)
Glucose, Bld: 41 mg/dL — CL (ref 70–99)
Potassium: 3.5 mmol/L (ref 3.5–5.1)
Sodium: 130 mmol/L — ABNORMAL LOW (ref 135–145)
Total Bilirubin: 7.6 mg/dL — ABNORMAL HIGH (ref 0.3–1.2)
Total Protein: 5.1 g/dL — ABNORMAL LOW (ref 6.5–8.1)

## 2021-10-29 LAB — GLUCOSE, CAPILLARY: Glucose-Capillary: 232 mg/dL — ABNORMAL HIGH (ref 70–99)

## 2021-10-29 LAB — TYPE AND SCREEN
ABO/RH(D): O POS
Antibody Screen: NEGATIVE
Unit division: 0

## 2021-10-29 LAB — BPAM RBC
Blood Product Expiration Date: 202212122359
Unit Type and Rh: 5100

## 2021-10-29 MED ORDER — CIPROFLOXACIN HCL 250 MG PO TABS
500.0000 mg | ORAL_TABLET | Freq: Every day | ORAL | Status: DC
  Administered 2021-10-29 – 2021-10-31 (×3): 500 mg via ORAL
  Filled 2021-10-29 (×3): qty 2

## 2021-10-29 MED ORDER — DEXTROSE 50 % IV SOLN
50.0000 mL | Freq: Once | INTRAVENOUS | Status: AC
  Administered 2021-10-29: 50 mL via INTRAVENOUS
  Filled 2021-10-29: qty 50

## 2021-10-29 NOTE — Plan of Care (Signed)
  Problem: Education: Goal: Knowledge of General Education information will improve Description: Including pain rating scale, medication(s)/side effects and non-pharmacologic comfort measures 10/29/2021 0451 by Cyndra Numbers, RN Outcome: Progressing 10/29/2021 0451 by Cyndra Numbers, RN Outcome: Progressing   Problem: Health Behavior/Discharge Planning: Goal: Ability to manage health-related needs will improve 10/29/2021 0451 by Cyndra Numbers, RN Outcome: Progressing 10/29/2021 0451 by Cyndra Numbers, RN Outcome: Progressing   Problem: Clinical Measurements: Goal: Ability to maintain clinical measurements within normal limits will improve 10/29/2021 0451 by Cyndra Numbers, RN Outcome: Progressing 10/29/2021 0451 by Cyndra Numbers, RN Outcome: Progressing Goal: Will remain free from infection 10/29/2021 0451 by Cyndra Numbers, RN Outcome: Progressing 10/29/2021 0451 by Cyndra Numbers, RN Outcome: Progressing Goal: Diagnostic test results will improve 10/29/2021 0451 by Cyndra Numbers, RN Outcome: Progressing 10/29/2021 0451 by Cyndra Numbers, RN Outcome: Progressing Goal: Respiratory complications will improve 10/29/2021 0451 by Cyndra Numbers, RN Outcome: Progressing 10/29/2021 0451 by Cyndra Numbers, RN Outcome: Progressing Goal: Cardiovascular complication will be avoided 10/29/2021 0451 by Cyndra Numbers, RN Outcome: Progressing 10/29/2021 0451 by Cyndra Numbers, RN Outcome: Progressing   Problem: Activity: Goal: Risk for activity intolerance will decrease 10/29/2021 0451 by Cyndra Numbers, RN Outcome: Progressing 10/29/2021 0451 by Cyndra Numbers, RN Outcome: Progressing   Problem: Nutrition: Goal: Adequate nutrition will be maintained 10/29/2021 0451 by Cyndra Numbers, RN Outcome: Progressing 10/29/2021 0451 by Cyndra Numbers, RN Outcome: Progressing   Problem: Coping: Goal: Level of anxiety will decrease 10/29/2021 0451 by Cyndra Numbers, RN Outcome: Progressing 10/29/2021 0451 by Cyndra Numbers, RN Outcome: Progressing    Problem: Elimination: Goal: Will not experience complications related to bowel motility 10/29/2021 0451 by Cyndra Numbers, RN Outcome: Progressing 10/29/2021 0451 by Cyndra Numbers, RN Outcome: Progressing Goal: Will not experience complications related to urinary retention 10/29/2021 0451 by Cyndra Numbers, RN Outcome: Progressing 10/29/2021 0451 by Cyndra Numbers, RN Outcome: Progressing   Problem: Pain Managment: Goal: General experience of comfort will improve 10/29/2021 0451 by Cyndra Numbers, RN Outcome: Progressing 10/29/2021 0451 by Cyndra Numbers, RN Outcome: Progressing   Problem: Safety: Goal: Ability to remain free from injury will improve 10/29/2021 0451 by Cyndra Numbers, RN Outcome: Progressing 10/29/2021 0451 by Cyndra Numbers, RN Outcome: Progressing   Problem: Skin Integrity: Goal: Risk for impaired skin integrity will decrease 10/29/2021 0451 by Cyndra Numbers, RN Outcome: Progressing 10/29/2021 0451 by Cyndra Numbers, RN Outcome: Progressing

## 2021-10-29 NOTE — Plan of Care (Signed)

## 2021-10-29 NOTE — Progress Notes (Addendum)
PROGRESS NOTE   Lawrence Rogers  BWI:[id number] DOB: 08/05/1961 DOA: 10/23/2021 PCP: [name], MD   No chief complaint on file.   Level of care: Med-Surg  Brief Admission History:  Lawrence Rogers is a 60 y.o. 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  At presentation to ED, patient reported weakness, denies syncope, loss of consciousness or dizziness. CT abdomen and pelvis showed no posttraumatic deformities. Patient was fluid overloaded with ascites.  Labs at that time showed a leukocytosis 137.1, concerning for malignancy.  Patient MRI was concerning for hepatocellular carcinoma.  Both GI and oncology has been consulted.  Patient is status post large-volume paracentesis of 3.6 L on 11/8. Fluid analysis consistent with SBP, gram stain with gram-positive cocci in clusters; Continue IV antibiotics.  Imaging for falls showed posttraumatic injuries.  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  Subsequent MRI showed 9.5 cm infiltrating enhancing mass.  Palliative care has seen patient; patient now DNR.  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  Assessment & Plan:   Active Problems:   Cirrhosis of liver with ascites (HCC)   Cirrhosis of liver (HCC)   Ascites   SBP (spontaneous bacterial peritonitis) (HCC)   Liver mass   Hyperammonemia (HCC)   Peripheral edema   Protein-calorie malnutrition, severe   Leukemoid reaction   Hepatocellular carcinoma (HCC)   Decompensated cirrhosis with SBP -Significant volume overload with noted noncompliance with diuretics at presentation. -S/p paracentesis on 11/7, per GI performed with over 3 L removed. -fluid analysis consistent with SBP (355 PMNs); gram stain with gram-positive cocci in clusters. -Cefepime, vancomycin discontinued -Zosyn was added to  patient's regimen.  On 11/13 started daily prophylaxis ciprofloxacin 500 mg daily.   - IV albumin given by GI service thru 11/13.  -GI team resuming diuretics: Bumex 1 mg started 11/11 -AKI improved; continue spironolactone.   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  Subsequent MRI showed 9.5 cm infiltrating enhancing mass.  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 If that improves to A/B he may be able to have chemo therapy.   -Per 11/9 oncology note, patient likely available for transplant: Patient will be offered systemic therapy [facility] -Repeat Paracentesis 11/11 with Dr. Thornton Papas for therapeutic purposes. Pt tolerated with good results.     Hepatocellular Carcinoma - Pt decided to pursue residential hospice placement.    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  - difficult choices ahead - if we can't secure 24/7 supervision/care for him at home, he will need other arrangements; best scenario would be for him to go to residential hospice.  Awaiting for [facility] hospice to come and screen him. -11/13: pt is agreeable to going to residential hospice.    Transaminitis secondary to above -MRI concerning for hepatocellular carcinoma -Labs stable/slightly elevated AST 65>>76>>74, alk phos 386>>418>>437, total bilirubin 5.4>>6.2>>7.5 -no further lab draws as plan is to get him to residential hospice.    Progressive weakness/debility with fall-multifactorial -No acute injuries noted -residential hospice placement in progress -Fall precautions  Leukocytosis - Leukemoid reaction  -Oncology has been consulted; believe leukocytosis is secondary to SBP. -WBC increasing; 86.6 >> 101.0 -Continue  antibiotics as stated above -repeat CXR with no signs of infection found. -stop further blood draws and focus on residential hospice placement.  Vitals once daily only for comfort.    Lactic acidosis - resolved now -Likely in the setting of hematological malignancy and not likely sepsis physiology   Hyponatremia/AKI -Likely in the setting of prerenal causes with suspicion of hepatorenal syndrome -IV albumin as ordered per GI to complete 11/13.  -Continue to monitor strict I's and O's along with daily weights -Avoid nephrotoxic agents -Spironolactone as stated above -Bumex 1 mg daily started by GI team.  -Secondary to volume overload.    DVT prophylaxis: SCD Code Status: DNR  Family Communication: Patient stepdaughter telephone update 11/12 Disposition: residential hospice  Status is: Inpatient  Pt is medically stable to discharge to residential hospice, currently awaiting hospice bed availability    Consultants:  GI and oncology  Procedures:  Large-volume paracentesis  Antimicrobials:  Cefepime Vancomycin  Subjective: Patient now is fully agreeable to residential hospice placement   Objective: Vitals:   10/28/21 1400 10/28/21 1414 10/28/21 2111 10/29/21 0500  BP: 98/71  (!) 88/61 95/71  Pulse: (!) 117 89 100 99  Resp: _0 Temp: 98.1 F (36.7 C)  97.9 F (36.6 C) 98.1 F (36.7 C)  TempSrc: Oral  Oral Oral  SpO2: 96%  94% 96%  Weight:    100.4 kg  Height:        Intake/Output Summary (Last 24 hours) at 10/29/2021 1019 Last data filed at 10/29/2021 0900 Gross per 24 hour  Intake 683.73 ml  Output 950 ml  Net -266.27 ml   Filed Weights   10/27/21 0429 10/28/21 0616 10/29/21 0500  Weight: 101.3 kg 101.5 kg 100.4 kg    Examination:  General exam: Appears calm and comfortable.  Appears very ill, appears jaundiced, appears older than stated age.  Respiratory system: Clear to auscultation. Respiratory effort normal. Cardiovascular system: normal S1 &  S2 heard. No JVD, murmurs, rubs, gallops or clicks.  +3 edema bilateral feet and legs  gastrointestinal system: Abdomen extended, nontender Central nervous system: Alert and oriented. No focal neurological deficits. Skin: Jaundice  psychiatry: Judgement and insight appear normal. Mood & affect appropriate.   Data Reviewed: I have personally reviewed following labs and imaging studies  CBC: Recent Labs  Lab 10/23/21 1044 10/24/21 0517 10/25/21 0506 10/26/21 0521 10/27/21 0508 10/28/21 0500  WBC 137.1* 100.3* 86.6* 101.8* 103.9*  --   NEUTROABS 128.9*  --  78.8* 94.7* 91.4*  --   HGB 11.6* 9.4* 9.2* 9.9* 9.7* 9.6*  HCT 36.2* 28.9* 27.0* 30.6* 29.2* 28.5*  MCV 89.8 89.8 87.1 89.7 89.6  --   PLT 111* 76* 76* 78* 87*  --     Basic Metabolic Panel: Recent Labs  Lab 10/24/21 0517 10/25/21 0506 10/26/21 0521 10/27/21 0508 10/28/21 0500 10/29/21 0413  NA 129* 131* 129* 129* 131* 130*  K 3.9 3.3* 3.9 3.8 3.4* 3.5  CL 96* 95* 98 98 98 96*  CO2 26 23 20* 21* 22 22  GLUCOSE 47* 50* 75 101* 79 41*  BUN 37* 32* 26* 26* 26* 24*  CREATININE 1.18 0.94 0.75 0.79 0.79 0.70  CALCIUM 8.6* 9.2 9.3 9.5 9.8 9.8  MG 2.2 2.4  --   --   --   --     GFR: Estimated  Creatinine Clearance: 125.9 mL/min (by C-G formula based on SCr of 0.7 mg/dL).  Liver Function Tests: Recent Labs  Lab 10/25/21 0506 10/26/21 0521 10/27/21 0508 10/28/21 0500 10/29/21 0413  AST 76* 74* 55* 58* 65*  ALT 33 33 34 30 34  ALKPHOS 418* 437* 427* 444* 485*  BILITOT 6.2* 7.5* 7.1* 7.2* 7.6*  PROT 5.2* 4.9* 4.8* 4.9* 5.1*  ALBUMIN 2.9* 2.6* 2.6* 2.7* 2.6*    CBG: Recent Labs  Lab 10/27/21 0425 10/27/21 0721 10/27/21 1115 10/27/21 1612 10/27/21 2051  GLUCAP 94 96 225* 156* 170*    Recent Results (from the past 240 hour(s))  Culture, blood (single)     Status: None   Collection Time: 10/23/21 12:52 PM   Specimen: Right Antecubital; Blood  Result Value Ref Range Status   Specimen Description   Final     RIGHT ANTECUBITAL Performed at [facility], [address], [place], [place] [address]    Special Requests   Final    BOTTLES DRAWN AEROBIC AND ANAEROBIC Blood Culture adequate volume Performed at [facility], [address] [place], [place] [address]    Culture  Setup Time   Final    AEB AND ANA BOTTLES Gram Stain Report Called to,Read Back By and Verified With: Coralie Common RN [phone] K FORSYTH CORRECTED RESULTS NO ORGANISMS SEEN PREVIOUSLY REPORTED AS: GRAM POSITIVE COCCI IN CLUSTERS CORRECTED RESULTS CALLED TO: PHARMD S.HURTH AT 0916 ON 10/25/2021 BY T.SAAD. Performed at [facility], [place] [address], [place], [place] [address]    Culture   Final    NO GROWTH 4 DAYS Performed at [facility], [address] [place], [place] [address]    Report Status 10/27/2021 FINAL  Final  Blood Culture ID Panel (Reflexed)     Status: None   Collection Time: 10/23/21 12:52 PM  Result Value Ref Range Status   Enterococcus faecalis NOT DETECTED NOT DETECTED Final   Enterococcus Faecium NOT DETECTED NOT DETECTED Final   Listeria monocytogenes NOT DETECTED NOT DETECTED Final   Staphylococcus species NOT DETECTED NOT DETECTED Final   Staphylococcus aureus (BCID) NOT DETECTED NOT DETECTED Final   Staphylococcus epidermidis NOT DETECTED NOT DETECTED Final   Staphylococcus lugdunensis NOT DETECTED NOT DETECTED Final   Streptococcus species NOT DETECTED NOT DETECTED Final   Streptococcus agalactiae NOT DETECTED NOT DETECTED Final   Streptococcus pneumoniae NOT DETECTED NOT DETECTED Final   Streptococcus pyogenes NOT DETECTED NOT DETECTED Final   A.calcoaceticus-baumannii NOT DETECTED NOT DETECTED Final   Bacteroides fragilis NOT DETECTED NOT DETECTED Final   Enterobacterales NOT DETECTED NOT DETECTED Final   Enterobacter cloacae complex NOT DETECTED NOT DETECTED Final   Escherichia coli NOT DETECTED NOT DETECTED Final   Klebsiella aerogenes NOT DETECTED NOT DETECTED Final   Klebsiella  oxytoca NOT DETECTED NOT DETECTED Final   Klebsiella pneumoniae NOT DETECTED NOT DETECTED Final   Proteus species NOT DETECTED NOT DETECTED Final   Salmonella species NOT DETECTED NOT DETECTED Final   Serratia marcescens NOT DETECTED NOT DETECTED Final   Haemophilus influenzae NOT DETECTED NOT DETECTED Final   Neisseria meningitidis NOT DETECTED NOT DETECTED Final   Pseudomonas aeruginosa NOT DETECTED NOT DETECTED Final   Stenotrophomonas maltophilia NOT DETECTED NOT DETECTED Final   Candida albicans NOT DETECTED NOT DETECTED Final   Candida auris NOT DETECTED NOT DETECTED Final   Candida glabrata NOT DETECTED NOT DETECTED Final   Candida krusei NOT DETECTED NOT DETECTED Final   Candida parapsilosis NOT DETECTED  NOT DETECTED Final   Candida tropicalis NOT DETECTED NOT DETECTED Final   Cryptococcus neoformans/gattii NOT DETECTED NOT DETECTED Final    Comment: Performed at [facility], [address], [place], [place] [address]  Culture, blood (single)     Status: None   Collection Time: 10/23/21 12:53 PM   Specimen: BLOOD LEFT HAND  Result Value Ref Range Status   Specimen Description BLOOD LEFT HAND  Final   Special Requests   Final    BOTTLES DRAWN AEROBIC AND ANAEROBIC Blood Culture results may not be optimal due to an inadequate volume of blood received in culture bottles   Culture   Final    NO GROWTH 5 DAYS Performed at [facility], [address] [place], [place] [address]    Report Status 10/28/2021 FINAL  Final  Acid Fast Smear (AFB)     Status: None   Collection Time: 10/23/21  2:05 PM   Specimen: Peritoneal Cavity; Body Fluid  Result Value Ref Range Status   AFB Specimen Processing Concentration  Final   Acid Fast Smear Negative  Final    Comment: (NOTE) Performed At: [facility] [place], [place] [id number] Rush Farmer MD LT:[id number]    Source (AFB) PERITONEAL  Final    Comment: Performed at [facility], [address]  [place], [place] [address]  Culture, body fluid w Gram Stain-bottle     Status: None   Collection Time: 10/23/21  2:05 PM   Specimen: Peritoneal Washings  Result Value Ref Range Status   Specimen Description PERITONEAL  Final   Special Requests   Final    BOTTLES DRAWN AEROBIC AND ANAEROBIC Blood Culture adequate volume   Culture   Final    NO GROWTH 5 DAYS Performed at [facility], [address], [place], [place] [address]    Report Status 10/28/2021 FINAL  Final  Gram stain     Status: None   Collection Time: 10/23/21  2:05 PM   Specimen: Peritoneal Washings  Result Value Ref Range Status   Specimen Description PERITONEAL  Final   Special Requests NONE  Final   Gram Stain   Final    GRAM POSITIVE COCCI IN CLUSTERS CYTOSPIN SMEAR Gram Stain Report Called to,Read Back By and Verified With: Coralie Common RN [phone] K FORSYTH Performed at [facility], [address] [place], [place] [address]    Report Status 10/23/2021 FINAL  Final  Resp Panel by RT-PCR (Flu A&B, Covid) Nasopharyngeal Swab     Status: None   Collection Time: 10/23/21  2:10 PM   Specimen: Nasopharyngeal Swab; Nasopharyngeal(NP) swabs in vial transport medium  Result Value Ref Range Status   SARS Coronavirus 2 by RT PCR NEGATIVE NEGATIVE Final    Comment: (NOTE) SARS-CoV-2 target nucleic acids are NOT DETECTED.  The SARS-CoV-2 RNA is generally detectable in upper respiratory specimens during the acute phase of infection. The lowest concentration of SARS-CoV-2 viral copies this assay can detect is 138 copies/mL. A negative result does not preclude SARS-Cov-2 infection and should not be used as the sole basis for treatment or other patient management decisions. A negative result may occur with  improper specimen collection/handling, submission of specimen other than nasopharyngeal swab, presence of viral mutation(s) within the areas targeted by this assay, and inadequate number of viral copies(<138 copies/mL). A  negative result must be combined with clinical observations, patient history, and epidemiological information. The expected result is Negative.  Fact Sheet for Patients:  EntrepreneurPulse.com.au  Fact Sheet for Healthcare Providers:  IncredibleEmployment.be  This test is no t yet approved or cleared by the [place] FDA and  has been authorized for detection and/or diagnosis of SARS-CoV-2 by FDA under an Emergency Use Authorization (EUA). This EUA will remain  in effect (meaning this test can be used) for the duration of the COVID-19 declaration under Section 564(b)(1) of the Act, 21 U.S.C.section 360bbb-3(b)(1), unless the authorization is terminated  or revoked sooner.       Influenza A by PCR NEGATIVE NEGATIVE Final   Influenza B by PCR NEGATIVE NEGATIVE Final    Comment: (NOTE) The Xpert Xpress SARS-CoV-2/FLU/RSV plus assay is intended as an aid in the diagnosis of influenza from Nasopharyngeal swab specimens and should not be used as a sole basis for treatment. Nasal washings and aspirates are unacceptable for Xpert Xpress SARS-CoV-2/FLU/RSV testing.  Fact Sheet for Patients: EntrepreneurPulse.com.au  Fact Sheet for Healthcare Providers: IncredibleEmployment.be  This test is not yet approved or cleared by the [place] FDA and has been authorized for detection and/or diagnosis of SARS-CoV-2 by FDA under an Emergency Use Authorization (EUA). This EUA will remain in effect (meaning this test can be used) for the duration of the COVID-19 declaration under Section 564(b)(1) of the Act, 21 U.S.C. section 360bbb-3(b)(1), unless the authorization is terminated or revoked.  Performed at [facility], [address] [place], [place] [address]   MRSA Next Gen by PCR, Nasal     Status: None   Collection Time: 10/23/21  6:17 PM   Specimen: Nasal Mucosa; Nasal Swab  Result Value Ref Range Status    MRSA by PCR Next Gen NOT DETECTED NOT DETECTED Final    Comment: (NOTE) The GeneXpert MRSA Assay (FDA approved for NASAL specimens only), is one component of a comprehensive MRSA colonization surveillance program. It is not intended to diagnose MRSA infection nor to guide or monitor treatment for MRSA infections. Test performance is not FDA approved in patients less than 36 years old. Performed at [facility], [address], [place], [place] [address]   Gram stain     Status: None   Collection Time: 10/27/21 12:42 PM   Specimen: Peritoneal Washings  Result Value Ref Range Status   Specimen Description PERITONEAL  Final   Special Requests NONE  Final   Gram Stain   Final    WBC PRESENT,BOTH PMN AND MONONUCLEAR CYTOSPIN SMEAR NO ORGANISMS SEEN Performed at [facility], [address], [place], [place] [address]    Report Status 10/27/2021 FINAL  Final  Culture, body fluid w Gram Stain-bottle     Status: None (Preliminary result)   Collection Time: 10/27/21 12:42 PM   Specimen: Peritoneal Washings  Result Value Ref Range Status   Specimen Description PERITONEAL BOTTLES DRAWN AEROBIC AND ANAEROBIC  Final   Special Requests 10CC  Final   Culture   Final    NO GROWTH 2 DAYS Performed at [facility], [address] [place], [place] [address]    Report Status PENDING  Incomplete    Radiology Studies: US Paracentesis  Result Date: 10/27/2021 Lavonia Dana, MD     10/27/2021  1:41 PM PreOperative Dx: Cirrhosis, ascites Postoperative Dx: Cirrhosis, ascites Procedure:   US guided paracentesis Radiologist:  Thornton Papas Anesthesia:  10 ml of1% lidocaine Specimen:  3.8 L of yellow ascitic fluid EBL:   < 1 ml Complications: None     Scheduled Meds:  sodium chloride   Intravenous Once   bumetanide  1 mg Oral Daily   ciprofloxacin  500 mg Oral Q breakfast   feeding supplement  237 mL Oral BID BM   ferrous sulfate  325 mg Oral Q breakfast   lactulose  10 g Oral BID   pantoprazole  40 mg Oral  QAC breakfast   sodium chloride flush  3 mL Intravenous Q12H   spironolactone  50 mg Oral Daily   Continuous Infusions:  sodium chloride 250 mL (10/23/21 2057)   sodium chloride 10 mL (10/24/21 0103)    LOS: 6 days   Time spent: 35 mins  Kelena Garrow Wynetta Emery, MD  How to contact the [facility] Attending or Consulting provider [place] or covering provider during after hours [place], for this patient?  Check the care team in [facility] and look for a) attending/consulting [facility] provider listed and b) the [facility] team listed Log into [email or web address] and use [place] universal password to access. If you do not have the password, please contact the hospital operator. Locate the [facility] provider you are looking for under [facility] and page to a number that you can be directly reached. If you still have difficulty reaching the provider, please page the [facility] (Director on Call) for the Hospitalists listed on amion for assistance.'   10/29/2021, 10:19 AM

## 2021-10-30 ENCOUNTER — Inpatient Hospital Stay (HOSPITAL_COMMUNITY): Payer: Managed Care, Other (non HMO)

## 2021-10-30 LAB — BASIC METABOLIC PANEL
Anion gap: 12 (ref 5–15)
BUN: 28 mg/dL — ABNORMAL HIGH (ref 6–20)
CO2: 23 mmol/L (ref 22–32)
Calcium: 10.1 mg/dL (ref 8.9–10.3)
Chloride: 96 mmol/L — ABNORMAL LOW (ref 98–111)
Creatinine, Ser: 0.62 mg/dL (ref 0.61–1.24)
GFR, Estimated: >60 mL/min (ref >60)
Glucose, Bld: 92 mg/dL (ref 70–99)
Potassium: 4.4 mmol/L (ref 3.5–5.1)
Sodium: 131 mmol/L — ABNORMAL LOW (ref 135–145)

## 2021-10-30 LAB — RESP PANEL BY RT-PCR (FLU A&B, COVID) ARPGX2
Influenza A by PCR: NEGATIVE
Influenza B by PCR: NEGATIVE
SARS Coronavirus 2 by RT PCR: NEGATIVE

## 2021-10-30 LAB — CYTOLOGY - NON PAP

## 2021-10-30 MED ORDER — CIPROFLOXACIN HCL 500 MG PO TABS
500.0000 mg | ORAL_TABLET | Freq: Every day | ORAL | Status: DC
Start: 2021-10-31 — End: 2021-11-01

## 2021-10-30 MED ORDER — SPIRONOLACTONE 50 MG PO TABS: 50.0000 mg | ORAL_TABLET | Freq: Every day | ORAL | Status: DC

## 2021-10-30 MED ORDER — ENSURE ENLIVE PO LIQD: 237.0000 mL | Freq: Two times a day (BID) | ORAL | 12 refills | Status: DC

## 2021-10-30 MED ORDER — PANTOPRAZOLE SODIUM 40 MG PO TBEC: 40.0000 mg | DELAYED_RELEASE_TABLET | Freq: Every day | ORAL | Status: DC

## 2021-10-30 MED ORDER — BUMETANIDE 1 MG PO TABS
1.0000 mg | ORAL_TABLET | Freq: Every day | ORAL | Status: DC
Start: 2021-10-30 — End: 2021-11-01

## 2021-10-30 MED ORDER — HYDROMORPHONE HCL 1 MG/ML IJ SOLN
0.5000 mg | INTRAMUSCULAR | Status: DC | PRN
Start: 2021-10-30 — End: 2021-10-31
  Administered 2021-10-30 – 2021-10-31 (×2): 0.5 mg via INTRAVENOUS
  Filled 2021-10-30 (×2): qty 0.5

## 2021-10-30 MED ORDER — LACTULOSE 10 GM/15ML PO SOLN: 10.0000 g | Freq: Two times a day (BID) | ORAL | 0 refills | Status: DC

## 2021-10-30 NOTE — Progress Notes (Signed)
Palliative: Lawrence Rogers, Lawrence Rogers, is sitting in the [place] chair.  He appears chronically ill and quite frail.  He greets me, making and mostly keeping eye contact.  He is alert, able to make his needs known.  His aunt is at bedside.  Along with nursing staff attending to his needs.  Over the weekend, Lawrence Rogers has transition to comfort care, requesting residential hospice at [place].  We talked about symptom management, orders adjusted.  Conference with attending, bedside nursing staff, transition of care team related to patient condition, needs, goals of care, disposition. [place] form completed.  Plan: Comfort and dignity at end-of-life, residential hospice at [place]. Prognosis: 2 to 3 weeks or less as expected based on chronic illness burden, advancing cancer, cirrhosis.  74 minutes Lawrence Axe, NP Palliative medicine team Team phone [phone] Greater than 50% of this time was spent counseling and coordinating care related to the above assessment and plan.

## 2021-10-30 NOTE — Progress Notes (Signed)
PROGRESS NOTE   Lawrence Rogers  TGG:[id number] DOB: 31-Jan-1961 DOA: 10/23/2021 PCP: [name], MD   No chief complaint on file.   Level of care: Med-Surg  Brief Admission History:  Lawrence Rogers is a 60 y.o. 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  At presentation to ED, patient reported weakness, denies syncope, loss of consciousness or dizziness. CT abdomen and pelvis showed no posttraumatic deformities. Patient was fluid overloaded with ascites.  Labs at that time showed a leukocytosis 137.1, concerning for malignancy.  Patient MRI was concerning for hepatocellular carcinoma.  Both GI and oncology has been consulted.  Patient is status post large-volume paracentesis of 3.6 L on 11/8. Fluid analysis consistent with SBP, gram stain with gram-positive cocci in clusters; Continue IV antibiotics.  Imaging for falls showed posttraumatic injuries.  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  Subsequent MRI showed 9.5 cm infiltrating enhancing mass.  Palliative care has seen patient; patient now DNR.  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  Assessment & Plan:   Active Problems:   Cirrhosis of liver with ascites (HCC)   Cirrhosis of liver (HCC)   Ascites   SBP (spontaneous bacterial peritonitis) (HCC)   Liver mass   Hyperammonemia (HCC)   Peripheral edema   Protein-calorie malnutrition, severe   Leukemoid reaction   Hepatocellular carcinoma (HCC)   Decompensated cirrhosis with SBP -Significant volume overload with noted noncompliance with diuretics at presentation. -S/p paracentesis on 11/7, per GI performed with over 3 L removed. -fluid analysis consistent with SBP (355 PMNs); gram stain with gram-positive cocci in clusters. -Cefepime, vancomycin discontinued -Zosyn was added to  patient's regimen.  On 11/13 started daily prophylaxis ciprofloxacin 500 mg daily.   - IV albumin given by GI service thru 11/13.  -GI team resuming diuretics: Bumex 1 mg started 11/11 -AKI improved; continue spironolactone.   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  Subsequent MRI showed 9.5 cm infiltrating enhancing mass.  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 If that improves to A/B he may be able to have chemo therapy.   -Per 11/9 oncology note, patient likely available for transplant: Patient will be offered systemic therapy [facility] -Repeat Paracentesis 11/11 with Dr. Thornton Papas for therapeutic purposes. Pt tolerated with good results.     Hepatocellular Carcinoma - Pt decided to pursue residential hospice placement.    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  - difficult choices ahead - if we can't secure 24/7 supervision/care for him at home, he will need other arrangements; best scenario would be for him to go to residential hospice.  Awaiting for [facility] hosp[facility] to come and screen him. -11/13: pt is agreeable to going to residential hospice.    Transaminitis secondary to above -MRI concerning for hepatocellular carcinoma -Labs stable/slightly elevated AST 65>>76>>74, alk phos 386>>418>>437, total bilirubin 5.4>>6.2>>7.5 -no further lab draws as plan is to get him to residential hospice   Progressive weakness/debility with fall-multifactorial -No acute injuries noted -residential hospice placement in progress -Fall precautions  Leukocytosis - Leukemoid reaction  -Oncology has been consulted; believe leukocytosis is secondary to SBP. -WBC increasing; 86.6 >> 101.0 -Continue  antibiotics as stated above -repeat CXR with no signs of infection found. -stop further blood draws and focus on residential hospice placement.  Vitals once daily only for comfort.    Lactic acidosis - resolved now -Likely in the setting of hematological malignancy and not likely sepsis physiology   Hyponatremia/AKI -Likely in the setting of prerenal causes with suspicion of hepatorenal syndrome -IV albumin as ordered per GI to complete 11/13.  -Continue to monitor strict I's and O's along with daily weights -Avoid nephrotoxic agents -Spironolactone as stated above -Bumex 1 mg daily started by GI team.  -Secondary to volume overload.    DVT prophylaxis: SCD Code Status: DNR  Family Communication: Patient stepdaughter telephone update 11/12 Disposition: residential hospice  Status is: Inpatient  Pt is medically stable to discharge to residential hospice, currently awaiting hospice bed availability    Consultants:  GI and oncology  Procedures:  Large-volume paracentesis  Antimicrobials:  Cefepime Vancomycin  Subjective: Patient having some malaise and "cabin fever" wanting to get out of hospital.  He agrees to residential hospice.    Objective: Vitals:   10/29/21 1459 10/29/21 1634 10/29/21 2100 10/30/21 0520  BP:   (!) 108/47 93/65  Pulse: (!) 107 (!) 105 (!) 109 (!) 101  Resp:   20 18  Temp:   98.8 F (37.1 C) 97.6 F (36.4 C)  TempSrc:   Oral Oral  SpO2: 94% 94% 93% 95%  Weight:    99.7 kg  Height:        Intake/Output Summary (Last 24 hours) at 10/30/2021 1115 Last data filed at 10/30/2021 0900 Gross per 24 hour  Intake 720 ml  Output 1500 ml  Net -780 ml   Filed Weights   10/28/21 0616 10/29/21 0500 10/30/21 0520  Weight: 101.5 kg 100.4 kg 99.7 kg    Examination:  General exam: Appears calm and comfortable.  Appears very ill, appears jaundiced, appears older than stated age.  Respiratory system: Clear to auscultation. Respiratory effort  normal. Cardiovascular system: normal S1 & S2 heard. No JVD, murmurs, rubs, gallops or clicks.  +3 edema bilateral feet and legs  gastrointestinal system: Abdomen extended, nontender Central nervous system: Alert and oriented. No focal neurological deficits. Skin: Jaundice  psychiatry: Judgement and insight appear normal. Mood & affect appropriate.   Data Reviewed: I have personally reviewed following labs and imaging studies  CBC: Recent Labs  Lab 10/24/21 0517 10/25/21 0506 10/26/21 0521 10/27/21 0508 10/28/21 0500  WBC 100.3* 86.6* 101.8* 103.9*  --   NEUTROABS  --  78.8* 94.7* 91.4*  --   HGB 9.4* 9.2* 9.9* 9.7* 9.6*  HCT 28.9* 27.0* 30.6* 29.2* 28.5*  MCV 89.8 87.1 89.7 89.6  --   PLT 76* 76* 78* 87*  --     Basic Metabolic Panel: Recent Labs  Lab 10/24/21 0517 10/25/21 0506 10/26/21 0521 10/27/21 0508 10/28/21 0500 10/29/21 0413 10/30/21 0925  NA 129* 131* 129* 129* 131* 130* 131*  K 3.9 3.3* 3.9 3.8 3.4* 3.5 4.4  CL 96* 95* 98 98 98 96* 96*  CO2 26 23 20* 21* 22 22 23   GLUCOSE 47* 50* 75 101* 79 41* 92  BUN 37* 32* 26* 26* 26* 24* 28*  CREATININE 1.18 0.94 0.75 0.79 0.79 0.70 0.62  CALCIUM 8.6* 9.2 9.3 9.5 9.8 9.8 10.1  MG 2.2 2.4  --   --   --   --   --  GFR: Estimated Creatinine Clearance: 125.4 mL/min (by C-G formula based on SCr of 0.62 mg/dL).  Liver Function Tests: Recent Labs  Lab 10/25/21 0506 10/26/21 0521 10/27/21 0508 10/28/21 0500 10/29/21 0413  AST 76* 74* 55* 58* 65*  ALT 33 33 34 30 34  ALKPHOS 418* 437* 427* 444* 485*  BILITOT 6.2* 7.5* 7.1* 7.2* 7.6*  PROT 5.2* 4.9* 4.8* 4.9* 5.1*  ALBUMIN 2.9* 2.6* 2.6* 2.7* 2.6*    CBG: Recent Labs  Lab 10/27/21 0721 10/27/21 1115 10/27/21 1612 10/27/21 2051 10/29/21 1040  GLUCAP 96 225* 156* 170* 232*    Recent Results (from the past 240 hour(s))  Culture, blood (single)     Status: None   Collection Time: 10/23/21 12:52 PM   Specimen: Right Antecubital; Blood  Result Value  Ref Range Status   Specimen Description   Final    RIGHT ANTECUBITAL Performed at [facility], [address] [place], [place] [address]    Special Requests   Final    BOTTLES DRAWN AEROBIC AND ANAEROBIC Blood Culture adequate volume Performed at [facility], [address], [place], [place] [address]    Culture  Setup Time   Final    AEB AND ANA BOTTLES Gram Stain Report Called to,Read Back By and Verified With: Coralie Common RN [phone] K FORSYTH CORRECTED RESULTS NO ORGANISMS SEEN PREVIOUSLY REPORTED AS: GRAM POSITIVE COCCI IN CLUSTERS CORRECTED RESULTS CALLED TO: PHARMD S.HURTH AT 0916 ON 10/25/2021 BY T.SAAD. Performed at [facility], [place] [address], [place], [place] [address]    Culture   Final    NO GROWTH 4 DAYS Performed at [facility], [address], [place], [place] [address]    Report Status 10/27/2021 FINAL  Final  Blood Culture ID Panel (Reflexed)     Status: None   Collection Time: 10/23/21 12:52 PM  Result Value Ref Range Status   Enterococcus faecalis NOT DETECTED NOT DETECTED Final   Enterococcus Faecium NOT DETECTED NOT DETECTED Final   Listeria monocytogenes NOT DETECTED NOT DETECTED Final   Staphylococcus species NOT DETECTED NOT DETECTED Final   Staphylococcus aureus (BCID) NOT DETECTED NOT DETECTED Final   Staphylococcus epidermidis NOT DETECTED NOT DETECTED Final   Staphylococcus lugdunensis NOT DETECTED NOT DETECTED Final   Streptococcus species NOT DETECTED NOT DETECTED Final   Streptococcus agalactiae NOT DETECTED NOT DETECTED Final   Streptococcus pneumoniae NOT DETECTED NOT DETECTED Final   Streptococcus pyogenes NOT DETECTED NOT DETECTED Final   A.calcoaceticus-baumannii NOT DETECTED NOT DETECTED Final   Bacteroides fragilis NOT DETECTED NOT DETECTED Final   Enterobacterales NOT DETECTED NOT DETECTED Final   Enterobacter cloacae complex NOT DETECTED NOT DETECTED Final   Escherichia coli NOT DETECTED NOT DETECTED Final   Klebsiella aerogenes  NOT DETECTED NOT DETECTED Final   Klebsiella oxytoca NOT DETECTED NOT DETECTED Final   Klebsiella pneumoniae NOT DETECTED NOT DETECTED Final   Proteus species NOT DETECTED NOT DETECTED Final   Salmonella species NOT DETECTED NOT DETECTED Final   Serratia marcescens NOT DETECTED NOT DETECTED Final   Haemophilus influenzae NOT DETECTED NOT DETECTED Final   Neisseria meningitidis NOT DETECTED NOT DETECTED Final   Pseudomonas aeruginosa NOT DETECTED NOT DETECTED Final   Stenotrophomonas maltophilia NOT DETECTED NOT DETECTED Final   Candida albicans NOT DETECTED NOT DETECTED Final   Candida auris NOT DETECTED NOT DETECTED Final   Candida glabrata NOT DETECTED NOT DETECTED Final   Candida krusei NOT DETECTED NOT DETECTED Final   Candida parapsilosis  NOT DETECTED NOT DETECTED Final   Candida tropicalis NOT DETECTED NOT DETECTED Final   Cryptococcus neoformans/gattii NOT DETECTED NOT DETECTED Final    Comment: Performed at [facility], [place] [address], [place], [place] [address]  Culture, blood (single)     Status: None   Collection Time: 10/23/21 12:53 PM   Specimen: BLOOD LEFT HAND  Result Value Ref Range Status   Specimen Description BLOOD LEFT HAND  Final   Special Requests   Final    BOTTLES DRAWN AEROBIC AND ANAEROBIC Blood Culture results may not be optimal due to an inadequate volume of blood received in culture bottles   Culture   Final    NO GROWTH 5 DAYS Performed at [facility], [address], [place], [place] [address]    Report Status 10/28/2021 FINAL  Final  Acid Fast Smear (AFB)     Status: None   Collection Time: 10/23/21  2:05 PM   Specimen: Peritoneal Cavity; Body Fluid  Result Value Ref Range Status   AFB Specimen Processing Concentration  Final   Acid Fast Smear Negative  Final    Comment: (NOTE) Performed At: [facility], [place] [id number] Rush Farmer MD TG:[id number]    Source (AFB) PERITONEAL  Final    Comment:  Performed at [facility], [address] [place], [place] [address]  Culture, body fluid w Gram Stain-bottle     Status: None   Collection Time: 10/23/21  2:05 PM   Specimen: Peritoneal Washings  Result Value Ref Range Status   Specimen Description PERITONEAL  Final   Special Requests   Final    BOTTLES DRAWN AEROBIC AND ANAEROBIC Blood Culture adequate volume   Culture   Final    NO GROWTH 5 DAYS Performed at [facility], [address] [place], [place] [address]    Report Status 10/28/2021 FINAL  Final  Gram stain     Status: None   Collection Time: 10/23/21  2:05 PM   Specimen: Peritoneal Washings  Result Value Ref Range Status   Specimen Description PERITONEAL  Final   Special Requests NONE  Final   Gram Stain   Final    GRAM POSITIVE COCCI IN CLUSTERS CYTOSPIN SMEAR Gram Stain Report Called to,Read Back By and Verified With: Coralie Common RN [phone] K FORSYTH Performed at [facility], [address] [place], [place] [address]    Report Status 10/23/2021 FINAL  Final  Resp Panel by RT-PCR (Flu A&B, Covid) Nasopharyngeal Swab     Status: None   Collection Time: 10/23/21  2:10 PM   Specimen: Nasopharyngeal Swab; Nasopharyngeal(NP) swabs in vial transport medium  Result Value Ref Range Status   SARS Coronavirus 2 by RT PCR NEGATIVE NEGATIVE Final    Comment: (NOTE) SARS-CoV-2 target nucleic acids are NOT DETECTED.  The SARS-CoV-2 RNA is generally detectable in upper respiratory specimens during the acute phase of infection. The lowest concentration of SARS-CoV-2 viral copies this assay can detect is 138 copies/mL. A negative result does not preclude SARS-Cov-2 infection and should not be used as the sole basis for treatment or other patient management decisions. A negative result may occur with  improper specimen collection/handling, submission of specimen other than nasopharyngeal swab, presence of viral mutation(s) within the areas targeted by this assay, and inadequate  number of viral copies(<138 copies/mL). A negative result must be combined with clinical observations, patient history, and epidemiological information. The expected result is Negative.  Fact Sheet  for Patients:  EntrepreneurPulse.com.au  Fact Sheet for Healthcare Providers:  IncredibleEmployment.be  This test is no t yet approved or cleared by the [place] FDA and  has been authorized for detection and/or diagnosis of SARS-CoV-2 by FDA under an Emergency Use Authorization (EUA). This EUA will remain  in effect (meaning this test can be used) for the duration of the COVID-19 declaration under Section 564(b)(1) of the Act, 21 U.S.C.section 360bbb-3(b)(1), unless the authorization is terminated  or revoked sooner.       Influenza A by PCR NEGATIVE NEGATIVE Final   Influenza B by PCR NEGATIVE NEGATIVE Final    Comment: (NOTE) The Xpert Xpress SARS-CoV-2/FLU/RSV plus assay is intended as an aid in the diagnosis of influenza from Nasopharyngeal swab specimens and should not be used as a sole basis for treatment. Nasal washings and aspirates are unacceptable for Xpert Xpress SARS-CoV-2/FLU/RSV testing.  Fact Sheet for Patients: EntrepreneurPulse.com.au  Fact Sheet for Healthcare Providers: IncredibleEmployment.be  This test is not yet approved or cleared by the [place] FDA and has been authorized for detection and/or diagnosis of SARS-CoV-2 by FDA under an Emergency Use Authorization (EUA). This EUA will remain in effect (meaning this test can be used) for the duration of the COVID-19 declaration under Section 564(b)(1) of the Act, 21 U.S.C. section 360bbb-3(b)(1), unless the authorization is terminated or revoked.  Performed at [facility], [address], [place], [place] [address]   MRSA Next Gen by PCR, Nasal     Status: None   Collection Time: 10/23/21  6:17 PM   Specimen: Nasal Mucosa;  Nasal Swab  Result Value Ref Range Status   MRSA by PCR Next Gen NOT DETECTED NOT DETECTED Final    Comment: (NOTE) The GeneXpert MRSA Assay (FDA approved for NASAL specimens only), is one component of a comprehensive MRSA colonization surveillance program. It is not intended to diagnose MRSA infection nor to guide or monitor treatment for MRSA infections. Test performance is not FDA approved in patients less than 39 years old. Performed at [facility], [address], [place], [place] [address]   Gram stain     Status: None   Collection Time: 10/27/21 12:42 PM   Specimen: Peritoneal Washings  Result Value Ref Range Status   Specimen Description PERITONEAL  Final   Special Requests NONE  Final   Gram Stain   Final    WBC PRESENT,BOTH PMN AND MONONUCLEAR CYTOSPIN SMEAR NO ORGANISMS SEEN Performed at [facility], [address], [place], [place] [address]    Report Status 10/27/2021 FINAL  Final  Culture, body fluid w Gram Stain-bottle     Status: None (Preliminary result)   Collection Time: 10/27/21 12:42 PM   Specimen: Peritoneal Washings  Result Value Ref Range Status   Specimen Description PERITONEAL BOTTLES DRAWN AEROBIC AND ANAEROBIC  Final   Special Requests 10CC  Final   Culture   Final    NO GROWTH 3 DAYS Performed at [facility], [address], [place], [place] [address]    Report Status PENDING  Incomplete    Radiology Studies: [place] ASCITES (ABDOMEN LIMITED)  Result Date: 10/30/2021 CLINICAL DATA:  60 year old male with history of ascites. EXAM: LIMITED ABDOMEN ULTRASOUND FOR ASCITES TECHNIQUE: Limited ultrasound survey for ascites was performed in all four abdominal quadrants. COMPARISON:  Multiple priors, most recently 10/27/2021. FINDINGS: Small volume of ascites noted predominantly in the right-side of the abdomen, much less than prior examinations and insufficient to safely perform paracentesis for therapeutic  purposes. IMPRESSION: 1. Small volume of ascites.  Electronically Signed   By: Vinnie Langton M.D.   On: 10/30/2021 10:24    Scheduled Meds:  sodium chloride   Intravenous Once   bumetanide  1 mg Oral Daily   ciprofloxacin  500 mg Oral Q breakfast   feeding supplement  237 mL Oral BID BM   ferrous sulfate  325 mg Oral Q breakfast   lactulose  10 g Oral BID   pantoprazole  40 mg Oral QAC breakfast   sodium chloride flush  3 mL Intravenous Q12H   spironolactone  50 mg Oral Daily   Continuous Infusions:  sodium chloride 250 mL (10/23/21 2057)   sodium chloride 10 mL (10/24/21 0103)    LOS: 7 days   Time spent: 35 mins  Shantaya Bluestone Wynetta Emery, MD  How to contact the [facility] Attending or Consulting provider [place] or covering provider during after hours [place], for this patient?  Check the care team in [facility] and look for a) attending/consulting [facility] provider listed and b) the [facility] team listed Log into [email or web address] and use [place] universal password to access. If you do not have the password, please contact the hospital operator. Locate the [facility] provider you are looking for under [facility] and page to a number that you can be directly reached. If you still have difficulty reaching the provider, please page the [facility] (Director on Call) for the Hospitalists listed on amion for assistance.'   10/30/2021, 11:15 AM

## 2021-10-30 NOTE — Progress Notes (Addendum)
Subjective: No abdominal pain but does feel distended. No N/V. Fair appetite. No overt GI bleeding. States he feels fuzzy-headed at times. Alert and oriented X 4 today. Aware of situation. Knows hospice is in the works.   Objective: Vital signs in last 24 hours: Temp:  [97.6 F (36.4 C)-98.8 F (37.1 C)] 97.6 F (36.4 C) (11/14 0520) Pulse Rate:  [101-109] 101 (11/14 0520) Resp:  [18-20] 18 (11/14 0520) BP: (93-108)/(47-68) 93/65 (11/14 0520) SpO2:  [93 %-97 %] 95 % (11/14 0520) Weight:  [99.7 kg] 99.7 kg (11/14 0520) Last BM Date: 10/23/21 General:   Alert and oriented, chronically ill-appearing, weak.  Head:  Normocephalic and atraumatic. Eyes:  +scleral icterus Mouth:  Without lesions, mucosa pink and moist.  Abdomen:  Bowel sounds present, distended with ascites but soft, no rebound or guarding. Umbilical hernia reducible.  Extremities:  marked lower extremity edema to thigh, 3+ pitting Neurologic:  Alert and  oriented x4  Intake/Output from previous day: 11/13 0701 - 11/14 0700 In: 720 [P.O.:720] Out: 1500 [Urine:1500] Intake/Output this shift: No intake/output data recorded.  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 Leukemoid reaction in setting of SBP and also found to have [place].  Newly decompensated  NASH cirrhosis with SBP and anasarca: received weight-based dosing IV albumin at onset of diagnosis of SBP and has completed albumin rounds. Gram stain with gram-positive cocci but body fluid cultures remain negative without growth. Completed course of IV antibiotics and now on once daily Cipro. Continues to have marked lower extremity edema multifactorial . Currently on 1 mg Bumex daily and aldactone 50 mg daily for diuretic therapy. Repeat BMP today. 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 Holding off on early interval EGD due to plans for hospice.   HCC: AFP 64.3. Oncology on board. Not a candidate for transplant or directed intervention. Not a candidate for systemic therapy in setting of Child Pugh Class C. Recommending hospice at this time.   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   Difficult scenario with poor prognosis. Appreciate palliative involvement. No further interventions available from a GI standpoint. Will order para today for therapeutic purposes only.   Plan: Repeat BMP today to assess renal function in setting of diuretics Therapeutic para today Continue Cipro daily Holding off on EGD as outpatient due to likely discharge with hospice Can continue lactulose dosing Supportive measures GI will sign off. Please call if any further concerns.    Annitta Needs, PhD, ANP-BC [facility]    LOS: 7 days    10/30/2021, 8:05 AM   Addendum: holding off on  labs due to hospice referral.

## 2021-10-30 NOTE — Discharge Summary (Signed)
Physician Discharge Summary  Lawrence Rogers WRU:[id number] DOB: Apr 23, 1961 DOA: 10/23/2021  PCP: [name], MD  Admit date: 10/23/2021 Discharge date: 10/30/2021  Disposition:  RESIDENTIAL HOSPICE   Recommendations for Outpatient Follow-up:  SYMPTOM MANAGEMENT PER HOSPICE PROTOCOLS   Discharge Condition: HOSPICE   CODE STATUS: DNR  DIET: comfort    Brief Hospitalization Summary: Please see all hospital notes, images, labs for full details of the hospitalization. Brief Admission History:  Lawrence Rogers is a 60 y.o. 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  At presentation to ED, patient reported weakness, denies syncope, loss of consciousness or dizziness. CT abdomen and pelvis showed no posttraumatic deformities. Patient was fluid overloaded with ascites.  Labs at that time showed a leukocytosis 137.1, concerning for malignancy.  Patient MRI was concerning for hepatocellular carcinoma.  Both GI and oncology has been consulted.  Patient is status post large-volume paracentesis of 3.6 L on 11/8. Fluid analysis consistent with SBP, gram stain with gram-positive cocci in clusters; Continue IV antibiotics.  Imaging for falls showed posttraumatic injuries.  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  Subsequent MRI showed 9.5 cm infiltrating enhancing mass.  Palliative care has seen patient; patient now DNR.  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   Assessment & Plan:   Active Problems:   Cirrhosis of liver with ascites (HCC)   Cirrhosis of liver (HCC)   Ascites   SBP (spontaneous bacterial peritonitis) (HCC)   Liver mass   Hyperammonemia (HCC)   Peripheral edema   Protein-calorie malnutrition, severe   Leukemoid reaction   Hepatocellular carcinoma (HCC)     Decompensated  cirrhosis with SBP -Significant volume overload with noted noncompliance with diuretics at presentation. -S/p paracentesis on 11/7, per GI performed with over 3 L removed. -fluid analysis consistent with SBP (355 PMNs); gram stain with gram-positive cocci in clusters. -Cefepime, vancomycin discontinued -Zosyn was added to patient's regimen.  On 11/13 started daily prophylaxis ciprofloxacin 500 mg daily.   - IV albumin given by GI service thru 11/13.  -GI team resuming diuretics: Bumex 1 mg started 11/11 -AKI improved; continue spironolactone.   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  Subsequent MRI showed 9.5 cm infiltrating enhancing mass.  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 If that improves to A/B he may be able to have chemo therapy.   -Per 11/9 oncology note, patient likely available for transplant: Patient will be offered systemic therapy [facility] -Repeat Paracentesis 11/11 with Dr. Thornton Papas for therapeutic purposes. Pt tolerated with good results.      Hepatocellular Carcinoma - Pt decided to pursue residential hospice placement.    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  - difficult choices ahead - if we can't secure 24/7 supervision/care for him at home, he will need other arrangements; best scenario would be for him to go to residential hospice.  Awaiting for [facility] hospice to come and screen him. -11/13: pt is agreeable to going to residential hospice.     Transaminitis secondary to above -MRI  concerning for hepatocellular carcinoma -Labs stable/slightly elevated AST 65>>76>>74, alk phos 386>>418>>437, total bilirubin 5.4>>6.2>>7.5 -no further lab draws as plan is to get  him to residential hospice   Progressive weakness/debility with fall-multifactorial -No acute injuries noted -residential hospice placement in progress -Fall precautions   Leukocytosis - Leukemoid reaction  -Oncology has been consulted; believe leukocytosis is secondary to SBP. -WBC increasing; 86.6 >> 101.0 -Continue antibiotics as stated above -repeat CXR with no signs of infection found. -stop further blood draws and focus on residential hospice placement.  Vitals once daily only for comfort.    Lactic acidosis - resolved now -Likely in the setting of hematological malignancy and not likely sepsis physiology   Hyponatremia/AKI -Likely in the setting of prerenal causes with suspicion of hepatorenal syndrome -IV albumin as ordered per GI to complete 11/13.  -Avoid nephrotoxic agents -Spironolactone as stated above -Bumex 1 mg daily started by GI team.  -Secondary to volume overload.     DVT prophylaxis: SCD Code Status: DNR  Family Communication: Patient stepdaughter telephone update 11/12 Disposition: residential hospice  Status is: Inpatient   Pt is medically stable to discharge to residential hospice, currently awaiting hospice bed availability    Consultants:  GI and oncology Palliative care    Procedures:  Large-volume paracentesis   Antimicrobials:  Cefepime - completed  Vancomycin - completed    Discharge Diagnoses:  Active Problems:   Cirrhosis of liver with ascites (HCC)   Cirrhosis of liver (HCC)   Ascites   SBP (spontaneous bacterial peritonitis) (HCC)   Liver mass   Hyperammonemia (HCC)   Peripheral edema   Protein-calorie malnutrition, severe   Leukemoid reaction   Hepatocellular carcinoma [facility])   Discharge Instructions:  Allergies as of 10/30/2021   No Known Allergies      Medication List     STOP taking these medications    atorvastatin 20 MG tablet Commonly known as: LIPITOR   cholecalciferol 25 MCG (1000 UNIT) tablet Commonly  known as: VITAMIN D3   ferrous sulfate 325 (65 FE) MG tablet   glimepiride 2 MG tablet Commonly known as: AMARYL   metFORMIN 500 MG 24 hr tablet Commonly known as: GLUCOPHAGE-XR   methocarbamol 500 MG tablet Commonly known as: Robaxin   ondansetron 4 MG tablet Commonly known as: Zofran   oxyCODONE 5 MG immediate release tablet Commonly known as: Roxicodone   traMADol 50 MG tablet Commonly known as: ULTRAM       TAKE these medications    bumetanide 1 MG tablet Commonly known as: BUMEX Take 1 tablet (1 mg total) by mouth daily. What changed:  medication strength how much to take   ciprofloxacin 500 MG tablet Commonly known as: CIPRO Take 1 tablet (500 mg total) by mouth daily with breakfast. Start taking on: October 31, 2021   feeding supplement Liqd Take 237 mLs by mouth 2 (two) times daily between meals.   lactulose 10 GM/15ML solution Commonly known as: CHRONULAC Take 15 mLs (10 g total) by mouth 2 (two) times daily.   pantoprazole 40 MG tablet Commonly known as: PROTONIX Take 1 tablet (40 mg total) by mouth daily before breakfast.   spironolactone 50 MG tablet Commonly known as: ALDACTONE Take 1 tablet (50 mg total) by mouth daily. What changed:  medication strength how much to take        Follow-up Information     [facility] Follow up.   Specialty: Home Health Services Why: Home health Contact information: [address] [phone]                No Known Allergies Allergies as of 10/30/2021   No Known Allergies      Medication List     STOP taking these medications    atorvastatin 20 MG tablet Commonly known as: LIPITOR   cholecalciferol 25 MCG (1000 UNIT) tablet Commonly known as: VITAMIN D3   ferrous sulfate 325 (65 FE) MG tablet   glimepiride 2 MG tablet Commonly known as: AMARYL   metFORMIN 500 MG 24 hr tablet Commonly known as: GLUCOPHAGE-XR   methocarbamol  500 MG tablet Commonly known as: Robaxin   ondansetron 4 MG tablet Commonly known as: Zofran   oxyCODONE 5 MG immediate release tablet Commonly known as: Roxicodone   traMADol 50 MG tablet Commonly known as: ULTRAM       TAKE these medications    bumetanide 1 MG tablet Commonly known as: BUMEX Take 1 tablet (1 mg total) by mouth daily. What changed:  medication strength how much to take   ciprofloxacin 500 MG tablet Commonly known as: CIPRO Take 1 tablet (500 mg total) by mouth daily with breakfast. Start taking on: October 31, 2021   feeding supplement Liqd Take 237 mLs by mouth 2 (two) times daily between meals.   lactulose 10 GM/15ML solution Commonly known as: CHRONULAC Take 15 mLs (10 g total) by mouth 2 (two) times daily.   pantoprazole 40 MG tablet Commonly known as: PROTONIX Take 1 tablet (40 mg total) by mouth daily before breakfast.   spironolactone 50 MG tablet Commonly known as: ALDACTONE Take 1 tablet (50 mg total) by mouth daily. What changed:  medication strength how much to take        Procedures/Studies: CT ABDOMEN PELVIS WO CONTRAST  Result Date: 10/23/2021 CLINICAL DATA:  Abdominal distension. Fall. Low back pain. Cirrhosis. Diabetes. EXAM: CT ABDOMEN AND PELVIS WITHOUT CONTRAST TECHNIQUE: Multidetector CT imaging of the abdomen and pelvis was performed following the standard protocol without IV contrast. COMPARISON:  05/20/2020 abdominal ultrasound. 10/07/2018 abdominopelvic CT. FINDINGS: Lower chest: right greater than left dependent lower lobe airspace disease. A 3 mm lingular nodule on 06/05 is not readily apparent on the prior. Small right pleural effusion is new. Normal heart size. Bilateral gynecomastia. Marked periesophageal varices. Hepatobiliary: Subtle hypoattenuating right hepatic lobe mass including at 8.2 x 8.8 cm, suboptimally evaluated on this noncontrast CT. Example 19/3. Likely separate more inferior right hepatic lobe  hypoattenuating lesion of 2.2 cm on 30/3. Normal gallbladder, without biliary ductal dilatation. Pancreas: Pancreatic atrophy, without duct dilatation. Spleen: Splenomegaly, including at 18.4 cm craniocaudal. Adrenals/Urinary Tract: Normal adrenal glands. Bilateral renal collecting system calculi, maximally 1.0 cm in the interpolar left kidney. No hydronephrosis. No hydroureter or ureteric calculi. No bladder calculi. Stomach/Bowel: The gastric body is underdistended but appears thick walled on 22/3. The colon is underdistended and poorly evaluated. Normal small bowel caliber. Vascular/Lymphatic: Aortic atherosclerosis. Extensive perigastric varices. Small abdominal retroperitoneal nodes are likely reactive in the setting of cirrhosis. Index precaval 10 mm node on 38/3 is similar. No pelvic sidewall adenopathy. Reproductive: Normal prostate. Other: Moderate volume abdominopelvic ascites. No free intraperitoneal air. Musculoskeletal: No acute osseous abnormality. IMPRESSION: 1. No posttraumatic deformity identified. 2. Cirrhosis and portal venous hypertension with moderate volume ascites. Hypoattenuating liver lesions, suspicious for multifocal hepatocellular carcinoma. When the patient is clinically stable and able to follow directions and hold their breath (preferably as an outpatient) further evaluation  with dedicated abdominal MRI should be considered. 3. Right pleural effusion with bibasilar airspace disease. Most likely atelectasis. At the right lung base, infection cannot be excluded. 4. Bilateral nephrolithiasis. 5. Gastric wall thickening in the setting of underdistention. Recommend clinical exclusion of gastritis. Hypoalbuminemia could create a similar appearance. 6. 3 mm lingular nodule. No follow-up needed if patient is low-risk. Non-contrast chest CT can be considered in 12 months if patient is high-risk. This recommendation follows the consensus statement: Guidelines for Management of Incidental  Pulmonary Nodules Detected on CT Images: From the Fleischner Society 2017; Radiology 2017; 284:228-243. 7. Bilateral gynecomastia. These results were called by telephone at the time of interpretation on 10/23/2021 at 12:37 pm to provider [facility] ZAMMIT , who verbally acknowledged these results. Electronically Signed   By: Abigail Miyamoto M.D.   On: 10/23/2021 12:38   MR LIVER W WO CONTRAST  Result Date: 10/24/2021 CLINICAL DATA:  Cirrhosis, new liver lesions on CT, occlusion of main and intrahepatic portal veins EXAM: MRI ABDOMEN WITHOUT AND WITH CONTRAST TECHNIQUE: Multiplanar multisequence MR imaging of the abdomen was performed both before and after the administration of intravenous contrast. CONTRAST:  2m GADAVIST GADOBUTROL 1 MMOL/ML IV SOLN COMPARISON:  CT abdomen/pelvis dated 10/23/2021 FINDINGS: Motion degraded images. Lower chest: Moderate right and small left pleural effusions. Associated bilateral lower lobe opacities, likely atelectasis. Hepatobiliary: Cirrhosis. 9.5 cm infiltrating enhancing mass in the posterior right hepatic dome, centered in segment 7 (series 6/image 15), compatible with hepatocellular carcinoma (LI-RADS 5). Additional 2.1 cm enhancing lesion in segment 5 (series 14/image 51), also compatible with HCC (LI-RADS 5). Gallbladder is underdistended with mild gallbladder wall thickening/edema, likely secondary to venous congestion/ascites. No cholelithiasis. No intrahepatic or extrahepatic ductal dilatation. Pancreas:  Within normal limits. Spleen:  Enlarged, measuring 20.2 cm in craniocaudal dimension. Adrenals/Urinary Tract:  Adrenal glands are within normal limits. 1.6 cm cyst with layering hemorrhage in the posterior right upper kidney (series 4/image 30), benign (Bosniak II). Left kidney is within normal limits. No hydronephrosis. Stomach/Bowel: Stomach is within normal limits. Visualized bowel is grossly unremarkable. Vascular/Lymphatic:  No evidence of abdominal aortic aneurysm.  Main portal vein thrombosis (series 18/image 45), extending into the left and right portal veins (series 201/images 34 and 42). No associated findings to suggest tumor thrombus. Gastroesophageal varices. Other: Small to moderate abdominal ascites, predominantly perihepatic. Musculoskeletal: No focal osseous lesions. 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 No associated findings to suggest tumor thrombus. Cirrhosis. Splenomegaly. Gastroesophageal varices. Small to moderate abdominal ascites. Moderate right and small left pleural effusions. Electronically Signed   By: SJulian HyM.D.   On: 10/24/2021 20:27   [place]  Result Date: 10/27/2021 INDICATION: Cirrhosis, ascites EXAM: ULTRASOUND GUIDED DIAGNOSTIC AND THERAPEUTIC PARACENTESIS MEDICATIONS: None. COMPLICATIONS: None immediate. PROCEDURE: Informed written consent was obtained from the patient after a discussion of the risks, benefits and alternatives to treatment. A timeout was performed prior to the initiation of the procedure. Initial ultrasound scanning demonstrates a large amount of ascites within the right lower abdominal quadrant. The right lower abdomen was prepped and draped in the usual sterile fashion. 1% lidocaine was used for local anesthesia. Following this, a 5 [place] catheter was introduced. An ultrasound image was saved for documentation purposes. The paracentesis was performed. The catheter was removed and a dressing was applied. The patient tolerated the procedure well without immediate post procedural  complication. FINDINGS: A total of approximately 3.8 L of yellow ascitic fluid was removed. Specimens were sent to laboratory for requested analysis. IMPRESSION: Successful ultrasound-guided paracentesis yielding 3.8 liters of peritoneal fluid. Electronically Signed   By: Lavonia Dana  M.D.   On: 10/27/2021 13:41   US Paracentesis  Result Date: 10/23/2021 INDICATION: Cirrhosis, ascites EXAM: ULTRASOUND GUIDED DIAGNOSTIC AND THERAPEUTIC PARACENTESIS MEDICATIONS: None. COMPLICATIONS: None immediate. PROCEDURE: Informed written consent was obtained from the patient after a discussion of the risks, benefits and alternatives to treatment. A timeout was performed prior to the initiation of the procedure. Initial ultrasound scanning demonstrates a large amount of ascites within the LEFT lower abdominal quadrant. The right lower abdomen was prepped and draped in the usual sterile fashion. 1% lidocaine was used for local anesthesia. Following this, a 5 [place] Yueh catheter was introduced. An ultrasound image was saved for documentation purposes. The paracentesis was performed. The catheter was removed and a dressing was applied. The patient tolerated the procedure well without immediate post procedural complication. FINDINGS: A total of approximately 3.6 L of yellow ascitic fluid was removed. Samples were sent to the laboratory as requested by the clinical team. IMPRESSION: Successful ultrasound-guided paracentesis yielding 3.6 liters of peritoneal fluid. Electronically Signed   By: Lavonia Dana M.D.   On: 10/23/2021 15:00   DG CHEST PORT 1 VIEW  Result Date: 10/26/2021 CLINICAL DATA:  Leukocytosis.  Cough and short of breath EXAM: PORTABLE CHEST 1 VIEW COMPARISON:  10/23/2021 FINDINGS: Right lower left airspace disease and right pleural effusion unchanged from the prior study. Small left effusion unchanged. Heart size and vascularity normal.  Negative for heart failure. IMPRESSION: No significant interval change. Right lower lobe airspace disease and right pleural effusion unchanged Electronically Signed   By: Franchot Gallo M.D.   On: 10/26/2021 14:11   DG Chest Port 1 View  Result Date: 10/23/2021 CLINICAL DATA:  Weakness EXAM: PORTABLE CHEST 1 VIEW COMPARISON:  Chest x-ray 09/22/2021  FINDINGS: Heart size is upper normal. Mediastinum appears stable. Mild calcified plaques in the aortic arch. Mild pulmonary vascular/interstitial prominence. Small left and moderate right pleural effusion with associated atelectasis/infiltrate. No pneumothorax. IMPRESSION: Bilateral pleural effusions right greater than left. Mild pulmonary vascular prominence. Electronically Signed   By: Ofilia Neas M.D.   On: 10/23/2021 11:56   [place] ASCITES (ABDOMEN LIMITED)  Result Date: 10/30/2021 CLINICAL DATA:  60 year old male with history of ascites. EXAM: LIMITED ABDOMEN ULTRASOUND FOR ASCITES TECHNIQUE: Limited ultrasound survey for ascites was performed in all four abdominal quadrants. COMPARISON:  Multiple priors, most recently 10/27/2021. FINDINGS: Small volume of ascites noted predominantly in the right-side of the abdomen, much less than prior examinations and insufficient to safely perform paracentesis for therapeutic purposes. IMPRESSION: 1. Small volume of ascites. Electronically Signed   By: Vinnie Langton M.D.   On: 10/30/2021 10:24   US LIVER DOPPLER  Result Date: 10/24/2021 CLINICAL DATA:  60 year old male with a history of hepatic cirrhosis and recent imaging findings concerning for hepatocellular carcinoma and portal vein occlusion. EXAM: DUPLEX ULTRASOUND OF LIVER TECHNIQUE: Color and duplex Doppler ultrasound was performed to evaluate the hepatic in-flow and out-flow vessels. COMPARISON:  CT abdomen/pelvis 10/23/2021 FINDINGS: Liver: Markedly nodular hepatic contour. Diffuse heterogeneity of the hepatic parenchyma. Discrete hepatic lesions are difficult to visualize given the heterogeneity of the background parenchyma. No biliary ductal dilatation. 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 Heterogeneously echogenic material is visualized within the vessel  lumen. Hepatic Vein Velocities Right:  42 cm/sec Middle:  45 cm/sec Left:  43 cm/sec IVC:  Questionable thrombus visualized within the IVC. Hepatic Artery Velocity:  46 cm/sec Splenic Vein Velocity: Occluded Spleen: 18.1 cm x 7.3 cm x 19.6 cm with a total volume of 1,352 cm^3 (411 cm^3 is upper limit normal) Portal Vein Occlusion/Thrombus: Yes Splenic Vein Occlusion/Thrombus: Yes Ascites: Present Varices: Present IMPRESSION: 1. 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 It is unclear if this represents bland thrombus or tumor thrombus. 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 2. Questionable thrombus within the inferior vena cava. If truly representative of thrombus, this would almost certainly be tumor thrombus extending through the hepatic veins into the IVC. 3. Small cirrhotic liver with heavily nodular contour. 4. Although suspected on CT imaging, discrete hepatic mass and lesions not visualized likely due to the severe heterogeneity of the background parenchyma. 5. Ascites. Recommend further evaluation with gadolinium-enhanced MRI of the abdomen to assess for hepatocellular carcinoma and tumor thrombus within the IVC and portal veins. Electronically Signed   By: Jacqulynn Cadet M.D.   On: 10/24/2021 13:17     Subjective: Pt with no specific complaints. He is agreeable to SNF placement.  Discharge Exam: Vitals:   10/29/21 2100 10/30/21 0520  BP: (!) 108/47 93/65  Pulse: (!) 109 (!) 101  Resp: 20 18  Temp: 98.8 F (37.1 C) 97.6 F (36.4 C)  SpO2: 93% 95%   Vitals:   10/29/21 1459 10/29/21 1634 10/29/21 2100 10/30/21 0520  BP:   (!) 108/47 93/65  Pulse: (!) 107 (!) 105 (!) 109 (!) 101  Resp:   20 18  Temp:   98.8 F (37.1 C) 97.6 F (36.4 C)  TempSrc:   Oral Oral  SpO2: 94% 94% 93% 95%  Weight:    99.7 kg  Height:       General exam: Appears calm and comfortable.  Appears very ill, appears jaundiced, appears older than stated age.  Respiratory system: Clear to auscultation. Respiratory effort normal. Cardiovascular system: normal S1 & S2 heard. No JVD, murmurs,  rubs, gallops or clicks.  +3 edema bilateral feet and legs  gastrointestinal system: Abdomen extended, nontender Central nervous system: Alert and oriented. No focal neurological deficits. Skin: Jaundice  psychiatry: Judgement and insight appear normal. Mood & affect appropriate.    The results of significant diagnostics from this hospitalization (including imaging, microbiology, ancillary and laboratory) are listed below for reference.     Microbiology: Recent Results (from the past 240 hour(s))  Culture, blood (single)     Status: None   Collection Time: 10/23/21 12:52 PM   Specimen: Right Antecubital; Blood  Result Value Ref Range Status   Specimen Description   Final    RIGHT ANTECUBITAL Performed at [facility], [address] [place], [place] [address]    Special Requests   Final    BOTTLES DRAWN AEROBIC AND ANAEROBIC Blood Culture adequate volume Performed at [address] [place], [place] [address]    Culture  Setup Time   Final    AEB AND ANA BOTTLES Gram Stain Report Called to,Read Back By and Verified With: Coralie Common RN [phone] K FORSYTH CORRECTED RESULTS NO ORGANISMS SEEN PREVIOUSLY REPORTED AS: GRAM POSITIVE COCCI IN CLUSTERS CORRECTED RESULTS CALLED TO: PHARMD S.HURTH AT 1448 ON 10/25/2021 BY T.SAAD. Performed at [facility], [place] [address] [place], [place] [address]    Culture   Final    NO GROWTH  4 DAYS Performed at [facility], [address], [place], [place] [address]    Report Status 10/27/2021 FINAL  Final  Blood Culture ID Panel (Reflexed)     Status: None   Collection Time: 10/23/21 12:52 PM  Result Value Ref Range Status   Enterococcus faecalis NOT DETECTED NOT DETECTED Final   Enterococcus Faecium NOT DETECTED NOT DETECTED Final   Listeria monocytogenes NOT DETECTED NOT DETECTED Final   Staphylococcus species NOT DETECTED NOT DETECTED Final   Staphylococcus aureus (BCID) NOT DETECTED NOT DETECTED Final   Staphylococcus  epidermidis NOT DETECTED NOT DETECTED Final   Staphylococcus lugdunensis NOT DETECTED NOT DETECTED Final   Streptococcus species NOT DETECTED NOT DETECTED Final   Streptococcus agalactiae NOT DETECTED NOT DETECTED Final   Streptococcus pneumoniae NOT DETECTED NOT DETECTED Final   Streptococcus pyogenes NOT DETECTED NOT DETECTED Final   A.calcoaceticus-baumannii NOT DETECTED NOT DETECTED Final   Bacteroides fragilis NOT DETECTED NOT DETECTED Final   Enterobacterales NOT DETECTED NOT DETECTED Final   Enterobacter cloacae complex NOT DETECTED NOT DETECTED Final   Escherichia coli NOT DETECTED NOT DETECTED Final   Klebsiella aerogenes NOT DETECTED NOT DETECTED Final   Klebsiella oxytoca NOT DETECTED NOT DETECTED Final   Klebsiella pneumoniae NOT DETECTED NOT DETECTED Final   Proteus species NOT DETECTED NOT DETECTED Final   Salmonella species NOT DETECTED NOT DETECTED Final   Serratia marcescens NOT DETECTED NOT DETECTED Final   Haemophilus influenzae NOT DETECTED NOT DETECTED Final   Neisseria meningitidis NOT DETECTED NOT DETECTED Final   Pseudomonas aeruginosa NOT DETECTED NOT DETECTED Final   Stenotrophomonas maltophilia NOT DETECTED NOT DETECTED Final   Candida albicans NOT DETECTED NOT DETECTED Final   Candida auris NOT DETECTED NOT DETECTED Final   Candida glabrata NOT DETECTED NOT DETECTED Final   Candida krusei NOT DETECTED NOT DETECTED Final   Candida parapsilosis NOT DETECTED NOT DETECTED Final   Candida tropicalis NOT DETECTED NOT DETECTED Final   Cryptococcus neoformans/gattii NOT DETECTED NOT DETECTED Final    Comment: Performed at [facility], [address], [place], [place] [address]  Culture, blood (single)     Status: None   Collection Time: 10/23/21 12:53 PM   Specimen: BLOOD LEFT HAND  Result Value Ref Range Status   Specimen Description BLOOD LEFT HAND  Final   Special Requests   Final    BOTTLES DRAWN AEROBIC AND ANAEROBIC Blood Culture results may not be  optimal due to an inadequate volume of blood received in culture bottles   Culture   Final    NO GROWTH 5 DAYS Performed at [facility], [address] [place], [place] [address]    Report Status 10/28/2021 FINAL  Final  Acid Fast Smear (AFB)     Status: None   Collection Time: 10/23/21  2:05 PM   Specimen: Peritoneal Cavity; Body Fluid  Result Value Ref Range Status   AFB Specimen Processing Concentration  Final   Acid Fast Smear Negative  Final    Comment: (NOTE) Performed At: [facility] [place], [place] [id number] Rush Farmer MD QA:[id number]    Source (AFB) PERITONEAL  Final    Comment: Performed at [facility], [address] [place], [place] [address]  Culture, body fluid w Gram Stain-bottle     Status: None   Collection Time: 10/23/21  2:05 PM   Specimen: Peritoneal Washings  Result Value Ref Range Status   Specimen Description PERITONEAL  Final   Special  Requests   Final    BOTTLES DRAWN AEROBIC AND ANAEROBIC Blood Culture adequate volume   Culture   Final    NO GROWTH 5 DAYS Performed at [facility], [address] [place], [place] [address]    Report Status 10/28/2021 FINAL  Final  Gram stain     Status: None   Collection Time: 10/23/21  2:05 PM   Specimen: Peritoneal Washings  Result Value Ref Range Status   Specimen Description PERITONEAL  Final   Special Requests NONE  Final   Gram Stain   Final    GRAM POSITIVE COCCI IN CLUSTERS CYTOSPIN SMEAR Gram Stain Report Called to,Read Back By and Verified With: Coralie Common RN [phone] K FORSYTH Performed at [facility], [address] [place], [place] [address]    Report Status 10/23/2021 FINAL  Final  Resp Panel by RT-PCR (Flu A&B, Covid) Nasopharyngeal Swab     Status: None   Collection Time: 10/23/21  2:10 PM   Specimen: Nasopharyngeal Swab; Nasopharyngeal(NP) swabs in vial transport medium  Result Value Ref Range Status   SARS Coronavirus 2 by RT PCR NEGATIVE NEGATIVE Final     Comment: (NOTE) SARS-CoV-2 target nucleic acids are NOT DETECTED.  The SARS-CoV-2 RNA is generally detectable in upper respiratory specimens during the acute phase of infection. The lowest concentration of SARS-CoV-2 viral copies this assay can detect is 138 copies/mL. A negative result does not preclude SARS-Cov-2 infection and should not be used as the sole basis for treatment or other patient management decisions. A negative result may occur with  improper specimen collection/handling, submission of specimen other than nasopharyngeal swab, presence of viral mutation(s) within the areas targeted by this assay, and inadequate number of viral copies(<138 copies/mL). A negative result must be combined with clinical observations, patient history, and epidemiological information. The expected result is Negative.  Fact Sheet for Patients:  EntrepreneurPulse.com.au  Fact Sheet for Healthcare Providers:  IncredibleEmployment.be  This test is no t yet approved or cleared by the [place] FDA and  has been authorized for detection and/or diagnosis of SARS-CoV-2 by FDA under an Emergency Use Authorization (EUA). This EUA will remain  in effect (meaning this test can be used) for the duration of the COVID-19 declaration under Section 564(b)(1) of the Act, 21 U.S.C.section 360bbb-3(b)(1), unless the authorization is terminated  or revoked sooner.       Influenza A by PCR NEGATIVE NEGATIVE Final   Influenza B by PCR NEGATIVE NEGATIVE Final    Comment: (NOTE) The Xpert Xpress SARS-CoV-2/FLU/RSV plus assay is intended as an aid in the diagnosis of influenza from Nasopharyngeal swab specimens and should not be used as a sole basis for treatment. Nasal washings and aspirates are unacceptable for Xpert Xpress SARS-CoV-2/FLU/RSV testing.  Fact Sheet for Patients: EntrepreneurPulse.com.au  Fact Sheet for Healthcare  Providers: IncredibleEmployment.be  This test is not yet approved or cleared by the [place] FDA and has been authorized for detection and/or diagnosis of SARS-CoV-2 by FDA under an Emergency Use Authorization (EUA). This EUA will remain in effect (meaning this test can be used) for the duration of the COVID-19 declaration under Section 564(b)(1) of the Act, 21 U.S.C. section 360bbb-3(b)(1), unless the authorization is terminated or revoked.  Performed at [facility], [address], [place], [place] [address]   MRSA Next Gen by PCR, Nasal     Status: None   Collection Time: 10/23/21  6:17 PM   Specimen: Nasal Mucosa; Nasal Swab  Result  Value Ref Range Status   MRSA by PCR Next Gen NOT DETECTED NOT DETECTED Final    Comment: (NOTE) The GeneXpert MRSA Assay (FDA approved for NASAL specimens only), is one component of a comprehensive MRSA colonization surveillance program. It is not intended to diagnose MRSA infection nor to guide or monitor treatment for MRSA infections. Test performance is not FDA approved in patients less than 34 years old. Performed at [facility], [address] [place], [place] [address]   Gram stain     Status: None   Collection Time: 10/27/21 12:42 PM   Specimen: Peritoneal Washings  Result Value Ref Range Status   Specimen Description PERITONEAL  Final   Special Requests NONE  Final   Gram Stain   Final    WBC PRESENT,BOTH PMN AND MONONUCLEAR CYTOSPIN SMEAR NO ORGANISMS SEEN Performed at [facility], [address], [place], [place] [address]    Report Status 10/27/2021 FINAL  Final  Culture, body fluid w Gram Stain-bottle     Status: None (Preliminary result)   Collection Time: 10/27/21 12:42 PM   Specimen: Peritoneal Washings  Result Value Ref Range Status   Specimen Description PERITONEAL BOTTLES DRAWN AEROBIC AND ANAEROBIC  Final   Special Requests 10CC  Final   Culture   Final    NO GROWTH 3 DAYS Performed at [facility], [address] [place], [place] [address]    Report Status PENDING  Incomplete     Labs: BNP (last 3 results) No results for input(s): BNP in the last 8760 hours. Basic Metabolic Panel: Recent Labs  Lab 10/24/21 0517 10/25/21 0506 10/26/21 0521 10/27/21 0508 10/28/21 0500 10/29/21 0413 10/30/21 0925  NA 129* 131* 129* 129* 131* 130* 131*  K 3.9 3.3* 3.9 3.8 3.4* 3.5 4.4  CL 96* 95* 98 98 98 96* 96*  CO2 26 23 20* 21* _0 GLUCOSE 47* 50* 75 101* 79 41* 92  BUN 37* 32* 26* 26* 26* 24* 28*  CREATININE 1.18 0.94 0.75 0.79 0.79 0.70 0.62  CALCIUM 8.6* 9.2 9.3 9.5 9.8 9.8 10.1  MG 2.2 2.4  --   --   --   --   --    Liver Function Tests: Recent Labs  Lab 10/25/21 0506 10/26/21 0521 10/27/21 0508 10/28/21 0500 10/29/21 0413  AST 76* 74* 55* 58* 65*  ALT 33 33 34 30 34  ALKPHOS 418* 437* 427* 444* 485*  BILITOT 6.2* 7.5* 7.1* 7.2* 7.6*  PROT 5.2* 4.9* 4.8* 4.9* 5.1*  ALBUMIN 2.9* 2.6* 2.6* 2.7* 2.6*   No results for input(s): LIPASE, AMYLASE in the last 168 hours. Recent Labs  Lab 10/24/21 0517 10/25/21 0506 10/26/21 0521 10/27/21 0508  AMMONIA 49* 55* 43* 36*   CBC: Recent Labs  Lab 10/24/21 0517 10/25/21 0506 10/26/21 0521 10/27/21 0508 10/28/21 0500  WBC 100.3* 86.6* 101.8* 103.9*  --   NEUTROABS  --  78.8* 94.7* 91.4*  --   HGB 9.4* 9.2* 9.9* 9.7* 9.6*  HCT 28.9* 27.0* 30.6* 29.2* 28.5*  MCV 89.8 87.1 89.7 89.6  --   PLT 76* 76* 78* 87*  --    Cardiac Enzymes: No results for input(s): CKTOTAL, CKMB, CKMBINDEX, TROPONINI in the last 168 hours. BNP: Invalid input(s): POCBNP CBG: Recent Labs  Lab 10/27/21 0721 10/27/21 1115 10/27/21 1612 10/27/21 2051 10/29/21 1040  GLUCAP 96 225* 156* 170* 232*   D-Dimer No results for input(s): DDIMER in the last 72 hours. Hgb A1c No  results for input(s): HGBA1C in the last 72 hours. Lipid Profile No results for input(s): CHOL, HDL, LDLCALC, TRIG, CHOLHDL, LDLDIRECT in the last 72  hours. Thyroid function studies No results for input(s): TSH, T4TOTAL, T3FREE, THYROIDAB in the last 72 hours.  Invalid input(s): FREET3 Anemia work up No results for input(s): VITAMINB12, FOLATE, FERRITIN, TIBC, IRON, RETICCTPCT in the last 72 hours. Urinalysis    Component Value Date/Time   COLORURINE YELLOW 07/19/2019 1553   APPEARANCEUR HAZY (A) 07/19/2019 1553   LABSPEC 1.036 (H) 07/19/2019 1553   PHURINE 5.0 07/19/2019 1553   GLUCOSEU NEGATIVE 07/19/2019 1553   HGBUR SMALL (A) 07/19/2019 1553   BILIRUBINUR NEGATIVE 07/19/2019 1553   KETONESUR NEGATIVE 07/19/2019 1553   PROTEINUR NEGATIVE 07/19/2019 1553   NITRITE NEGATIVE 07/19/2019 1553   LEUKOCYTESUR TRACE (A) 07/19/2019 1553   Sepsis Labs Invalid input(s): PROCALCITONIN,  WBC,  LACTICIDVEN Microbiology Recent Results (from the past 240 hour(s))  Culture, blood (single)     Status: None   Collection Time: 10/23/21 12:52 PM   Specimen: Right Antecubital; Blood  Result Value Ref Range Status   Specimen Description   Final    RIGHT ANTECUBITAL Performed at [facility], [address] [place], [place] [address]    Special Requests   Final    BOTTLES DRAWN AEROBIC AND ANAEROBIC Blood Culture adequate volume Performed at [facility], [address] [place], [place] [address]    Culture  Setup Time   Final    AEB AND ANA BOTTLES Gram Stain Report Called to,Read Back By and Verified With: Coralie Common RN [phone] K FORSYTH CORRECTED RESULTS NO ORGANISMS SEEN PREVIOUSLY REPORTED AS: GRAM POSITIVE COCCI IN CLUSTERS CORRECTED RESULTS CALLED TO: PHARMD S.HURTH AT 1962 ON 10/25/2021 BY T.SAAD. Performed at [facility], [place] [address], [place], [place] [address]    Culture   Final    NO GROWTH 4 DAYS Performed at [facility], [address] [place], [place] [address]    Report Status 10/27/2021 FINAL  Final  Blood Culture ID Panel (Reflexed)     Status: None   Collection Time: 10/23/21 12:52 PM  Result Value Ref  Range Status   Enterococcus faecalis NOT DETECTED NOT DETECTED Final   Enterococcus Faecium NOT DETECTED NOT DETECTED Final   Listeria monocytogenes NOT DETECTED NOT DETECTED Final   Staphylococcus species NOT DETECTED NOT DETECTED Final   Staphylococcus aureus (BCID) NOT DETECTED NOT DETECTED Final   Staphylococcus epidermidis NOT DETECTED NOT DETECTED Final   Staphylococcus lugdunensis NOT DETECTED NOT DETECTED Final   Streptococcus species NOT DETECTED NOT DETECTED Final   Streptococcus agalactiae NOT DETECTED NOT DETECTED Final   Streptococcus pneumoniae NOT DETECTED NOT DETECTED Final   Streptococcus pyogenes NOT DETECTED NOT DETECTED Final   A.calcoaceticus-baumannii NOT DETECTED NOT DETECTED Final   Bacteroides fragilis NOT DETECTED NOT DETECTED Final   Enterobacterales NOT DETECTED NOT DETECTED Final   Enterobacter cloacae complex NOT DETECTED NOT DETECTED Final   Escherichia coli NOT DETECTED NOT DETECTED Final   Klebsiella aerogenes NOT DETECTED NOT DETECTED Final   Klebsiella oxytoca NOT DETECTED NOT DETECTED Final   Klebsiella pneumoniae NOT DETECTED NOT DETECTED Final   Proteus species NOT DETECTED NOT DETECTED Final   Salmonella species NOT DETECTED NOT DETECTED Final   Serratia marcescens NOT DETECTED NOT DETECTED Final   Haemophilus influenzae NOT DETECTED NOT DETECTED Final   Neisseria meningitidis NOT DETECTED NOT DETECTED Final   Pseudomonas aeruginosa NOT DETECTED NOT DETECTED  Final   Stenotrophomonas maltophilia NOT DETECTED NOT DETECTED Final   Candida albicans NOT DETECTED NOT DETECTED Final   Candida auris NOT DETECTED NOT DETECTED Final   Candida glabrata NOT DETECTED NOT DETECTED Final   Candida krusei NOT DETECTED NOT DETECTED Final   Candida parapsilosis NOT DETECTED NOT DETECTED Final   Candida tropicalis NOT DETECTED NOT DETECTED Final   Cryptococcus neoformans/gattii NOT DETECTED NOT DETECTED Final    Comment: Performed at [facility], [place] [address] [place], [place] [address]  Culture, blood (single)     Status: None   Collection Time: 10/23/21 12:53 PM   Specimen: BLOOD LEFT HAND  Result Value Ref Range Status   Specimen Description BLOOD LEFT HAND  Final   Special Requests   Final    BOTTLES DRAWN AEROBIC AND ANAEROBIC Blood Culture results may not be optimal due to an inadequate volume of blood received in culture bottles   Culture   Final    NO GROWTH 5 DAYS Performed at [facility], [address], [place], [place] [address]    Report Status 10/28/2021 FINAL  Final  Acid Fast Smear (AFB)     Status: None   Collection Time: 10/23/21  2:05 PM   Specimen: Peritoneal Cavity; Body Fluid  Result Value Ref Range Status   AFB Specimen Processing Concentration  Final   Acid Fast Smear Negative  Final    Comment: (NOTE) Performed At: [facility] [place], [place] [id number] Rush Farmer MD BW:[id number]    Source (AFB) PERITONEAL  Final    Comment: Performed at [facility], [address] [place], [place] [address]  Culture, body fluid w Gram Stain-bottle     Status: None   Collection Time: 10/23/21  2:05 PM   Specimen: Peritoneal Washings  Result Value Ref Range Status   Specimen Description PERITONEAL  Final   Special Requests   Final    BOTTLES DRAWN AEROBIC AND ANAEROBIC Blood Culture adequate volume   Culture   Final    NO GROWTH 5 DAYS Performed at [facility], [address] [place], [place] [address]    Report Status 10/28/2021 FINAL  Final  Gram stain     Status: None   Collection Time: 10/23/21  2:05 PM   Specimen: Peritoneal Washings  Result Value Ref Range Status   Specimen Description PERITONEAL  Final   Special Requests NONE  Final   Gram Stain   Final    GRAM POSITIVE COCCI IN CLUSTERS CYTOSPIN SMEAR Gram Stain Report Called to,Read Back By and Verified With: Coralie Common RN [phone] K FORSYTH Performed at [facility], [address] [place], [place] [address]    Report  Status 10/23/2021 FINAL  Final  Resp Panel by RT-PCR (Flu A&B, Covid) Nasopharyngeal Swab     Status: None   Collection Time: 10/23/21  2:10 PM   Specimen: Nasopharyngeal Swab; Nasopharyngeal(NP) swabs in vial transport medium  Result Value Ref Range Status   SARS Coronavirus 2 by RT PCR NEGATIVE NEGATIVE Final    Comment: (NOTE) SARS-CoV-2 target nucleic acids are NOT DETECTED.  The SARS-CoV-2 RNA is generally detectable in upper respiratory specimens during the acute phase of infection. The lowest concentration of SARS-CoV-2 viral copies this assay can detect is 138 copies/mL. A negative result does not preclude SARS-Cov-2 infection and should not be used as the sole basis for treatment or other patient management decisions. A negative result may occur with  improper specimen collection/handling, submission of specimen other than nasopharyngeal swab, presence of viral mutation(s) within the areas targeted by this assay, and inadequate number of viral copies(<138 copies/mL). A negative result must be combined with clinical observations, patient history, and epidemiological information. The expected result is Negative.  Fact Sheet for Patients:  EntrepreneurPulse.com.au  Fact Sheet for Healthcare Providers:  IncredibleEmployment.be  This test is no t yet approved or cleared by the [place] FDA and  has been authorized for detection and/or diagnosis of SARS-CoV-2 by FDA under an Emergency Use Authorization (EUA). This EUA will remain  in effect (meaning this test can be used) for the duration of the COVID-19 declaration under Section 564(b)(1) of the Act, 21 U.S.C.section 360bbb-3(b)(1), unless the authorization is terminated  or revoked sooner.       Influenza A by PCR NEGATIVE NEGATIVE Final   Influenza B by PCR NEGATIVE NEGATIVE Final    Comment: (NOTE) The Xpert Xpress SARS-CoV-2/FLU/RSV plus assay is intended as an aid in the  diagnosis of influenza from Nasopharyngeal swab specimens and should not be used as a sole basis for treatment. Nasal washings and aspirates are unacceptable for Xpert Xpress SARS-CoV-2/FLU/RSV testing.  Fact Sheet for Patients: EntrepreneurPulse.com.au  Fact Sheet for Healthcare Providers: IncredibleEmployment.be  This test is not yet approved or cleared by the [place] FDA and has been authorized for detection and/or diagnosis of SARS-CoV-2 by FDA under an Emergency Use Authorization (EUA). This EUA will remain in effect (meaning this test can be used) for the duration of the COVID-19 declaration under Section 564(b)(1) of the Act, 21 U.S.C. section 360bbb-3(b)(1), unless the authorization is terminated or revoked.  Performed at [facility], [address] [place], [place] [address]   MRSA Next Gen by PCR, Nasal     Status: None   Collection Time: 10/23/21  6:17 PM   Specimen: Nasal Mucosa; Nasal Swab  Result Value Ref Range Status   MRSA by PCR Next Gen NOT DETECTED NOT DETECTED Final    Comment: (NOTE) The GeneXpert MRSA Assay (FDA approved for NASAL specimens only), is one component of a comprehensive MRSA colonization surveillance program. It is not intended to diagnose MRSA infection nor to guide or monitor treatment for MRSA infections. Test performance is not FDA approved in patients less than 18 years old. Performed at [facility], [address], [place], [place] [address]   Gram stain     Status: None   Collection Time: 10/27/21 12:42 PM   Specimen: Peritoneal Washings  Result Value Ref Range Status   Specimen Description PERITONEAL  Final   Special Requests NONE  Final   Gram Stain   Final    WBC PRESENT,BOTH PMN AND MONONUCLEAR CYTOSPIN SMEAR NO ORGANISMS SEEN Performed at [facility], [address], [place], [place] [address]    Report Status 10/27/2021 FINAL  Final  Culture, body fluid w Gram Stain-bottle      Status: None (Preliminary result)   Collection Time: 10/27/21 12:42 PM   Specimen: Peritoneal Washings  Result Value Ref Range Status   Specimen Description PERITONEAL BOTTLES DRAWN AEROBIC AND ANAEROBIC  Final   Special Requests 10CC  Final   Culture   Final    NO GROWTH 3 DAYS Performed at [facility], [address], [place], [place] [address]    Report Status PENDING  Incomplete    Time coordinating discharge:   SIGNED:  Irwin Brakeman, MD  [facility] 10/30/2021, 11:59 AM How to contact the  TR[facility] Attending or Consulting provider [place] or covering provider during after hours [place], for this patient?  Check the care team in [facility] and look for a) attending/consulting [facility] provider listed and b) the [facility] team listed Log into [email or web address] and use [place]'s universal password to access. If you do not have the password, please contact the hospital operator. Locate the [facility] provider you are looking for under [facility] and page to a number that you can be directly reached. If you still have difficulty reaching the provider, please page the [facility] (Director on Call) for the Hospitalists listed on amion for assistance.

## 2021-10-30 NOTE — TOC Progression Note (Signed)
Transition of Care [facility]) - Progression Note    Patient Details  Name: Lawrence Rogers MRN: [id number] Date of Birth: 02/16/1961  [facility] of [facility]) CM/SW Contact  Boneta Lucks, RN Phone Number: 10/30/2021, 4:40 PM  Clinical Narrative:   Hospice will reassess patient tomorrow. Held off on bed offer today due to confusion of patient needing a paratenesis. MD spoke with Dr Bradley Ferris. They have no beds. Will assess for GIP tomorrow.    Expected Discharge Plan: [place] Barriers to Discharge: Continued Medical Work up  Expected Discharge Plan and Services Expected Discharge Plan: [place]    Expected Discharge Date: 10/30/21                  HH Arranged: RN, PT, Nurse's Aide [place] Agency: [place] Date [facility] Agency Contacted: 10/26/21 Time [place]: [phone]

## 2021-10-31 ENCOUNTER — Inpatient Hospital Stay (HOSPITAL_COMMUNITY)
Admission: RE | Admit: 2021-10-31 | Discharge: 2021-11-01 | Disposition: A | Discharge status: Hospice | Payer: Managed Care, Other (non HMO) | Source: Ambulatory Visit | Attending: Family Medicine | Admitting: Family Medicine

## 2021-10-31 ENCOUNTER — Encounter (HOSPITAL_COMMUNITY): Payer: Self-pay

## 2021-10-31 DIAGNOSIS — R160 Hepatomegaly, not elsewhere classified: Secondary | ICD-10-CM | POA: Diagnosis present

## 2021-10-31 DIAGNOSIS — K766 Portal hypertension: Secondary | ICD-10-CM | POA: Diagnosis present

## 2021-10-31 DIAGNOSIS — E8770 Fluid overload, unspecified: Secondary | ICD-10-CM | POA: Diagnosis present

## 2021-10-31 DIAGNOSIS — D72823 Leukemoid reaction: Secondary | ICD-10-CM | POA: Diagnosis present

## 2021-10-31 DIAGNOSIS — Z515 Encounter for palliative care: Secondary | ICD-10-CM

## 2021-10-31 DIAGNOSIS — I10 Essential (primary) hypertension: Secondary | ICD-10-CM | POA: Diagnosis present

## 2021-10-31 DIAGNOSIS — Z20822 Contact with and (suspected) exposure to covid-19: Secondary | ICD-10-CM | POA: Diagnosis present

## 2021-10-31 DIAGNOSIS — K760 Fatty (change of) liver, not elsewhere classified: Secondary | ICD-10-CM | POA: Diagnosis present

## 2021-10-31 DIAGNOSIS — Z66 Do not resuscitate: Secondary | ICD-10-CM | POA: Diagnosis present

## 2021-10-31 DIAGNOSIS — C220 Liver cell carcinoma: Secondary | ICD-10-CM | POA: Diagnosis present

## 2021-10-31 DIAGNOSIS — Z9114 Patient's other noncompliance with medication regimen: Secondary | ICD-10-CM

## 2021-10-31 DIAGNOSIS — E7220 Disorder of urea cycle metabolism, unspecified: Secondary | ICD-10-CM | POA: Diagnosis present

## 2021-10-31 DIAGNOSIS — E871 Hypo-osmolality and hyponatremia: Secondary | ICD-10-CM | POA: Diagnosis present

## 2021-10-31 DIAGNOSIS — N179 Acute kidney failure, unspecified: Secondary | ICD-10-CM | POA: Diagnosis present

## 2021-10-31 DIAGNOSIS — K7460 Unspecified cirrhosis of liver: Secondary | ICD-10-CM | POA: Diagnosis present

## 2021-10-31 DIAGNOSIS — R188 Other ascites: Secondary | ICD-10-CM | POA: Diagnosis present

## 2021-10-31 DIAGNOSIS — E43 Unspecified severe protein-calorie malnutrition: Secondary | ICD-10-CM | POA: Diagnosis present

## 2021-10-31 DIAGNOSIS — K767 Hepatorenal syndrome: Secondary | ICD-10-CM | POA: Diagnosis present

## 2021-10-31 DIAGNOSIS — K652 Spontaneous bacterial peritonitis: Secondary | ICD-10-CM | POA: Diagnosis present

## 2021-10-31 DIAGNOSIS — I8510 Secondary esophageal varices without bleeding: Secondary | ICD-10-CM | POA: Diagnosis present

## 2021-10-31 DIAGNOSIS — E872 Acidosis, unspecified: Secondary | ICD-10-CM | POA: Diagnosis present

## 2021-10-31 MED ORDER — ONDANSETRON HCL 4 MG PO TABS: 4.0000 mg | ORAL_TABLET | Freq: Four times a day (QID) | ORAL | Status: DC | PRN

## 2021-10-31 MED ORDER — HYDROMORPHONE HCL 1 MG/ML IJ SOLN
0.5000 mg | INTRAMUSCULAR | Status: DC | PRN
Start: 2021-10-31 — End: 2021-11-01
  Administered 2021-10-31: 0.5 mg via INTRAVENOUS
  Filled 2021-10-31: qty 0.5

## 2021-10-31 MED ORDER — BUMETANIDE 1 MG PO TABS
1.0000 mg | ORAL_TABLET | Freq: Every day | ORAL | Status: DC
  Administered 2021-11-01: 1 mg via ORAL
  Filled 2021-10-31: qty 1

## 2021-10-31 MED ORDER — ACETAMINOPHEN 325 MG PO TABS
650.0000 mg | ORAL_TABLET | Freq: Four times a day (QID) | ORAL | Status: DC | PRN
  Administered 2021-11-01: 650 mg via ORAL
  Filled 2021-10-31: qty 2

## 2021-10-31 MED ORDER — METHOCARBAMOL 500 MG PO TABS: 500.0000 mg | ORAL_TABLET | Freq: Four times a day (QID) | ORAL | Status: DC | PRN

## 2021-10-31 MED ORDER — ACETAMINOPHEN 650 MG RE SUPP: 650.0000 mg | Freq: Four times a day (QID) | RECTAL | Status: DC | PRN

## 2021-10-31 MED ORDER — SPIRONOLACTONE 25 MG PO TABS
50.0000 mg | ORAL_TABLET | Freq: Every day | ORAL | Status: DC
  Administered 2021-11-01: 50 mg via ORAL
  Filled 2021-10-31: qty 2

## 2021-10-31 MED ORDER — CIPROFLOXACIN HCL 250 MG PO TABS
500.0000 mg | ORAL_TABLET | Freq: Every day | ORAL | Status: DC
  Administered 2021-11-01: 500 mg via ORAL
  Filled 2021-10-31: qty 2

## 2021-10-31 MED ORDER — LACTULOSE 10 GM/15ML PO SOLN
10.0000 g | Freq: Two times a day (BID) | ORAL | Status: DC
  Administered 2021-10-31 – 2021-11-01 (×2): 10 g via ORAL
  Filled 2021-10-31 (×2): qty 30

## 2021-10-31 MED ORDER — ONDANSETRON HCL 4 MG/2ML IJ SOLN: 4.0000 mg | Freq: Four times a day (QID) | INTRAMUSCULAR | Status: DC | PRN

## 2021-10-31 MED ORDER — FERROUS SULFATE 325 (65 FE) MG PO TABS
325.0000 mg | ORAL_TABLET | Freq: Every day | ORAL | Status: DC
  Administered 2021-11-01: 325 mg via ORAL
  Filled 2021-10-31: qty 1

## 2021-10-31 MED ORDER — ENSURE ENLIVE PO LIQD: 237.0000 mL | Freq: Two times a day (BID) | ORAL | Status: DC

## 2021-10-31 MED ORDER — PANTOPRAZOLE SODIUM 40 MG PO TBEC
40.0000 mg | DELAYED_RELEASE_TABLET | Freq: Every day | ORAL | Status: DC
  Administered 2021-11-01: 40 mg via ORAL
  Filled 2021-10-31: qty 1

## 2021-10-31 NOTE — Progress Notes (Signed)
PT Cancellation Note  Patient Details Name: Lawrence Rogers MRN: [id number] DOB: [birth year]   Cancelled Treatment:    Reason Eval/Treat Not Completed: Other (comment) (comfort care/hospice). Per chart review, pt is now comfort care and hospice. Will sign off at this time, please re-consult PT if needs arise.    Talbot Grumbling PT, DPT 10/31/21, 2:09 PM

## 2021-10-31 NOTE — TOC Progression Note (Signed)
Transition of Care [facility]) - Progression Note    Patient Details  Name: DAXTON NYDAM MRN: [id number] Date of Birth: [birth year]  Transition [facility] Care [facility]) CM/SW Contact  Boneta Lucks, RN Phone Number: 10/31/2021, 1:18 PM  Clinical Narrative:   Patient is now comfort care. [facility] hosp[facility] accepted patient and admission is complete.They have no beds. Updated MD/RN to flip chart to GIP.  Expected Discharge Plan: Hospice Medical Facility Barriers to Discharge: Other (must enter comment), Hospice Bed not available (GIP)  Expected Discharge Plan and Services Expected Discharge Plan: [place]    Expected Discharge Date: 10/30/21                   HH Arranged: RN, PT, Nurse's Aide [facility] Agency: [place] Date [facility] Agency Contacted: 10/26/21 Time [place]: [phone]    Readmission Risk Interventions No flowsheet data found.

## 2021-10-31 NOTE — Therapy (Signed)
[facility] [address] [place], [place], [address] Phone: [phone]   Fax:  [phone]  Patient Details  Name: Lawrence Rogers MRN: [id number] Date of Birth: 12/28/1960 Referring Provider:  No ref. provider found  Encounter Date: 10/31/2021  OCCUPATIONAL THERAPY DISCHARGE SUMMARY  Visits from Start of Care: 18  Current functional level related to goals / functional outcomes: Due to change in medical status and recent decline, patient will be discharged from outpatient OT services at this time. Unable to participate in therapy.    Remaining deficits: Decreased strength, ROM, and increased pain.    Education / Equipment: HEP for shoulder   Patient agrees to discharge. Patient goals were partially met. Patient is being discharged due to a change in medical status..     Laura Essenmacher, OTR/L,CBIS  [phone]  10/31/2021, 9:17 AM  [facility] [address] [place], [place], [address] Phone: [phone]   Fax:  [phone] 

## 2021-10-31 NOTE — H&P (Addendum)
HISTORY AND PHYSICAL  GIP     Brief Admission History:  Lawrence Rogers is a 60 y.o. 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  At presentation to ED, patient reported weakness, denies syncope, loss of consciousness or dizziness. CT abdomen and pelvis showed no posttraumatic deformities. Patient was fluid overloaded with ascites.  Labs at that time showed a leukocytosis 137.1, concerning for malignancy.  Patient MRI was concerning for hepatocellular carcinoma.  Both GI and oncology has been consulted.  Patient is status post large-volume paracentesis of 3.6 L on 11/8. Fluid analysis consistent with SBP, gram stain with gram-positive cocci in clusters; Continue IV antibiotics.  Imaging for falls showed posttraumatic injuries.  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  Subsequent MRI showed 9.5 cm infiltrating enhancing mass.  Palliative care has seen patient; patient now DNR.  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 After palliative discussions decision was made for residential hospice and full comfort care, no further paracentesis or pursuit of pleuryx drain.      Subjective: Pt with no specific complaints. He is agreeable to hospice.     Discharge Exam:     Vitals:    10/29/21 2100 10/30/21 0520  BP: (!) 108/47 93/65  Pulse: (!) 109 (!) 101  Resp: 20 18  Temp: 98.8 F (37.1 C) 97.6 F (36.4 C)  SpO2: 93% 95%          Vitals:    10/29/21 1459 10/29/21 1634 10/29/21 2100 10/30/21 0520  BP:     (!) 108/47 93/65  Pulse: (!) 107 (!) 105 (!) 109 (!) 101  Resp:     20 18  Temp:     98.8 F (37.1 C) 97.6 F (36.4 C)  TempSrc:     Oral Oral  SpO2: 94% 94% 93% 95%  Weight:       99.7 kg  Height:            General exam: Appears calm and comfortable.  Appears very ill, appears jaundiced,  appears older than stated age.  Respiratory system: Clear to auscultation. Respiratory effort normal. Cardiovascular system: normal S1 & S2 heard. No JVD, murmurs, rubs, gallops or clicks.  +3 edema bilateral feet and legs  gastrointestinal system: Abdomen extended, nontender Central nervous system: Alert and oriented. No focal neurological deficits. Skin: Jaundice  psychiatry: Judgement and insight appear normal. Mood & affect appropriate.   Assessment and Plan  Hepatocellular Carcinoma - Pt decided to pursue residential hospice placement.    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  - difficult choices ahead - if we can't secure 24/7 supervision/care for him at home, he will need other arrangements; best scenario would be for him to go to residential hospice.  Awaiting for [facility] hospice to come and screen him. -11/13: pt is agreeable to going to residential hospice.   -11/15: pt was seen and accepted to virtual hospice bed "GIP"

## 2021-10-31 NOTE — Progress Notes (Signed)
Pt continues to remain comfortable. Pain medication given as needed. Alert and oriented and able to make needs knwn.

## 2021-10-31 NOTE — Progress Notes (Signed)
Nutrition Brief Note  Chart reviewed.  Pt transitioning to comfort care. Residential Hospice placement.  No further nutrition interventions planned at this time.  Please re-consult as needed.   Colman Cater MS,RD,CSG,LDN Contact: Shea Evans

## 2021-10-31 NOTE — Progress Notes (Signed)
Palliative: Chart review completed.  Conference with bedside nursing staff.  Conference with hospice liaison.  Mr. Jacober has been accepted at residential hospice with [facility].  Transition of care team working to liaison.  Conference with attending, bedside nursing staff, transition of care team related to patient condition, needs, disposition.  Plan: Comfort and dignity at end-of-life, residential hospice at [facility], [place]. Prognosis: 3 weeks or less would not be surprising if no further paracentesis.  No charge [name], NP Palliative medicine team Team phone [phone] Greater than 50% of this time was spent counseling and coordinating care related to the above assessment and plan.

## 2021-11-01 ENCOUNTER — Encounter (HOSPITAL_COMMUNITY): Payer: Managed Care, Other (non HMO)

## 2021-11-01 DIAGNOSIS — E43 Unspecified severe protein-calorie malnutrition: Secondary | ICD-10-CM

## 2021-11-01 DIAGNOSIS — R188 Other ascites: Secondary | ICD-10-CM

## 2021-11-01 DIAGNOSIS — E7220 Disorder of urea cycle metabolism, unspecified: Secondary | ICD-10-CM

## 2021-11-01 DIAGNOSIS — K7460 Unspecified cirrhosis of liver: Principal | ICD-10-CM

## 2021-11-01 LAB — RESP PANEL BY RT-PCR (FLU A&B, COVID) ARPGX2
Influenza A by PCR: NEGATIVE
Influenza B by PCR: NEGATIVE
SARS Coronavirus 2 by RT PCR: NEGATIVE

## 2021-11-01 LAB — CULTURE, BODY FLUID W GRAM STAIN -BOTTLE: Culture: NO GROWTH

## 2021-11-01 MED ORDER — TRAMADOL HCL 50 MG PO TABS
50.0000 mg | ORAL_TABLET | Freq: Four times a day (QID) | ORAL | Status: DC | PRN
  Administered 2021-11-01 (×2): 50 mg via ORAL
  Filled 2021-11-01 (×2): qty 1

## 2021-11-01 NOTE — TOC Transition Note (Signed)
Transition of Care [facility]) - CM/SW Discharge Note   Patient Details  Name: Lawrence Rogers MRN: [id number] Date of Birth: October 17, 1961  Transition of Care [facility]) CM/SW Contact:  Boneta Lucks, RN Phone Number: 11/01/2021, 2:31 PM   Clinical Narrative:   [facility] has a bed. COVID test is neg today. RN called report. Friend on chart updated. Medical necessity printed and EMS scheduled.   Final next level of care: [facility] Barriers to Discharge: Barriers Resolved   Patient Goals and CMS Choice     [facility] Discharge Placement              Patient to be transferred to facility by: EMS Name of family member notified: Alyse Low - Friend Patient and family notified of of transfer: 11/01/21

## 2021-11-01 NOTE — Discharge Summary (Signed)
Physician Discharge Summary  Lawrence Rogers JXB:[id number] DOB: 12/07/1961 DOA: 10/31/2021  PCP: [name], MD  Admit date: 10/31/2021 Discharge date: 11/01/2021  Admitted From: home Disposition:  Residential hospice    Discharge Condition: Stable CODE STATUS:FULL COMFORT   Brief/Interim Summary: 60 y.o. 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  At presentation to ED, patient reported weakness, denies syncope, loss of consciousness or dizziness. CT abdomen and pelvis showed no posttraumatic deformities. Patient was fluid overloaded with ascites.  Labs at that time showed a leukocytosis 137.1, concerning for malignancy.  Patient MRI was concerning for hepatocellular carcinoma.  Both GI and oncology has been consulted.  EGD on 10/25/2021-hypopharynx and proximal esophagus normal.  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  5 bands were successfully placed with incomplete eradication of varices.  There was no bleeding during and at the end of the procedure.  Moderate portal hypertensive gastropathy found in the gastric fundus and body.  Multiple 4 to 9 mm apparently did not sessile polyps with no bleeding and no stigmata of recent bleeding.  Duodenum bulb and second portion normal Patient is status post large-volume paracentesis of 3.6 L on 11/8. Fluid analysis consistent with SBP, gram stain with gram-positive cocci in clusters; Continue IV antibiotics.  He finished 5 day course.  Imaging for falls showed posttraumatic injuries.  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  Subsequent MRI showed 9.5 cm infiltrating enhancing mass.  Palliative care has seen patient; patient now DNR.  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    Discharge Diagnoses:  Decompensated cirrhosis  with SBP -Significant volume overload with noted noncompliance with diuretics at presentation. -S/p paracentesis on 11/7, per GI performed with over 3 L removed. -fluid analysis consistent with SBP (355 PMNs); gram stain with gram-positive cocci in clusters. -Cefepime, vancomycin discontinued -Zosyn was added to patient's regimen.  On 11/13 started daily prophylaxis ciprofloxacin 500 mg daily.   - IV albumin given by GI service thru 11/13.  -GI team resuming diuretics: Bumex 1 mg started 11/11 -AKI improved; continue spironolactone.   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  Subsequent MRI showed 9.5 cm infiltrating enhancing mass.  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 If that improves to A/B he may be able to have chemo therapy.   -Per 11/9 oncology note, patient likely available for transplant: Patient will be offered systemic therapy [facility] -Repeat Paracentesis 11/11 with Dr. Thornton Papas for therapeutic purposes. Pt tolerated with good results.      Hepatocellular Carcinoma - Pt decided to pursue residential hospice placement.    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  -palliative medicine was consulted and GOC was discussed - difficult choices ahead - if we can't secure 24/7 supervision/care for him at home, he will need other arrangements; best scenario would be for him to go to residential hospice.  Awaiting for [facility] hospice to come and screen him. -11/13: pt is agreeable to going to residential hospice.     Transaminitis secondary to  above -MRI concerning for hepatocellular carcinoma -Labs stable/slightly elevated AST 65>>76>>74, alk phos 386>>418>>437, total bilirubin  5.4>>6.2>>7.5 -no further lab draws as plan is to get him to residential hospice   Progressive weakness/debility with fall-multifactorial -No acute injuries noted -residential hospice placement in progress -Fall precautions   Leukocytosis - Leukemoid reaction  -Oncology has been consulted; believe leukocytosis is secondary to SBP. -WBC increasing; 86.6 >> 101.0 -Continue antibiotics as stated above -repeat CXR with no signs of infection found. -stop further blood draws and focus on residential hospice placement.  Vitals once daily only for comfort.    Lactic acidosis - resolved now -Likely in the setting of hematological malignancy and not likely sepsis physiology   Hyponatremia/AKI -Likely in the setting of prerenal causes with suspicion of hepatorenal syndrome -IV albumin as ordered per GI to complete 11/13.  -Avoid nephrotoxic agents -Spironolactone as stated above -Bumex 1 mg daily started by GI team.  -Secondary to volume overload.       Discharge Instructions   Allergies as of 11/01/2021   No Known Allergies      Medication List     STOP taking these medications    bumetanide 1 MG tablet Commonly known as: BUMEX   ciprofloxacin 500 MG tablet Commonly known as: CIPRO   feeding supplement Liqd   lactulose 10 GM/15ML solution Commonly known as: CHRONULAC   pantoprazole 40 MG tablet Commonly known as: PROTONIX   spironolactone 50 MG tablet Commonly known as: ALDACTONE        No Known Allergies  Consultations: GI Heme/onc palliative   Procedures/Studies: CT ABDOMEN PELVIS WO CONTRAST  Result Date: 10/23/2021 CLINICAL DATA:  Abdominal distension. Fall. Low back pain. Cirrhosis. Diabetes. EXAM: CT ABDOMEN AND PELVIS WITHOUT CONTRAST TECHNIQUE: Multidetector CT imaging of the abdomen and pelvis was performed following the standard protocol without IV contrast. COMPARISON:  05/20/2020 abdominal ultrasound. 10/07/2018 abdominopelvic CT. FINDINGS:  Lower chest: right greater than left dependent lower lobe airspace disease. A 3 mm lingular nodule on 06/05 is not readily apparent on the prior. Small right pleural effusion is new. Normal heart size. Bilateral gynecomastia. Marked periesophageal varices. Hepatobiliary: Subtle hypoattenuating right hepatic lobe mass including at 8.2 x 8.8 cm, suboptimally evaluated on this noncontrast CT. Example 19/3. Likely separate more inferior right hepatic lobe hypoattenuating lesion of 2.2 cm on 30/3. Normal gallbladder, without biliary ductal dilatation. Pancreas: Pancreatic atrophy, without duct dilatation. Spleen: Splenomegaly, including at 18.4 cm craniocaudal. Adrenals/Urinary Tract: Normal adrenal glands. Bilateral renal collecting system calculi, maximally 1.0 cm in the interpolar left kidney. No hydronephrosis. No hydroureter or ureteric calculi. No bladder calculi. Stomach/Bowel: The gastric body is underdistended but appears thick walled on 22/3. The colon is underdistended and poorly evaluated. Normal small bowel caliber. Vascular/Lymphatic: Aortic atherosclerosis. Extensive perigastric varices. Small abdominal retroperitoneal nodes are likely reactive in the setting of cirrhosis. Index precaval 10 mm node on 38/3 is similar. No pelvic sidewall adenopathy. Reproductive: Normal prostate. Other: Moderate volume abdominopelvic ascites. No free intraperitoneal air. Musculoskeletal: No acute osseous abnormality. IMPRESSION: 1. No posttraumatic deformity identified. 2. Cirrhosis and portal venous hypertension with moderate volume ascites. Hypoattenuating liver lesions, suspicious for multifocal hepatocellular carcinoma. When the patient is clinically stable and able to follow directions and hold their breath (preferably as an outpatient) further evaluation with dedicated abdominal MRI should be considered. 3. Right pleural effusion with bibasilar airspace disease. Most likely atelectasis. At the right lung base,  infection cannot be excluded. 4. Bilateral nephrolithiasis. 5. Gastric wall thickening  in the setting of underdistention. Recommend clinical exclusion of gastritis. Hypoalbuminemia could create a similar appearance. 6. 3 mm lingular nodule. No follow-up needed if patient is low-risk. Non-contrast chest CT can be considered in 12 months if patient is high-risk. This recommendation follows the consensus statement: Guidelines for Management of Incidental Pulmonary Nodules Detected on CT Images: From the Fleischner Society 2017; Radiology 2017; 284:228-243. 7. Bilateral gynecomastia. These results were called by telephone at the time of interpretation on 10/23/2021 at 12:37 pm to provider [facility] ZAMMIT , who verbally acknowledged these results. Electronically Signed   By: Abigail Miyamoto M.D.   On: 10/23/2021 12:38   MR LIVER W WO CONTRAST  Result Date: 10/24/2021 CLINICAL DATA:  Cirrhosis, new liver lesions on CT, occlusion of main and intrahepatic portal veins EXAM: MRI ABDOMEN WITHOUT AND WITH CONTRAST TECHNIQUE: Multiplanar multisequence MR imaging of the abdomen was performed both before and after the administration of intravenous contrast. CONTRAST:  9m GADAVIST GADOBUTROL 1 MMOL/ML IV SOLN COMPARISON:  CT abdomen/pelvis dated 10/23/2021 FINDINGS: Motion degraded images. Lower chest: Moderate right and small left pleural effusions. Associated bilateral lower lobe opacities, likely atelectasis. Hepatobiliary: Cirrhosis. 9.5 cm infiltrating enhancing mass in the posterior right hepatic dome, centered in segment 7 (series 6/image 15), compatible with hepatocellular carcinoma (LI-RADS 5). Additional 2.1 cm enhancing lesion in segment 5 (series 14/image 51), also compatible with HCC (LI-RADS 5). Gallbladder is underdistended with mild gallbladder wall thickening/edema, likely secondary to venous congestion/ascites. No cholelithiasis. No intrahepatic or extrahepatic ductal dilatation. Pancreas:  Within normal limits.  Spleen:  Enlarged, measuring 20.2 cm in craniocaudal dimension. Adrenals/Urinary Tract:  Adrenal glands are within normal limits. 1.6 cm cyst with layering hemorrhage in the posterior right upper kidney (series 4/image 30), benign (Bosniak II). Left kidney is within normal limits. No hydronephrosis. Stomach/Bowel: Stomach is within normal limits. Visualized bowel is grossly unremarkable. Vascular/Lymphatic:  No evidence of abdominal aortic aneurysm. Main portal vein thrombosis (series 18/image 45), extending into the left and right portal veins (series 201/images 34 and 42). No associated findings to suggest tumor thrombus. Gastroesophageal varices. Other: Small to moderate abdominal ascites, predominantly perihepatic. Musculoskeletal: No focal osseous lesions. 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 No associated findings to suggest tumor thrombus. Cirrhosis. Splenomegaly. Gastroesophageal varices. Small to moderate abdominal ascites. Moderate right and small left pleural effusions. Electronically Signed   By: SJulian HyM.D.   On: 10/24/2021 20:27   [place]  Result Date: 10/27/2021 INDICATION: Cirrhosis, ascites EXAM: ULTRASOUND GUIDED DIAGNOSTIC AND THERAPEUTIC PARACENTESIS MEDICATIONS: None. COMPLICATIONS: None immediate. PROCEDURE: Informed written consent was obtained from the patient after a discussion of the risks, benefits and alternatives to treatment. A timeout was performed prior to the initiation of the procedure. Initial ultrasound scanning demonstrates a large amount of ascites within the right lower abdominal quadrant. The right lower abdomen was prepped and draped in the usual sterile fashion. 1% lidocaine was used for local anesthesia. Following this, a 5 [place] catheter was introduced. An ultrasound image was saved for documentation  purposes. The paracentesis was performed. The catheter was removed and a dressing was applied. The patient tolerated the procedure well without immediate post procedural complication. FINDINGS: A total of approximately 3.8 L of yellow ascitic fluid was removed. Specimens were sent to laboratory for requested analysis. IMPRESSION: Successful ultrasound-guided paracentesis yielding 3.8 liters of peritoneal fluid. Electronically Signed  By: Lavonia Dana M.D.   On: 10/27/2021 13:41   US Paracentesis  Result Date: 10/23/2021 INDICATION: Cirrhosis, ascites EXAM: ULTRASOUND GUIDED DIAGNOSTIC AND THERAPEUTIC PARACENTESIS MEDICATIONS: None. COMPLICATIONS: None immediate. PROCEDURE: Informed written consent was obtained from the patient after a discussion of the risks, benefits and alternatives to treatment. A timeout was performed prior to the initiation of the procedure. Initial ultrasound scanning demonstrates a large amount of ascites within the LEFT lower abdominal quadrant. The right lower abdomen was prepped and draped in the usual sterile fashion. 1% lidocaine was used for local anesthesia. Following this, a 5 [place] Yueh catheter was introduced. An ultrasound image was saved for documentation purposes. The paracentesis was performed. The catheter was removed and a dressing was applied. The patient tolerated the procedure well without immediate post procedural complication. FINDINGS: A total of approximately 3.6 L of yellow ascitic fluid was removed. Samples were sent to the laboratory as requested by the clinical team. IMPRESSION: Successful ultrasound-guided paracentesis yielding 3.6 liters of peritoneal fluid. Electronically Signed   By: Lavonia Dana M.D.   On: 10/23/2021 15:00   DG CHEST PORT 1 VIEW  Result Date: 10/26/2021 CLINICAL DATA:  Leukocytosis.  Cough and short of breath EXAM: PORTABLE CHEST 1 VIEW COMPARISON:  10/23/2021 FINDINGS: Right lower left airspace disease and right pleural effusion  unchanged from the prior study. Small left effusion unchanged. Heart size and vascularity normal.  Negative for heart failure. IMPRESSION: No significant interval change. Right lower lobe airspace disease and right pleural effusion unchanged Electronically Signed   By: Franchot Gallo M.D.   On: 10/26/2021 14:11   DG Chest Port 1 View  Result Date: 10/23/2021 CLINICAL DATA:  Weakness EXAM: PORTABLE CHEST 1 VIEW COMPARISON:  Chest x-ray 09/22/2021 FINDINGS: Heart size is upper normal. Mediastinum appears stable. Mild calcified plaques in the aortic arch. Mild pulmonary vascular/interstitial prominence. Small left and moderate right pleural effusion with associated atelectasis/infiltrate. No pneumothorax. IMPRESSION: Bilateral pleural effusions right greater than left. Mild pulmonary vascular prominence. Electronically Signed   By: Ofilia Neas M.D.   On: 10/23/2021 11:56   [place] ASCITES (ABDOMEN LIMITED)  Result Date: 10/30/2021 CLINICAL DATA:  60 year old male with history of ascites. EXAM: LIMITED ABDOMEN ULTRASOUND FOR ASCITES TECHNIQUE: Limited ultrasound survey for ascites was performed in all four abdominal quadrants. COMPARISON:  Multiple priors, most recently 10/27/2021. FINDINGS: Small volume of ascites noted predominantly in the right-side of the abdomen, much less than prior examinations and insufficient to safely perform paracentesis for therapeutic purposes. IMPRESSION: 1. Small volume of ascites. Electronically Signed   By: Vinnie Langton M.D.   On: 10/30/2021 10:24   US LIVER DOPPLER  Result Date: 10/24/2021 CLINICAL DATA:  60 year old male with a history of hepatic cirrhosis and recent imaging findings concerning for hepatocellular carcinoma and portal vein occlusion. EXAM: DUPLEX ULTRASOUND OF LIVER TECHNIQUE: Color and duplex Doppler ultrasound was performed to evaluate the hepatic in-flow and out-flow vessels. COMPARISON:  CT abdomen/pelvis 10/23/2021 FINDINGS: Liver: Markedly  nodular hepatic contour. Diffuse heterogeneity of the hepatic parenchyma. Discrete hepatic lesions are difficult to visualize given the heterogeneity of the background parenchyma. No biliary ductal dilatation. 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 Heterogeneously echogenic material is visualized within the vessel lumen. Hepatic Vein Velocities Right:  42 cm/sec Middle:  45 cm/sec Left:  43 cm/sec IVC: Questionable thrombus visualized within the IVC. Hepatic Artery Velocity:  46 cm/sec Splenic Vein Velocity: Occluded Spleen: 18.1 cm  x 7.3 cm x 19.6 cm with a total volume of 1,352 cm^3 (411 cm^3 is upper limit normal) Portal Vein Occlusion/Thrombus: Yes Splenic Vein Occlusion/Thrombus: Yes Ascites: Present Varices: Present IMPRESSION: 1. 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 It is unclear if this represents bland thrombus or tumor thrombus. 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 2. Questionable thrombus within the inferior vena cava. If truly representative of thrombus, this would almost certainly be tumor thrombus extending through the hepatic veins into the IVC. 3. Small cirrhotic liver with heavily nodular contour. 4. Although suspected on CT imaging, discrete hepatic mass and lesions not visualized likely due to the severe heterogeneity of the background parenchyma. 5. Ascites. Recommend further evaluation with gadolinium-enhanced MRI of the abdomen to assess for hepatocellular carcinoma and tumor thrombus within the IVC and portal veins. Electronically Signed   By: Jacqulynn Cadet M.D.   On: 10/24/2021 13:17        Discharge Exam: Vitals:   10/31/21 2221 11/01/21 0700  BP: (!) 120/44 (!) 116/50  Pulse: (!) 110 (!) 101  Resp: 20 19  Temp: 99.3 F (37.4 C) 97.9 F (36.6 C)  SpO2: 94% 96%   Vitals:   10/31/21 2221 11/01/21 0700  BP: (!) 120/44 (!) 116/50  Pulse: (!) 110 (!) 101  Resp: 20 19  Temp: 99.3 F (37.4 C) 97.9 F (36.6  C)  TempSrc: Oral Oral  SpO2: 94% 96%  Weight:  101.2 kg    General: Pt is alert, awake, not in acute distress Cardiovascular: RRR, S1/S2 +, no rubs, no gallops Respiratory: bibasilar crackles. No wheeze Abdominal: Soft, NT, ND, bowel sounds + Extremities: 3 + LE edema, no cyanosis   The results of significant diagnostics from this hospitalization (including imaging, microbiology, ancillary and laboratory) are listed below for reference.    Significant Diagnostic Studies: CT ABDOMEN PELVIS WO CONTRAST  Result Date: 10/23/2021 CLINICAL DATA:  Abdominal distension. Fall. Low back pain. Cirrhosis. Diabetes. EXAM: CT ABDOMEN AND PELVIS WITHOUT CONTRAST TECHNIQUE: Multidetector CT imaging of the abdomen and pelvis was performed following the standard protocol without IV contrast. COMPARISON:  05/20/2020 abdominal ultrasound. 10/07/2018 abdominopelvic CT. FINDINGS: Lower chest: right greater than left dependent lower lobe airspace disease. A 3 mm lingular nodule on 06/05 is not readily apparent on the prior. Small right pleural effusion is new. Normal heart size. Bilateral gynecomastia. Marked periesophageal varices. Hepatobiliary: Subtle hypoattenuating right hepatic lobe mass including at 8.2 x 8.8 cm, suboptimally evaluated on this noncontrast CT. Example 19/3. Likely separate more inferior right hepatic lobe hypoattenuating lesion of 2.2 cm on 30/3. Normal gallbladder, without biliary ductal dilatation. Pancreas: Pancreatic atrophy, without duct dilatation. Spleen: Splenomegaly, including at 18.4 cm craniocaudal. Adrenals/Urinary Tract: Normal adrenal glands. Bilateral renal collecting system calculi, maximally 1.0 cm in the interpolar left kidney. No hydronephrosis. No hydroureter or ureteric calculi. No bladder calculi. Stomach/Bowel: The gastric body is underdistended but appears thick walled on 22/3. The colon is underdistended and poorly evaluated. Normal small bowel caliber.  Vascular/Lymphatic: Aortic atherosclerosis. Extensive perigastric varices. Small abdominal retroperitoneal nodes are likely reactive in the setting of cirrhosis. Index precaval 10 mm node on 38/3 is similar. No pelvic sidewall adenopathy. Reproductive: Normal prostate. Other: Moderate volume abdominopelvic ascites. No free intraperitoneal air. Musculoskeletal: No acute osseous abnormality. IMPRESSION: 1. No posttraumatic deformity identified. 2. Cirrhosis and portal venous hypertension with moderate volume ascites. Hypoattenuating liver lesions, suspicious for multifocal hepatocellular carcinoma. When the patient is clinically stable and able to follow directions and hold  their breath (preferably as an outpatient) further evaluation with dedicated abdominal MRI should be considered. 3. Right pleural effusion with bibasilar airspace disease. Most likely atelectasis. At the right lung base, infection cannot be excluded. 4. Bilateral nephrolithiasis. 5. Gastric wall thickening in the setting of underdistention. Recommend clinical exclusion of gastritis. Hypoalbuminemia could create a similar appearance. 6. 3 mm lingular nodule. No follow-up needed if patient is low-risk. Non-contrast chest CT can be considered in 12 months if patient is high-risk. This recommendation follows the consensus statement: Guidelines for Management of Incidental Pulmonary Nodules Detected on CT Images: From the Fleischner Society 2017; Radiology 2017; 284:228-243. 7. Bilateral gynecomastia. These results were called by telephone at the time of interpretation on 10/23/2021 at 12:37 pm to provider [facility] ZAMMIT , who verbally acknowledged these results. Electronically Signed   By: Abigail Miyamoto M.D.   On: 10/23/2021 12:38   MR LIVER W WO CONTRAST  Result Date: 10/24/2021 CLINICAL DATA:  Cirrhosis, new liver lesions on CT, occlusion of main and intrahepatic portal veins EXAM: MRI ABDOMEN WITHOUT AND WITH CONTRAST TECHNIQUE: Multiplanar  multisequence MR imaging of the abdomen was performed both before and after the administration of intravenous contrast. CONTRAST:  72m GADAVIST GADOBUTROL 1 MMOL/ML IV SOLN COMPARISON:  CT abdomen/pelvis dated 10/23/2021 FINDINGS: Motion degraded images. Lower chest: Moderate right and small left pleural effusions. Associated bilateral lower lobe opacities, likely atelectasis. Hepatobiliary: Cirrhosis. 9.5 cm infiltrating enhancing mass in the posterior right hepatic dome, centered in segment 7 (series 6/image 15), compatible with hepatocellular carcinoma (LI-RADS 5). Additional 2.1 cm enhancing lesion in segment 5 (series 14/image 51), also compatible with HCC (LI-RADS 5). Gallbladder is underdistended with mild gallbladder wall thickening/edema, likely secondary to venous congestion/ascites. No cholelithiasis. No intrahepatic or extrahepatic ductal dilatation. Pancreas:  Within normal limits. Spleen:  Enlarged, measuring 20.2 cm in craniocaudal dimension. Adrenals/Urinary Tract:  Adrenal glands are within normal limits. 1.6 cm cyst with layering hemorrhage in the posterior right upper kidney (series 4/image 30), benign (Bosniak II). Left kidney is within normal limits. No hydronephrosis. Stomach/Bowel: Stomach is within normal limits. Visualized bowel is grossly unremarkable. Vascular/Lymphatic:  No evidence of abdominal aortic aneurysm. Main portal vein thrombosis (series 18/image 45), extending into the left and right portal veins (series 201/images 34 and 42). No associated findings to suggest tumor thrombus. Gastroesophageal varices. Other: Small to moderate abdominal ascites, predominantly perihepatic. Musculoskeletal: No focal osseous lesions. 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 No associated findings to suggest tumor thrombus. Cirrhosis. Splenomegaly.  Gastroesophageal varices. Small to moderate abdominal ascites. Moderate right and small left pleural effusions. Electronically Signed   By: SJulian HyM.D.   On: 10/24/2021 20:27   [place]  Result Date: 10/27/2021 INDICATION: Cirrhosis, ascites EXAM: ULTRASOUND GUIDED DIAGNOSTIC AND THERAPEUTIC PARACENTESIS MEDICATIONS: None. COMPLICATIONS: None immediate. PROCEDURE: Informed written consent was obtained from the patient after a discussion of the risks, benefits and alternatives to treatment. A timeout was performed prior to the initiation of the procedure. Initial ultrasound scanning demonstrates a large amount of ascites within the right lower abdominal quadrant. The right lower abdomen was prepped and draped in the usual sterile fashion. 1% lidocaine was used for local anesthesia. Following this, a 5 [place] catheter was introduced. An ultrasound image was saved for documentation purposes. The paracentesis was performed. The catheter was removed and a dressing was applied. The patient  tolerated the procedure well without immediate post procedural complication. FINDINGS: A total of approximately 3.8 L of yellow ascitic fluid was removed. Specimens were sent to laboratory for requested analysis. IMPRESSION: Successful ultrasound-guided paracentesis yielding 3.8 liters of peritoneal fluid. Electronically Signed   By: Lavonia Dana M.D.   On: 10/27/2021 13:41   US Paracentesis  Result Date: 10/23/2021 INDICATION: Cirrhosis, ascites EXAM: ULTRASOUND GUIDED DIAGNOSTIC AND THERAPEUTIC PARACENTESIS MEDICATIONS: None. COMPLICATIONS: None immediate. PROCEDURE: Informed written consent was obtained from the patient after a discussion of the risks, benefits and alternatives to treatment. A timeout was performed prior to the initiation of the procedure. Initial ultrasound scanning demonstrates a large amount of ascites within the LEFT lower abdominal quadrant. The right lower abdomen was prepped and  draped in the usual sterile fashion. 1% lidocaine was used for local anesthesia. Following this, a 5 [place] Yueh catheter was introduced. An ultrasound image was saved for documentation purposes. The paracentesis was performed. The catheter was removed and a dressing was applied. The patient tolerated the procedure well without immediate post procedural complication. FINDINGS: A total of approximately 3.6 L of yellow ascitic fluid was removed. Samples were sent to the laboratory as requested by the clinical team. IMPRESSION: Successful ultrasound-guided paracentesis yielding 3.6 liters of peritoneal fluid. Electronically Signed   By: Lavonia Dana M.D.   On: 10/23/2021 15:00   DG CHEST PORT 1 VIEW  Result Date: 10/26/2021 CLINICAL DATA:  Leukocytosis.  Cough and short of breath EXAM: PORTABLE CHEST 1 VIEW COMPARISON:  10/23/2021 FINDINGS: Right lower left airspace disease and right pleural effusion unchanged from the prior study. Small left effusion unchanged. Heart size and vascularity normal.  Negative for heart failure. IMPRESSION: No significant interval change. Right lower lobe airspace disease and right pleural effusion unchanged Electronically Signed   By: Franchot Gallo M.D.   On: 10/26/2021 14:11   DG Chest Port 1 View  Result Date: 10/23/2021 CLINICAL DATA:  Weakness EXAM: PORTABLE CHEST 1 VIEW COMPARISON:  Chest x-ray 09/22/2021 FINDINGS: Heart size is upper normal. Mediastinum appears stable. Mild calcified plaques in the aortic arch. Mild pulmonary vascular/interstitial prominence. Small left and moderate right pleural effusion with associated atelectasis/infiltrate. No pneumothorax. IMPRESSION: Bilateral pleural effusions right greater than left. Mild pulmonary vascular prominence. Electronically Signed   By: Ofilia Neas M.D.   On: 10/23/2021 11:56   [place] ASCITES (ABDOMEN LIMITED)  Result Date: 10/30/2021 CLINICAL DATA:  60 year old male with history of ascites. EXAM: LIMITED ABDOMEN  ULTRASOUND FOR ASCITES TECHNIQUE: Limited ultrasound survey for ascites was performed in all four abdominal quadrants. COMPARISON:  Multiple priors, most recently 10/27/2021. FINDINGS: Small volume of ascites noted predominantly in the right-side of the abdomen, much less than prior examinations and insufficient to safely perform paracentesis for therapeutic purposes. IMPRESSION: 1. Small volume of ascites. Electronically Signed   By: Vinnie Langton M.D.   On: 10/30/2021 10:24   US LIVER DOPPLER  Result Date: 10/24/2021 CLINICAL DATA:  60 year old male with a history of hepatic cirrhosis and recent imaging findings concerning for hepatocellular carcinoma and portal vein occlusion. EXAM: DUPLEX ULTRASOUND OF LIVER TECHNIQUE: Color and duplex Doppler ultrasound was performed to evaluate the hepatic in-flow and out-flow vessels. COMPARISON:  CT abdomen/pelvis 10/23/2021 FINDINGS: Liver: Markedly nodular hepatic contour. Diffuse heterogeneity of the hepatic parenchyma. Discrete hepatic lesions are difficult to visualize given the heterogeneity of the background parenchyma. No biliary ductal dilatation. 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  Heterogeneously echogenic material is visualized within the vessel lumen. Hepatic Vein Velocities Right:  42 cm/sec Middle:  45 cm/sec Left:  43 cm/sec IVC: Questionable thrombus visualized within the IVC. Hepatic Artery Velocity:  46 cm/sec Splenic Vein Velocity: Occluded Spleen: 18.1 cm x 7.3 cm x 19.6 cm with a total volume of 1,352 cm^3 (411 cm^3 is upper limit normal) Portal Vein Occlusion/Thrombus: Yes Splenic Vein Occlusion/Thrombus: Yes Ascites: Present Varices: Present IMPRESSION: 1. 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 It is unclear if this represents bland thrombus or tumor thrombus. 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 2. Questionable thrombus within the inferior vena cava. If truly representative of  thrombus, this would almost certainly be tumor thrombus extending through the hepatic veins into the IVC. 3. Small cirrhotic liver with heavily nodular contour. 4. Although suspected on CT imaging, discrete hepatic mass and lesions not visualized likely due to the severe heterogeneity of the background parenchyma. 5. Ascites. Recommend further evaluation with gadolinium-enhanced MRI of the abdomen to assess for hepatocellular carcinoma and tumor thrombus within the IVC and portal veins. Electronically Signed   By: Jacqulynn Cadet M.D.   On: 10/24/2021 13:17    Microbiology: Recent Results (from the past 240 hour(s))  Culture, blood (single)     Status: None   Collection Time: 10/23/21 12:52 PM   Specimen: Right Antecubital; Blood  Result Value Ref Range Status   Specimen Description   Final    RIGHT ANTECUBITAL Performed at [facility], [address] [place], [place] [address]    Special Requests   Final    BOTTLES DRAWN AEROBIC AND ANAEROBIC Blood Culture adequate volume Performed at [facility] [place], [place] [address]    Culture  Setup Time   Final    AEB AND ANA BOTTLES Gram Stain Report Called to,Read Back By and Verified With: Coralie Common RN [phone] K FORSYTH CORRECTED RESULTS NO ORGANISMS SEEN PREVIOUSLY REPORTED AS: GRAM POSITIVE COCCI IN CLUSTERS CORRECTED RESULTS CALLED TO: PHARMD S.HURTH AT [facility] ON 10/25/2021 BY T.SAAD. Performed at [facility], [place] [address], [place], [place] [address]    Culture   Final    NO GROWTH 4 DAYS Performed at [facility], [address] [place],  [address]    Report Status 10/27/2021 FINAL  Final  Blood Culture ID Panel (Reflexed)     Status: None   Collection Time: 10/23/21 12:52 PM  Result Value Ref Range Status   Enterococcus faecalis NOT DETECTED NOT DETECTED Final   Enterococcus Faecium NOT DETECTED NOT DETECTED Final   Listeria monocytogenes NOT DETECTED NOT DETECTED Final   Staphylococcus species  NOT DETECTED NOT DETECTED Final   Staphylococcus aureus (BCID) NOT DETECTED NOT DETECTED Final   Staphylococcus epidermidis NOT DETECTED NOT DETECTED Final   Staphylococcus lugdunensis NOT DETECTED NOT DETECTED Final   Streptococcus species NOT DETECTED NOT DETECTED Final   Streptococcus agalactiae NOT DETECTED NOT DETECTED Final   Streptococcus pneumoniae NOT DETECTED NOT DETECTED Final   Streptococcus pyogenes NOT DETECTED NOT DETECTED Final   A.calcoaceticus-baumannii NOT DETECTED NOT DETECTED Final   Bacteroides fragilis NOT DETECTED NOT DETECTED Final   Enterobacterales NOT DETECTED NOT DETECTED Final   Enterobacter cloacae complex NOT DETECTED NOT DETECTED Final   Escherichia coli NOT DETECTED NOT DETECTED Final   Klebsiella aerogenes NOT DETECTED NOT DETECTED Final   Klebsiella oxytoca NOT DETECTED NOT DETECTED Final   Klebsiella pneumoniae NOT DETECTED NOT DETECTED Final   Proteus  species NOT DETECTED NOT DETECTED Final   Salmonella species NOT DETECTED NOT DETECTED Final   Serratia marcescens NOT DETECTED NOT DETECTED Final   Haemophilus influenzae NOT DETECTED NOT DETECTED Final   Neisseria meningitidis NOT DETECTED NOT DETECTED Final   Pseudomonas aeruginosa NOT DETECTED NOT DETECTED Final   Stenotrophomonas maltophilia NOT DETECTED NOT DETECTED Final   Candida albicans NOT DETECTED NOT DETECTED Final   Candida auris NOT DETECTED NOT DETECTED Final   Candida glabrata NOT DETECTED NOT DETECTED Final   Candida krusei NOT DETECTED NOT DETECTED Final   Candida parapsilosis NOT DETECTED NOT DETECTED Final   Candida tropicalis NOT DETECTED NOT DETECTED Final   Cryptococcus neoformans/gattii NOT DETECTED NOT DETECTED Final    Comment: Performed at [facility], [place] [address] [place], [place] [address]  Culture, blood (single)     Status: None   Collection Time: 10/23/21 12:53 PM   Specimen: BLOOD LEFT HAND  Result Value Ref Range Status   Specimen Description BLOOD LEFT  HAND  Final   Special Requests   Final    BOTTLES DRAWN AEROBIC AND ANAEROBIC Blood Culture results may not be optimal due to an inadequate volume of blood received in culture bottles   Culture   Final    NO GROWTH 5 DAYS Performed at [facility], [address], [place], [place] [address]    Report Status 10/28/2021 FINAL  Final  Acid Fast Smear (AFB)     Status: None   Collection Time: 10/23/21  2:05 PM   Specimen: Peritoneal Cavity; Body Fluid  Result Value Ref Range Status   AFB Specimen Processing Concentration  Final   Acid Fast Smear Negative  Final    Comment: (NOTE) Performed At: [facility] [place], [place] [id number] Rush Farmer MD WG:[id number]    Source (AFB) PERITONEAL  Final    Comment: Performed at [facility], [address], [place], [place] [address]  Culture, body fluid w Gram Stain-bottle     Status: None   Collection Time: 10/23/21  2:05 PM   Specimen: Peritoneal Washings  Result Value Ref Range Status   Specimen Description PERITONEAL  Final   Special Requests   Final    BOTTLES DRAWN AEROBIC AND ANAEROBIC Blood Culture adequate volume   Culture   Final    NO GROWTH 5 DAYS Performed at [facility], [address] [place], [place] [address]    Report Status 10/28/2021 FINAL  Final  Gram stain     Status: None   Collection Time: 10/23/21  2:05 PM   Specimen: Peritoneal Washings  Result Value Ref Range Status   Specimen Description PERITONEAL  Final   Special Requests NONE  Final   Gram Stain   Final    GRAM POSITIVE COCCI IN CLUSTERS CYTOSPIN SMEAR Gram Stain Report Called to,Read Back By and Verified With: Coralie Common RN [phone] K FORSYTH Performed at [facility], [address], [place], [place] [address]    Report Status 10/23/2021 FINAL  Final  Resp Panel by RT-PCR (Flu A&B, Covid) Nasopharyngeal Swab     Status: None   Collection Time: 10/23/21  2:10 PM   Specimen: Nasopharyngeal Swab; Nasopharyngeal(NP) swabs in vial  transport medium  Result Value Ref Range Status   SARS Coronavirus 2 by RT PCR NEGATIVE NEGATIVE Final    Comment: (NOTE) SARS-CoV-2 target nucleic acids are NOT DETECTED.  The SARS-CoV-2 RNA is generally detectable in upper respiratory specimens during  the acute phase of infection. The lowest concentration of SARS-CoV-2 viral copies this assay can detect is 138 copies/mL. A negative result does not preclude SARS-Cov-2 infection and should not be used as the sole basis for treatment or other patient management decisions. A negative result may occur with  improper specimen collection/handling, submission of specimen other than nasopharyngeal swab, presence of viral mutation(s) within the areas targeted by this assay, and inadequate number of viral copies(<138 copies/mL). A negative result must be combined with clinical observations, patient history, and epidemiological information. The expected result is Negative.  Fact Sheet for Patients:  EntrepreneurPulse.com.au  Fact Sheet for Healthcare Providers:  IncredibleEmployment.be  This test is no t yet approved or cleared by the [place] FDA and  has been authorized for detection and/or diagnosis of SARS-CoV-2 by FDA under an Emergency Use Authorization (EUA). This EUA will remain  in effect (meaning this test can be used) for the duration of the COVID-19 declaration under Section 564(b)(1) of the Act, 21 U.S.C.section 360bbb-3(b)(1), unless the authorization is terminated  or revoked sooner.       Influenza A by PCR NEGATIVE NEGATIVE Final   Influenza B by PCR NEGATIVE NEGATIVE Final    Comment: (NOTE) The Xpert Xpress SARS-CoV-2/FLU/RSV plus assay is intended as an aid in the diagnosis of influenza from Nasopharyngeal swab specimens and should not be used as a sole basis for treatment. Nasal washings and aspirates are unacceptable for Xpert Xpress SARS-CoV-2/FLU/RSV testing.  Fact  Sheet for Patients: EntrepreneurPulse.com.au  Fact Sheet for Healthcare Providers: IncredibleEmployment.be  This test is not yet approved or cleared by the [place] FDA and has been authorized for detection and/or diagnosis of SARS-CoV-2 by FDA under an Emergency Use Authorization (EUA). This EUA will remain in effect (meaning this test can be used) for the duration of the COVID-19 declaration under Section 564(b)(1) of the Act, 21 U.S.C. section 360bbb-3(b)(1), unless the authorization is terminated or revoked.  Performed at [facility], [address], [place], [place] [address]   MRSA Next Gen by PCR, Nasal     Status: None   Collection Time: 10/23/21  6:17 PM   Specimen: Nasal Mucosa; Nasal Swab  Result Value Ref Range Status   MRSA by PCR Next Gen NOT DETECTED NOT DETECTED Final    Comment: (NOTE) The GeneXpert MRSA Assay (FDA approved for NASAL specimens only), is one component of a comprehensive MRSA colonization surveillance program. It is not intended to diagnose MRSA infection nor to guide or monitor treatment for MRSA infections. Test performance is not FDA approved in patients less than 99 years old. Performed at [facility], [address], [place], [place] [address]   Gram stain     Status: None   Collection Time: 10/27/21 12:42 PM   Specimen: Peritoneal Washings  Result Value Ref Range Status   Specimen Description PERITONEAL  Final   Special Requests NONE  Final   Gram Stain   Final    WBC PRESENT,BOTH PMN AND MONONUCLEAR CYTOSPIN SMEAR NO ORGANISMS SEEN Performed at [facility], [address], [place], [place] [address]    Report Status 10/27/2021 FINAL  Final  Culture, body fluid w Gram Stain-bottle     Status: None (Preliminary result)   Collection Time: 10/27/21 12:42 PM   Specimen: Peritoneal Washings  Result Value Ref Range Status   Specimen Description PERITONEAL BOTTLES DRAWN AEROBIC AND ANAEROBIC  Final    Special Requests 10CC  Final   Culture  Final    NO GROWTH 4 DAYS Performed at [facility], [address] [place], [place] [address]    Report Status PENDING  Incomplete  Resp Panel by RT-PCR (Flu A&B, Covid) Nasopharyngeal Swab     Status: None   Collection Time: 10/30/21  3:23 PM   Specimen: Nasopharyngeal Swab; Nasopharyngeal(NP) swabs in vial transport medium  Result Value Ref Range Status   SARS Coronavirus 2 by RT PCR NEGATIVE NEGATIVE Final    Comment: (NOTE) SARS-CoV-2 target nucleic acids are NOT DETECTED.  The SARS-CoV-2 RNA is generally detectable in upper respiratory specimens during the acute phase of infection. The lowest concentration of SARS-CoV-2 viral copies this assay can detect is 138 copies/mL. A negative result does not preclude SARS-Cov-2 infection and should not be used as the sole basis for treatment or other patient management decisions. A negative result may occur with  improper specimen collection/handling, submission of specimen other than nasopharyngeal swab, presence of viral mutation(s) within the areas targeted by this assay, and inadequate number of viral copies(<138 copies/mL). A negative result must be combined with clinical observations, patient history, and epidemiological information. The expected result is Negative.  Fact Sheet for Patients:  EntrepreneurPulse.com.au  Fact Sheet for Healthcare Providers:  IncredibleEmployment.be  This test is no t yet approved or cleared by the [place] FDA and  has been authorized for detection and/or diagnosis of SARS-CoV-2 by FDA under an Emergency Use Authorization (EUA). This EUA will remain  in effect (meaning this test can be used) for the duration of the COVID-19 declaration under Section 564(b)(1) of the Act, 21 U.S.C.section 360bbb-3(b)(1), unless the authorization is terminated  or revoked sooner.       Influenza A by PCR NEGATIVE NEGATIVE Final    Influenza B by PCR NEGATIVE NEGATIVE Final    Comment: (NOTE) The Xpert Xpress SARS-CoV-2/FLU/RSV plus assay is intended as an aid in the diagnosis of influenza from Nasopharyngeal swab specimens and should not be used as a sole basis for treatment. Nasal washings and aspirates are unacceptable for Xpert Xpress SARS-CoV-2/FLU/RSV testing.  Fact Sheet for Patients: EntrepreneurPulse.com.au  Fact Sheet for Healthcare Providers: IncredibleEmployment.be  This test is not yet approved or cleared by the [place] FDA and has been authorized for detection and/or diagnosis of SARS-CoV-2 by FDA under an Emergency Use Authorization (EUA). This EUA will remain in effect (meaning this test can be used) for the duration of the COVID-19 declaration under Section 564(b)(1) of the Act, 21 U.S.C. section 360bbb-3(b)(1), unless the authorization is terminated or revoked.  Performed at [facility], [address], [place], [place] [address]      Labs: Basic Metabolic Panel: Recent Labs  Lab 10/26/21 0521 10/27/21 0508 10/28/21 0500 10/29/21 0413 10/30/21 0925  NA 129* 129* 131* 130* 131*  K 3.9 3.8 3.4* 3.5 4.4  CL 98 98 98 96* 96*  CO2 20* 21* 22 22 23   GLUCOSE 75 101* 79 41* 92  BUN 26* 26* 26* 24* 28*  CREATININE 0.75 0.79 0.79 0.70 0.62  CALCIUM 9.3 9.5 9.8 9.8 10.1   Liver Function Tests: Recent Labs  Lab 10/26/21 0521 10/27/21 0508 10/28/21 0500 10/29/21 0413  AST 74* 55* 58* 65*  ALT 33 34 30 34  ALKPHOS 437* 427* 444* 485*  BILITOT 7.5* 7.1* 7.2* 7.6*  PROT 4.9* 4.8* 4.9* 5.1*  ALBUMIN 2.6* 2.6* 2.7* 2.6*   No results for input(s): LIPASE, AMYLASE in the last 168 hours. Recent Labs  Lab 10/26/21  8241 10/27/21 0508  AMMONIA 43* 36*   CBC: Recent Labs  Lab 10/26/21 0521 10/27/21 0508 10/28/21 0500  WBC 101.8* 103.9*  --   NEUTROABS 94.7* 91.4*  --   HGB 9.9* 9.7* 9.6*  HCT 30.6* 29.2* 28.5*  MCV 89.7 89.6  --   PLT  78* 87*  --    Cardiac Enzymes: No results for input(s): CKTOTAL, CKMB, CKMBINDEX, TROPONINI in the last 168 hours. BNP: Invalid input(s): POCBNP CBG: Recent Labs  Lab 10/27/21 0721 10/27/21 1115 10/27/21 1612 10/27/21 2051 10/29/21 1040  GLUCAP 96 225* 156* 170* 232*    Time coordinating discharge:  36 minutes  Signed:  Orson Eva, DO [facility] Pager: [phone] 11/01/2021, 1:02 PM

## 2021-11-01 NOTE — Plan of Care (Signed)

## 2021-11-03 ENCOUNTER — Encounter (HOSPITAL_COMMUNITY): Payer: Managed Care, Other (non HMO)

## 2021-11-16 DEATH — deceased

## 2021-12-07 LAB — MISC LABCORP TEST (SEND OUT): Labcorp test code: 183526

## 2022-11-05 ENCOUNTER — Encounter (INDEPENDENT_AMBULATORY_CARE_PROVIDER_SITE_OTHER): Payer: Self-pay | Admitting: *Deleted

## 2022-11-13 IMAGING — DX DG HUMERUS 2V *R*
2 series · 2 of 2 positions shown · non-contrast
Comparison: None.

CLINICAL DATA: Acute RIGHT arm pain following fall 4 days ago.
Initial encounter.

EXAM:
RIGHT HUMERUS - 2+ VIEW

[humerus ap]
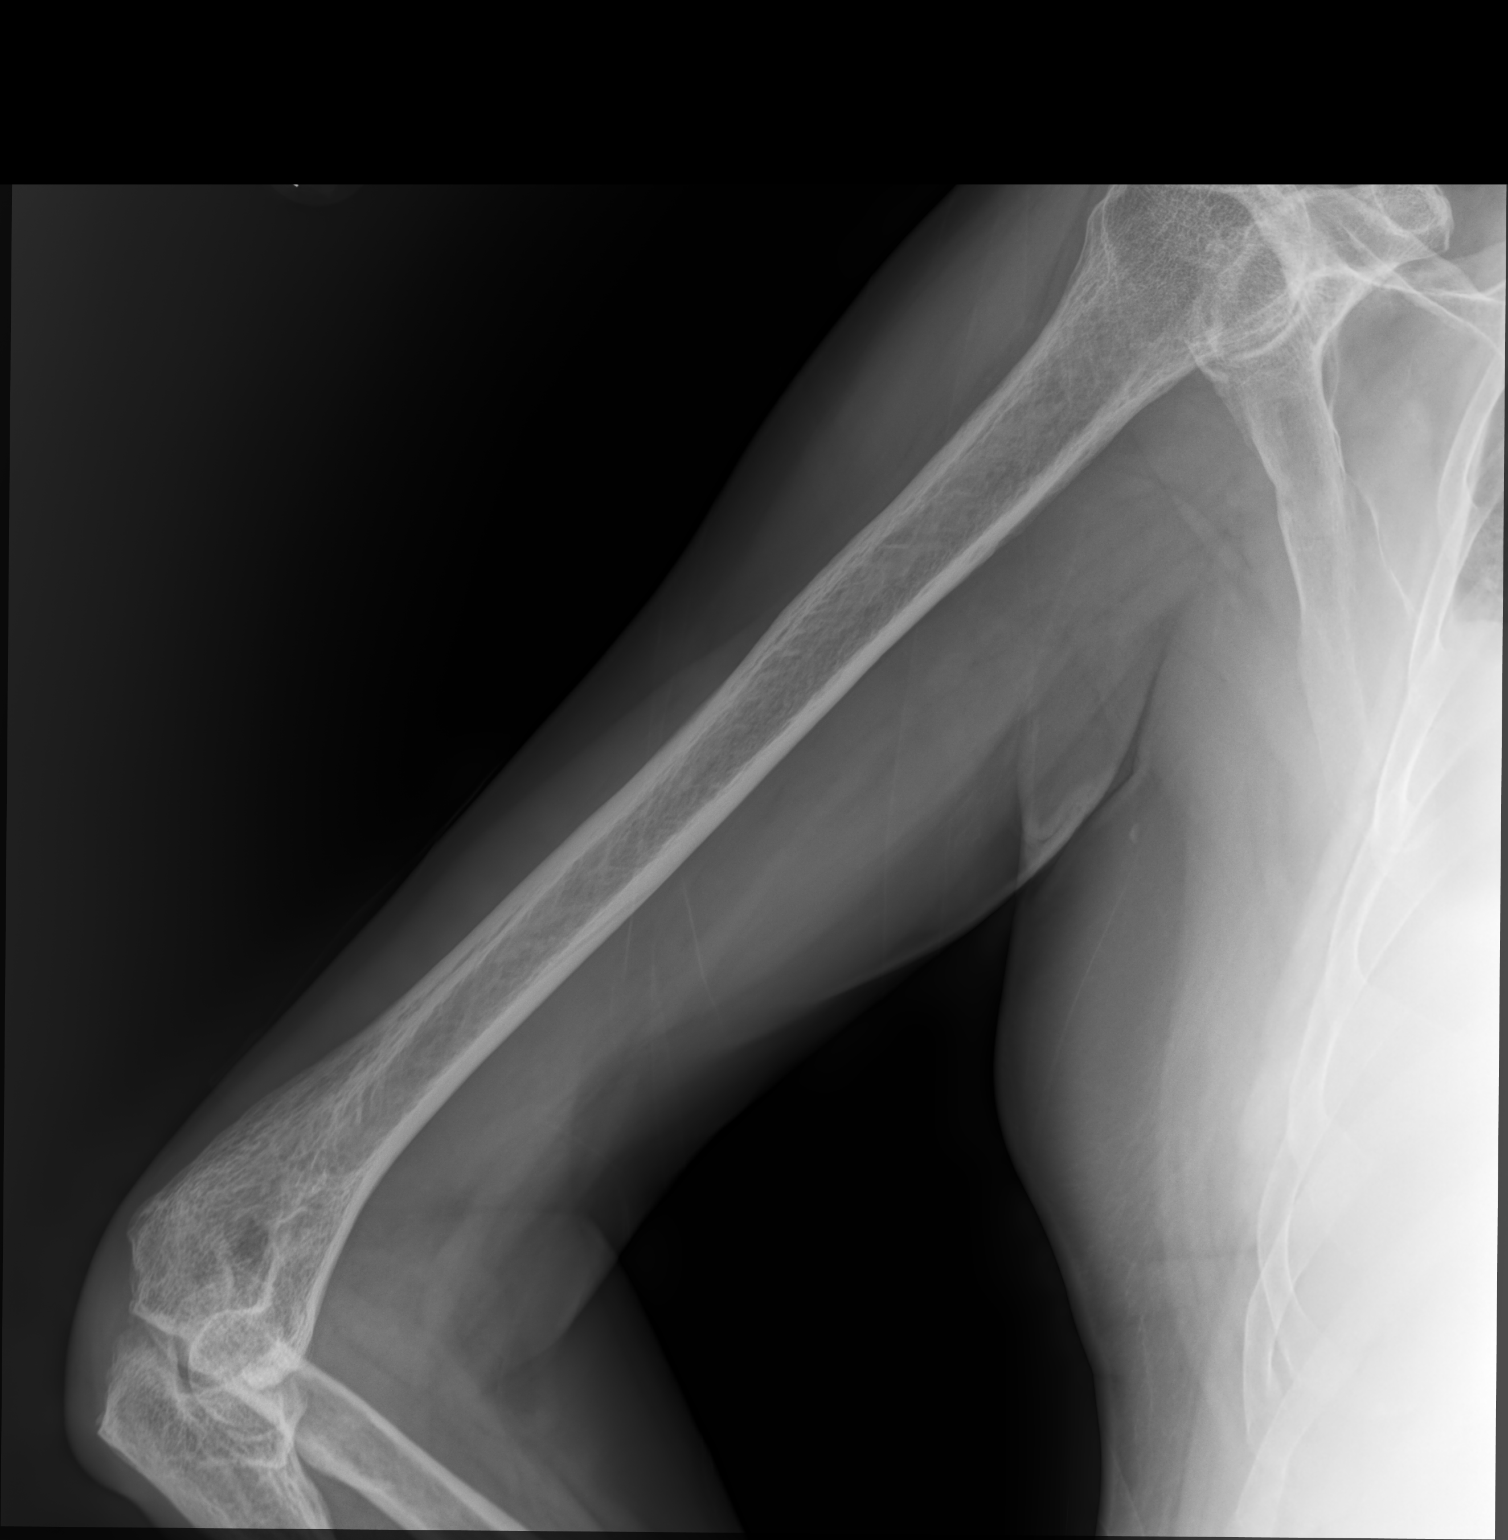

[humerus lat]
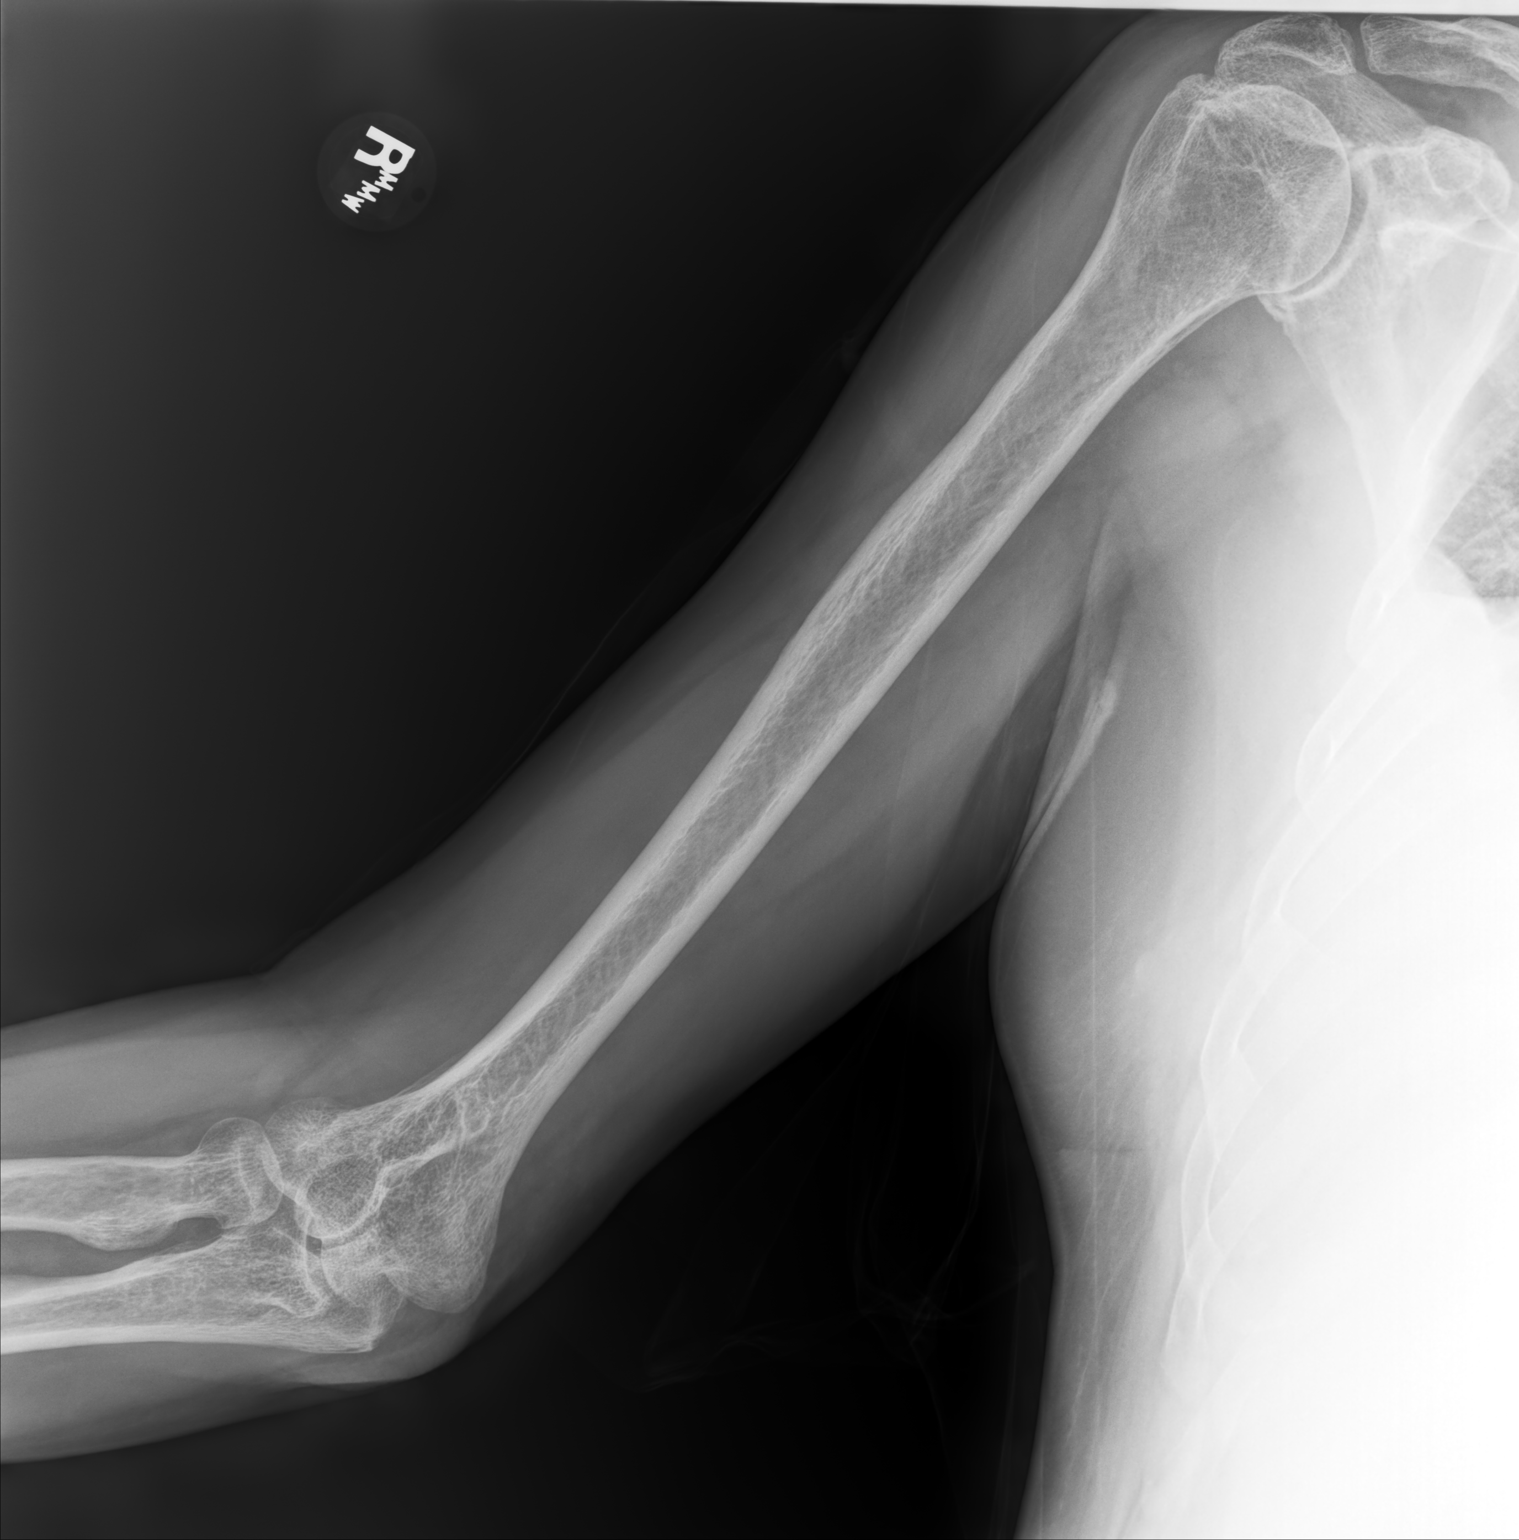

[2 of 2 positions shown; findings below may reference images not displayed]

FINDINGS: No fracture or dislocation identified.

Mild degenerative changes at the glenohumeral joint noted.

No focal bony lesions are present.
IMPRESSION: No acute abnormality.
